# Patient Record
Sex: Female | Born: 1955 | Race: White | Hispanic: No | Marital: Married
Health system: Midwestern US, Academic
[De-identification: ages and names within clinical notes are randomized; demographics above are authoritative.]

---

## 2016-10-30 MED ORDER — SODIUM CHLORIDE 0.9 % IV SOLP
INTRAVENOUS | 0 refills | Status: CN
Start: 2016-10-30 — End: ?

## 2016-10-30 MED ORDER — PEG-ELECTROLYTE SOLN 420 GRAM PO SOLR
0 refills | Status: DC
Start: 2016-10-30 — End: 2017-02-01

## 2016-11-01 ENCOUNTER — Ambulatory Visit: Admit: 2016-11-01 | Discharge: 2016-11-01 | Payer: MEDICARE

## 2016-11-01 DIAGNOSIS — D649 Anemia, unspecified: Principal | ICD-10-CM

## 2016-11-04 ENCOUNTER — Encounter: Admit: 2016-11-04 | Discharge: 2016-11-04 | Payer: MEDICARE

## 2016-11-04 NOTE — Telephone Encounter
Received VM from [place].     Pt scheduled for Capsule endoscopy 11/06/16 and does not have any insurance or money to pay for procedure. Financial office requesting to know if procedure is emergent/urgent in order to proceed.     Routing to Dr. Caesar Chestnut to advise.

## 2016-11-05 NOTE — Telephone Encounter
Capsule endoscopy could be postponed to the next 3 months.

## 2016-11-05 NOTE — Telephone Encounter
Received notice from financial services stating pt will need to cancel/reschedule. Cancellation form sent to endoscopy lab.     Spoke to pt who states she does not have the money to get insurance at this time. Transferred pt to IM Social Worker to help further assist. Advised pt to contact our office with any questions/concerns and if symptomatic.           From: Susa Day   Sent: Tuesday, November 05, 2016 10:10 AM  To: Despina Hick  Subject: RE: MRN [id number]    Patient is unable to pay for testing tomorrow. We will have to have her cancelled or rescheduled until she has active insurance or can make payment arrangements. Thanks again for all your assistance!    Albertina Senegal   Music therapist  Patient Financial Services  [facility]  Office: [phone]  FAX: [phone]  e-mail: [email or web address]

## 2016-11-05 NOTE — Telephone Encounter
Message sent to financial office.

## 2016-11-06 ENCOUNTER — Encounter: Admit: 2016-11-06 | Discharge: 2016-11-06 | Payer: MEDICARE

## 2016-11-06 NOTE — Telephone Encounter
11-05-16  VM received from pt requesting a return call to discuss asst.  Pt states she's uninsured.    11-06-16  Return call placed.  Pt reports being uninsured and needs a capsule test but cannot afford the $2,000 required for this.  This worker inquired about work/disability options.  Pt confirms receiving disability and states she's not eligible for Medicaid.  Pt later states she declined Medicaid due to the spend down being too high ($8,000 to $10,000) and they could not afford to pay this.  This worker briefly explained the spend down process to pt and encouraged she reapply for Medicaid in hopes this will help her in receiving the required tests.  Pt reluctantly accepted this worker mailing her a Medicaid application to reapply.  Agreed.  This worker inquired pt about her Medicare eligibility date.  Pt states she's not sure when she'd be Medicare eligible and this worker encouraged pt to contact [employer] for an exact date.  Agreed.  This worker also encouraged pt to think about purchasing insurance through the [employer] however pt denied being able to afford this as well.  No additional needs.  Call concluded.    [place] Medicaid application, billing/financial counselor contact info, and [employer] info mailed to pt for asst as needed.

## 2016-11-07 ENCOUNTER — Encounter: Admit: 2016-11-07 | Discharge: 2016-11-07 | Payer: MEDICARE

## 2016-11-07 DIAGNOSIS — R112 Nausea with vomiting, unspecified: ICD-10-CM

## 2016-11-07 DIAGNOSIS — D509 Iron deficiency anemia, unspecified: Principal | ICD-10-CM

## 2016-11-07 LAB — FOLATE, SERUM: Lab: 17 MMOL/L (ref 21–30)

## 2016-11-18 ENCOUNTER — Encounter: Admit: 2016-11-18 | Discharge: 2016-11-18 | Payer: MEDICARE

## 2016-11-18 DIAGNOSIS — D509 Iron deficiency anemia, unspecified: Principal | ICD-10-CM

## 2016-11-18 DIAGNOSIS — R112 Nausea with vomiting, unspecified: ICD-10-CM

## 2016-11-29 ENCOUNTER — Encounter: Admit: 2016-11-29 | Discharge: 2016-11-29 | Payer: MEDICARE

## 2016-11-29 NOTE — Telephone Encounter
Contacted pt on information below. Pt verbalized understanding. Pt requested this information be sent to her PCP, Dr. Nonah Mattes. On review, these results have already be sent to PCP on 11/05/16. Pt also requesting ordered diagnostic test to be sent to PCP due to financial hardship. See telephone encounter 11/06/16.  Per pt, PCP has more resources to help pay. Last office visit 50-30-18 faxed to PCP office for review. Pt had no further questions or concerns.       Diane Blase, MD sent to Diane Savage            Normal folate, B12 and negative tTG (no celiac disease).

## 2017-01-31 DIAGNOSIS — I214 Non-ST elevation (NSTEMI) myocardial infarction: Secondary | ICD-10-CM

## 2017-01-31 DIAGNOSIS — J9602 Acute respiratory failure with hypercapnia: Secondary | ICD-10-CM

## 2017-01-31 MED ORDER — INSULIN ASPART 100 UNIT/ML SC FLEXPEN
0-7 [IU] | Freq: Before meals | SUBCUTANEOUS | 0 refills | Status: DC
Start: 2017-01-31 — End: 2017-02-03
  Administered 2017-02-01: 02:00:00 2 [IU] via SUBCUTANEOUS

## 2017-01-31 MED ORDER — PANTOPRAZOLE 40 MG IV SOLR
80 mg | Freq: Two times a day (BID) | INTRAVENOUS | 0 refills | Status: DC
Start: 2017-01-31 — End: 2017-02-06
  Administered 2017-02-01 – 2017-02-06 (×10): 80 mg via INTRAVENOUS

## 2017-01-31 MED ORDER — VANCOMYCIN 1,500 MG IVPB
1500 mg | INTRAVENOUS | 0 refills | Status: DC
Start: 2017-01-31 — End: 2017-02-01

## 2017-01-31 MED ORDER — PIPERACILLIN-TAZOBACTAM-DEXTRS 3.375 GRAM/50 ML IV PGBK
3.375 g | INTRAVENOUS | 0 refills | Status: DC
Start: 2017-01-31 — End: 2017-02-02
  Administered 2017-02-01 – 2017-02-02 (×6): 3.375 g via INTRAVENOUS

## 2017-01-31 MED ORDER — DOXYCYCLINE HYCLATE 100 MG PO TAB
100 mg | Freq: Two times a day (BID) | ORAL | 0 refills | Status: DC
Start: 2017-01-31 — End: 2017-02-01
  Administered 2017-02-01 (×2): 100 mg via ORAL

## 2017-01-31 MED ORDER — FUROSEMIDE 10 MG/ML IJ SOLN
40 mg | Freq: Once | INTRAVENOUS | 0 refills | Status: CP
Start: 2017-01-31 — End: ?
  Administered 2017-02-01: 07:00:00 40 mg via INTRAVENOUS

## 2017-01-31 MED ORDER — VANCOMYCIN 1,750 MG IVPB
1750 mg | INTRAVENOUS | 0 refills | Status: DC
Start: 2017-01-31 — End: 2017-02-01
  Administered 2017-02-01 (×2): 1750 mg via INTRAVENOUS

## 2017-01-31 MED ORDER — NITROGLYCERIN IN 5 % DEXTROSE 50 MG/250 ML (200 MCG/ML) IV SOLN
0.1-3 ug/kg/min | INTRAVENOUS | 0 refills | Status: DC
Start: 2017-01-31 — End: 2017-02-04

## 2017-01-31 MED ORDER — VANCOMYCIN PHARMACY TO MANAGE
1 | 0 refills | Status: DC
Start: 2017-01-31 — End: 2017-02-01

## 2017-01-31 MED ORDER — SODIUM PHOSPHATE IVPB
8 MMOL | Freq: Once | INTRAVENOUS | 0 refills | Status: CP
Start: 2017-01-31 — End: ?
  Administered 2017-02-01 (×2): 8 mmol via INTRAVENOUS

## 2017-01-31 MED ORDER — HEPARIN, PORCINE (PF) 5,000 UNIT/0.5 ML IJ SYRG
5000 [IU] | SUBCUTANEOUS | 0 refills | Status: DC
Start: 2017-01-31 — End: 2017-02-01

## 2017-01-31 MED ORDER — AMIODARONE 200 MG PO TAB
400 mg | Freq: Two times a day (BID) | ORAL | 0 refills | Status: DC
Start: 2017-01-31 — End: 2017-02-01
  Administered 2017-02-01: 06:00:00 400 mg via ORAL

## 2017-02-01 ENCOUNTER — Encounter: Admit: 2017-02-01 | Discharge: 2017-02-01 | Payer: MEDICARE

## 2017-02-01 DIAGNOSIS — I10 Essential (primary) hypertension: ICD-10-CM

## 2017-02-01 DIAGNOSIS — M1990 Unspecified osteoarthritis, unspecified site: ICD-10-CM

## 2017-02-01 DIAGNOSIS — I2510 Atherosclerotic heart disease of native coronary artery without angina pectoris: Principal | ICD-10-CM

## 2017-02-01 DIAGNOSIS — E119 Type 2 diabetes mellitus without complications: ICD-10-CM

## 2017-02-01 DIAGNOSIS — K319 Disease of stomach and duodenum, unspecified: ICD-10-CM

## 2017-02-01 DIAGNOSIS — I214 Non-ST elevation (NSTEMI) myocardial infarction: Secondary | ICD-10-CM

## 2017-02-01 DIAGNOSIS — D699 Hemorrhagic condition, unspecified: ICD-10-CM

## 2017-02-01 DIAGNOSIS — E039 Hypothyroidism, unspecified: ICD-10-CM

## 2017-02-01 DIAGNOSIS — M549 Dorsalgia, unspecified: ICD-10-CM

## 2017-02-01 DIAGNOSIS — H547 Unspecified visual loss: ICD-10-CM

## 2017-02-01 LAB — CBC
Lab: 160 K/UL (ref 150–400)
Lab: 17 % — ABNORMAL HIGH (ref 11–15)
Lab: 26 % — ABNORMAL LOW (ref >60)
Lab: 30 pg — ABNORMAL LOW (ref 26–34)
Lab: 32 g/dL — ABNORMAL HIGH (ref 32.0–36.0)
Lab: 7.9 FL (ref 7–11)
Lab: 8.1 K/UL — ABNORMAL LOW (ref 4.5–11.0)
Lab: 8.4 g/dL — ABNORMAL LOW (ref 12.0–15.0)
Lab: 94 FL (ref >60)

## 2017-02-01 LAB — POC GLUCOSE
Lab: 234 mg/dL — ABNORMAL HIGH (ref 70–100)
Lab: 186 mg/dL — ABNORMAL HIGH (ref 70–100)
Lab: 221 mg/dL — ABNORMAL HIGH (ref 70–100)
Lab: 376 mg/dL — ABNORMAL HIGH (ref 70–100)

## 2017-02-01 LAB — CREATININE-URINE RANDOM: Lab: 58 mg/dL

## 2017-02-01 LAB — COMPREHENSIVE METABOLIC PANEL
Lab: 130 MMOL/L — ABNORMAL LOW (ref 137–147)
Lab: 24 MMOL/L (ref 21–30)
Lab: 26 U/L (ref 7–40)
Lab: 29 mL/min — ABNORMAL LOW (ref >60)
Lab: 3.2 g/dL — ABNORMAL LOW (ref 3.5–5.0)
Lab: 35 mL/min — ABNORMAL LOW (ref >60)
Lab: 54 U/L (ref 25–110)
Lab: 6 K/UL — ABNORMAL HIGH (ref 3–12)
Lab: 8.9 mg/dL (ref 8.5–10.6)
Lab: 9 U/L (ref 7–56)
Lab: 133 MMOL/L — ABNORMAL LOW (ref 137–147)

## 2017-02-01 LAB — URINALYSIS MICROSCOPIC REFLEX TO CULTURE

## 2017-02-01 LAB — BLOOD GASES, CENTRAL VENOUS
Lab: 1 MMOL/L
Lab: 25 MMOL/L
Lab: 49 mmHg (ref 40–50)
Lab: 52 mmHg (ref >40)
Lab: 7.3 (ref 7.30–7.40)
Lab: 84 % — ABNORMAL HIGH (ref 65–75)

## 2017-02-01 LAB — HEMOGLOBIN A1C: Lab: 7.1 % — ABNORMAL HIGH (ref 4.0–6.0)

## 2017-02-01 LAB — TROPONIN-I
Lab: 0.7 ng/mL — ABNORMAL HIGH (ref 0.0–0.05)
Lab: 0.8 ng/mL — ABNORMAL HIGH (ref 0.0–0.05)
Lab: 0.9 ng/mL — ABNORMAL HIGH (ref 0.0–0.05)
Lab: 0.9 ng/mL — ABNORMAL HIGH (ref 0.0–0.05)
Lab: 0.9 ng/mL — ABNORMAL HIGH (ref 0.0–0.05)

## 2017-02-01 LAB — LIPID PROFILE: Lab: 169 mg/dL — ABNORMAL LOW (ref <200)

## 2017-02-01 LAB — MAGNESIUM
Lab: 2 mg/dL (ref 1.6–2.6)
Lab: 1.8 mg/dL — ABNORMAL LOW (ref 1.6–2.6)

## 2017-02-01 LAB — LACTIC ACID(LACTATE): Lab: 1.3 MMOL/L (ref 0.5–2.0)

## 2017-02-01 LAB — BLOOD GASES, PERIPHERAL VENOUS
Lab: 0.3 MMOL/L
Lab: 24 MMOL/L
Lab: 36 mmHg (ref 33–48)
Lab: 61 % (ref 55–71)
Lab: 64 mmHg — ABNORMAL HIGH (ref 36–50)
Lab: 7.2 — ABNORMAL LOW (ref 7.30–7.40)

## 2017-02-01 LAB — PTT (APTT)
Lab: 24 s — ABNORMAL LOW (ref 20.0–36.0)
Lab: 26 s (ref 20.0–36.0)

## 2017-02-01 LAB — PHOSPHORUS
Lab: 1.8 mg/dL — ABNORMAL LOW (ref 2.0–4.0)
Lab: 1.8 mg/dL — ABNORMAL LOW (ref 2.0–4.0)

## 2017-02-01 LAB — CBC AND DIFF
Lab: 8.9 10*3/uL (ref 4.5–11.0)
Lab: 7.9 K/UL — ABNORMAL HIGH (ref >60)

## 2017-02-01 LAB — URINALYSIS DIPSTICK REFLEX TO CULTURE

## 2017-02-01 LAB — UREA NITROGEN-URINE RANDOM: Lab: 159 mg/dL

## 2017-02-01 LAB — LACTIC ACID (BG - RAPID LACTATE): Lab: 1.5 MMOL/L (ref 0.5–2.0)

## 2017-02-01 LAB — FREE T4-FREE THYROXINE: Lab: 1.3 ng/dL (ref 0.6–1.6)

## 2017-02-01 LAB — BNP (B-TYPE NATRIURETIC PEPTI): Lab: 117 pg/mL — ABNORMAL HIGH (ref 0–100)

## 2017-02-01 LAB — TSH WITH FREE T4 REFLEX: Lab: 0.2 uU/mL — ABNORMAL LOW (ref 0.35–5.00)

## 2017-02-01 LAB — PROTIME INR (PT): Lab: 1.2 M/UL — ABNORMAL LOW (ref 0.8–1.2)

## 2017-02-01 MED ORDER — ACETAMINOPHEN 325 MG PO TAB
650 mg | Freq: Once | ORAL | 0 refills | Status: CP
Start: 2017-02-01 — End: ?
  Administered 2017-02-01: 10:00:00 650 mg via ORAL

## 2017-02-01 MED ORDER — DIGOXIN 250 MCG/ML IJ SOLN
250 ug | Freq: Once | INTRAVENOUS | 0 refills | Status: CP
Start: 2017-02-01 — End: ?
  Administered 2017-02-01: 12:00:00 250 ug via INTRAVENOUS

## 2017-02-01 MED ORDER — AMIODARONE 150 MG IN D5W 100 ML IVPB LOAD
150 mg | Freq: Once | INTRAVENOUS | 0 refills | Status: CP
Start: 2017-02-01 — End: ?
  Administered 2017-02-01 (×2): 150 mg via INTRAVENOUS

## 2017-02-01 MED ORDER — FUROSEMIDE 10 MG/ML IJ SOLN
40 mg | Freq: Once | INTRAVENOUS | 0 refills | Status: CP
Start: 2017-02-01 — End: ?
  Administered 2017-02-01: 16:00:00 40 mg via INTRAVENOUS

## 2017-02-01 MED ORDER — PNEUMOCOCCAL 23-VAL PS VACCINE 25 MCG/0.5 ML IJ SOLN
.5 mL | Freq: Once | INTRAMUSCULAR | 0 refills | Status: DC
Start: 2017-02-01 — End: 2017-02-04

## 2017-02-01 MED ORDER — ONDANSETRON HCL 4 MG PO TAB
4 mg | ORAL | 0 refills | Status: DC | PRN
Start: 2017-02-01 — End: 2017-02-04
  Administered 2017-02-02: 04:00:00 4 mg via ORAL

## 2017-02-01 MED ORDER — FUROSEMIDE IV DRIP SYR (MAX CONC)
5-40 mg/h | INTRAVENOUS | 0 refills | Status: DC
Start: 2017-02-01 — End: 2017-02-01
  Administered 2017-02-01: 09:00:00 5 mg/h via INTRAVENOUS

## 2017-02-01 MED ORDER — MAGNESIUM SULFATE IN D5W 1 GRAM/100 ML IV PGBK
1 g | Freq: Once | INTRAVENOUS | 0 refills | Status: CP
Start: 2017-02-01 — End: ?
  Administered 2017-02-01: 14:00:00 1 g via INTRAVENOUS

## 2017-02-01 MED ORDER — ASPIRIN 81 MG PO CHEW
81 mg | Freq: Every day | ORAL | 0 refills | Status: DC
Start: 2017-02-01 — End: 2017-02-04
  Administered 2017-02-01 – 2017-02-04 (×4): 81 mg via ORAL

## 2017-02-01 MED ORDER — HEPARIN (PORCINE) IN 5 % DEX 20,000 UNIT/500 ML (40 UNIT/ML) IV SOLP
0-2000 [IU]/h | INTRAVENOUS | 0 refills | Status: DC
Start: 2017-02-01 — End: 2017-02-02
  Administered 2017-02-01: 11:00:00 800 [IU]/h via INTRAVENOUS
  Administered 2017-02-02: 11:00:00 1000 [IU]/h via INTRAVENOUS

## 2017-02-01 MED ORDER — IMS MIXTURE TEMPLATE
175 ug | Freq: Every day | ORAL | 0 refills | Status: DC
Start: 2017-02-01 — End: 2017-02-16
  Administered 2017-02-02 – 2017-02-15 (×19): 175 ug via ORAL

## 2017-02-01 MED ORDER — ROSUVASTATIN 20 MG PO TAB
20 mg | Freq: Every day | ORAL | 0 refills | Status: DC
Start: 2017-02-01 — End: 2017-02-03
  Administered 2017-02-01 – 2017-02-03 (×2): 20 mg via ORAL

## 2017-02-01 MED ORDER — ACETAMINOPHEN 325 MG PO TAB
650 mg | ORAL | 0 refills | Status: DC | PRN
Start: 2017-02-01 — End: 2017-02-04
  Administered 2017-02-02: 02:00:00 650 mg via ORAL

## 2017-02-01 MED ORDER — METOPROLOL TARTRATE 5 MG/5 ML IV SOLN
5 mg | Freq: Once | INTRAVENOUS | 0 refills | Status: CP
Start: 2017-02-01 — End: ?

## 2017-02-01 MED ORDER — LEVOTHYROXINE 75 MCG PO TAB
225 ug | Freq: Every day | ORAL | 0 refills | Status: DC
Start: 2017-02-01 — End: 2017-02-01
  Administered 2017-02-01: 12:00:00 225 ug via ORAL

## 2017-02-01 MED ORDER — PERFLUTREN LIPID MICROSPHERES 1.1 MG/ML IV SUSP
1-20 mL | Freq: Once | INTRAVENOUS | 0 refills | Status: CP
Start: 2017-02-01 — End: ?
  Administered 2017-02-01: 17:00:00 2 mL via INTRAVENOUS

## 2017-02-01 MED ORDER — ACETAMINOPHEN 500 MG PO TAB
500 mg | Freq: Once | ORAL | 0 refills | Status: CP
Start: 2017-02-01 — End: ?
  Administered 2017-02-01: 12:00:00 500 mg via ORAL

## 2017-02-01 MED ORDER — AMIODARONE IN DEXTROSE,ISO-OSM 360 MG/200 ML (1.8 MG/ML) IV SOLN
0.5-1 mg/min | INTRAVENOUS | 0 refills | Status: DC
Start: 2017-02-01 — End: 2017-02-03
  Administered 2017-02-01 (×2): 1 mg/min via INTRAVENOUS
  Administered 2017-02-02 – 2017-02-03 (×3): 0.5 mg/min via INTRAVENOUS

## 2017-02-01 MED ORDER — PEG-ELECTROLYTE SOLN 420 GRAM PO SOLR
2 L | Freq: Once | ORAL | 0 refills | Status: CP
Start: 2017-02-01 — End: ?
  Administered 2017-02-02: 04:00:00 2 L via ORAL

## 2017-02-01 MED ADMIN — METOPROLOL TARTRATE 5 MG/5 ML IV SOLN [5007]: 5 mg | INTRAVENOUS | @ 11:00:00 | Stop: 2017-02-01 | NDC 47781058720

## 2017-02-01 NOTE — H&P (View-Only)
Admission History and Physical Examination      Name:  Diane Savage                                             MRN:  [id number]   Admission Date:  01/31/2017                     Assessment/Plan:    Active Problems:    NSTEMI (non-ST elevated myocardial infarction) (HCC)    Type 2 diabetes mellitus with circulatory disorder, without long-term current use of insulin (HCC)    Atrial fibrillation (HCC)    Atrial fibrillation with rapid ventricular response (HCC)    Chronic gastrointestinal bleeding    Transfusion-dependent anemia    61 yoF with a PMH paroxysmal atrial fibrillation not on AC, HTN, HLD, NIDDM, Chronic blood loss anemia, transfusion dependent anemia, CAD s/p PCI w DES ?year, hypothyroidism, presented [facility] with c/o chest pain found Hgb ~6, with positive troponin and concern for NSTEMI 2/2 demand ischemia from chronic CAD    #NSTEMI, likely demand ischemia due to anemia and existing CAD  #CAD with hx PCI with DES  #HLD  -presented 8/30 to [facility] with c/o chest pain; hgb at time of admission noted to be 6.4  -initial troponin found to be ~0.3, patient given ASA on admission, but no heparin gtt given hx GIB  -patient taken for [facility] 01/31/17 with findings of multi-vessel CAD: 80% distal left main, moderate LAD disease, LCx 80% ostial 70%; RCA with 50% lesion and distal 70% disease. Aortic pressure 104/52, LVp 109/28, LVEDP , normal LV function  -given severity/multi-vessel of disease, request was made for transfer to tertiary center for CABG evaluation  -on arrival patient on amiodarone gtt, heparin gtt, nitro gtt  PLAN  >heparin gtt  >continue nitro gtt / amiodarone gtt  >repeat trop q4h   >obtain EKG, BMP, Mg, Phos  >Echocardiogram  >CTS consultation    #Fever  #Somnolence  -on arrival, patient somnolent, and febrile to 101.6*f  PLAN  >UA, BCx2, CXR  >Lactic Acid  >VBG  >zosyn, doxy, vanc  >de-escalate abx if initial findings are negative #Acute Kidney Injury  -Cr at time of presentation 1.8  -Cr per review of records ~1.0  PLAN  >monitor with BMP with diuresis  >d/c vanc/zosyn when possible to prevent further injury    #Atrial fibrillation with RVR  -pt has history of a.fib, not on AC due to chronic GIB  -8/31 patient noted on telemetry to enter a.fib with RVR, HR ~150  -amiodarone gtt initiated, patient spontaneously converted to NSR  -CHADsVASc  PLAN  >heparin gtt, check cbc q8h while on drip  >continue amiodarone gtt     #Acute on Chronic Blood Loss Anemia  #Transfusion dependent anemia  -patient has extensive history of GI blood loss anemia, exacerbated after placed on Plavix and ASA following PCI w DES: reported BRBPR and Melena  -per records, patient has undergone capsule endoscopy with reported abnormal findings per chart review  -colonscopy per patient has been unrevealing  -hematology has seen in past: normal retic count, iron deficiency anemia  -has required 2units pRBC tranfusion every 2 weeks for ~18yr, patient reportedly awaiting port placement due to transfusion dependence  -at OSH: received 3 units pRBC with Hgb > 8.0  -Hgb at time of presentation 7.3  PLAN  >using AC with caution given NSTEMI and A.fib  >trend Hgb q8h. Transfuse to Hgb goal > 8  >antibody screen on blood - family reports difficulty finding a blood match because she has had so many transfusions  >consider hematology/pathology consult for Ab determination  >IV Pantoprazole 80mg  BID  >consulting gastroenterology    #Type 2 DM  >repeat A1c  >LDCF    Hypothyroidism  >Restart PTA synthroid    #FENI: no IVF, cardiac diet when awake  #PPx PPI, hold off on [facility] given GIB  #Dispo CV1 service, evaluation for CABG  #Code full    __________________________________________________________________________________  Primary Care Physician: Sherre Lain  Verified    Chief Complaint:    History of Present Illness: Diane Savage is a 61 y.o. female with PMH chronic diastolic HF, a.fib, T2DM, HTN, HLD, CAD s/p PCI w DES, hypothyroidism, presented to [facility] in [place], [place] on 8/29 with complaint of 2 weeks of off/on chest pain. When she awoke this AM pain was persistent and 10/10. Pain located in left chest with radiation towards neck. Patient reported to ED where they initially discovered that her hemoglobin was ~6. She was emergently transfused 2 units of pRBC and admitted for further evaluation. Initial troponin was negative, but repeat showed mild elevation to 0.35. She was given ASA, but no heparin given her extensive history of GI Bleeding and anemia. Cardiology was consulted while inpatient and they recommended LHC, given the possibility of plaque rupture. LHC showed areas of no more than 80% stenosis, which led to the suspicion of demand ischemia from anemia. While in [facility], patient was bolused with heparin. Her hospital course was complicated by A.fib with RVR, which was treated with amiodarone. Telemetry reports that were brought with the patient showed HR ~150. She was transferred to [facility] for CABG evaluation.    At time of arrival patient was on heparin gtt, nitro gtt, and amiodarone gtt. She was somnolent and difficult to rouse. With gentle sternal rub, patient could briefly open her eyes for around 4 seconds. She was unable to relay her pertinent past medical history. She was able to deny chest pain. Review of records show administration of narcotics and benzos PTA.    Family was present at bedside and was able to relay some of her history. They report that she has had problems with GIB for over 46yr. They state that she has required 2 units of blood every other week. There were plans to place a port on the patient to made administration easier. Her anemia has been worked up with capsule studies, colonscopies, but the family reports no definitive cause was provided to them.        Past Medical History:   Diagnosis Date ??? Acquired hypothyroidism    ??? Arthritis    ??? Back pain    ??? Bleeding disorder (HCC)    ??? Coronary artery disease    ??? Diabetes mellitus (HCC)     Type II   ??? Hypertension    ??? Stomach disorder    ??? Vision decreased      Past Surgical History:   Procedure Laterality Date   ??? HX HEART CATHETERIZATION  2017   ??? CORONARY STENT PLACEMENT  2017   ??? ANKLE SURGERY      Left ankle reconstruction   ??? COLONOSCOPY     ??? HX KNEE ARTHROSCOPY Left    ??? HX TONSIL AND ADENOIDECTOMY     ??? TUBAL LIGATION     ???  UPPER GASTROINTESTINAL ENDOSCOPY       Family History   Problem Relation Age of Onset   ??? Diabetes Mother    ??? Heart Disease Mother    ??? High Cholesterol Mother    ??? Arthritis-rheumatoid Mother    ??? Stroke Mother    ??? Thyroid Disease Mother    ??? Coronary Artery Disease Mother    ??? Hyperlipidemia Mother    ??? Cancer Father    ??? Cancer-Breast Sister    ??? Cancer Sister    ??? Hypertension Sister    ??? Heart Disease Sister    ??? High Cholesterol Sister    ??? Arthritis-osteo Sister    ??? Migraines Sister    ??? Rashes/Skin Problems Sister    ??? Depression Sister    ??? Hyperlipidemia Sister    ??? Cancer Brother    ??? Hypertension Brother    ??? Arthritis-rheumatoid Brother    ??? Rashes/Skin Problems Brother    ??? Coronary Artery Disease Brother    ??? Heart Disease Maternal Aunt    ??? Coronary Artery Disease Paternal Uncle    ??? Hyperlipidemia Paternal Uncle    ??? Cancer Maternal Grandmother    ??? Diabetes Maternal Grandmother    ??? Heart Disease Maternal Grandmother    ??? Cancer-Colon Paternal Grandmother    ??? Cancer Paternal Grandmother    ??? Diabetes Paternal Grandmother    ??? Cancer Paternal Grandfather      Social History     Social History   ??? Marital status: Married     Spouse name: N/A   ??? Number of children: N/A   ??? Years of education: N/A     Social History Main Topics   ??? Smoking status: Former Smoker     Packs/day: 2.00     Years: 10.00     Types: Cigarettes     Quit date: 09/16/1980   ??? Smokeless tobacco: Never Used   ??? Alcohol use Yes Comment: Seldom   ??? Drug use: No   ??? Sexual activity: Not on file     Other Topics Concern   ??? Not on file     Social History Narrative   ??? No narrative on file      Immunizations (includes history and patient reported):   There is no immunization history on file for this patient.        Allergies:  Diltiazem; Sulfa (sulfonamide antibiotics); Duricef [cefadroxil]; Pravastatin; and Simvastatin    Medications:  Prescriptions Prior to Admission   Medication Sig   ??? CETIRIZINE HCL (ZYRTEC PO) Take 1 tablet by mouth as Needed.   ??? electrolyte GUT PEG (NULYTELY, COLYTE, GAVILYTE-N) 420 gram oral solution 2000 mls as directed for capsule endoscopy   ??? enalapril (VASOTEC) 5 mg tablet Take 5 mg by mouth daily.   ??? folic acid (FOLVITE) 1 mg tablet Take 1 mg by mouth daily.   ??? furosemide (LASIX) 20 mg tablet Take 20 mg by mouth every morning.   ??? glimepiride (AMARYL) 2 mg tablet Take 2 mg by mouth twice daily.   ??? GLUCOSAMINE HCL/CHONDROITIN SU (GLUCOSAMINE-CHONDROITIN PO) Take 1 tablet by mouth twice daily.   ??? levothyroxine (SYNTHROID) 200 mcg tablet Take 200 mcg by mouth daily 30 minutes before breakfast.   ??? levothyroxine (SYNTHROID) 25 mcg tablet Take 25 mcg by mouth daily 30 minutes before breakfast.   ??? LOPERAMIDE HCL (ANTI-DIARRHEA PO) Take 1 tablet by mouth as Needed.   ??? metFORMIN (GLUCOPHAGE) 500  mg tablet Take 1,000 mg by mouth twice daily with meals.   ??? metoprolol XL (TOPROL XL) 100 mg extended release tablet Take 100 mg by mouth twice daily.   ??? nitroglycerin (NITROSTAT) 0.4 mg tablet Place 0.4 mg under tongue every 5 minutes as needed for Chest Pain. Max of 3 tablets, call 911.   ??? omeprazole DR(+) (PRILOSEC) 40 mg capsule Take 40 mg by mouth twice daily.   ??? ondansetron (ZOFRAN) 8 mg tablet Take 8 mg by mouth every 8 hours as needed for Nausea or Vomiting.   ??? promethazine (PHENERGAN) 25 mg tablet Take 25 mg by mouth every 6 hours as needed for Nausea or Vomiting. ??? sucralfate (CARAFATE) 1 gram tablet Take 1 g by mouth four times daily. Take on an empty stomach.   ??? traMADol (ULTRAM) 50 mg tablet Take 50 mg by mouth every 6 hours as needed for Pain.     Review of Systems:  Review of systems not obtained from patient due to patient factors.      Physical Exam:  Vital Signs: Last Filed In 24 Hours Vital Signs: 24 Hour Range   BP: 110/52 (08/31 1928)  Temp: 38.7 ???C (101.6 ???F) (08/31 1924)  Pulse: 96 (08/31 1928)  Respirations: 18 PER MINUTE (08/31 1928)  SpO2: 99 % (08/31 1928)  O2 Delivery: Nasal Cannula (08/31 1928)  SpO2 Pulse: 97 (08/31 1928)  Height: 162.6 cm (64) (08/31 1944) BP: (110-131)/(35-52)   Temp:  [38.7 ???C (101.6 ???F)]   Pulse:  [96-97]   Respirations:  [13 PER MINUTE-18 PER MINUTE]   SpO2:  [98 %-99 %]   O2 Delivery: Nasal Cannula          Physical Exam   Constitutional: Vital signs are normal. She appears to not be writhing in pain. She has a sickly appearance.   Somnolent and difficult to rouse   HENT:   Head: Normocephalic and atraumatic.   Mouth/Throat: No oropharyngeal exudate.   Eyes: EOM are normal. Pupils are equal, round, and reactive to light. No scleral icterus.   Neck: No tracheal deviation present.   Plethoric neck with extra skin   Cardiovascular: Normal rate and regular rhythm.  Exam reveals no gallop.    Murmur heard.  Pulmonary/Chest: Effort normal. She exhibits no tenderness.   Abdominal: Soft. Bowel sounds are normal. She exhibits no distension. There is tenderness.   Musculoskeletal: She exhibits edema.   BLLE 1/2+ edema  BUE 1+ edema   Skin: Skin is warm and dry. No rash noted. No erythema.     Update to physical exam once patient awoke:  Constitutional: no distress  Psych: affect and judgement nl  Neuro: alert, oriented x3; no cranial nerve deficits  Abd: not tender to palpation      Lab/Radiology/Other Diagnostic Tests:  24-hour labs:    Results for orders placed or performed during the hospital encounter of 01/31/17 (from the past 24 hour(s))   CBC AND DIFF    Collection Time: 01/31/17  8:18 PM   Result Value Ref Range    White Blood Cells 8.9 4.5 - 11.0 K/UL    RBC 2.42 (L) 4.0 - 5.0 M/UL    Hemoglobin 7.3 (L) 12.0 - 15.0 GM/DL    Hematocrit 16.1 (L) 36 - 45 %    MCV 93.1 80 - 100 FL    MCH 30.0 26 - 34 PG    MCHC 32.2 32.0 - 36.0 G/DL    RDW 09.6 (H) 11 -  15 %    Platelet Count 164 150 - 400 K/UL    MPV 8.1 7 - 11 FL    Neutrophils 75 41 - 77 %    Lymphocytes 12 (L) 24 - 44 %    Monocytes 11 4 - 12 %    Eosinophils 1 0 - 5 %    Basophils 1 0 - 2 %    Absolute Neutrophil Count 6.70 1.8 - 7.0 K/UL    Absolute Lymph Count 1.00 1.0 - 4.8 K/UL    Absolute Monocyte Count 1.00 (H) 0 - 0.80 K/UL    Absolute Eosinophil Count 0.10 0 - 0.45 K/UL    Absolute Basophil Count 0.00 0 - 0.20 K/UL   PROTIME INR (PT)    Collection Time: 01/31/17  8:18 PM   Result Value Ref Range    INR 1.2 0.8 - 1.2   PTT (APTT)    Collection Time: 01/31/17  8:18 PM   Result Value Ref Range    APTT 24.9 20.0 - 36.0 SEC   COMPREHENSIVE METABOLIC PANEL    Collection Time: 01/31/17  8:18 PM   Result Value Ref Range    Sodium 130 (L) 137 - 147 MMOL/L    Potassium 4.3 3.5 - 5.1 MMOL/L    Chloride 100 98 - 110 MMOL/L    Glucose 199 (H) 70 - 100 MG/DL    Blood Urea Nitrogen 20 7 - 25 MG/DL    Creatinine 1.61 (H) 0.4 - 1.00 MG/DL    Calcium 8.9 8.5 - 09.6 MG/DL    Total Protein 6.4 6.0 - 8.0 G/DL    Total Bilirubin 0.8 0.3 - 1.2 MG/DL    Albumin 3.2 (L) 3.5 - 5.0 G/DL    Alk Phosphatase 54 25 - 110 U/L    AST (SGOT) 26 7 - 40 U/L    CO2 24 21 - 30 MMOL/L    ALT (SGPT) 9 7 - 56 U/L    Anion Gap 6 3 - 12    eGFR Non African American 29 (L) >60 mL/min    eGFR African American 35 (L) >60 mL/min   MAGNESIUM    Collection Time: 01/31/17  8:18 PM   Result Value Ref Range    Magnesium 2.0 1.6 - 2.6 mg/dL   PHOSPHORUS    Collection Time: 01/31/17  8:18 PM   Result Value Ref Range    Phosphorus 1.8 (L) 2.0 - 4.0 MG/DL   BNP (B-TYPE NATRIURETIC PEPTI) Collection Time: 01/31/17  8:18 PM   Result Value Ref Range    B Type Natriuretic Peptide 1,178.0 (H) 0 - 100 PG/ML   TROPONIN-I    Collection Time: 01/31/17  8:18 PM   Result Value Ref Range    Troponin-I 0.76 (H) 0.0 - 0.05 NG/ML   TSH WITH FREE T4 REFLEX    Collection Time: 01/31/17  8:18 PM   Result Value Ref Range    TSH 0.250 (L) 0.35 - 5.00 MCU/ML   FREE T4-FREE THYROXINE    Collection Time: 01/31/17  8:18 PM   Result Value Ref Range    T4-Free 1.3 0.6 - 1.6 NG/DL   POC GLUCOSE    Collection Time: 01/31/17  8:19 PM   Result Value Ref Range    Glucose, POC 234 (H) 70 - 100 MG/DL   URINALYSIS DIPSTICK REFLEX TO CULTURE    Collection Time: 01/31/17  9:07 PM   Result Value Ref Range    Color,UA YELLOW  Turbidity,UA CLEAR CLEAR-CLEAR    Specific Gravity-Urine 1.044 (H) 1.003 - 1.035    pH,UA 5.0 5.0 - 8.0    Protein,UA NEG NEG-NEG    Glucose,UA NEG NEG-NEG    Ketones,UA NEG NEG-NEG    Bilirubin,UA NEG NEG-NEG    Blood,UA 1+ (A) NEG-NEG    Urobilinogen,UA NORMAL NORM-NORMAL    Nitrite,UA NEG NEG-NEG    Leukocytes,UA 3+ (A) NEG-NEG    Urine Ascorbic Acid, UA NEG NEG-NEG   URINALYSIS MICROSCOPIC REFLEX TO CULTURE    Collection Time: 01/31/17  9:07 PM   Result Value Ref Range    WBCs,UA 10-20 0 - 2 /HPF    RBCs,UA 2-10 0 - 3 /HPF    Comment,UA       Urine submitted for reflex culture if criteria are met:WBC>10, positive nitrite   and/or >=1+ leukocyte esterase. If quantity is not sufficient, an addendum will   follow.      MucousUA TRACE     Bacteria,UA MODERATE (A) NEG-NEG    Squamous Epithelial Cells 0-2 0 - 5   BLOOD GASES, PERIPHERAL VENOUS    Collection Time: 01/31/17  9:20 PM   Result Value Ref Range    pH-Venous 7.25 (L) 7.30 - 7.40    PCO2-Venous 64 (H) 36 - 50 MMHG    PO2-Venous 36 33 - 48 MMHG    Base Excess-Venous 0.3 MMOL/L    O2 Sat-Venous 61.8 55 - 71 %    Bicarbonate-VEN-Cal 24.3 MMOL/L   TYPE & CROSSMATCH    Collection Time: 01/31/17  9:20 PM   Result Value Ref Range    Units Ordered 1 Crossmatch Expires 02/03/2017     Record Check 2ND TYPE REQUIRED     ABO/RH(D) A POS     Antibody Screen POS     Antibody Indentification Anti-Jk(a),     Unit Number [id number]     Blood Component Type RBC,ADSOL,LEUKO REDUCED     Unit Division 0     Status OF Unit ISSUED     Transfusion Status OK TO TRANSFUSE     Crossmatch Result COMPATIBLE, GEL    LACTIC ACID (BG - RAPID LACTATE)    Collection Time: 01/31/17  9:20 PM   Result Value Ref Range    Lactic Acid,BG 1.5 0.5 - 2.0 MMOL/L   BLOOD TYPE CONFIRMATION - ORDER ONLY IF REQUESTED BY LAB    Collection Time: 01/31/17  9:49 PM   Result Value Ref Range    ABO/RH(D) A POS    TROPONIN-I    Collection Time: 01/31/17 11:20 PM   Result Value Ref Range    Troponin-I 0.89 (H) 0.0 - 0.05 NG/ML     Glucose: (!) 199 (01/31/17 2018)  POC Glucose (Download): (!) 234 (01/31/17 2019)  Pertinent radiology reviewed.    Billy Coast, MD  Pager [phone]

## 2017-02-01 NOTE — Progress Notes
Pharmacy Vancomycin Note  Subjective:   Diane Savage is a 61 y.o. female being treated for sepsis.    Objective:     Current Vancomycin Orders   Medication Dose Route Frequency    vancomycin (VANCOCIN) 1,750 mg in dextrose 5% (D5W) IVPB  1,750 mg Intravenous Q24H*    And    vancomycin, pharmacy to manage  1 each Service Per Pharmacy     Start Date of  Vancomycin therapy: 01/31/2017  Additional Abx: zosyn    Estimated CrCl: 41.1 mL/min  Actual Weight:  115 kg (253 lb 8.5 oz)    Assessment:   Target levels for this patient: 15-20    Plan:   1. Renal function is borderline for dosing. Will start 1750mg  every 24 hours and continue to closely monitor renal function  2. Pharmacy will continue to monitor and adjust therapy as needed.    Corliss Parish, [facility]  01/31/2017

## 2017-02-01 NOTE — Progress Notes
RESPIRATORY THERAPY  ADULT PROTOCOL EVALUATION      RESPIRATORY PROTOCOL PLAN    Medications       Note: If indicated by protocol, medication orders will be placed by therapist.    Procedures  Oxygen/Humidity: O2 to keep SpO2 > 92%  Monitoring: Pulse oximetry BID & PRN       _____________________________________________________________    PATIENT EVALUATION RESULTS    Chart Review  * Pulmonary Hx: Smoker in home OR smoking cessation > 8 weeks    * Surgical Hx: No surgery OR last surgery > 6 weeks ago OR trach/stoma (BA)    * Chest X-Ray: Clear OR not available    * PFT/Oxygenation: FEV1, PEFR < 60% OR Pa02 < 60 or Sp02 <90% RA OR Fi02 > 0.30 to keep Sp02 >92% OR Sickle Cell Crisis OR Anemia (<10) OR Acute MI (02 & oxim)      Patient Assessment  * Respiratory Pattern: Regular pattern and rate OR good chest excursion with deep breathing    * Breath Sounds: Clear apically, but diminished in bases (LE) OR CHF related crackles (02) (oximetry)    * Cough / Sputum: Strong, effective cough OR nonproductive    * Mental Status: Alert, oriented, cooperative    * Activity Level: Ambulatory with assistance      Priority Index  Total Points: 6 Points  * Priority Index: 1    PRIORITY INDEX GUIDELINES*  Priority Points   1 0-9 points   2 9-18 points   3 > 18 points   + Pulm Dx or Home Rx   *Higher points indicate higher acuity.      Therapist: Jorene Minors, RT  Date: 01/31/2017      Key  AC = Airway clearance  AM = Aerosolized medication  BA = Bland aerosol  DB&C = Deep breathe & cough  FEV1 = Forced expiratory volume in first second)  IC = Inspiratory capacity  LE = Lung expansion  MDI = Metered dose inhaler  Neb = Nebulizer  O2 = Oxygen  Oxim =Oximetry  PEFR = Peak expiratory flow rate  RRT = Rapid Response Team

## 2017-02-01 NOTE — Progress Notes
Patient arrived to room # [phone]  via cart accompanied by Ambulance staff. Patient transferred to the bed with   assistance. Bedside safety checks completed. Initial patient assessment completed, refer to flowsheet for details. Admission skin assessment completed by: This RN and Astronomer    Pressure Injury Present on Hospital Admission (within 24 hours): no    1. Occiput: no  2. Ear: no  3. Scapula: no  4. Spinous Process: no  5. Shoulder: no  6. Elbow: no  7. Iliac Crest: no  8. Sacrum/Coccyx: no  9. Ischial Tuberosity: no  10. Trochanter: no  11. Knee: no  12. Malleolus: no  13. Heel: no  14. Toes: no  15. Assessed for device associated injury no  16. Nursing Nutrition Assessment Completed yes    See Doc Flowsheet for additional wound details.     Pt has patches of psorisis on right calf, left breast and pt has significant bruising on right lower forearm.   INTERVENTIONS:

## 2017-02-01 NOTE — Consults
GI Consult Note      Admission Date: 01/31/2017                                                LOS: 1 day    Reason for Consult:  Anemia, suspected chronic GI bleed    Assessment/Plan   61 year old female, past medical history significant for paroxysmal A. fib on no anticoagulation, hyperlipidemia, hypertension, diabetes, transfusion dependent iron deficiency anemia, CAD status post PCI with DES, hypothyroidism, presented to [facility] on 8/30 with chest pain, found to have non-STEMI with a hemoglobin of 6.  Patient was referred to [facility] for further evaluation and management.  GI were consulted for further evaluation with concerns of chronic GI bleed.    ??? Seen in clinic by Dr. Burnis Medin on 10/30/2016 for evaluation of chronic (2-3 years) transfusion dependent anemia  ??? Previous workup at OSH in February 2018, including EGD, colonoscopy, and capsule endoscopy were essentially negative for bleeding source; capsule endoscopy was incomplete.  EGD with edematous mucosa throughout the stomach; gastric biopsy showed ectatic superficial vessels and chronic gastritis with no H. pylori.  Colonoscopy reported removal of 2 tubular adenomas.  ??? Febrile on presentation (T-max 103.1), normotensive, slightly tachycardic  ??? Urinalysis suggestive of urinary tract infection  ??? Reports intermittent hematochezia and black stools for many years last was a week ago.    ??? Rectal exam with formed brown stools, small nonbleeding hemorrhoids.    Recommendations:  1. Intravascular resuscitation with resource and packed RBC per primary team as indicated  2. Management of non-STEMI per primary team  3. Management of urinary tract infection per primary team  4. Please check antiparietal cell antibodies and celiac serology  5. Please check tagged RBC scan  6. Tentative plan for capsule endoscopy tomorrow  7. Please prep bowel with 1/2 gallon of Golytely   8. Further condition will follow capsule endoscopic evaluation Evaluated and discussed with my attending physician Dr. Janene Madeira  ______________________________________________________________________    History of Present Illness: Diane Savage is a 61 y.o. female, past medical history significant for paroxysmal A. fib on no anticoagulation, hyperlipidemia, hypertension, diabetes, transfusion dependent iron deficiency anemia, CAD status post PCI with DES, hypothyroidism, presented to [facility] on 8/30 with chest pain, found to have non-STEMI with a hemoglobin of 6.  Patient was referred to [facility] for further evaluation and management.  Patient was seen in GI clinic by Dr. Burnis Medin on 10/30/2016 for evaluation on chronic transfusion dependent iron deficiency anemia.  The plan was to proceed with capsule endoscopy and packed RBC scan along with checking B12 and folate levels, checking antiparietal cell antibody levels, and checking celiac serology.  Patient presented 2 days ago to outside facility with complaints of shortness of breath and chest pain, found to have non-ST elevation MI.  She was also noted to have a hemoglobin of 6.  Patient denies any abdominal pain, nausea or vomiting.  Patient reports occasional episodes of hematochezia, every 2-3 weeks, for the past few years.  She reports streaking when wiping and streaks of blood on stools.  Denies anal or rectal pain.  Patient also reports that occasionally her stools will be black.  She had the same symptoms when the panendoscopy workup was performed in February 2018.    Past Medical History:   Diagnosis Date   ??? Acquired hypothyroidism    ???  Arthritis    ??? Back pain    ??? Bleeding disorder (HCC)    ??? Coronary artery disease    ??? Diabetes mellitus (HCC)     Type II   ??? Hypertension    ??? Stomach disorder    ??? Vision decreased      Past Surgical History:   Procedure Laterality Date   ??? HX HEART CATHETERIZATION  2017   ??? CORONARY STENT PLACEMENT  2017   ??? ANKLE SURGERY      Left ankle reconstruction   ??? COLONOSCOPY ??? HX KNEE ARTHROSCOPY Left    ??? HX TONSIL AND ADENOIDECTOMY     ??? TUBAL LIGATION     ??? UPPER GASTROINTESTINAL ENDOSCOPY       Social History     Social History   ??? Marital status: Married     Spouse name: N/A   ??? Number of children: N/A   ??? Years of education: N/A     Social History Main Topics   ??? Smoking status: Former Smoker     Packs/day: 2.00     Years: 10.00     Types: Cigarettes     Quit date: 09/16/1980   ??? Smokeless tobacco: Never Used   ??? Alcohol use Yes      Comment: Seldom   ??? Drug use: No   ??? Sexual activity: Not on file     Other Topics Concern   ??? Not on file     Social History Narrative   ??? No narrative on file     Family History   Problem Relation Age of Onset   ??? Diabetes Mother    ??? Heart Disease Mother    ??? High Cholesterol Mother    ??? Arthritis-rheumatoid Mother    ??? Stroke Mother    ??? Thyroid Disease Mother    ??? Coronary Artery Disease Mother    ??? Hyperlipidemia Mother    ??? Cancer Father    ??? Cancer-Breast Sister    ??? Cancer Sister    ??? Hypertension Sister    ??? Heart Disease Sister    ??? High Cholesterol Sister    ??? Arthritis-osteo Sister    ??? Migraines Sister    ??? Rashes/Skin Problems Sister    ??? Depression Sister    ??? Hyperlipidemia Sister    ??? Cancer Brother    ??? Hypertension Brother    ??? Arthritis-rheumatoid Brother    ??? Rashes/Skin Problems Brother    ??? Coronary Artery Disease Brother    ??? Heart Disease Maternal Aunt    ??? Coronary Artery Disease Paternal Uncle    ??? Hyperlipidemia Paternal Uncle    ??? Cancer Maternal Grandmother    ??? Diabetes Maternal Grandmother    ??? Heart Disease Maternal Grandmother    ??? Cancer-Colon Paternal Grandmother    ??? Cancer Paternal Grandmother    ??? Diabetes Paternal Grandmother    ??? Cancer Paternal Grandfather      Allergies:  Diltiazem; Sulfa (sulfonamide antibiotics); Duricef [cefadroxil]; Pravastatin; and Simvastatin    Scheduled Meds:  aspirin chewable tablet 81 mg 81 mg Oral QDAY   doxycycline (VIBRAMYCIN) tablet 100 mg 100 mg Oral BID insulin aspart U-100 (NOVOLOG FLEXPEN) injection PEN 0-7 Units 0-7 Units Subcutaneous ACHS   levothyroxine (SYNTHROID) tablet 225 mcg 225 mcg Oral QDAY(07)   magnesium sulfate   1 g/D5W 100 mL IVPB 1 g Intravenous ONCE   pantoprazole (PROTONIX) injection 80 mg 80 mg Intravenous BID(11-21)  piperacillin/tazobactam  (ZOSYN) 3.375 g/50 mL iso-osmotic IVPB 3.375 g Intravenous Q6H*   rosuvastatin (CRESTOR) tablet 20 mg 20 mg Oral QDAY   vancomycin (VANCOCIN) 1,750 mg in dextrose 5% (D5W) IVPB 1,750 mg Intravenous Q24H*   Continuous Infusions:  ??? amiodarone (CORDARONE) 360 mg in dextrose, iso-osm 200 mL infusion 1 mg/min (02/01/17 0609)   ??? furosemide (LASIX) 500 mg in 50 mL IV drip syr (max conc) Stopped (02/01/17 1610)   ??? heparin (porcine) 20,000 units/D5W 500 mL infusion (std conc)(premade) 800 Units/hr (02/01/17 0742)   ??? nitroGLYCERIN 50 mg/D5W 250 mL infusion Stopped (01/31/17 2105)     PRN and Respiratory Meds:acetaminophen Q4H PRN, vancomycin   IVPB Q24H* **AND** vancomycin, pharmacy to manage Per Pharmacy    Review of Systems:  All other systems reviewed and are negative.  Vital Signs:  Last Filed in 24 hours Vital Signs:  24 hour Range    BP: 107/56 (09/01 0700)  Temp: 39.5 ???C (103.1 ???F) (09/01 0550)  Pulse: 119 (09/01 0700)  Respirations: 26 PER MINUTE (09/01 0700)  SpO2: 100 % (09/01 0700)  O2 Delivery: Nasal Cannula (09/01 0700)  SpO2 Pulse: 121 (09/01 0700)  Height: 162.6 cm (64) (08/31 1944) BP: (91-136)/(33-81)   Temp:  [37.9 ???C (100.2 ???F)-39.5 ???C (103.1 ???F)]   Pulse:  [89-132]   Respirations:  [11 PER MINUTE-26 PER MINUTE]   SpO2:  [88 %-100 %]   O2 Delivery: Nasal Cannula     Physical Exam:  Vitals:    02/01/17 0700   BP: 107/56   Pulse: 119   Temp:    SpO2: 100%       General: Awake, alert, oriented ???3  HEENT: Moist mucosal membranes, anicteric sclera  Neck: Supple, no JVD  Lymph: No cervical or supraclavicular lymphadenopathy  Heart: Regular rate and rhythm, intact distal pulses Lungs: Breathing comfortably on room air, no respiratory distress  Abdomen: Soft, nontender, nondistended, normal bowel sounds  Lower extremities: No edema, good pulses bilaterally  Neurologic: Grossly intact, no focal deficits  Skin: Warm, dry  DRE: Rectal exam with formed brown stools, small nonbleeding hemorrhoids    Lab/Radiology/Other Diagnostic Tests:  24-hour labs:    Results for orders placed or performed during the hospital encounter of 01/31/17 (from the past 24 hour(s))   CBC AND DIFF    Collection Time: 01/31/17  8:18 PM   Result Value Ref Range    White Blood Cells 8.9 4.5 - 11.0 K/UL    RBC 2.42 (L) 4.0 - 5.0 M/UL    Hemoglobin 7.3 (L) 12.0 - 15.0 GM/DL    Hematocrit 96.0 (L) 36 - 45 %    MCV 93.1 80 - 100 FL    MCH 30.0 26 - 34 PG    MCHC 32.2 32.0 - 36.0 G/DL    RDW 45.4 (H) 11 - 15 %    Platelet Count 164 150 - 400 K/UL    MPV 8.1 7 - 11 FL    Neutrophils 75 41 - 77 %    Lymphocytes 12 (L) 24 - 44 %    Monocytes 11 4 - 12 %    Eosinophils 1 0 - 5 %    Basophils 1 0 - 2 %    Absolute Neutrophil Count 6.70 1.8 - 7.0 K/UL    Absolute Lymph Count 1.00 1.0 - 4.8 K/UL    Absolute Monocyte Count 1.00 (H) 0 - 0.80 K/UL    Absolute Eosinophil Count 0.10 0 - 0.45 K/UL  Absolute Basophil Count 0.00 0 - 0.20 K/UL   PROTIME INR (PT)    Collection Time: 01/31/17  8:18 PM   Result Value Ref Range    INR 1.2 0.8 - 1.2   PTT (APTT)    Collection Time: 01/31/17  8:18 PM   Result Value Ref Range    APTT 24.9 20.0 - 36.0 SEC   COMPREHENSIVE METABOLIC PANEL    Collection Time: 01/31/17  8:18 PM   Result Value Ref Range    Sodium 130 (L) 137 - 147 MMOL/L    Potassium 4.3 3.5 - 5.1 MMOL/L    Chloride 100 98 - 110 MMOL/L    Glucose 199 (H) 70 - 100 MG/DL    Blood Urea Nitrogen 20 7 - 25 MG/DL    Creatinine 9.60 (H) 0.4 - 1.00 MG/DL    Calcium 8.9 8.5 - 45.4 MG/DL    Total Protein 6.4 6.0 - 8.0 G/DL    Total Bilirubin 0.8 0.3 - 1.2 MG/DL    Albumin 3.2 (L) 3.5 - 5.0 G/DL    Alk Phosphatase 54 25 - 110 U/L AST (SGOT) 26 7 - 40 U/L    CO2 24 21 - 30 MMOL/L    ALT (SGPT) 9 7 - 56 U/L    Anion Gap 6 3 - 12    eGFR Non African American 29 (L) >60 mL/min    eGFR African American 35 (L) >60 mL/min   MAGNESIUM    Collection Time: 01/31/17  8:18 PM   Result Value Ref Range    Magnesium 2.0 1.6 - 2.6 mg/dL   PHOSPHORUS    Collection Time: 01/31/17  8:18 PM   Result Value Ref Range    Phosphorus 1.8 (L) 2.0 - 4.0 MG/DL   BNP (B-TYPE NATRIURETIC PEPTI)    Collection Time: 01/31/17  8:18 PM   Result Value Ref Range    B Type Natriuretic Peptide 1,178.0 (H) 0 - 100 PG/ML   TROPONIN-I    Collection Time: 01/31/17  8:18 PM   Result Value Ref Range    Troponin-I 0.76 (H) 0.0 - 0.05 NG/ML   TSH WITH FREE T4 REFLEX    Collection Time: 01/31/17  8:18 PM   Result Value Ref Range    TSH 0.250 (L) 0.35 - 5.00 MCU/ML   FREE T4-FREE THYROXINE    Collection Time: 01/31/17  8:18 PM   Result Value Ref Range    T4-Free 1.3 0.6 - 1.6 NG/DL   POC GLUCOSE    Collection Time: 01/31/17  8:19 PM   Result Value Ref Range    Glucose, POC 234 (H) 70 - 100 MG/DL   URINALYSIS DIPSTICK REFLEX TO CULTURE    Collection Time: 01/31/17  9:07 PM   Result Value Ref Range    Color,UA YELLOW     Turbidity,UA CLEAR CLEAR-CLEAR    Specific Gravity-Urine 1.044 (H) 1.003 - 1.035    pH,UA 5.0 5.0 - 8.0    Protein,UA NEG NEG-NEG    Glucose,UA NEG NEG-NEG    Ketones,UA NEG NEG-NEG    Bilirubin,UA NEG NEG-NEG    Blood,UA 1+ (A) NEG-NEG    Urobilinogen,UA NORMAL NORM-NORMAL    Nitrite,UA NEG NEG-NEG    Leukocytes,UA 3+ (A) NEG-NEG    Urine Ascorbic Acid, UA NEG NEG-NEG   URINALYSIS MICROSCOPIC REFLEX TO CULTURE    Collection Time: 01/31/17  9:07 PM   Result Value Ref Range    WBCs,UA 10-20 0 - 2 /HPF  RBCs,UA 2-10 0 - 3 /HPF    Comment,UA       Urine submitted for reflex culture if criteria are met:WBC>10, positive nitrite   and/or >=1+ leukocyte esterase. If quantity is not sufficient, an addendum will   follow.      MucousUA TRACE     Bacteria,UA MODERATE (A) NEG-NEG Squamous Epithelial Cells 0-2 0 - 5   BLOOD GASES, PERIPHERAL VENOUS    Collection Time: 01/31/17  9:20 PM   Result Value Ref Range    pH-Venous 7.25 (L) 7.30 - 7.40    PCO2-Venous 64 (H) 36 - 50 MMHG    PO2-Venous 36 33 - 48 MMHG    Base Excess-Venous 0.3 MMOL/L    O2 Sat-Venous 61.8 55 - 71 %    Bicarbonate-VEN-Cal 24.3 MMOL/L   TYPE & CROSSMATCH    Collection Time: 01/31/17  9:20 PM   Result Value Ref Range    Units Ordered 1     Crossmatch Expires 02/03/2017     Record Check 2ND TYPE REQUIRED     ABO/RH(D) A POS     Antibody Screen POS     Antibody Indentification Anti-Jk(a),     Unit Number [id number]     Blood Component Type RBC,ADSOL,LEUKO REDUCED     Unit Division 0     Status OF Unit ISSUED     Transfusion Status OK TO TRANSFUSE     Crossmatch Result COMPATIBLE, GEL    LACTIC ACID (BG - RAPID LACTATE)    Collection Time: 01/31/17  9:20 PM   Result Value Ref Range    Lactic Acid,BG 1.5 0.5 - 2.0 MMOL/L   BLOOD TYPE CONFIRMATION - ORDER ONLY IF REQUESTED BY LAB    Collection Time: 01/31/17  9:49 PM   Result Value Ref Range    ABO/RH(D) A POS    TROPONIN-I    Collection Time: 01/31/17 11:20 PM   Result Value Ref Range    Troponin-I 0.89 (H) 0.0 - 0.05 NG/ML   CBC AND DIFF    Collection Time: 02/01/17  5:00 AM   Result Value Ref Range    White Blood Cells 7.9 4.5 - 11.0 K/UL    RBC 2.71 (L) 4.0 - 5.0 M/UL    Hemoglobin 8.4 (L) 12.0 - 15.0 GM/DL    Hematocrit 11.9 (L) 36 - 45 %    MCV 93.1 80 - 100 FL    MCH 30.8 26 - 34 PG    MCHC 33.1 32.0 - 36.0 G/DL    RDW 14.7 (H) 11 - 15 %    Platelet Count 151 150 - 400 K/UL    MPV 8.1 7 - 11 FL    Neutrophils 70 41 - 77 %    Lymphocytes 15 (L) 24 - 44 %    Monocytes 12 4 - 12 %    Eosinophils 3 0 - 5 %    Basophils 0 0 - 2 %    Absolute Neutrophil Count 5.60 1.8 - 7.0 K/UL    Absolute Lymph Count 1.20 1.0 - 4.8 K/UL    Absolute Monocyte Count 1.00 (H) 0 - 0.80 K/UL    Absolute Eosinophil Count 0.20 0 - 0.45 K/UL    Absolute Basophil Count 0.00 0 - 0.20 K/UL COMPREHENSIVE METABOLIC PANEL    Collection Time: 02/01/17  5:00 AM   Result Value Ref Range    Sodium 133 (L) 137 - 147 MMOL/L    Potassium 4.1 3.5 - 5.1 MMOL/L  Chloride 101 98 - 110 MMOL/L    Glucose 154 (H) 70 - 100 MG/DL    Blood Urea Nitrogen 19 7 - 25 MG/DL    Creatinine 0.10 (H) 0.4 - 1.00 MG/DL    Calcium 8.8 8.5 - 27.2 MG/DL    Total Protein 6.3 6.0 - 8.0 G/DL    Total Bilirubin 0.9 0.3 - 1.2 MG/DL    Albumin 3.1 (L) 3.5 - 5.0 G/DL    Alk Phosphatase 52 25 - 110 U/L    AST (SGOT) 27 7 - 40 U/L    CO2 26 21 - 30 MMOL/L    ALT (SGPT) 9 7 - 56 U/L    Anion Gap 6 3 - 12    eGFR Non African American 30 (L) >60 mL/min    eGFR African American 36 (L) >60 mL/min   MAGNESIUM    Collection Time: 02/01/17  5:00 AM   Result Value Ref Range    Magnesium 1.8 1.6 - 2.6 mg/dL   LIPID PROFILE    Collection Time: 02/01/17  5:00 AM   Result Value Ref Range    Cholesterol 169 <200 MG/DL    Triglycerides 97 <536 MG/DL    HDL 34 (L) >64 MG/DL    LDL 403 (H) <474 MG/DL    VLDL 19 MG/DL    Non HDL Cholesterol 135 MG/DL   TROPONIN-I    Collection Time: 02/01/17  5:00 AM   Result Value Ref Range    Troponin-I 0.91 (H) 0.0 - 0.05 NG/ML   PHOSPHORUS    Collection Time: 02/01/17  5:00 AM   Result Value Ref Range    Phosphorus 1.8 (L) 2.0 - 4.0 MG/DL   BLOOD GASES, CENTRAL VENOUS    Collection Time: 02/01/17  5:55 AM   Result Value Ref Range    PH-Central Venous 7.33 7.30 - 7.40    PCO2-Central Venous 52 >40 MMHG    PO2-Central Venous 49 40 - 50 MMHG    Base Excess-Central Venous 1.0 MMOL/L    O2 Sat (Calc)-Central Venous 84.0 (H) 65 - 75 %    Bicarb-Central Venous 25.1 MMOL/L     Pertinent radiology reviewed.      Sheran Lawless, MD  Gastroenterology & Hepatology Fellow  Pager  [phone]

## 2017-02-01 NOTE — Consults
CTS CONSULT  Date of Service: 02/01/2017    Requesting Physician: Dr. Benedict Needy  Consulting Physician: Dr. Farris Has  Consult Performed By:   Dr. Effie Berkshire    HPI:               Cardiothoracic consultation has been requested to determine if the patient is a surgical candidate.    Cardiac presentation on admission:NSTEMI    61 yo female with PMH pA-fib, HTN, HLD, NIDDM, Chronic blood anemia, transfusion dependent anemia, CAD, hypothyroidism who presented to OSH with complains of chest pain. Patient states that she has had chest pain for months. Troponin was elevated at 0.3.  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 She also developed A fib with RVR at OSH. Her Hgb at the OSH was 6.3 on arrival. Patient was transferred to [facility] for further care.  Anemia has been presents for years and has had an extensive GI work up including EGD, capsule and CE which were all negative. She was taken off of anticoagulation given her chronic anemia. Currently denies any chest pain, laying in bed comfortably.     STS RISK:  Risk of Mortality: 1.487%   Morbidity or Mortality: 18.537%   Long Length of Stay: 7.116%   Short Length of Stay: 30.071%   Permanent Stroke: 1.408%   Prolonged Ventilation: 10.822%   DSW Infection: 0.702%   Renal Failure: 8.986%   Reoperation: 4.71%   PRINT   CL    CATH FINDINGS: from OSH    Coronary Stenosis  80% distal left main, moderate LAD disease, LCx 80% ostial 70%; RCA with 50% lesion and distal 70% disease. Aortic pressure 104/52, LVp 109/28, LVEDP , normal LV function    Past Medical History:   Diagnosis Date   ??? Acquired hypothyroidism    ??? Arthritis    ??? Back pain    ??? Bleeding disorder (HCC)    ??? Coronary artery disease    ??? Diabetes mellitus (HCC)     Type II   ??? Hypertension    ??? Stomach disorder    ??? Vision decreased        Past Surgical History:   Procedure Laterality Date   ??? HX HEART CATHETERIZATION  2017   ??? CORONARY STENT PLACEMENT  2017   ??? ANKLE SURGERY Left ankle reconstruction   ??? COLONOSCOPY     ??? HX KNEE ARTHROSCOPY Left    ??? HX TONSIL AND ADENOIDECTOMY     ??? TUBAL LIGATION     ??? UPPER GASTROINTESTINAL ENDOSCOPY          Medications:    aspirin chewable tablet 81 mg 81 mg Oral QDAY   insulin aspart U-100 (NOVOLOG FLEXPEN) injection PEN 0-7 Units 0-7 Units Subcutaneous ACHS   levothyroxine (SYNTHROID) tablet 225 mcg 225 mcg Oral QDAY(07)   pantoprazole (PROTONIX) injection 80 mg 80 mg Intravenous BID(11-21)   piperacillin/tazobactam  (ZOSYN) 3.375 g/50 mL iso-osmotic IVPB 3.375 g Intravenous Q6H*   [START ON 02/02/2017] pneumococcal 23-val vaccine (PPSV23) (PNEUMOVAX 23) injection 0.5 mL 0.5 mL Intramuscular ONCE   rosuvastatin (CRESTOR) tablet 20 mg 20 mg Oral QDAY       Allergies   Allergen Reactions   ??? Diltiazem RASH, NAUSEA AND VOMITING and SEE COMMENTS     Sore mouth   ??? Sulfa (Sulfonamide Antibiotics) RASH   ??? Duricef [Cefadroxil] NAUSEA AND VOMITING   ??? Pravastatin NAUSEA AND VOMITING   ??? Simvastatin NAUSEA AND VOMITING  Family History   Problem Relation Age of Onset   ??? Diabetes Mother    ??? Heart Disease Mother    ??? High Cholesterol Mother    ??? Arthritis-rheumatoid Mother    ??? Stroke Mother    ??? Thyroid Disease Mother    ??? Coronary Artery Disease Mother    ??? Hyperlipidemia Mother    ??? Cancer Father    ??? Cancer-Breast Sister    ??? Cancer Sister    ??? Hypertension Sister    ??? Heart Disease Sister    ??? High Cholesterol Sister    ??? Arthritis-osteo Sister    ??? Migraines Sister    ??? Rashes/Skin Problems Sister    ??? Depression Sister    ??? Hyperlipidemia Sister    ??? Cancer Brother    ??? Hypertension Brother    ??? Arthritis-rheumatoid Brother    ??? Rashes/Skin Problems Brother    ??? Coronary Artery Disease Brother    ??? Heart Disease Maternal Aunt    ??? Coronary Artery Disease Paternal Uncle    ??? Hyperlipidemia Paternal Uncle    ??? Cancer Maternal Grandmother    ??? Diabetes Maternal Grandmother    ??? Heart Disease Maternal Grandmother ??? Cancer-Colon Paternal Grandmother    ??? Cancer Paternal Grandmother    ??? Diabetes Paternal Grandmother    ??? Cancer Paternal Grandfather        Social History     Social History   ??? Marital status: Married     Spouse name: N/A   ??? Number of children: N/A   ??? Years of education: N/A     Social History Main Topics   ??? Smoking status: Former Smoker     Packs/day: 2.00     Years: 10.00     Types: Cigarettes     Quit date: 09/16/1980   ??? Smokeless tobacco: Never Used   ??? Alcohol use Yes      Comment: Seldom   ??? Drug use: No   ??? Sexual activity: Not on file     Other Topics Concern   ??? Not on file     Social History Narrative   ??? No narrative on file       ROS:  Constitutional: + for Fatigue,. No fevers   Eyes, Ears, Nose And Throat: Negative for Change in vision, Change in Hearing   Cardiovascular: Negative for Chest Pain, Palpitations, Swelling in ankles  Respiratory: Negative for Cough, Shortness of Breath, wheezing  Gastrointestinal: Negative for Nausea, indigestion, Diarrhea, Constipation, Rectal Bleeding   Neurological: Negative for Headache, Memory problems, Numbness, Muscle Weakness  Psychological: Negative for depression or anxiety  Musculoskeletal: Negative for Pain or Swelling in joints  Genitourinary: Negative for Pain with urination, Incontinence of urine, urinary frequency    Skin: Negative for any unusual rash  Endocrine: Negative for Any hair skin or nail changes, Unusual hunger or thirst    Physical Exam:  Temp: 36.8 ???C (98.3 ???F) (09/01 1100)  Pulse: 110 (09/01 1100)  Respirations: 15 PER MINUTE (09/01 1100)  BP: 93/53 (09/01 1100)      GENERAL:   A&O x 3, NAD.  HEENT  Head:  Normocephalic   Teeth: Present and in good dentition  NECK  Active ROM: full  Trachea: midline  HEART  Neck Veins- No JVD   Carotid Arteries:  No bruits  Cardiac: afib with RVR   LUNGS  Auscultation- breath sounds CTA bilaterally  ABDOMEN  Soft, NT + BS  EXTREMITIES  Edema- peripheral edema  Posterior Tibial- 2+ bil Dorsalis Pedis- 2+ bil  SKIN:   Normal, without lesions  NEUROLOGIC  Grossly intact     Results for orders placed or performed during the hospital encounter of 01/31/17 (from the past 24 hour(s))   CBC AND DIFF    Collection Time: 01/31/17  8:18 PM   # # Low-High    White Blood Cells 8.9 4.5 - 11.0 K/UL    RBC 2.42 (L) 4.0 - 5.0 M/UL    Hemoglobin 7.3 (L) 12.0 - 15.0 GM/DL    Hematocrit 59.5 (L) 36 - 45 %    MCV 93.1 80 - 100 FL    MCH 30.0 26 - 34 PG    MCHC 32.2 32.0 - 36.0 G/DL    RDW 63.8 (H) 11 - 15 %    Platelet Count 164 150 - 400 K/UL    MPV 8.1 7 - 11 FL    Neutrophils 75 41 - 77 %    Lymphocytes 12 (L) 24 - 44 %    Monocytes 11 4 - 12 %    Eosinophils 1 0 - 5 %    Basophils 1 0 - 2 %    Absolute Neutrophil Count 6.70 1.8 - 7.0 K/UL    Absolute Lymph Count 1.00 1.0 - 4.8 K/UL    Absolute Monocyte Count 1.00 (H) 0 - 0.80 K/UL    Absolute Eosinophil Count 0.10 0 - 0.45 K/UL    Absolute Basophil Count 0.00 0 - 0.20 K/UL   PROTIME INR (PT)    Collection Time: 01/31/17  8:18 PM   # # Low-High    INR 1.2 0.8 - 1.2   PTT (APTT)    Collection Time: 01/31/17  8:18 PM   # # Low-High    APTT 24.9 20.0 - 36.0 SEC   COMPREHENSIVE METABOLIC PANEL    Collection Time: 01/31/17  8:18 PM   # # Low-High    Sodium 130 (L) 137 - 147 MMOL/L    Potassium 4.3 3.5 - 5.1 MMOL/L    Chloride 100 98 - 110 MMOL/L    Glucose 199 (H) 70 - 100 MG/DL    Blood Urea Nitrogen 20 7 - 25 MG/DL    Creatinine 7.56 (H) 0.4 - 1.00 MG/DL    Calcium 8.9 8.5 - 43.3 MG/DL    Total Protein 6.4 6.0 - 8.0 G/DL    Total Bilirubin 0.8 0.3 - 1.2 MG/DL    Albumin 3.2 (L) 3.5 - 5.0 G/DL    Alk Phosphatase 54 25 - 110 U/L    AST (SGOT) 26 7 - 40 U/L    CO2 24 21 - 30 MMOL/L    ALT (SGPT) 9 7 - 56 U/L    Anion Gap 6 3 - 12    eGFR Non African American 29 (L) >60 mL/min    eGFR African American 35 (L) >60 mL/min   MAGNESIUM    Collection Time: 01/31/17  8:18 PM   # # Low-High    Magnesium 2.0 1.6 - 2.6 mg/dL   PHOSPHORUS    Collection Time: 01/31/17  8:18 PM # # Low-High    Phosphorus 1.8 (L) 2.0 - 4.0 MG/DL   BNP (B-TYPE NATRIURETIC PEPTI)    Collection Time: 01/31/17  8:18 PM   # # Low-High    B Type Natriuretic Peptide 1,178.0 (H) 0 - 100 PG/ML   TROPONIN-I    Collection Time: 01/31/17  8:18 PM   # #  Low-High    Troponin-I 0.76 (H) 0.0 - 0.05 NG/ML   TSH WITH FREE T4 REFLEX    Collection Time: 01/31/17  8:18 PM   # # Low-High    TSH 0.250 (L) 0.35 - 5.00 MCU/ML   HEMOGLOBIN A1C    Collection Time: 01/31/17  8:18 PM   # # Low-High    Hemoglobin A1C 7.1 (H) 4.0 - 6.0 %   FREE T4-FREE THYROXINE    Collection Time: 01/31/17  8:18 PM   # # Low-High    T4-Free 1.3 0.6 - 1.6 NG/DL   POC GLUCOSE    Collection Time: 01/31/17  8:19 PM   # # Low-High    Glucose, POC 234 (H) 70 - 100 MG/DL   URINALYSIS DIPSTICK REFLEX TO CULTURE    Collection Time: 01/31/17  9:07 PM   # # Low-High    Color,UA YELLOW     Turbidity,UA CLEAR CLEAR-CLEAR    Specific Gravity-Urine 1.044 (H) 1.003 - 1.035    pH,UA 5.0 5.0 - 8.0    Protein,UA NEG NEG-NEG    Glucose,UA NEG NEG-NEG    Ketones,UA NEG NEG-NEG    Bilirubin,UA NEG NEG-NEG    Blood,UA 1+ (A) NEG-NEG    Urobilinogen,UA NORMAL NORM-NORMAL    Nitrite,UA NEG NEG-NEG    Leukocytes,UA 3+ (A) NEG-NEG    Urine Ascorbic Acid, UA NEG NEG-NEG   URINALYSIS MICROSCOPIC REFLEX TO CULTURE    Collection Time: 01/31/17  9:07 PM   # # Low-High    WBCs,UA 10-20 0 - 2 /HPF    RBCs,UA 2-10 0 - 3 /HPF    Comment,UA       Urine submitted for reflex culture if criteria are met:WBC>10, positive nitrite   and/or >=1+ leukocyte esterase. If quantity is not sufficient, an addendum will   follow.      MucousUA TRACE     Bacteria,UA MODERATE (A) NEG-NEG    Squamous Epithelial Cells 0-2 0 - 5   BLOOD GASES, PERIPHERAL VENOUS    Collection Time: 01/31/17  9:20 PM   # # Low-High    pH-Venous 7.25 (L) 7.30 - 7.40    PCO2-Venous 64 (H) 36 - 50 MMHG    PO2-Venous 36 33 - 48 MMHG    Base Excess-Venous 0.3 MMOL/L    O2 Sat-Venous 61.8 55 - 71 % Bicarbonate-VEN-Cal 24.3 MMOL/L   TYPE & CROSSMATCH    Collection Time: 01/31/17  9:20 PM   # # Low-High    Units Ordered 1     Crossmatch Expires 02/03/2017     Record Check 2ND TYPE REQUIRED     ABO/RH(D) A POS     Antibody Screen POS     Antibody Indentification Anti-Jk(a),     Unit Number [id number]     Blood Component Type RBC,ADSOL,LEUKO REDUCED     Unit Division 0     Status OF Unit ISSUED     Transfusion Status OK TO TRANSFUSE     Crossmatch Result COMPATIBLE, GEL    LACTIC ACID (BG - RAPID LACTATE)    Collection Time: 01/31/17  9:20 PM   # # Low-High    Lactic Acid,BG 1.5 0.5 - 2.0 MMOL/L   BLOOD TYPE CONFIRMATION - ORDER ONLY IF REQUESTED BY LAB    Collection Time: 01/31/17  9:49 PM   # # Low-High    ABO/RH(D) A POS    TROPONIN-I    Collection Time: 01/31/17 11:20 PM   # # Margarito Liner  Troponin-I 0.89 (H) 0.0 - 0.05 NG/ML   CBC AND DIFF    Collection Time: 02/01/17  5:00 AM   # # Low-High    White Blood Cells 7.9 4.5 - 11.0 K/UL    RBC 2.71 (L) 4.0 - 5.0 M/UL    Hemoglobin 8.4 (L) 12.0 - 15.0 GM/DL    Hematocrit 16.1 (L) 36 - 45 %    MCV 93.1 80 - 100 FL    MCH 30.8 26 - 34 PG    MCHC 33.1 32.0 - 36.0 G/DL    RDW 09.6 (H) 11 - 15 %    Platelet Count 151 150 - 400 K/UL    MPV 8.1 7 - 11 FL    Neutrophils 70 41 - 77 %    Lymphocytes 15 (L) 24 - 44 %    Monocytes 12 4 - 12 %    Eosinophils 3 0 - 5 %    Basophils 0 0 - 2 %    Absolute Neutrophil Count 5.60 1.8 - 7.0 K/UL    Absolute Lymph Count 1.20 1.0 - 4.8 K/UL    Absolute Monocyte Count 1.00 (H) 0 - 0.80 K/UL    Absolute Eosinophil Count 0.20 0 - 0.45 K/UL    Absolute Basophil Count 0.00 0 - 0.20 K/UL   COMPREHENSIVE METABOLIC PANEL    Collection Time: 02/01/17  5:00 AM   # # Low-High    Sodium 133 (L) 137 - 147 MMOL/L    Potassium 4.1 3.5 - 5.1 MMOL/L    Chloride 101 98 - 110 MMOL/L    Glucose 154 (H) 70 - 100 MG/DL    Blood Urea Nitrogen 19 7 - 25 MG/DL    Creatinine 0.45 (H) 0.4 - 1.00 MG/DL    Calcium 8.8 8.5 - 40.9 MG/DL Total Protein 6.3 6.0 - 8.0 G/DL    Total Bilirubin 0.9 0.3 - 1.2 MG/DL    Albumin 3.1 (L) 3.5 - 5.0 G/DL    Alk Phosphatase 52 25 - 110 U/L    AST (SGOT) 27 7 - 40 U/L    CO2 26 21 - 30 MMOL/L    ALT (SGPT) 9 7 - 56 U/L    Anion Gap 6 3 - 12    eGFR Non African American 30 (L) >60 mL/min    eGFR African American 36 (L) >60 mL/min   MAGNESIUM    Collection Time: 02/01/17  5:00 AM   # # Low-High    Magnesium 1.8 1.6 - 2.6 mg/dL   LIPID PROFILE    Collection Time: 02/01/17  5:00 AM   # # Low-High    Cholesterol 169 <200 MG/DL    Triglycerides 97 <811 MG/DL    HDL 34 (L) >91 MG/DL    LDL 478 (H) <295 MG/DL    VLDL 19 MG/DL    Non HDL Cholesterol 135 MG/DL   TROPONIN-I    Collection Time: 02/01/17  5:00 AM   # # Low-High    Troponin-I 0.91 (H) 0.0 - 0.05 NG/ML   PHOSPHORUS    Collection Time: 02/01/17  5:00 AM   # # Low-High    Phosphorus 1.8 (L) 2.0 - 4.0 MG/DL   BLOOD GASES, CENTRAL VENOUS    Collection Time: 02/01/17  5:55 AM   # # Low-High    PH-Central Venous 7.33 7.30 - 7.40    PCO2-Central Venous 52 >40 MMHG    PO2-Central Venous 49 40 - 50 MMHG  Base Excess-Central Venous 1.0 MMOL/L    O2 Sat (Calc)-Central Venous 84.0 (H) 65 - 75 %    Bicarb-Central Venous 25.1 MMOL/L   LACTIC ACID(LACTATE)    Collection Time: 02/01/17  8:15 AM   # # Low-High    Lactic Acid 1.3 0.5 - 2.0 MMOL/L   POC GLUCOSE    Collection Time: 02/01/17  8:37 AM   # # Low-High    Glucose, POC 186 (H) 70 - 100 MG/DL   TROPONIN-I    Collection Time: 02/01/17  9:50 AM   # # Low-High    Troponin-I 0.97 (H) 0.0 - 0.05 NG/ML        Impression:  Active Hospital Problems    Diagnosis   ??? NSTEMI (non-ST elevated myocardial infarction) (HCC)   ??? Type 2 diabetes mellitus with circulatory disorder, without long-term current use of insulin (HCC)   ??? Atrial fibrillation (HCC)   ??? Atrial fibrillation with rapid ventricular response (HCC)   ??? Chronic gastrointestinal bleeding   ??? Transfusion-dependent anemia 61 yo female with PMH pA-fib not on AC, HTN, HLD, cKD NIDDM, Chronic blood anemia, transfusion dependent anemia, CAD, hypothyroidism who presented to OSH with dx of NSTEMI, LHC revealed Multivessel CAD      Plan:  Dr. Farris Has has reviewed all of the patient's preop testing. He agrees that surgical intervention is the most appropriate course of action. We discussed the need for 3- vessel bypass along with atrial appendage ligation/Maze procedure to take care of her pA-fib. GI to evaluate the patient for her chronic anemia and necessity for further GI work up.  In the meantime,  We recommend Carotid US, PFTs, and echo prior to proceeding with surgery.   The entire procedure, risks, benefits, alternatives, and complications have been discussed. Potential risks include but are not limited to infection, arrhythmias, bleeding requiring re-operation, prolonged intubation, ARF requiring hemodialysis, CVA, MI, and possibly even death. The patient understands, accepts, and wishes to proceed.            Thank you for the consult          Dyann Ruddle, MD  [phone]

## 2017-02-01 NOTE — Progress Notes
Pharmacy Vancomycin Note  Subjective:   Diane Savage is a 61 y.o. female being treated for sepsis.      Plan:   1. Vancomycin discontinued. Pharmacy to manage vancomycin signing off.    Lucianne Muss, [facility]  02/01/2017

## 2017-02-01 NOTE — Progress Notes
Critical Care Progress Note          Today's Date:  02/01/2017  Name:  Diane Savage                       MRN:  [id number]   Admission Date: 01/31/2017  LOS: 1 day                     Assessment/Plan:   Active Problems:    NSTEMI (non-ST elevated myocardial infarction) (HCC)    Type 2 diabetes mellitus with circulatory disorder, without long-term current use of insulin (HCC)    Atrial fibrillation (HCC)    Atrial fibrillation with rapid ventricular response (HCC)    Chronic gastrointestinal bleeding    Transfusion-dependent anemia    I have seen, personally fully evaluated, and discussed patient with the CICU team. 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  I spent 45 minutes (excluding time spent performing or supervising any procedures) providing and personally directing critical care services including ventilator management, pain mgt, hemodynamic monitoring and management, lab and radiology review, medication review and management, fluid and electrolyte management and coordination of care.       Donia Ast, MD  Anesthesia/Critical Care Medicine  Attending Physician  Pgr [phone]    1. Neuro: APAP PRN pain  2. CV: 3 vessel disease seen on OSH LHC after presenting with NSTEMI. CTS consult, appreciate recs.  TTE pending.  Amiodarone started for afib w/RVR.  BPs borderline this AM, hold PTA toprol and ACEI  3. Pulm: on 2 L NC, wean as tolerated.  Encourage IS. PFTs ordered for preop eval  4. GI: GI consult for acute on chronic blood loss anemia.  Appreciate recs.  PPI BID  5. Renal: Cr 1.75, unclear baseline.  Avoid nephrotoxins as able.  Likely has UTI based on fevers and U/A, continue cefepime.  Weaning lasix gtt off and converting to bolus dosing per CV1 team  6. Endo: PTA synthroid 175 mcg. Hx DMII, check HbA1c, ensure BG<200.  Holding PTA oral hypoglycemics 7. Heme: acute on chronic blood loss anemia for which she had had multiple extensive workups that have demonstrated chronic gastritis and 2 tubular adenomas.  Receiving iron infusions and intermittent transfusions as an outpatient, and now has antibodies.  Goal Hb > 8 in setting of symptomatic anemia and NSTEMI, maintain active T&C.  On heparin gtt for ACS and afib indications with low PTT goal (50-70)  8. ID: febrile overnight w/ altered mentation, Vanc/Zosyn/doxycycline started empirically and BCx sent x 2.  U/A quite dirty, will d/c OSH Foley catheter and continue cefepime  9. PPx: SCDs, PPI BID    Dispo: needs ICU    __________________________________________________________________________________  Subjective:  Diane Savage is a 61 y.o. female w/ PMH of chronic gastritis, DMII, afib, and transfusion-dependent anemia who presented with chest pain to an OSH.  LHC there demonstrated triple vessel disease in addition to Hb of 6.1, for which she received 2 units pRBCS.  She was transferred to [facility] for further evaluation.    This morning she denies any symptoms of chest pain or shortness of breath.       Objective:  Medications:  Scheduled Meds:  aspirin chewable tablet 81 mg 81 mg Oral QDAY   doxycycline (VIBRAMYCIN) tablet 100 mg 100 mg Oral BID   insulin aspart U-100 (NOVOLOG FLEXPEN) injection PEN 0-7  Units 0-7 Units Subcutaneous ACHS   levothyroxine (SYNTHROID) tablet 225 mcg 225 mcg Oral QDAY(07)   magnesium sulfate   1 g/D5W 100 mL IVPB 1 g Intravenous ONCE   pantoprazole (PROTONIX) injection 80 mg 80 mg Intravenous BID(11-21)   piperacillin/tazobactam  (ZOSYN) 3.375 g/50 mL iso-osmotic IVPB 3.375 g Intravenous Q6H*   rosuvastatin (CRESTOR) tablet 20 mg 20 mg Oral QDAY   vancomycin (VANCOCIN) 1,750 mg in dextrose 5% (D5W) IVPB 1,750 mg Intravenous Q24H*   Continuous Infusions:  ??? amiodarone (CORDARONE) 360 mg in dextrose, iso-osm 200 mL infusion 1 mg/min (02/01/17 0609) ??? furosemide (LASIX) 500 mg in 50 mL IV drip syr (max conc) Stopped (02/01/17 4540)   ??? heparin (porcine) 20,000 units/D5W 500 mL infusion (std conc)(premade) 800 Units/hr (02/01/17 9811)   ??? nitroGLYCERIN 50 mg/D5W 250 mL infusion Stopped (01/31/17 2105)     PRN and Respiratory Meds:vancomycin   IVPB Q24H* **AND** vancomycin, pharmacy to manage Per Pharmacy                     Vital Signs: Last Filed                  Vital Signs: 24 Hour Range   BP: 114/45 (09/01 0400)  Temp: 39.5 ???C (103.1 ???F) (09/01 0550)  Pulse: 106 (09/01 0400)  Respirations: 15 PER MINUTE (09/01 0400)  SpO2: 97 % (09/01 0400)  O2 Delivery: Nasal Cannula (09/01 0500)  Height: 162.6 cm (64) (08/31 1944)  Weight: 115.8 kg (255 lb 4.7 oz) (09/01 0545)  BP: (91-136)/(33-81)   Temp:  [37.9 ???C (100.2 ???F)-39.5 ???C (103.1 ???F)]   Pulse:  [89-113]   Respirations:  [11 PER MINUTE-21 PER MINUTE]   SpO2:  [95 %-100 %]   O2 Delivery: Nasal Cannula    Intensity Pain Scale 0-10 (Pain 1): (not recorded) Vitals:    01/31/17 1944 02/01/17 0545   Weight: 115 kg (253 lb 8.5 oz) 115.8 kg (255 lb 4.7 oz)       Critical Care Vitals:      ICP Monitoring:     PA  Catheter:     Hemodynamics/Oxycalcs:       Intake/Output Summary:  (Last 24 hours)    Intake/Output Summary (Last 24 hours) at 02/01/17 0730  Last data filed at 02/01/17 0600   Gross per 24 hour   Intake           302.95 ml   Output              886 ml   Net          -583.05 ml         Physical Exam:   General: obese female in NAD  Neck: no JVD  CV: irregular rate and rhythm  Chest: lungs CTAB  Abdomen: NTND, +NABS  Extremities: WWP    Artificial airway:  None                                                                                       Ventilator/ Respiratory Therapy:  No   Vent weaning trial:  Not applicable  Prophylaxis Review:  Lines:  No  Urinary Catheter:  No  Antibiotic Usage:  Yes; Infection present or suspected:  GU/GI;  Urinary tract infection (UTI) VTE:  Mechanical prophylaxis; Sequential compression device    Lab Review:  Pertinent labs reviewed  Point of Care Testing:  (Last 24 hours):  Glucose: (!) 154 (02/01/17 0500)  POC Glucose (Download): (!) 234 (01/31/17 2019)    Radiology and Other Diagnostic Procedures Review:    Pertinent radiology reviewed.

## 2017-02-02 ENCOUNTER — Encounter: Admit: 2017-02-02 | Discharge: 2017-02-02 | Payer: MEDICARE

## 2017-02-02 DIAGNOSIS — I214 Non-ST elevation (NSTEMI) myocardial infarction: Secondary | ICD-10-CM

## 2017-02-02 LAB — LIPID PROFILE: Lab: 155 mg/dL — ABNORMAL LOW (ref <200)

## 2017-02-02 LAB — CBC
Lab: 16 % — ABNORMAL HIGH (ref 11–15)
Lab: 164 10*3/uL (ref 150–400)
Lab: 2.7 M/UL — ABNORMAL LOW (ref 4.0–5.0)
Lab: 25 % — ABNORMAL LOW (ref 36–45)
Lab: 32 pg (ref 26–34)
Lab: 34 g/dL (ref 32.0–36.0)
Lab: 7.1 K/UL (ref 4.5–11.0)
Lab: 8.4 FL (ref 7–11)
Lab: 9 g/dL — ABNORMAL LOW (ref 12.0–15.0)
Lab: 92 FL (ref 80–100)
Lab: 143 10*3/uL — ABNORMAL LOW (ref 150–400)
Lab: 2.6 M/UL — ABNORMAL LOW (ref 4.0–5.0)
Lab: 24 % — ABNORMAL LOW (ref 36–45)
Lab: 30 pg (ref 26–34)
Lab: 6.4 10*3/uL — ABNORMAL LOW (ref 4.5–11.0)
Lab: 92 FL (ref 80–100)
Lab: 137 10*3/uL — ABNORMAL LOW (ref 150–400)
Lab: 16 % — ABNORMAL HIGH (ref 11–15)
Lab: 2.6 M/UL — ABNORMAL LOW (ref 4.0–5.0)
Lab: 24 % — ABNORMAL LOW (ref 36–45)
Lab: 30 pg (ref 26–34)
Lab: 33 g/dL (ref 32.0–36.0)
Lab: 5.4 10*3/uL (ref 4.5–11.0)
Lab: 7.7 FL (ref 7–11)
Lab: 8.1 g/dL — ABNORMAL LOW (ref 12.0–15.0)
Lab: 91 FL (ref 80–100)

## 2017-02-02 LAB — POC GLUCOSE
Lab: 229 mg/dL — ABNORMAL HIGH (ref 70–100)
Lab: 151 mg/dL — ABNORMAL HIGH (ref 70–100)
Lab: 196 mg/dL — ABNORMAL HIGH (ref 70–100)
Lab: 228 mg/dL — ABNORMAL HIGH (ref 70–100)

## 2017-02-02 LAB — BLOOD GASES, ARTERIAL
Lab: 2 MMOL/L
Lab: 26 MMOL/L (ref 21–28)
Lab: 38 mmHg — AB (ref 35–45)
Lab: 7.4 (ref 7.35–7.45)
Lab: 97 % (ref 95–99)

## 2017-02-02 LAB — PROTIME INR (PT): Lab: 1.3 — ABNORMAL HIGH (ref 0.8–1.2)

## 2017-02-02 LAB — PTT (APTT)
Lab: 24 s (ref 20.0–36.0)
Lab: 25 s (ref 20.0–36.0)
Lab: 35 s (ref 20.0–36.0)

## 2017-02-02 LAB — CULTURE-URINE W/SENSITIVITY
Lab: >10
Lab: >10 — AB

## 2017-02-02 LAB — LDH-LACTATE DEHYDROGENASE: Lab: 204 U/L — ABNORMAL LOW (ref 100–210)

## 2017-02-02 LAB — RETICULOCYTE COUNT: Lab: 4 % — ABNORMAL HIGH (ref >60)

## 2017-02-02 LAB — AMMONIA: Lab: 60 umol/L — ABNORMAL HIGH (ref 9–35)

## 2017-02-02 LAB — HAPTOGLOBIN: Lab: 144 mg/dL — ABNORMAL LOW (ref 16–200)

## 2017-02-02 LAB — COMPREHENSIVE METABOLIC PANEL: Lab: 133 MMOL/L — ABNORMAL LOW (ref >60)

## 2017-02-02 LAB — VITAMIN B12: Lab: 170 pg/mL — ABNORMAL HIGH (ref >40)

## 2017-02-02 LAB — MAGNESIUM: Lab: 1.8 mg/dL — ABNORMAL LOW (ref 1.6–2.6)

## 2017-02-02 MED ORDER — HEPARIN (PORCINE) 20,000 UNIT/250 ML IV INFUSION
INTRAVENOUS | 0 refills | Status: DC
Start: 2017-02-02 — End: 2017-02-04
  Administered 2017-02-02 (×2): 1200 [IU]/h via INTRAVENOUS
  Administered 2017-02-03 (×2): 1600 [IU]/h via INTRAVENOUS
  Administered 2017-02-03 (×2): 250.000 mL via INTRAVENOUS
  Administered 2017-02-04 (×2): 2000 [IU]/h via INTRAVENOUS

## 2017-02-02 MED ORDER — KETAMINE 10 MG/ML IJ SOLN
30 mg | Freq: Once | INTRAVENOUS | 0 refills | Status: DC
Start: 2017-02-02 — End: 2017-02-02

## 2017-02-02 MED ORDER — NITROFURANTOIN MONOHYD/M-CRYST 100 MG PO CAP
100 mg | Freq: Two times a day (BID) | ORAL | 0 refills | Status: DC
Start: 2017-02-02 — End: 2017-02-03
  Administered 2017-02-02 – 2017-02-03 (×2): 100 mg via ORAL

## 2017-02-02 MED ORDER — VANCOMYCIN 1,750 MG IVPB
15 mg/kg | Freq: Every day | INTRAVENOUS | 0 refills | Status: DC
Start: 2017-02-02 — End: 2017-02-03
  Administered 2017-02-02 (×2): 1750 mg via INTRAVENOUS

## 2017-02-02 MED ORDER — VANCOMYCIN PHARMACY TO MANAGE
1 | 0 refills | Status: DC
Start: 2017-02-02 — End: 2017-02-03

## 2017-02-02 MED ORDER — POTASSIUM CHLORIDE 20 MEQ PO TBTQ
20 meq | Freq: Once | ORAL | 0 refills | Status: CP
Start: 2017-02-02 — End: ?
  Administered 2017-02-02: 17:00:00 20 meq via ORAL

## 2017-02-02 MED ORDER — MAGNESIUM SULFATE IN D5W 1 GRAM/100 ML IV PGBK
1 g | Freq: Once | INTRAVENOUS | 0 refills | Status: CP
Start: 2017-02-02 — End: ?
  Administered 2017-02-02: 14:00:00 1 g via INTRAVENOUS

## 2017-02-02 MED ORDER — CEPHALEXIN 500 MG PO CAP
500 mg | Freq: Four times a day (QID) | ORAL | 0 refills | Status: DC
Start: 2017-02-02 — End: 2017-02-02

## 2017-02-02 MED ORDER — METOPROLOL TARTRATE 5 MG/5 ML IV SOLN
2.5 mg | INTRAVENOUS | 0 refills | Status: DC | PRN
Start: 2017-02-02 — End: 2017-02-03
  Administered 2017-02-03: 11:00:00 2.5 mg via INTRAVENOUS

## 2017-02-02 MED ORDER — LACTULOSE 10 GRAM/15 ML PO SOLN
15 mL | Freq: Three times a day (TID) | ORAL | 0 refills | Status: DC
Start: 2017-02-02 — End: 2017-02-04
  Administered 2017-02-02 – 2017-02-04 (×3): 10 g via ORAL

## 2017-02-02 MED ORDER — SODIUM CHLORIDE 0.9 % IV SOLP
INTRAVENOUS | 0 refills | Status: AC
Start: 2017-02-02 — End: ?

## 2017-02-02 MED ORDER — CEPHALEXIN 500 MG PO CAP
500 mg | ORAL | 0 refills | Status: DC
Start: 2017-02-02 — End: 2017-02-02

## 2017-02-02 NOTE — Progress Notes
Discussed current aPTT with Dr. Maple Hudson, given verbal okay to increase to 1200 units/hr at this time.

## 2017-02-02 NOTE — Progress Notes
Capsule      Post Capsule Instructions  You have just had a capsule endoscopy.  This sheet contains information about what to expect over the next two days.  Please call the Endoscopy Unit if you have severe or persistent abdominal pain, chest pain, fever, difficulty swallowing or if you have any other questions.      Our phone number is M-F [phone], after hours; evening and weekends call [phone] and ask for the GI Doctor on call.    -Pain: Pain is uncommon following a capsule endoscopy.  Should you feel sharp or persistent pain, please call the Endoscopy Unit.    -Nausea: Nausea is also very uncommon and should it occur, please notify the Endoscopy Unit.    -Activities: Following the exam, you may resume normal activities; refrain from exercise for the exercise.  Do not bend of stoop over.  You may walk, sit, and lay down.  You can drive a car.  You may return to work if your work allows avoiding unsuitable environments and/or physical movements.    -Medication: You may resume your medications at the time you can drink clear liquids as described in the diet section    -Further Testing: Until the capsule passes, further testing which includes any type of MRI should be avoided.  If you have an MRI examination scheduled within the next 3 days, the MRI examination should be postponed.    -The Capsule: The capsule passes naturally in a bowel movement, typically in about 24 hours.  Most likely, you will be unaware of its passage.   It does not need to be retrieved and can safely be flushed down the toilet.  Occasionally, the capsule lights will still be flashing when it passes.  Should you be concerned that the capsule did not pass, in the absence of symptoms; an abdominal x-ray can be obtained after 3 days to confirm its passage. If for any reason the machine stops blinking blue or changes colors please call the number below.    - You may loosen the belt to allow yourself to go to the bathroom.  Do not take the belt off for any reason.    - Observe the small light on top of the data recorder at least every 15 minutes.  If the light stops blinking please document the time and call our office.    The results of your exam will be sent to your referring physician.  He or she will contact you for further follow-up.      Please call us if you have severe or persistent abdominal or chest pain, fever, difficulty swallowing, or if you have any questions.  Our phone number is [phone].  Diet:  After  capsule was administered remain NPO (Nothing by mouth) for 2 hours.      2 hours after swallowing the capsule, you may have clear liquids.  Time:_______1055____________  4 hours after swallowing the capsule, you may have a light meal, you can take your meds at this time.           Time:_______1255____________  8 hours after swallowing the capsule  you need to return to Endoscopy unit to have the equipment removed.        Time:___________1655_________

## 2017-02-02 NOTE — Progress Notes
Dr. Orpha Bur at bedside to evaluate patient's RUE, where area near [facility] is red, swollen, painful, and indurated.

## 2017-02-02 NOTE — Progress Notes
Per Dr. Maple Hudson, okay to increase heparin gtt to 1000 units/hr at this time based on recent aPTT.

## 2017-02-02 NOTE — Progress Notes
Pharmacy Vancomycin Note  Subjective:   Diane Savage is a 61 y.o. female being treated for Cellulitis.    Objective:     Current Vancomycin Orders   Medication Dose Route Frequency    vancomycin (VANCOCIN) 1,750 mg in dextrose 5% (D5W) IVPB  15 mg/kg Intravenous QDAY    And    vancomycin, pharmacy to manage  1 each Service Per Pharmacy     Start Date of  Vancomycin therapy: 01/31/2017    White Blood Cells   Date/Time Value Ref Range Status   02/02/2017 0400 6.4 4.5 - 11.0 K/UL Final   02/01/2017 2100 7.1 4.5 - 11.0 K/UL Final   02/01/2017 1230 8.1 4.5 - 11.0 K/UL Final   02/01/2017 0500 7.9 4.5 - 11.0 K/UL Final   01/31/2017 2018 8.9 4.5 - 11.0 K/UL Final     Creatinine   Date/Time Value Ref Range Status   02/02/2017 0400 1.75 (H) 0.4 - 1.00 MG/DL Final   02/01/2017 0500 1.75 (H) 0.4 - 1.00 MG/DL Final   01/31/2017 2018 1.81 (H) 0.4 - 1.00 MG/DL Final     Blood Urea Nitrogen   Date/Time Value Ref Range Status   02/02/2017 0400 21 7 - 25 MG/DL Final     Estimated CrCl: 41.1 mL/min    Assessment:   Target levels for this patient: 10-15.    Plan:   1. Start vancomycin 1750mg  IV once daily. Patient received one time vanc 1750mg  on 8/31pm, will check level prior to 3rd dose of new regimen.  2. Next scheduled level(s): Prior to 3rd dose on 9/4.  3. Pharmacy will continue to monitor and adjust therapy as needed.    Lucianne Muss, [facility]  02/02/2017

## 2017-02-02 NOTE — Progress Notes
Critical Care Progress Note          Today's Date:  02/02/2017  Name:  Diane Savage                       MRN:  [id number]   Admission Date: 01/31/2017  LOS: 2 days                     Assessment/Plan:   Active Problems:    NSTEMI (non-ST elevated myocardial infarction) (HCC)    Type 2 diabetes mellitus with circulatory disorder, without long-term current use of insulin (HCC)    Atrial fibrillation (HCC)    Atrial fibrillation with rapid ventricular response (HCC)    Chronic gastrointestinal bleeding    Transfusion-dependent anemia    I have seen, personally fully evaluated, and discussed patient with the CICU team. 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  I spent 45 minutes (excluding time spent performing or supervising any procedures) providing and personally directing critical care services including ventilator management, pain mgt, hemodynamic monitoring and management, lab and radiology review, medication review and management, fluid and electrolyte management and coordination of care.       Donia Ast, MD  Anesthesia/Critical Care Medicine  Attending Physician  Pgr [phone]    1. Neuro: APAP PRN pain  2. CV: 3 vessel disease seen on OSH LHC after presenting with NSTEMI. CTS consult, appreciate recs, plan for CABG on Tuesday. Amiodarone started for afib w/RVR.  BPs borderline this AM, hold PTA toprol and ACEI.  Continue statin, ASA  3. Pulm: on 2 L NC, wean as tolerated.  Encourage IS. PFTs ordered for preop eval  4. GI: GI consult for acute on chronic blood loss anemia.  Appreciate recs.  Performing capsule endoscopy today, future interventions TBD. PPI BID  5. Renal: Cr 1.75, unclear baseline.  Avoid nephrotoxins as able.  Likely has UTI based on fevers and U/A, continue abx.   6. Endo: PTA synthroid 175 mcg. Hx DMII, check HbA1c, ensure BG<200.  Holding PTA oral hypoglycemics 7. Heme: acute on chronic blood loss anemia for which she had had multiple extensive workups that have demonstrated chronic gastritis and 2 tubular adenomas.  Receiving iron infusions and intermittent transfusions as an outpatient, and now has antibodies.  Goal Hb > 8 in setting of symptomatic anemia and NSTEMI, maintain active T&C.  On heparin gtt for ACS and afib indications with low PTT goal (50-70).  Has lower extremity superficial DVTs  8. ID: febrile overnight w/ altered mentation.  Has ? Cellulitis vs thrombophlebitis of R forearm, will continue Vanc and narrow as able based on UCx and E Coli sensitivities.  RUE doppler.  9. PPx: SCDs, PPI BID    Dispo: needs ICU    __________________________________________________________________________________  Subjective:  Diane Savage is a 61 y.o. female w/ PMH of chronic gastritis, DMII, afib, and transfusion-dependent anemia who presented with chest pain to an OSH.  LHC there demonstrated triple vessel disease in addition to Hb of 6.1, for which she received 2 units pRBCS.  She was transferred to [facility] for further evaluation.    Overnight events: RNs noticed pain and swelling in RUE distal to PICC site from where a prior OSH IV had been placed.  Undergoing bowel prep for capsule endoscopy today      Objective:  Medications:  Scheduled Meds:  aspirin chewable tablet 81 mg 81 mg Oral QDAY   insulin aspart U-100 (NOVOLOG FLEXPEN) injection PEN 0-7 Units 0-7 Units Subcutaneous ACHS   levothyroxine (SYNTHROID) tablet 175 mcg 175 mcg Oral QDAY(07)   magnesium sulfate   1 g/D5W 100 mL IVPB 1 g Intravenous ONCE   pantoprazole (PROTONIX) injection 80 mg 80 mg Intravenous BID(11-21)   piperacillin/tazobactam  (ZOSYN) 3.375 g/50 mL iso-osmotic IVPB 3.375 g Intravenous Q6H*   pneumococcal 23-val vaccine (PPSV23) (PNEUMOVAX 23) injection 0.5 mL 0.5 mL Intramuscular ONCE   potassium chloride SR (K-DUR) tablet 20 mEq 20 mEq Oral ONCE rosuvastatin (CRESTOR) tablet 20 mg 20 mg Oral QDAY   Continuous Infusions:  ??? amiodarone (CORDARONE) 360 mg in dextrose, iso-osm 200 mL infusion 0.5 mg/min (02/01/17 2339)   ??? heparin (porcine) 20,000 units/D5W 500 mL infusion (std conc)(premade) 1,000 Units/hr (02/02/17 4540)   ??? nitroGLYCERIN 50 mg/D5W 250 mL infusion Stopped (01/31/17 2105)     PRN and Respiratory Meds:acetaminophen Q4H PRN, ondansetron Q6H PRN                     Vital Signs: Last Filed                  Vital Signs: 24 Hour Range   BP: 95/31 (09/02 0600)  Temp: 37.9 ???C (100.2 ???F) (09/02 0400)  Pulse: 85 (09/02 0600)  Respirations: 19 PER MINUTE (09/01 2300)  SpO2: 98 % (09/02 0600)  O2 Delivery: None (Room Air) (09/02 0600)  Height: 162.6 cm (64) (09/01 1100)  Weight: 115 kg (253 lb 8.5 oz) (09/01 1100)  BP: (83-142)/(29-69)   Temp:  [36.8 ???C (98.3 ???F)-38.7 ???C (101.7 ???F)]   Pulse:  [73-127]   Respirations:  [9 PER MINUTE-22 PER MINUTE]   SpO2:  [92 %-100 %]   O2 Delivery: None (Room Air)    Intensity Pain Scale 0-10 (Pain 1): (not recorded) Vitals:    01/31/17 1944 02/01/17 0545 02/01/17 1100   Weight: 115 kg (253 lb 8.5 oz) 115.8 kg (255 lb 4.7 oz) 115 kg (253 lb 8.5 oz)       Critical Care Vitals:      ICP Monitoring:     PA  Catheter:     Hemodynamics/Oxycalcs:       Intake/Output Summary:  (Last 24 hours)    Intake/Output Summary (Last 24 hours) at 02/02/17 0716  Last data filed at 02/02/17 0300   Gross per 24 hour   Intake          2613.65 ml   Output             2960 ml   Net          -346.35 ml         Physical Exam:   General: obese female in NAD  Neck: no JVD  CV: irregular rate and rhythm  Chest: lungs CTAB  Abdomen: NTND, +NABS  Extremities: [facility]    Artificial airway:  None  Ventilator/ Respiratory Therapy:  No   Vent weaning trial:  Not applicable      Prophylaxis Review:  Lines:  No  Urinary Catheter:  No Antibiotic Usage:  Yes; Infection present or suspected:  GU/GI;  Urinary tract infection (UTI)  VTE:  Mechanical prophylaxis; Sequential compression device    Lab Review:  Pertinent labs reviewed  Point of Care Testing:  (Last 24 hours):  Glucose: (!) 137 (02/02/17 0400)  POC Glucose (Download): (!) 151 (02/02/17 8413)    Radiology and Other Diagnostic Procedures Review:    Pertinent radiology reviewed.

## 2017-02-02 NOTE — Progress Notes
Capsule Endoscopy Record    Indication: Anemia    Referring Physician: Dr. Buckles    BP (!) 113/35  - Pulse 94  - Temp 37.9 ???C (100.2 ???F)  - Ht 162.6 cm (64)  - Wt 115 kg (253 lb 8.5 oz)  - SpO2 98%  - BMI 43.52 kg/m???     Allergies   Allergen Reactions   ??? Diltiazem RASH, NAUSEA AND VOMITING and SEE COMMENTS     Sore mouth   ??? Sulfa (Sulfonamide Antibiotics) RASH   ??? Duricef [Cefadroxil] NAUSEA AND VOMITING   ??? Pravastatin NAUSEA AND VOMITING   ??? Simvastatin NAUSEA AND VOMITING         Current Facility-Administered Medications:   ???  acetaminophen (TYLENOL) tablet 650 mg, 650 mg, Oral, Q4H PRN, Hiram Gash, Rolly Salter, MD, 650 mg at 02/01/17 2121  ???  amiodarone (CORDARONE) 360 mg in dextrose, iso-osm 200 mL infusion, 0.5-1 mg/min, Intravenous, TITRATE, Masoomi, Reza, MD, Last Rate: 17 mL/hr at 02/02/17 0730, 0.5 mg/min at 02/02/17 0730  ???  aspirin chewable tablet 81 mg, 81 mg, Oral, QDAY, Leonia Reader, DO, 81 mg at 02/01/17 0943  ???  heparin (porcine) 20,000 units/D5W 500 mL infusion (std conc)(premade), 0-2,000 Units/hr, Intravenous, TITRATE, Vance Gather T, MD, Last Rate: 30 mL/hr at 02/02/17 0759, 1,200 Units/hr at 02/02/17 0759  ???  insulin aspart U-100 (NOVOLOG FLEXPEN) injection PEN 0-7 Units, 0-7 Units, Subcutaneous, ACHS, Billy Coast, MD, 2 Units at 02/01/17 2125  ???  levothyroxine (SYNTHROID) tablet 175 mcg, 175 mcg, Oral, QDAY(07), Slimmer, Theodis Sato, MD, 175 mcg at 02/02/17 0640  ???  magnesium sulfate   1 g/D5W 100 mL IVPB, 1 g, Intravenous, ONCE, Leonia Reader, DO, Last Rate: 100 mL/hr at 02/02/17 0850, 1 g at 02/02/17 0850  ???  nitroGLYCERIN 50 mg/D5W 250 mL infusion, 0.1-3 mcg/kg/min, Intravenous, TITRATE, Masoomi, Reza, MD, Stopped at 01/31/17 2105  ???  ondansetron (ZOFRAN) tablet 4 mg, 4 mg, Oral, Q6H PRN, Billy Coast, MD, 4 mg at 02/01/17 2309  ???  pantoprazole (PROTONIX) injection 80 mg, 80 mg, Intravenous, EAV(40-98), Billy Coast, MD, 80 mg at 02/01/17 2124 ???  piperacillin/tazobactam  (ZOSYN) 3.375 g/50 mL iso-osmotic IVPB, 3.375 g, Intravenous, Q6H*, Masoomi, Reza, MD, Last Rate: 100 mL/hr at 02/02/17 0545, 3.375 g at 02/02/17 0545  ???  pneumococcal 23-val vaccine (PPSV23) (PNEUMOVAX 23) injection 0.5 mL, 0.5 mL, Intramuscular, ONCE, Daymon Larsen, MD  ???  potassium chloride SR (K-DUR) tablet 20 mEq, 20 mEq, Oral, ONCE, Leonia Reader, DO  ???  rosuvastatin (CRESTOR) tablet 20 mg, 20 mg, Oral, QDAY, Billy Coast, MD, 20 mg at 02/01/17 0121    History of bowel obstruction:  No    Previous bowel or intestinal surgery:  No  If yes type:    Previous radiation therapy to abdomen:  No    Previous capsule endoscopy:  Yes     History of Crohn's disease:  No    History of Diabetes Mellitus:  Yes     Taking Aspirin:  No    Taking NSAIDS:  No    Pacemaker:  No    Automated Internal Defibrillator:  No    Post procedure instructions reviewed:  Yes     Consent signed:  Yes     H&P done:  Yes     Capsule administered:  Yes   Time: 0855    Time Recorder retrieved:    Time video creation initiated:

## 2017-02-02 NOTE — Progress Notes
Discussed with GI fellow Dr. Retia Passe about patient refusing to continue bowel prep. Per night RN, bowel movements not even close to clear at this time, described as a dark brown/black/green color. Informed GI fellow of this. He stated bowels did not need to be clear for capsule endoscopy and that GI nurse would assess when she came up to bedside to give patient capsule to swallow.

## 2017-02-03 LAB — CBC
Lab: 131 10*3/uL — ABNORMAL LOW (ref 150–400)
Lab: 16 % — ABNORMAL HIGH (ref 11–15)
Lab: 2.6 M/UL — ABNORMAL LOW (ref 4.0–5.0)
Lab: 30 pg (ref >60)
Lab: 33 g/dL (ref >60)
Lab: 4.5 K/UL — ABNORMAL LOW (ref 4.5–11.0)
Lab: 7.6 FL (ref 7–11)
Lab: 8 g/dL — ABNORMAL LOW (ref 12.0–15.0)
Lab: 92 FL (ref 80–100)
Lab: 128 10*3/uL — ABNORMAL LOW (ref 150–400)
Lab: 16 % — ABNORMAL HIGH (ref 11–15)
Lab: 2.5 M/UL — ABNORMAL LOW (ref 4.0–5.0)
Lab: 24 % — ABNORMAL LOW (ref 36–45)
Lab: 30 pg (ref 26–34)
Lab: 32 g/dL — ABNORMAL HIGH (ref 32.0–36.0)
Lab: 4.5 K/UL — ABNORMAL LOW (ref 4.5–11.0)
Lab: 7.8 g/dL — ABNORMAL LOW (ref 12.0–15.0)
Lab: 8 FL — ABNORMAL LOW (ref 7–11)
Lab: 93 FL (ref 80–100)
Lab: 147 10*3/uL — ABNORMAL LOW (ref 150–400)
Lab: 16 % — ABNORMAL HIGH (ref 11–15)
Lab: 28 % — ABNORMAL LOW (ref 36–45)
Lab: 3 M/UL — ABNORMAL LOW (ref 4.0–5.0)
Lab: 30 pg (ref 26–34)
Lab: 33 g/dL (ref 32.0–36.0)
Lab: 5.6 10*3/uL (ref 4.5–11.0)
Lab: 7.9 FL (ref 7–11)
Lab: 9.5 g/dL — ABNORMAL LOW (ref 12.0–15.0)
Lab: 93 FL (ref 80–100)

## 2017-02-03 LAB — POC GLUCOSE
Lab: 261 mg/dL — ABNORMAL HIGH (ref 70–100)
Lab: 273 mg/dL — ABNORMAL HIGH (ref 70–100)
Lab: 309 mg/dL — ABNORMAL HIGH (ref 70–100)
Lab: 267 mg/dL — ABNORMAL HIGH (ref 70–100)

## 2017-02-03 LAB — LIPID PROFILE: Lab: 153 mg/dL — ABNORMAL LOW (ref <200)

## 2017-02-03 LAB — PTT (APTT)
Lab: 41 s — ABNORMAL HIGH (ref 20.0–36.0)
Lab: 56 s — ABNORMAL HIGH (ref 20.0–36.0)
Lab: 61 s — ABNORMAL HIGH (ref 20.0–36.0)
Lab: 60 s — ABNORMAL HIGH (ref 20.0–36.0)

## 2017-02-03 LAB — AMMONIA: Lab: 70 umol/L — ABNORMAL HIGH (ref 9–35)

## 2017-02-03 LAB — HEPATITIS B CORE AB TOT (IGG+IGM): Lab: POSITIVE

## 2017-02-03 LAB — COMPREHENSIVE METABOLIC PANEL: Lab: 136 MMOL/L — ABNORMAL LOW (ref >40)

## 2017-02-03 LAB — HEPATITIS B SURFACE AB: Lab: >10 m[IU]/mL

## 2017-02-03 LAB — MAGNESIUM: Lab: 2 mg/dL — ABNORMAL LOW (ref >60)

## 2017-02-03 LAB — HEPATITIS PANEL, ACUTE
Lab: NEGATIVE
Lab: NEGATIVE
Lab: NEGATIVE
Lab: NEGATIVE

## 2017-02-03 MED ORDER — METOPROLOL SUCCINATE 50 MG PO TB24
50 mg | Freq: Every day | ORAL | 0 refills | Status: DC
Start: 2017-02-03 — End: 2017-02-04
  Administered 2017-02-03 – 2017-02-04 (×2): 50 mg via ORAL

## 2017-02-03 MED ORDER — INSULIN ASPART 100 UNIT/ML SC FLEXPEN
0-7 [IU] | Freq: Before meals | SUBCUTANEOUS | 0 refills | Status: DC
Start: 2017-02-03 — End: 2017-02-03

## 2017-02-03 MED ORDER — ROSUVASTATIN 20 MG PO TAB
40 mg | Freq: Every day | ORAL | 0 refills | Status: DC
Start: 2017-02-03 — End: 2017-02-16
  Administered 2017-02-04 – 2017-02-11 (×5): 40 mg via ORAL

## 2017-02-03 MED ORDER — INSULIN GLARGINE 100 UNIT/ML (3 ML) SC INJ PEN
5 [IU] | Freq: Every evening | SUBCUTANEOUS | 0 refills | Status: DC
Start: 2017-02-03 — End: 2017-02-04
  Administered 2017-02-04: 03:00:00 5 [IU] via SUBCUTANEOUS

## 2017-02-03 MED ORDER — AMIODARONE 200 MG PO TAB
200 mg | Freq: Two times a day (BID) | ORAL | 0 refills | Status: DC
Start: 2017-02-03 — End: 2017-02-04
  Administered 2017-02-03 – 2017-02-04 (×3): 200 mg via ORAL

## 2017-02-03 MED ORDER — POTASSIUM CHLORIDE 20 MEQ PO TBTQ
20 meq | Freq: Once | ORAL | 0 refills | Status: CP
Start: 2017-02-03 — End: ?
  Administered 2017-02-03: 13:00:00 20 meq via ORAL

## 2017-02-03 MED ORDER — INSULIN ASPART 100 UNIT/ML SC FLEXPEN
0-7 [IU] | Freq: Before meals | SUBCUTANEOUS | 0 refills | Status: DC
Start: 2017-02-03 — End: 2017-02-04

## 2017-02-03 MED ORDER — CEPHALEXIN 500 MG PO CAP
500 mg | Freq: Four times a day (QID) | ORAL | 0 refills | Status: DC
Start: 2017-02-03 — End: 2017-02-05
  Administered 2017-02-03 – 2017-02-04 (×4): 500 mg via ORAL

## 2017-02-03 NOTE — Progress Notes
Critical Care Progress Note          Today's Date:  02/03/2017  Name:  Diane Savage                       MRN:  [id number]   Admission Date: 01/31/2017  LOS: 3 days                     Assessment/Plan:   Active Problems:    NSTEMI (non-ST elevated myocardial infarction) (HCC)    Type 2 diabetes mellitus with circulatory disorder, without long-term current use of insulin (HCC)    Atrial fibrillation (HCC)    Atrial fibrillation with rapid ventricular response (HCC)    Chronic gastrointestinal bleeding    Transfusion-dependent anemia    I have seen, personally fully evaluated, and discussed patient with the CICU team. 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  I spent 45 minutes (excluding time spent performing or supervising any procedures) providing and personally directing critical care services including ventilator management, pain mgt, hemodynamic monitoring and management, lab and radiology review, medication review and management, fluid and electrolyte management and coordination of care.     Charlies Silvers, MD, MS, CCC/SLP 02/03/2017 10:11 AM  Clinical Instructor, Anesthesiology and Critical Care  [phone]    1. Neuro: APAP PRN pain  2. CV: 3 vessel disease seen on OSH LHC after presenting with NSTEMI. CTS consult, appreciate recs, plan for CABG on Tuesday. Amiodarone started for afib w/RVR - change to PO.  Bps normalized this AM, restart PTA toprol and ACEI.  Continue statin, ASA  3. Pulm: on 2 L NC, wean as tolerated.  Encourage IS. PFTs fev1/fvc = 1.35/1.70  4. GI: GI consult for acute on chronic blood loss anemia.  Appreciate recs.  Performing capsule endoscopy today, future interventions TBD. PPI Bid. Renal: Cr 1.75--> 1.20, unclear baseline.  Avoid nephrotoxins as able.  Likely has UTI based on fevers and U/A, continue abx. Dc lasix. Hold spironolactone. Repeat ammonia level, obtain hepatitis panel. 6. Endo: PTA synthroid 175 mcg. Hx DMII, check HbA1c, ensure BG<200.  Holding PTA oral hypoglycemics, insulin regimen started.  7. Heme: acute on chronic blood loss anemia for which she had had multiple extensive workups that have demonstrated chronic gastritis and 2 tubular adenomas.  Receiving iron infusions and intermittent transfusions as an outpatient, and now has antibodies.  Goal Hb > 8 in setting of symptomatic anemia and NSTEMI, maintain active T&C. 1u PRBC 02/03/17 for Hgb 7.8. On heparin gtt for ACS and afib indications with low PTT goal (50-70).  Has lower extremity superficial DVTs. Plt count slowly decreasing - will sent HIT Ab and continue heparin for now. Consider heme/onc c/s if HIT+  8. ID: febrile overnight w/ altered mentation.  Has ? Cellulitis vs thrombophlebitis of R forearm, will dc vanc and start Keflex with E Coli on UCx.  RUE doppler - with dvt in r cephalic vein.   9. PPx: SCDs, PPI BID    Dispo: needs ICU    __________________________________________________________________________________  Subjective:  Diane Savage is a 61 y.o. female w/ PMH of chronic gastritis, DMII, afib, and transfusion-dependent anemia who presented with chest pain to an OSH.  LHC there demonstrated triple vessel disease in addition to Hb of 6.1, for which she received 2 units pRBCS.  She was transferred to [facility] for further evaluation.  Overnight events: RUE with cephalic dvt.       Objective:  Medications:  Scheduled Meds:    aspirin chewable tablet 81 mg 81 mg Oral QDAY   insulin aspart U-100 (NOVOLOG FLEXPEN) injection PEN 0-7 Units 0-7 Units Subcutaneous ACHS   lactulose oral solution 10 g 15 mL Oral TID   levothyroxine (SYNTHROID) tablet 175 mcg 175 mcg Oral QDAY(07)   nitrofurantoin monohyd/m-cryst (MACROBID) capsule 100 mg 100 mg Oral BID   pantoprazole (PROTONIX) injection 80 mg 80 mg Intravenous BID(11-21)   pneumococcal 23-val vaccine (PPSV23) (PNEUMOVAX 23) injection 0.5 mL 0.5 mL Intramuscular ONCE rosuvastatin (CRESTOR) tablet 20 mg 20 mg Oral QDAY   vancomycin (VANCOCIN) 1,750 mg in dextrose 5% (D5W) IVPB 15 mg/kg Intravenous QDAY   Continuous Infusions:  ??? amiodarone (CORDARONE) 360 mg in dextrose, iso-osm 200 mL infusion 0.5 mg/min (02/03/17 0107)   ??? heparin (porcine) 20,000 Units in dextrose 5% (D5W) 250 mL IV infusion (dbl conc) 1,700 Units/hr (02/03/17 0719)   ??? nitroGLYCERIN 50 mg/D5W 250 mL infusion Stopped (01/31/17 2105)   ??? sodium chloride 0.9 %   infusion       PRN and Respiratory Meds:acetaminophen Q4H PRN, metoprolol Q6H PRN, ondansetron Q6H PRN, vancomycin   IVPB QDAY **AND** vancomycin, pharmacy to manage Per Pharmacy                     Vital Signs: Last Filed                  Vital Signs: 24 Hour Range   BP: 160/51 (09/03 1000)  Temp: 36.9 ???C (98.4 ???F) (09/03 0820)  Pulse: 84 (09/03 1000)  Respirations: 19 PER MINUTE (09/03 1000)  SpO2: 95 % (09/03 1000)  O2 Delivery: None (Room Air) (09/03 1000)  Weight: 113 kg (249 lb 1.9 oz) (09/03 0630)  BP: (99-160)/(38-77)   Temp:  [36.8 ???C (98.2 ???F)-37.2 ???C (98.9 ???F)]   Pulse:  [76-97]   Respirations:  [6 PER MINUTE-30 PER MINUTE]   SpO2:  [93 %-100 %]   O2 Delivery: None (Room Air)    Intensity Pain Scale 0-10 (Pain 1): (not recorded) Vitals:    02/01/17 1100 02/02/17 0400 02/03/17 0630   Weight: 115 kg (253 lb 8.5 oz) 115 kg (253 lb 8.5 oz) 113 kg (249 lb 1.9 oz)       Critical Care Vitals:      ICP Monitoring:     PA  Catheter:     Hemodynamics/Oxycalcs:       Intake/Output Summary:  (Last 24 hours)    Intake/Output Summary (Last 24 hours) at 02/03/17 1011  Last data filed at 02/03/17 0900   Gross per 24 hour   Intake          2190.55 ml   Output             1950 ml   Net           240.55 ml         Physical Exam:   General: obese female in NAD  Neck: no JVD  CV: irregular rate and rhythm  Chest: lungs CTAB  Abdomen: NTND, +NABS  Extremities: WWP    Artificial airway:  None Ventilator/ Respiratory Therapy:  No   Vent weaning trial:  Not applicable      Prophylaxis Review:  Lines:  No  Urinary Catheter:  No  Antibiotic Usage:  Yes; Infection present or suspected:  GU/GI;  Urinary tract  infection (UTI)  VTE:  Mechanical prophylaxis; Sequential compression device    Lab Review:  Pertinent labs reviewed  Point of Care Testing:  (Last 24 hours):  Glucose: (!) 255 (02/03/17 0428)  POC Glucose (Download): (!) 273 (02/03/17 9604)    Radiology and Other Diagnostic Procedures Review:    Pertinent radiology reviewed.

## 2017-02-03 NOTE — Progress Notes
1930: Assumed pt care at this time. Bedside safety check performed, plan of care reviewed.     2000: Physical assessment complete, please see ICU flowsheet for details. VSS. Capsule visualized in BM at this time. Heparin and Amiodarone infusing per MAR. Will continue to monitor.     0000: Patient reassessed at this time, see ICU flowsheet for details. VS per trends. Heparin and amiodarone infusing per MAR. Will continue to monitor.     0400: Patient reassessed at this time, see ICU flowsheet for details. VS per trends. Heparin and amiodarone infusing per MAR.Will continue to monitor.

## 2017-02-03 NOTE — Progress Notes
Patient c/o new R hip/leg numbness. Unrelieved with change of position. Neurovascular check of RLE: warm, 3 sec cap refill, appropriate skin color. CV1 team notified. Will continue to monitor.

## 2017-02-03 NOTE — Progress Notes
Heart Failure Nursing Progress Note    Admission Date: 01/31/2017  LOS: 3 days    Admission Weight: 115 kg (253 lb 8.5 oz)        Most recent weights (inpatient):   Vitals:    02/01/17 1100 02/02/17 0400 02/03/17 0630   Weight: 115 kg (253 lb 8.5 oz) 115 kg (253 lb 8.5 oz) 113 kg (249 lb 1.9 oz)     Weight change from previous day:2 kg    Fluid restriction ordered: yes     Intake/Output Summary: (Last 24 hours)    Intake/Output Summary (Last 24 hours) at 02/03/17 0657  Last data filed at 02/03/17 0500   Gross per 24 hour   Intake          1665.25 ml   Output             2350 ml   Net          -684.75 ml       Is patient incontinent No    Anticipated discharge date: unknown  Discharge goals: maintain fluid status       Daily Assessment of Patient Stated Goals:    Short Term Goal Identified by patient (Short Term=during hospitalization):  Education

## 2017-02-03 NOTE — Progress Notes
BS 309. This RN noticed that orders for Novolog were d/c'd.    CV1 paged. Awaiting orders at this time. Will continue to monitor.

## 2017-02-03 NOTE — Procedures
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

## 2017-02-04 ENCOUNTER — Encounter: Admit: 2017-02-04 | Discharge: 2017-02-04 | Payer: MEDICARE

## 2017-02-04 DIAGNOSIS — K319 Disease of stomach and duodenum, unspecified: ICD-10-CM

## 2017-02-04 DIAGNOSIS — H547 Unspecified visual loss: ICD-10-CM

## 2017-02-04 DIAGNOSIS — E039 Hypothyroidism, unspecified: ICD-10-CM

## 2017-02-04 DIAGNOSIS — E119 Type 2 diabetes mellitus without complications: ICD-10-CM

## 2017-02-04 DIAGNOSIS — I214 Non-ST elevation (NSTEMI) myocardial infarction: Secondary | ICD-10-CM

## 2017-02-04 DIAGNOSIS — M549 Dorsalgia, unspecified: ICD-10-CM

## 2017-02-04 DIAGNOSIS — I2510 Atherosclerotic heart disease of native coronary artery without angina pectoris: Principal | ICD-10-CM

## 2017-02-04 DIAGNOSIS — M1990 Unspecified osteoarthritis, unspecified site: ICD-10-CM

## 2017-02-04 DIAGNOSIS — D699 Hemorrhagic condition, unspecified: ICD-10-CM

## 2017-02-04 DIAGNOSIS — I10 Essential (primary) hypertension: ICD-10-CM

## 2017-02-04 LAB — PTT (APTT)
Lab: 59 s — ABNORMAL HIGH (ref 20.0–36.0)
Lab: 62 s — ABNORMAL HIGH (ref 20.0–36.0)

## 2017-02-04 LAB — POC IONIZED CALCIUM
Lab: 0.9 MMOL/L — ABNORMAL LOW (ref 1.0–1.3)
Lab: 1 MMOL/L (ref 1.0–1.3)
Lab: 1 MMOL/L (ref 1.0–1.3)
Lab: 1 MMOL/L (ref 1.0–1.3)
Lab: 1.1 MMOL/L (ref 1.0–1.3)
Lab: 1.1 MMOL/L (ref 1.0–1.3)
Lab: 1.2 MMOL/L (ref 1.0–1.3)

## 2017-02-04 LAB — POC GLUCOSE
Lab: 150 mg/dL — ABNORMAL HIGH (ref 70–100)
Lab: 177 mg/dL — ABNORMAL HIGH (ref 70–100)
Lab: 184 mg/dL — ABNORMAL HIGH (ref 70–100)
Lab: 194 mg/dL — ABNORMAL HIGH (ref 70–100)
Lab: 199 mg/dL — ABNORMAL HIGH (ref 70–100)
Lab: 199 mg/dL — ABNORMAL HIGH (ref 70–100)
Lab: 207 mg/dL — ABNORMAL HIGH (ref 70–100)
Lab: 209 mg/dL — ABNORMAL HIGH (ref 70–100)
Lab: 211 mg/dL — ABNORMAL HIGH (ref 70–100)
Lab: 216 mg/dL — ABNORMAL HIGH (ref 70–100)
Lab: 221 mg/dL — ABNORMAL HIGH (ref 70–100)
Lab: 351 mg/dL — ABNORMAL HIGH (ref 70–100)

## 2017-02-04 LAB — CBC
Lab: 125 10*3/uL — ABNORMAL LOW (ref 150–400)
Lab: 16 % — ABNORMAL HIGH (ref >60)
Lab: 2.8 M/UL — ABNORMAL LOW (ref 4.0–5.0)
Lab: 26 % — ABNORMAL LOW (ref 36–45)
Lab: 30 pg — ABNORMAL HIGH (ref 26–34)
Lab: 33 g/dL — ABNORMAL LOW (ref >60)
Lab: 4.5 K/UL — ABNORMAL LOW (ref 4.5–11.0)
Lab: 8.4 FL (ref 7–11)
Lab: 8.7 g/dL — ABNORMAL LOW (ref 12.0–15.0)
Lab: 93 FL (ref 80–100)
Lab: 126 K/UL — ABNORMAL LOW (ref 150–400)
Lab: 16 % — ABNORMAL HIGH (ref 11–15)
Lab: 31 pg (ref 26–34)
Lab: 33 g/dL — ABNORMAL LOW (ref 32.0–36.0)
Lab: 4.9 10*3/uL — ABNORMAL HIGH (ref 4.5–11.0)
Lab: 8.1 FL — ABNORMAL HIGH (ref 7–11)
Lab: 93 FL (ref 80–100)
Lab: 14 10*3/uL — ABNORMAL HIGH (ref 4.5–11.0)

## 2017-02-04 LAB — POC BLOOD GAS ARTERIAL
Lab: 0 MMOL/L
Lab: 100 % — ABNORMAL HIGH (ref 95–99)
Lab: 100 % — ABNORMAL HIGH (ref 95–99)
Lab: 100 % — ABNORMAL HIGH (ref 95–99)
Lab: 147 mmHg — ABNORMAL HIGH (ref 80–100)
Lab: 24 MMOL/L (ref 21–28)
Lab: 25 MMOL/L (ref 21–28)
Lab: 28 MMOL/L — ABNORMAL HIGH (ref 21–28)
Lab: 29 MMOL/L — ABNORMAL HIGH (ref 21–28)
Lab: 37 mmHg (ref 35–45)
Lab: 372 mmHg — ABNORMAL HIGH (ref 80–100)
Lab: 38 mmHg (ref 35–45)
Lab: 40 mmHg (ref 35–45)
Lab: 40 mmHg (ref 35–45)
Lab: 41 mmHg (ref 35–45)
Lab: 44 mmHg (ref 35–45)
Lab: 458 mmHg — ABNORMAL HIGH (ref 80–100)
Lab: 5 MMOL/L
Lab: 50 mmHg — ABNORMAL HIGH (ref 35–45)
Lab: 502 mmHg — ABNORMAL HIGH (ref 80–100)
Lab: 6 MMOL/L
Lab: 6 MMOL/L
Lab: 7.3 — ABNORMAL LOW (ref 7.35–7.45)
Lab: 7.4 (ref 7.35–7.45)
Lab: 7.4 (ref 7.35–7.45)
Lab: 7.4 — ABNORMAL HIGH (ref 7.35–7.45)
Lab: 7.4 — ABNORMAL HIGH (ref 7.35–7.45)
Lab: 7.5 — ABNORMAL HIGH (ref 7.35–7.45)
Lab: 7.5 — ABNORMAL HIGH (ref 7.35–7.45)
Lab: 99 % (ref 95–99)

## 2017-02-04 LAB — POC HEMATOCRIT
Lab: 10 g/dL — ABNORMAL LOW (ref 12.0–15.0)
Lab: 21 % — ABNORMAL LOW (ref 36–45)
Lab: 24 % — ABNORMAL LOW (ref 36–45)
Lab: 25 % — ABNORMAL LOW (ref 36–45)
Lab: 26 % — ABNORMAL LOW (ref 36–45)
Lab: 26 % — ABNORMAL LOW (ref 36–45)
Lab: 26 % — ABNORMAL LOW (ref 36–45)
Lab: 31 % — ABNORMAL LOW (ref 36–45)
Lab: 7.1 g/dL — ABNORMAL LOW (ref 12.0–15.0)
Lab: 8.2 g/dL — ABNORMAL LOW (ref 12.0–15.0)
Lab: 8.5 g/dL — ABNORMAL LOW (ref 12.0–15.0)
Lab: 8.8 g/dL — ABNORMAL LOW (ref 12.0–15.0)
Lab: 8.8 g/dL — ABNORMAL LOW (ref 12.0–15.0)
Lab: 8.8 g/dL — ABNORMAL LOW (ref 12.0–15.0)

## 2017-02-04 LAB — COMPREHENSIVE METABOLIC PANEL: Lab: 132 MMOL/L — ABNORMAL LOW (ref <100)

## 2017-02-04 LAB — POC SODIUM
Lab: 137 MMOL/L (ref 137–147)
Lab: 137 MMOL/L (ref 137–147)
Lab: 137 MMOL/L (ref 137–147)
Lab: 137 MMOL/L (ref 137–147)
Lab: 138 MMOL/L (ref 137–147)
Lab: 138 MMOL/L (ref 137–147)
Lab: 139 MMOL/L (ref 137–147)

## 2017-02-04 LAB — POC POTASSIUM
Lab: 4 MMOL/L (ref 3.5–5.1)
Lab: 4.1 MMOL/L — ABNORMAL HIGH (ref 3.5–5.1)
Lab: 4.2 MMOL/L (ref 3.5–5.1)
Lab: 4.4 MMOL/L — ABNORMAL LOW (ref >60)
Lab: 4.6 MMOL/L (ref 3.5–5.1)
Lab: 4.8 MMOL/L (ref 3.5–5.1)
Lab: 5.1 MMOL/L (ref 3.5–5.1)

## 2017-02-04 LAB — COPPER: Lab: 0.8 MMOL/L — ABNORMAL LOW (ref 3.5–5.1)

## 2017-02-04 LAB — HEPARIN INDUCED PLT AB (HIT)
Lab: 0 (ref 0.000–0.499)
Lab: NEGATIVE

## 2017-02-04 LAB — SOLUBLE TRANSFERRIN RECEPTOR: Lab: 4 MMOL/L — ABNORMAL LOW (ref >60)

## 2017-02-04 LAB — MAGNESIUM: Lab: 2.1 mg/dL — ABNORMAL LOW (ref >60)

## 2017-02-04 LAB — CELIAC SCREEN

## 2017-02-04 LAB — LIPID PROFILE: Lab: 142 mg/dL — ABNORMAL LOW (ref <200)

## 2017-02-04 LAB — LEAD LEVEL

## 2017-02-04 MED ORDER — VASOPRESSIN 20 UNITS IN 100ML IV INFUSION
.6-3.6 [IU]/h | Freq: Once | INTRAVENOUS | 0 refills | Status: CP
Start: 2017-02-04 — End: ?
  Administered 2017-02-04 (×2): 2.4 [IU]/h via INTRAVENOUS

## 2017-02-04 MED ORDER — ALUM-MAG HYDROXIDE-SIMETH 200-200-20 MG/5 ML PO SUSP
30 mL | ORAL | 0 refills | Status: DC | PRN
Start: 2017-02-04 — End: 2017-02-16

## 2017-02-04 MED ORDER — FAMOTIDINE (PF) 20 MG/2 ML IV SOLN
20 mg | Freq: Two times a day (BID) | INTRAVENOUS | 0 refills | Status: DC
Start: 2017-02-04 — End: 2017-02-04

## 2017-02-04 MED ORDER — DEXMEDETOMIDINE IV DRIP (STD CONC)
0.2-1 ug/kg/h | INTRAVENOUS | 0 refills | Status: DC
Start: 2017-02-04 — End: 2017-02-05
  Administered 2017-02-05 (×2): 1 ug/kg/h via INTRAVENOUS

## 2017-02-04 MED ORDER — LIDOCAINE (PF) 100 MG/5 ML (2 %) IV SYRG
1 mg/kg | INTRAVENOUS | 0 refills | Status: DC | PRN
Start: 2017-02-04 — End: 2017-02-08

## 2017-02-04 MED ORDER — AMINOCAPROIC ACID 12.5 GM INFUSION (OR)
INTRAVENOUS | 0 refills | Status: DC
Start: 2017-02-04 — End: 2017-02-04
  Administered 2017-02-04 (×2): 40 mL/h via INTRAVENOUS

## 2017-02-04 MED ORDER — POTASSIUM CHLORIDE 20 MEQ/15 ML PO LIQD
20-40 meq | NASOGASTRIC | 0 refills | Status: DC | PRN
Start: 2017-02-04 — End: 2017-02-16

## 2017-02-04 MED ORDER — SENNOSIDES-DOCUSATE SODIUM 8.6-50 MG PO TAB
2 | Freq: Two times a day (BID) | ORAL | 0 refills | Status: DC
Start: 2017-02-04 — End: 2017-02-16
  Administered 2017-02-05 – 2017-02-15 (×16): 2 via ORAL

## 2017-02-04 MED ORDER — NOREPINEPHRINE IV DRIP (STD CONC)
.01-.4 ug/kg/min | INTRAVENOUS | 0 refills | Status: DC
Start: 2017-02-04 — End: 2017-02-05
  Administered 2017-02-05 (×2): 0.04 ug/kg/min via INTRAVENOUS

## 2017-02-04 MED ORDER — NITROGLYCERIN IN 5 % DEXTROSE 50 MG/250 ML (200 MCG/ML) IV SOLN (INFUSION)(AM)(OR)
INTRAVENOUS | 0 refills | Status: DC
Start: 2017-02-04 — End: 2017-02-04
  Administered 2017-02-04: 19:00:00 .5 ug/kg/min via INTRAVENOUS

## 2017-02-04 MED ORDER — ALBUTEROL SULFATE 2.5 MG/0.5 ML IN NEBU
2.5 mg | RESPIRATORY_TRACT | 0 refills | Status: DC | PRN
Start: 2017-02-04 — End: 2017-02-05
  Administered 2017-02-05: 01:00:00 2.5 mg via RESPIRATORY_TRACT

## 2017-02-04 MED ORDER — ELECTROLYTE-A IV SOLP
0 refills | Status: DC
Start: 2017-02-04 — End: 2017-02-04
  Administered 2017-02-04: 18:00:00 via INTRAVENOUS

## 2017-02-04 MED ORDER — ATROPINE 0.1 MG/ML IJ SYRG
.5 mg | Freq: Once | INTRAVENOUS | 0 refills | Status: AC | PRN
Start: 2017-02-04 — End: ?

## 2017-02-04 MED ORDER — SODIUM CHLORIDE 0.9 % IV SOLP
INTRAVENOUS | 0 refills | Status: AC
Start: 2017-02-04 — End: ?
  Administered 2017-02-05: 12:00:00 1000.000 mL via INTRAVENOUS

## 2017-02-04 MED ORDER — AMINOCAPROIC ACID INFUSION
2 g/h | INTRAVENOUS | 0 refills | Status: AC
Start: 2017-02-04 — End: ?

## 2017-02-04 MED ORDER — PHENYLEPHRINE IV DRIP (STD CONC)
INTRAVENOUS | 0 refills | Status: DC
Start: 2017-02-04 — End: 2017-02-04
  Administered 2017-02-04 (×2): .2 ug/kg/min via INTRAVENOUS

## 2017-02-04 MED ORDER — FENTANYL CITRATE (PF) 50 MCG/ML IJ SOLN
25-50 ug | INTRAVENOUS | 0 refills | Status: DC | PRN
Start: 2017-02-04 — End: 2017-02-06
  Administered 2017-02-04 – 2017-02-05 (×6): 50 ug via INTRAVENOUS

## 2017-02-04 MED ORDER — NITROGLYCERIN 200MCG SYRINGE (OR)(OSM)
0 refills | Status: DC
Start: 2017-02-04 — End: 2017-02-04
  Administered 2017-02-04 (×2): 20 ug via INTRAVENOUS

## 2017-02-04 MED ORDER — CEFAZOLIN 1GM NS IRR
0 refills | Status: DC
Start: 2017-02-04 — End: 2017-02-04
  Administered 2017-02-04 (×2): 1000 mL

## 2017-02-04 MED ORDER — PROTAMINE 10 MG/ML IV SOLN
0 refills | Status: DC
Start: 2017-02-04 — End: 2017-02-04
  Administered 2017-02-04 (×2): 50 mg via INTRAVENOUS
  Administered 2017-02-04: 22:00:00 40 mg via INTRAVENOUS
  Administered 2017-02-04: 22:00:00 20 mg via INTRAVENOUS
  Administered 2017-02-04: 22:00:00 40 mg via INTRAVENOUS

## 2017-02-04 MED ORDER — PROPOFOL INJ 10 MG/ML IV VIAL
0 refills | Status: DC
Start: 2017-02-04 — End: 2017-02-04
  Administered 2017-02-04: 18:00:00 20 mg via INTRAVENOUS

## 2017-02-04 MED ORDER — MAGNESIUM HYDROXIDE 2,400 MG/10 ML PO SUSP
10 mL | Freq: Every day | ORAL | 0 refills | Status: DC | PRN
Start: 2017-02-04 — End: 2017-02-16
  Administered 2017-02-07 – 2017-02-15 (×4): 10 mL via ORAL

## 2017-02-04 MED ORDER — VECURONIUM BROMIDE 10 MG IV SOLR
INTRAVENOUS | 0 refills | Status: DC
Start: 2017-02-04 — End: 2017-02-04
  Administered 2017-02-04: 19:00:00 4 mg via INTRAVENOUS
  Administered 2017-02-04: 18:00:00 10 mg via INTRAVENOUS

## 2017-02-04 MED ORDER — ONDANSETRON HCL 4 MG PO TAB
4 mg | ORAL | 0 refills | Status: DC | PRN
Start: 2017-02-04 — End: 2017-02-06

## 2017-02-04 MED ORDER — AMINOCAPROIC ACID 250 MG/ML IV SOLN
INTRAVENOUS | 0 refills | Status: DC
Start: 2017-02-04 — End: 2017-02-04
  Administered 2017-02-04: 18:00:00 5 g via INTRAVENOUS

## 2017-02-04 MED ORDER — FENTANYL CITRATE (PF) 50 MCG/ML IJ SOLN
0 refills | Status: DC
Start: 2017-02-04 — End: 2017-02-04
  Administered 2017-02-04: 22:00:00 150 ug via INTRAVENOUS
  Administered 2017-02-04: 18:00:00 250 ug via INTRAVENOUS
  Administered 2017-02-04: 19:00:00 100 ug via INTRAVENOUS
  Administered 2017-02-04: 18:00:00 250 ug via INTRAVENOUS

## 2017-02-04 MED ORDER — ACETAMINOPHEN 1,000 MG/100 ML (10 MG/ML) IV SOLN
1000 mg | Freq: Once | INTRAVENOUS | 0 refills | Status: CP
Start: 2017-02-04 — End: ?
  Administered 2017-02-05: 04:00:00 1000 mg via INTRAVENOUS

## 2017-02-04 MED ORDER — MAGNESIUM SULFATE IN WATER 4 GRAM/50 ML (8 %) IV PGBK
4 g | Freq: Once | INTRAVENOUS | 0 refills | Status: CP
Start: 2017-02-04 — End: ?
  Administered 2017-02-05: 4 g via INTRAVENOUS

## 2017-02-04 MED ORDER — ONDANSETRON HCL (PF) 4 MG/2 ML IJ SOLN
4 mg | INTRAVENOUS | 0 refills | Status: DC | PRN
Start: 2017-02-04 — End: 2017-02-06
  Administered 2017-02-05 (×2): 4 mg via INTRAVENOUS

## 2017-02-04 MED ORDER — VASOPRESSIN 20 UNITS IN 100ML IV INFUSION
.6-3.6 [IU]/h | INTRAVENOUS | 0 refills | Status: DC
Start: 2017-02-04 — End: 2017-02-08
  Administered 2017-02-05 (×4): 3 [IU]/h via INTRAVENOUS
  Administered 2017-02-05 (×2): 2.4 [IU]/h via INTRAVENOUS
  Administered 2017-02-06 (×2): 3 [IU]/h via INTRAVENOUS
  Administered 2017-02-06: 1.6 [IU]/h via INTRAVENOUS
  Administered 2017-02-06 (×4): 3 [IU]/h via INTRAVENOUS
  Administered 2017-02-06: 1.6 [IU]/h via INTRAVENOUS

## 2017-02-04 MED ORDER — DIPHENHYDRAMINE HCL 25 MG PO CAP
25 mg | Freq: Once | ORAL | 0 refills | Status: CP
Start: 2017-02-04 — End: ?
  Administered 2017-02-04: 15:00:00 25 mg via ORAL

## 2017-02-04 MED ORDER — ALBUMIN, HUMAN 5 % IV SOLP
500 mL | Freq: Once | INTRAVENOUS | 0 refills | Status: CP
Start: 2017-02-04 — End: ?
  Administered 2017-02-04: 250 mL via INTRAVENOUS

## 2017-02-04 MED ORDER — DIPHENHYDRAMINE HCL 50 MG/ML IJ SOLN
50 mg | Freq: Once | INTRAVENOUS | 0 refills | Status: DC
Start: 2017-02-04 — End: 2017-02-04

## 2017-02-04 MED ORDER — IRON SUCROSE 100 MG IRON/5 ML IV SOLN
200 mg | Freq: Once | INTRAVENOUS | 0 refills | Status: CP
Start: 2017-02-04 — End: ?
  Administered 2017-02-04: 15:00:00 200 mg via INTRAVENOUS

## 2017-02-04 MED ORDER — ACETAMINOPHEN 500 MG PO TAB
1000 mg | ORAL | 0 refills | Status: AC
Start: 2017-02-04 — End: ?
  Administered 2017-02-05 – 2017-02-08 (×11): 1000 mg via ORAL

## 2017-02-04 MED ORDER — DEXMEDETOMIDINE IV DRIP (STD CONC)
0 refills | Status: DC
Start: 2017-02-04 — End: 2017-02-04
  Administered 2017-02-04 (×2): .7 ug/kg/h via INTRAVENOUS

## 2017-02-04 MED ORDER — ACETAMINOPHEN 325 MG PO TAB
650 mg | Freq: Once | ORAL | 0 refills | Status: CP
Start: 2017-02-04 — End: ?
  Administered 2017-02-04: 15:00:00 650 mg via ORAL

## 2017-02-04 MED ORDER — VANCOMYCIN 1,000 MG IV SOLR
0 refills | Status: DC
Start: 2017-02-04 — End: 2017-02-04
  Administered 2017-02-04 (×2): 1 g via TOPICAL

## 2017-02-04 MED ORDER — EPINEPHRINE HCL (PF) 1 MG/ML (1 ML) IJ SOLN
.3 mg | INTRAMUSCULAR | 0 refills | Status: DC | PRN
Start: 2017-02-04 — End: 2017-02-04

## 2017-02-04 MED ORDER — FAMOTIDINE 20 MG PO TAB
20 mg | Freq: Two times a day (BID) | ORAL | 0 refills | Status: DC
Start: 2017-02-04 — End: 2017-02-04

## 2017-02-04 MED ORDER — CEFAZOLIN INJ 1GM IVP
2 g | INTRAVENOUS | 0 refills | Status: DC
Start: 2017-02-04 — End: 2017-02-05

## 2017-02-04 MED ORDER — DIPHENHYDRAMINE HCL 50 MG/ML IJ SOLN
25 mg | INTRAVENOUS | 0 refills | Status: DC | PRN
Start: 2017-02-04 — End: 2017-02-04

## 2017-02-04 MED ORDER — INSULIN 100UNITS NS 100ML
INTRAVENOUS | 0 refills | Status: DC
Start: 2017-02-04 — End: 2017-02-04
  Administered 2017-02-04 (×2): 4 [IU]/h via INTRAVENOUS

## 2017-02-04 MED ORDER — THROMBIN (BOVINE) 5,000 UNIT TP SOLR
0 refills | Status: DC
Start: 2017-02-04 — End: 2017-02-04
  Administered 2017-02-04: 19:00:00 5000 [IU] via TOPICAL

## 2017-02-04 MED ORDER — INSULIN 100UNITS NS 100ML
1-32 [IU]/h | INTRAVENOUS | 0 refills | Status: DC
Start: 2017-02-04 — End: 2017-02-07
  Administered 2017-02-05: 01:00:00 5 [IU]/h via INTRAVENOUS
  Administered 2017-02-06 (×2): 3 [IU]/h via INTRAVENOUS
  Administered 2017-02-07 (×2): 4 [IU]/h via INTRAVENOUS

## 2017-02-04 MED ORDER — CEFAZOLIN 1 GRAM IJ SOLR
0 refills | Status: DC
Start: 2017-02-04 — End: 2017-02-04
  Administered 2017-02-04 (×2): 2 g via INTRAVENOUS

## 2017-02-04 MED ORDER — NITROGLYCERIN IN 5 % DEXTROSE 50 MG/250 ML (200 MCG/ML) IV SOLN
.1-3 ug/kg/min | INTRAVENOUS | 0 refills | Status: DC
Start: 2017-02-04 — End: 2017-02-08

## 2017-02-04 MED ORDER — HEPARIN (PORCINE) 1,000 UNIT/ML IJ SOLN
INTRAVENOUS | 0 refills | Status: DC
Start: 2017-02-04 — End: 2017-02-04
  Administered 2017-02-04: 19:00:00 30000 [IU] via INTRAVENOUS

## 2017-02-04 MED ORDER — BISACODYL 10 MG RE SUPP
10 mg | Freq: Every day | RECTAL | 0 refills | Status: DC | PRN
Start: 2017-02-04 — End: 2017-02-16
  Administered 2017-02-15: 18:00:00 10 mg via RECTAL

## 2017-02-04 MED ORDER — ACETAMINOPHEN 325 MG PO TAB
650 mg | ORAL | 0 refills | Status: DC | PRN
Start: 2017-02-04 — End: 2017-02-16
  Administered 2017-02-09 – 2017-02-15 (×9): 650 mg via ORAL

## 2017-02-04 MED ORDER — NOREPINEPHRINE IV DRIP (STD CONC)
INTRAVENOUS | 0 refills | Status: DC
Start: 2017-02-04 — End: 2017-02-04
  Administered 2017-02-04 (×2): .02 ug/kg/min via INTRAVENOUS

## 2017-02-04 MED ORDER — ACETAMINOPHEN 650 MG RE SUPP
650 mg | RECTAL | 0 refills | Status: DC | PRN
Start: 2017-02-04 — End: 2017-02-10

## 2017-02-04 MED ORDER — HEPARIN 20000UNITS PLASMALYTE-A 1000ML IRR(OR)
0 refills | Status: DC
Start: 2017-02-04 — End: 2017-02-04
  Administered 2017-02-04 (×2): 500 mL

## 2017-02-04 MED ORDER — SODIUM CHLORIDE 0.9 % IV SOLP
0 refills | Status: CP
Start: 2017-02-04 — End: ?
  Administered 2017-02-04: 19:00:00 4000 mL
  Administered 2017-02-04: 22:00:00 1000 mL

## 2017-02-04 MED ORDER — OXYCODONE 5 MG PO TAB
5-10 mg | ORAL | 0 refills | Status: DC | PRN
Start: 2017-02-04 — End: 2017-02-09
  Administered 2017-02-05: 14:00:00 10 mg via ORAL
  Administered 2017-02-05: 11:00:00 5 mg via ORAL
  Administered 2017-02-06: 12:00:00 10 mg via ORAL
  Administered 2017-02-07 (×4): 5 mg via ORAL
  Administered 2017-02-08: 06:00:00 10 mg via ORAL
  Administered 2017-02-08 – 2017-02-09 (×3): 5 mg via ORAL

## 2017-02-04 MED ORDER — HYDRALAZINE 20 MG/ML IJ SOLN
10 mg | INTRAVENOUS | 0 refills | Status: DC | PRN
Start: 2017-02-04 — End: 2017-02-16

## 2017-02-04 MED ORDER — CEFTRIAXONE INJ 1GM IVP
1 g | INTRAVENOUS | 0 refills | Status: CP
Start: 2017-02-04 — End: ?
  Administered 2017-02-05 – 2017-02-11 (×7): 1 g via INTRAVENOUS

## 2017-02-04 MED ORDER — AUTOLOGOUS BLOOD (CELL SAVER)
0 refills | Status: DC
Start: 2017-02-04 — End: 2017-02-04

## 2017-02-04 MED ORDER — POTASSIUM CHLORIDE IN WATER 10 MEQ/50 ML IV PGBK
10 meq | INTRAVENOUS | 0 refills | Status: DC | PRN
Start: 2017-02-04 — End: 2017-02-16
  Administered 2017-02-07: 09:00:00 10 meq via INTRAVENOUS

## 2017-02-04 MED ORDER — POTASSIUM CHLORIDE 20 MEQ PO TBTQ
20-40 meq | ORAL | 0 refills | Status: DC | PRN
Start: 2017-02-04 — End: 2017-02-16
  Administered 2017-02-10: 12:00:00 20 meq via ORAL
  Administered 2017-02-11 – 2017-02-13 (×3): 40 meq via ORAL
  Administered 2017-02-14: 14:00:00 20 meq via ORAL
  Administered 2017-02-15: 11:00:00 40 meq via ORAL

## 2017-02-04 MED ORDER — SODIUM CHLORIDE 0.9 % IV SOLP
INTRAVENOUS | 0 refills | Status: DC
Start: 2017-02-04 — End: 2017-02-04
  Administered 2017-02-04 (×2): 1000.000 mL via INTRAVENOUS

## 2017-02-04 MED ORDER — VASOPRESSIN 20 UNIT/ML IV SOLN
0 refills | Status: DC
Start: 2017-02-04 — End: 2017-02-04
  Administered 2017-02-04: 23:00:00 1 [IU] via INTRAVENOUS

## 2017-02-04 MED ORDER — ALBUTEROL SULFATE 2.5 MG /3 ML (0.083 %) IN NEBU
2.5 mg | RESPIRATORY_TRACT | 0 refills | Status: DC | PRN
Start: 2017-02-04 — End: 2017-02-05

## 2017-02-04 MED ORDER — DESMOPRESSIN IVPB
.3 ug/kg | Freq: Once | INTRAVENOUS | 0 refills | Status: CP
Start: 2017-02-04 — End: ?
  Administered 2017-02-04 (×2): 50.000 mL via INTRAVENOUS

## 2017-02-04 MED ORDER — ASPIRIN 81 MG PO CHEW
81 mg | Freq: Every day | ORAL | 0 refills | Status: DC
Start: 2017-02-04 — End: 2017-02-16
  Administered 2017-02-05 – 2017-02-15 (×10): 81 mg via ORAL

## 2017-02-04 MED ORDER — MIDAZOLAM 1 MG/ML IJ SOLN
INTRAVENOUS | 0 refills | Status: DC
Start: 2017-02-04 — End: 2017-02-04
  Administered 2017-02-04 (×2): 1 mg via INTRAVENOUS

## 2017-02-04 MED ORDER — LIDOCAINE (PF) 10 MG/ML (1 %) IJ SOLN
.1-2 mL | INTRAMUSCULAR | 0 refills | Status: DC | PRN
Start: 2017-02-04 — End: 2017-02-04

## 2017-02-04 MED ORDER — IPRATROPIUM BROMIDE 0.02 % IN SOLN
0.5 mg | RESPIRATORY_TRACT | 0 refills | Status: DC | PRN
Start: 2017-02-04 — End: 2017-02-05
  Administered 2017-02-05: 01:00:00 0.5 mg via RESPIRATORY_TRACT

## 2017-02-04 MED ADMIN — LIDOCAINE (PF) 10 MG/ML (1 %) IJ SOLN [95838]: 2 mL | INTRAMUSCULAR | @ 17:00:00 | Stop: 2017-02-04 | NDC 63323049227

## 2017-02-04 NOTE — Progress Notes
Pt arrived to [facility] w/ CVOR assist. Pt sedated/intubated w/ use of precedex. Pupils round/reactive. Temp 36.5.   HR A-paced per epicardial wires ~80s, BP labile ~80-120s, levophed and vasopressin titrated appropriately. CI ~2.5, PAPs ~40-50s/20s, CVP ~13-15, pulses palpable, non-pitting edema peripherally.   Lungs clear/diminished, tolerating MV settings of AC 100%, Vt 440, RR 17, PEEP 5.   OG in place, LIWS, minimal output  UOP adequate, clear/yellow   CT output per x2 meds, sanguinous, -20 suction ~15-25ml q36min   CT output per L pleural, sanguinous, -20 suction ~10-58ml/hr q60min  Wound vac per mid-sternal in place, minimal output.  Gtts: amicar, insulin, levophed, vasopressin, precedex     Orders per Dr. Neila Gear to give 582ml 5% albumin.

## 2017-02-04 NOTE — Progress Notes
Dr. Alfonso Ramus at bedside. Pt consented for surgery, all questions answered. Vital signs stable, pt connected to transport monitor, taken to preop. Family updated on plan of care. Belongings taken to CTI rm 2.

## 2017-02-04 NOTE — Progress Notes
RESPIRATORY THERAPY  ADULT PROTOCOL EVALUATION      RESPIRATORY PROTOCOL PLAN    Medications       Note: If indicated by protocol, medication orders will be placed by therapist.    Procedures  IPPB: Place a nursing order for "IS Q1h While Awake" for any of Lung Expansion indicators  Oxygen/Humidity: O2 to keep SpO2 > 92%  Monitoring: Pulse oximetry BID & PRN       _____________________________________________________________    PATIENT EVALUATION RESULTS    Chart Review  * Pulmonary Hx: Smoker in home OR smoking cessation > 8 weeks    * Surgical Hx: No surgery OR last surgery > 6 weeks ago OR trach/stoma (BA)    * Chest X-Ray: Clear OR not available    * PFT/Oxygenation: FEV1, PEFR < 70% OR Pa02 < 70 RA OR Sp02 <92% RA OR Fi02 > 0.21 to keep Sp02 > 92% OR < 24 hours post-op (02 & oxim) OR chronic C02 retention (C02)      Patient Assessment  * Respiratory Pattern: Regular pattern and rate OR good chest excursion with deep breathing    * Breath Sounds: Clear apically, but diminished in bases (LE) OR CHF related crackles (02) (oximetry)    * Cough / Sputum: Strong, effective cough OR nonproductive    * Mental Status: Alert, oriented, cooperative    * Activity Level: Ambulatory with assistance      Priority Index  Total Points: 5 Points  * Priority Index: 1    PRIORITY INDEX GUIDELINES*  Priority Points   1 0-9 points   2 9-18 points   3 > 18 points   + Pulm Dx or Home Rx   *Higher points indicate higher acuity.      Therapist: Harrison Mons, RT  Date: 02/03/2017      Key  AC = Airway clearance  AM = Aerosolized medication  BA = Bland aerosol  DB&C = Deep breathe & cough  FEV1 = Forced expiratory volume in first second)  IC = Inspiratory capacity  LE = Lung expansion  MDI = Metered dose inhaler  Neb = Nebulizer  O2 = Oxygen  Oxim =Oximetry  PEFR = Peak expiratory flow rate  RRT = Rapid Response Team

## 2017-02-04 NOTE — Anesthesia Pre-Procedure Evaluation
Anesthesia Pre-Procedure Evaluation    Name: Diane Savage      MRN: [id number]     DOB: 1955-06-14     Age: 61 y.o.     Sex: female   __________________________________________________________________________     Procedure Date: 02/04/2017   Procedure: Procedure(s):  CORONARY ARTERY BYPASS WITH ARTERIAL GRAFT - 3 GRAFTS (Internal Mammary Artery and Endovascular Vein Harvest)  MAZE PROCEDURE     Physical Assessment  Vital Signs (last filed in past 24 hours):  BP: 135/48 (09/04 1145)  Temp: 36.8 ???C (98.2 ???F) (09/04 1145)  Pulse: 67 (09/04 1145)  Respirations: 16 PER MINUTE (09/04 1145)  SpO2: 99 % (09/04 1145)  O2 Delivery: None (Room Air) (09/04 1130)  Height: 162.6 cm (64.02) (09/04 1130)  Weight: 113.5 kg (250 lb 3.2 oz) (09/04 1130)  Dosing / Dry Weight: 113.5 kg (250 lb 3.2 oz) (09/04 1130)      Patient History  Allergies   Allergen Reactions   ??? Diltiazem RASH, NAUSEA AND VOMITING and SEE COMMENTS     Sore mouth   ??? Sulfa (Sulfonamide Antibiotics) RASH   ??? Duricef [Cefadroxil] NAUSEA AND VOMITING   ??? Pravastatin NAUSEA AND VOMITING   ??? Simvastatin NAUSEA AND VOMITING        Current Medications    Medication Directions   cetirizine (ZYRTEC) 10 mg tablet Take 10 mg by mouth every morning.   diphenhydrAMINE (BENADRYL) 25 mg capsule Take 25 mg by mouth at bedtime as needed.   enalapril (VASOTEC) 5 mg tablet Take 5 mg by mouth daily.   folic acid (FOLVITE) 1 mg tablet Take 1 mg by mouth daily.   furosemide (LASIX) 20 mg tablet Take 20 mg by mouth every morning.   glimepiride (AMARYL) 2 mg tablet Take 2 mg by mouth twice daily.   GLUCOSAMINE HCL/CHONDROITIN SU (GLUCOSAMINE-CHONDROITIN PO) Take 1 tablet by mouth twice daily.   levothyroxine (SYNTHROID) 175 mcg tablet Take 175 mcg by mouth daily 30 minutes before breakfast.   metFORMIN (GLUCOPHAGE) 500 mg tablet Take 1,000 mg by mouth twice daily with meals.   metoprolol XL (TOPROL XL) 100 mg extended release tablet Take 100 mg by mouth twice daily. nitroglycerin (NITROSTAT) 0.4 mg tablet Place 0.4 mg under tongue every 5 minutes as needed for Chest Pain. Max of 3 tablets, call 911.   omeprazole DR(+) (PRILOSEC) 40 mg capsule Take 40 mg by mouth twice daily.   ondansetron (ZOFRAN) 8 mg tablet Take 8 mg by mouth every 8 hours as needed for Nausea or Vomiting.   promethazine (PHENERGAN) 25 mg tablet Take 25 mg by mouth every 6 hours as needed for Nausea or Vomiting.   sucralfate (CARAFATE) 1 gram tablet Take 1 g by mouth four times daily. Take on an empty stomach.   traMADol (ULTRAM) 50 mg tablet Take 50 mg by mouth every 6 hours as needed for Pain.         Review of Systems/Medical History      Patient summary reviewed  Nursing notes reviewed  Pertinent labs reviewed    PONV Screening: Female gender, Non-smoker, Hx PONV/motion sickness and Postoperative opioids  No history of anesthetic complications  No family history of anesthetic complications      Airway - negative        Pulmonary           Sleep apnea (OSA observed with hospitalization, no formal sleep study or diagnosis)      Cardiovascular  Recent diagnostic studies:          echocardiogram          1.  Technically difficult study despite use of echo contrast agent.  2.  Moderately dilated left ventricle.  Ejection fraction appears to be preserved.  3.  Moderately dilated right ventricle.  Unable to assess function.  4.  Normal pulmonary artery pressure.  5.  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 No evidence of active GI bleeding at this time. Neuro/Psych         Recent MS changes, improved at this time.      Musculoskeletal         Back pain      Arthritis      Endocrine/Other       Diabetes, type 2      Hypothyroidism      Anemia   Physical Exam    Airway Findings      Mallampati: II      TM distance: >3 FB      Neck ROM: full      Mouth opening: good      Airway patency: adequate    Dental Findings:       Upper dentures    Cardiovascular Findings:       Rhythm: regular      Rate: normal    Pulmonary Findings:       Breath sounds clear to auscultation.    Abdominal Findings:       Obese    Neurological Findings: Negative         Diagnostic Tests  Hematology:   Lab Results   Component Value Date    HGB 9.0 02/04/2017    HCT 27.0 02/04/2017    PLTCT 126 02/04/2017    WBC 4.9 02/04/2017    NEUT 70 02/01/2017    ANC 5.60 02/01/2017    ALC 1.20 02/01/2017    MONA 12 02/01/2017    AMC 1.00 02/01/2017    EOSA 3 02/01/2017    ABC 0.00 02/01/2017    MCV 93.5 02/04/2017    MCH 31.2 02/04/2017    MCHC 33.4 02/04/2017    MPV 8.1 02/04/2017    RDW 16.2 02/04/2017         General Chemistry:   Lab Results   Component Value Date    NA 132 02/04/2017    K 4.0 02/04/2017    CL 101 02/04/2017    CO2 26 02/04/2017    GAP 5 02/04/2017    BUN 12 02/04/2017    CR 1.05 02/04/2017    GLU 221 02/04/2017    CA 8.5 02/04/2017    ALBUMIN 3.2 02/04/2017    LACTIC 1.3 02/01/2017    MG 2.1  02/04/2017    TOTBILI 0.7 02/04/2017    PO4 1.8 02/01/2017      Coagulation:   Lab Results   Component Value Date    PTT 62.3 02/04/2017    INR 1.3 02/02/2017     Cath report:   - 80% distal left main,                - moderate LAD disease               -  LCx 80% ostial 70%               -  RCA with 50% lesion and distal 70% disease.                - Aortic pressure 104/52, LVp 109/28, LVEDP , normal LV function    Anesthesia Plan    ASA score: 4   Plan: general and invasive monitoring  Special equipment/procedures: TEE  Induction method: intravenous  NPO status: acceptable Informed Consent  Anesthetic plan and risks discussed with patient.  Use of blood products discussed with patient  Blood Consent: consented      Plan discussed with: CRNA.

## 2017-02-04 NOTE — Progress Notes
Patient is stable at this point.  No further angina.  Much more awake today, sitting in chair.    We will proceed with CABG later today.  I outlined the procedure in detail to her, including risks: infx, bleeding, cva, mi, renal filure, death, etc.    She understands and agrees to proceed.  Blood is available for transfusion now per BB (has JK antibodies).    JBK

## 2017-02-04 NOTE — Anesthesia Post-Procedure Evaluation
Post-Anesthesia Evaluation    Name: Diane Savage      MRN: [id number]     DOB: 1955-07-11     Age: 61 y.o.     Sex: female   __________________________________________________________________________     Procedure Date: 02/04/2017  Procedure: Procedure(s):  CORONARY ARTERY BYPASS WITH ARTERIAL GRAFT - 4 GRAFTS (Internal Mammary Artery and Endovascular Vein Harvest)  MAZE PROCEDURE      Surgeon: Surgeon(s):  Whitsitt, Lauren, PA-C  Lind Guest, MD  Colan Neptune, PA-C    Post-Anesthesia Vitals  ABP 92/46  Sp02 98  HR 80  RR 10      Post Anesthesia Evaluation Note    Evaluation location: ICU  Patient participation: patient intubated, unable to assess; expectation of recovery by ICU physician  Level of consciousness: intubated & sedated    Ventilator settings  Mode: VC  FIO2: 100  Tidal Volume: 440  Vent rate: 10  PEEP: 5  Pain management: adequate    Hydration: normovolemia  Temperature: 36.0C - 38.4C  Airway patency: adequate    Perioperative Events  Perioperative events:  no       Post-op nausea and vomiting: no PONV    Postoperative Status  Cardiovascular status: hemodynamically stable  Respiratory status: ETT and supplemental oxygen  ICU Information  VasoactiveDrips:norepinephrine infusion and vasopressin  Blood Products Given-yes          Cell Saver given (mL)-755  Cardiac ICU information    cardiac output assessed  cardiac index monitored    Staff involved in transport include: anesthesiologist, CRNA, SRNA, OR nurse and other      Perioperative Events  Perioperative Event: No  Emergency Case Activation: No

## 2017-02-04 NOTE — Anesthesia Procedure Notes
Anesthesia Procedure: Transesophageal Echocardiogram    TEE  Date/Time: 02/04/2017 1:00 PM    Associated procedure: CABG  Preprocedure checklist performed: 2 patient identifiers, risks & benefits discussed, patient evaluated, timeout performed, consent obtained and patient being monitored    Staff     Anesthesiologist: Orlan Leavens,      Surgeon: Farris Has, JEFFREY B     Performed personally    Indication for TEE: assessment of ascending aorta, assessment of surgical repair, defect repair evaluation, ventricular function, volume assessment, confirmation of pre-procedure diagnosis and valvular assessment    Physician requesting echo: KRAMER, JEFFREY B    CPT codes: 40981 - TEE 2D imaging (w or w/o M-mode) including probe placement, image acquisition, interpretation & report, 93321 - PWD and/or CWD f/u or limited study and 93325 - Color flow velocity mapping    Patient location: OR  Intubated: yes  Bite block: yes  Heart visualized: yes    Insertion: easy  Probe type: multiplane  Modalities: 2D, color flow mapping, continuous wave Doppler and pulse wave Doppler      Echocardiographic and Doppler Measurements    Ventricular Findings  Right Ventricle     RV cavity size: normal     RV hypertrophy: no     RV thrombus: no     RV global function: normal       LV hypertrophy: yes       LV wall thickness: 1.5 cm     LV thrombus: no     LV global function: normal     LV ejection fraction: 60%      Ventricular Wall Motion  Four Chamber View     Basal anterolateral: normal     Basal inferoseptal: normal     Mid anterolateral: normal     Mid inferoseptal: normal     Apical lateral: normal     Apical septal: normal      Two Chamber View     Basal anterior: normal     Basal inferior: normal     Mid anterior: normal     Mid inferior: normal     Apical anterior: normal     Apical inferior: normal    Long Axis View     Basal anteroseptal: normal     Basal inferolateral: normal     Mid anteroseptal: normal     Mid inferolateral: normal Apical lateral: normal     Apical septal: normal     Apex: normal      Mid Short Axis View     Mid anteroseptal: normal       Mid anterior: normal     Mid anterolateral: normal     Mid inferolateral: normal     Mid inferior: normal     Mid inferoseptal: normal        Valves  Aortic Valve     Annulus: normal     Stenosis: none     Peak gradient: 7 mmHg     Mean gradient: 4 mmHg     Regurgitation severity: none     Leaflet morphology: normal       Leaflet motion: normal    Mitral Valve     Annulus: normal     Stenosis: none     Regurgitation severity: mild     Leaflet morphology: normal     Leaflet motion: normal      Tricuspid Valve     Annulus: normal  Stenosis: none       Regurgitation severity: trace       Leaflet morphology: normal     Leaflet motion: normal    Pulmonic Valve     Annulus: normal       Stenosis: none       Regurgitation severity: mild and moderate       Leaflet morphology: normal    Aorta   Ascending Aorta      Size: normal    Sinotubular Junction     Size: normal      Sinus of Valsalva      Size: normal        Atria  Right Atrium      Size: dilated      Left Atrium      Size: dilated     Left atrial appendage size: normal      Septa  Intra-atrial septal morphology: normal    Intra-ventricular septal morphology: normal    Other Findings  Pericardium: normal    Pleural effusion: none    Pulmonary arteries: normal    Pulmonary venous flow: blunted (decreased) systolic flow          Post Procedure      Systolic anterior motion of the mitral valve: no  Return to CBT for echo-related diagnosis: no  Aorta intact after decannulation: yes  Post-procedure LVEF measured: yes; 65%  Post-procedure RV dysfunction: none    Post-procedure comments: S/p CABG, MAZE, and LAA ligation. LVEF stable or improved, aorta intact, LAA removed with only small basal remnant     Performed by: Orlan Leavens   Authorized by: Orlan Leavens,

## 2017-02-04 NOTE — Anesthesia Procedure Notes
Anesthesia Procedure: Arterial Line Placement    A-LINE INSERTION  Date/Time: 02/04/2017 12:33 PM    Patient location: OR  Indications: multiple ABGs, frequent labs and hemodynamic monitoring      Preprocedure checklist performed: 2 patient identifiers, risks & benefits discussed, patient evaluated, timeout performed, consent obtained and patient being monitored        Arterial Line Procedure   Patient sedated: yes (see MAR)  Sedation type: midazolam;   Artery prepped with chlorhexidine; skin prep agent completely dried prior to procedure.  Location: radial artery  Laterality: right  Technique: palpation and anatomical landmarks  Ultrasound image captured  Needle gauge: 20 G  Number of attempts: 3    Procedure Outcome  Catheter secured with adhesive dressing applied  Events: no complications noted during insertion and skin intact, warm, and dry    Observation: pt tolerated well        Performed by: Virgina Norfolk  Authorized by: Virgina Norfolk

## 2017-02-04 NOTE — Other
Brief Operative Note    Name: Kalicia Dufresne is a 61 y.o. female     DOB: 1955-07-01             MRN#: [id number]  DATE OF OPERATION: 02/04/2017    Date:  02/04/2017        Preoperative Dx:   Paroxysmal atrial fibrillation (HCC) [I48.0]    Post-op Diagnosis      * Paroxysmal atrial fibrillation (HCC) [I48.0]    Procedure(s):  CORONARY ARTERY BYPASS WITH ARTERIAL GRAFT - 4 GRAFTS (Internal Mammary Artery and Endovascular Vein Harvest)  MAZE PROCEDURE    Antibiotics:  Ancef 2g at 1316    Anesthesia Type: Defer to Anesthesia    Surgeon(s) and Role:     * Lind Guest, MD - Primary     * Colan Neptune, PA-C - Assisting     * Whitsitt, Lauren, PA-C - Assisting     * Christie Beckers, PA-C - Assisting    Findings:  CAD    Estimated Blood Loss: No blood loss documented.     Specimen(s) Removed/Disposition: * No specimens in log *    Complications:  None    Implants: None    Drains: Chest Tube(s): 95 mL    Disposition:  ICU - stable    Colan Neptune, PA-C  Pager [phone]

## 2017-02-04 NOTE — Anesthesia Procedure Notes
Anesthesia Procedure: Central Venous Catheter Line    CENTRAL LINE INSERTION  Date/Time: 02/04/2017 12:54 PM    Patient location: OR  Indications: medications requiring CV access and hemodynamic pressure monitoring    Preprocedure checklist performed: 2 patient identifiers, risks & benefits discussed, patient evaluated, timeout performed, consent obtained, patient being monitored and CVC bundle followed (proper hand washing, maximal sterile barrier technique with cap, sterile gown, sterile glove, sterile drape, and skin prep for antisepsis)        CVC Line Insertion Procedure  Skin prepped with chlorhexidine; skin prep agent completely dried prior to procedure.  Patient Position: Trendelenburg  Location: internal jugular vein  Laterality: right  Vein identification: ultrasound guided  Confirmation of venous placement prior to dilation of vein by: ultrasound and pressure transducer  Ultrasound image captured  Number of attempts: 1  Successful placement: yes  Patient sedated: yes    Sedation given: general    Catheter:     Catheter type: introducer placed using standard wire through needle technique     Catheter size: 9 Fr     Catheter insertion depth (cm): skin.    Procedure Outcome  Post procedure: all ports aspirated, dressing applied and line sutured; Dressing: chlorhexidine impregnated sponge and sterile occlusive dressing  Events: none  Observations: patient tolerated well    Additional notes: Sulfa-free      Performed by: Virgina Norfolk  Authorized by: Apolonio Schneiders, AMY

## 2017-02-04 NOTE — Anesthesia Procedure Notes
Anesthesia Procedure: Pulmonary Artery Catheter    PULMONARY ARTERY CATHETER INSERTION  Date/Time: 02/04/2017 12:55 PM  This note is used in conjunction with the CVC Line Insertion note for additional details regarding the insertion of a Pulmonary Artery Catheter:     PA Catheter Insertion Procedure   Catheter type: standard  Insertion depth: 43 cm  Events: none    Performed by: Virgina Norfolk  Authorized by: Apolonio Schneiders, AMY

## 2017-02-04 NOTE — Progress Notes
Critical Care Progress Note          Today's Date:  02/04/2017  Name:  Carletha Dawn                       MRN:  [id number]   Admission Date: 01/31/2017  LOS: 4 days                     Assessment/Plan:   Active Problems:    NSTEMI (non-ST elevated myocardial infarction) (HCC)    Type 2 diabetes mellitus with circulatory disorder, without long-term current use of insulin (HCC)    Atrial fibrillation (HCC)    Atrial fibrillation with rapid ventricular response (HCC)    Chronic gastrointestinal bleeding    Transfusion-dependent anemia    I have seen, personally fully evaluated, and discussed patient with the CICU team. 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  I spent 47 minutes (excluding time spent performing or supervising any procedures) providing and personally directing critical care services including ventilator management, pain mgt, hemodynamic monitoring and management, lab and radiology review, medication review and management, fluid and electrolyte management and coordination of care.     Charlies Silvers, MD, MS, CCC/SLP 02/04/2017 9:41 AM  Clinical Instructor, Anesthesiology and Critical Care  [phone]    1. Neuro: APAP PRN pain  2. CV: 3 vessel disease seen on OSH LHC after presenting with NSTEMI. CTS consult, appreciate recs, plan for CABG on Tuesday. Amiodarone started for afib w/RVR - change to PO.  BP normalized, restarted PTA toprol and ACEI.  Continue statin, ASA  9/4 - CTS plans CABG this date  3. Pulm: on 2 L NC, wean as tolerated.  Encourage IS. PFTs fev1/fvc = 1.35/1.70  9/4: no acute issues o/n  4. GI: GI consult for acute on chronic blood loss anemia.  Appreciate recs.  Performing capsule endoscopy today, future interventions TBD. PPI Bid. Renal: Cr 1.75--> 1.20--> 1.05, unclear baseline.  Avoid nephrotoxins as able.  Likely has UTI based on fevers and U/A, continue abx. Dc lasix. Hold spironolactone until postop..  9/4: Ammonia 70, Hep B+. 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 IV iron started.  6. Endo: PTA synthroid 175 mcg. Hx DMII, check HbA1c, ensure BG<200.  Holding PTA oral hypoglycemics, insulin regimen started.  9/4: increased insulin regimen  7. Heme: acute on chronic blood loss anemia for which she had had multiple extensive workups that have demonstrated chronic gastritis and 2 tubular adenomas.  Receiving iron infusions and intermittent transfusions as an outpatient, and now has antibodies.  Goal Hb > 8 in setting of symptomatic anemia and NSTEMI, maintain active T&C. 1u PRBC 02/03/17 for Hgb 7.8. On heparin gtt for ACS and afib indications with low PTT goal (50-70).  Has lower extremity superficial DVTs. Plt count slowly decreasing - will send HIT Ab and continue heparin for now. Consider heme/onc c/s if HIT+  9/4: capsule endoscopy with telangectasias, no active bleeding. Plan for CABG today, has appropriately T&C blood. HIT pending. iu PRBC 9/3.  8. ID: Has ? Cellulitis vs thrombophlebitis of R forearm, started Keflex with E Coli on UCx.  RUE doppler - with dvt in r cephalic vein.   9/4: Keflex day 2/5  9. PPx: SCDs, PPI BID    Dispo: needs ICU    __________________________________________________________________________________  Subjective:  Diane Savage is a 61 y.o. female w/ PMH  of chronic gastritis, DMII, afib, and transfusion-dependent anemia who presented with chest pain to an OSH.  LHC there demonstrated triple vessel disease in addition to Hb of 6.1, for which she received 2 units pRBCS.  She was transferred to [facility] for further evaluation.    Overnight events: NAEON, cellulitis improved.     Objective:  Medications:  Scheduled Meds:    acetaminophen (TYLENOL) tablet 650 mg 650 mg Oral ONCE   ALBUMIN, HUMAN 5 % IV SOLP (Cabinet Override)   NOW   amiodarone (CORDARONE) tablet 200 mg 200 mg Oral BID   aspirin chewable tablet 81 mg 81 mg Oral QDAY cephalexin (KEFLEX) capsule 500 mg 500 mg Oral QID   diphenhydrAMINE (BENADRYL) capsule 25 mg 25 mg Oral ONCE   insulin aspart U-100 (NOVOLOG FLEXPEN) injection PEN 0-7 Units 0-7 Units Subcutaneous ACHS   insulin glargine (LANTUS SOLOSTAR, BASAGLAR) injection PEN 5 Units 5 Units Subcutaneous QHS(22)   iron sucrose (VENOFER) injection 200 mg 200 mg Intravenous ONCE   lactulose oral solution 10 g 15 mL Oral TID   levothyroxine (SYNTHROID) tablet 175 mcg 175 mcg Oral QDAY(07)   MAGNESIUM SULFATE IN WATER 4 GRAM/50 ML (8 %) IV PGBK (Cabinet Override)   NOW   metoprolol XL (TOPROL XL) tablet 50 mg 50 mg Oral QDAY   pantoprazole (PROTONIX) injection 80 mg 80 mg Intravenous BID(11-21)   pneumococcal 23-val vaccine (PPSV23) (PNEUMOVAX 23) injection 0.5 mL 0.5 mL Intramuscular ONCE   POTASSIUM CHLORIDE IN WATER 10 MEQ/50 ML IV PGBK (Cabinet Override)   NOW   rosuvastatin (CRESTOR) tablet 40 mg 40 mg Oral QDAY   Continuous Infusions:  ??? heparin (porcine) 20,000 Units in dextrose 5% (D5W) 250 mL IV infusion (dbl conc) 2,100 Units/hr (02/04/17 0728)   ??? nitroGLYCERIN 50 mg/D5W 250 mL infusion Stopped (01/31/17 2105)   ??? sodium chloride 0.9 %   infusion       PRN and Respiratory Meds:acetaminophen Q4H PRN, EPINEPHrine PF Q10 MIN PRN, ondansetron Q6H PRN                     Vital Signs: Last Filed                  Vital Signs: 24 Hour Range   BP: 159/47 (09/04 0900)  Temp: 36.6 ???C (97.8 ???F) (09/04 0400)  Pulse: 71 (09/04 0900)  Respirations: 20 PER MINUTE (09/04 0900)  SpO2: 95 % (09/04 0900)  O2 Delivery: None (Room Air) (09/04 0900)  Weight: 113.5 kg (250 lb 3.2 oz) (09/04 0400)  BP: (126-160)/(35-66)   Temp:  [36.6 ???C (97.8 ???F)-37.3 ???C (99.1 ???F)]   Pulse:  [70-84]   Respirations:  [14 PER MINUTE-26 PER MINUTE]   SpO2:  [74 %-99 %]   O2 Delivery: None (Room Air)    Intensity Pain Scale 0-10 (Pain 1): (not recorded) Vitals:    02/02/17 0400 02/03/17 0630 02/04/17 0400 Weight: 115 kg (253 lb 8.5 oz) 113 kg (249 lb 1.9 oz) 113.5 kg (250 lb 3.2 oz)       Critical Care Vitals:      ICP Monitoring:     PA  Catheter:     Hemodynamics/Oxycalcs:       Intake/Output Summary:  (Last 24 hours)    Intake/Output Summary (Last 24 hours) at 02/04/17 0941  Last data filed at 02/04/17 0900   Gross per 24 hour   Intake          1099.84 ml  Output              850 ml   Net           249.84 ml         Physical Exam:   General: obese female in NAD  Neck: no JVD  CV: irregular rate and rhythm  Chest: lungs CTAB  Abdomen: NTND, +NABS  Extremities: WWP    Artificial airway:  None                                                                                       Ventilator/ Respiratory Therapy:  No   Vent weaning trial:  Not applicable      Prophylaxis Review:  Lines:  No  Urinary Catheter:  No  Antibiotic Usage:  Yes; Infection present or suspected:  GU/GI;  Urinary tract infection (UTI)  VTE:  Mechanical prophylaxis; Sequential compression device    Lab Review:  Pertinent labs reviewed  Point of Care Testing:  (Last 24 hours):  Glucose: (!) 221 (02/04/17 0450)  POC Glucose (Download): (!) 211 (02/04/17 1610)    Radiology and Other Diagnostic Procedures Review:    Pertinent radiology reviewed.

## 2017-02-05 DIAGNOSIS — I214 Non-ST elevation (NSTEMI) myocardial infarction: Secondary | ICD-10-CM

## 2017-02-05 LAB — BLOOD GASES, ARTERIAL
Lab: 42 mmHg (ref 35–45)
Lab: 7.3 (ref 7.35–7.45)
Lab: 91 mmHg (ref 80–100)
Lab: 97 % (ref 95–99)
Lab: 149 mmHg — ABNORMAL HIGH (ref 80–100)
Lab: 22 MMOL/L (ref 21–28)
Lab: 3 MMOL/L
Lab: 49 mmHg — ABNORMAL HIGH (ref 35–45)
Lab: 7.3 — ABNORMAL LOW (ref 7.35–7.45)
Lab: 98 % (ref 95–99)
Lab: 20 MMOL/L — ABNORMAL LOW (ref 21–28)
Lab: 4.7 MMOL/L
Lab: 47 mmHg — ABNORMAL HIGH (ref 35–45)
Lab: 97 % (ref 95–99)
Lab: 98 mmHg (ref 80–100)
Lab: 7.2 — ABNORMAL LOW (ref 7.35–7.45)
Lab: 21 MMOL/L (ref 21–28)
Lab: 50 mmHg — ABNORMAL HIGH (ref 35–45)
Lab: 7.2 mmHg — ABNORMAL LOW (ref 7.35–7.45)
Lab: 96 % (ref 95–99)
Lab: 21 MMOL/L (ref 21–28)
Lab: 98 % (ref 95–99)
Lab: 7.3 — ABNORMAL LOW (ref 7.35–7.45)
Lab: 7.3 10*3/uL — ABNORMAL LOW (ref 7.35–7.45)

## 2017-02-05 LAB — POC GLUCOSE
Lab: 110 mg/dL — ABNORMAL HIGH (ref 70–100)
Lab: 114 mg/dL — ABNORMAL HIGH (ref 70–100)
Lab: 115 mg/dL — ABNORMAL HIGH (ref 70–100)
Lab: 117 mg/dL — ABNORMAL HIGH (ref 70–100)
Lab: 123 mg/dL — ABNORMAL HIGH (ref 70–100)
Lab: 129 mg/dL — ABNORMAL HIGH (ref 70–100)
Lab: 134 mg/dL — ABNORMAL HIGH (ref 70–100)
Lab: 137 mg/dL — ABNORMAL HIGH (ref 70–100)
Lab: 141 mg/dL — ABNORMAL HIGH (ref 70–100)
Lab: 147 mg/dL — ABNORMAL HIGH (ref 70–100)
Lab: 159 mg/dL — ABNORMAL HIGH (ref 70–100)
Lab: 165 mg/dL — ABNORMAL HIGH (ref 70–100)
Lab: 166 mg/dL — ABNORMAL HIGH (ref 70–100)
Lab: 172 mg/dL — ABNORMAL HIGH (ref 70–100)
Lab: 177 mg/dL — ABNORMAL HIGH (ref 70–100)
Lab: 183 mg/dL — ABNORMAL HIGH (ref 70–100)
Lab: 183 mg/dL — ABNORMAL HIGH (ref 70–100)

## 2017-02-05 LAB — BASIC METABOLIC PANEL
Lab: 0.9 mg/dL (ref 0.4–1.00)
Lab: 107 MMOL/L (ref 98–110)
Lab: 11 mg/dL (ref 7–25)
Lab: 137 MMOL/L (ref 137–147)
Lab: 154 mg/dL — ABNORMAL HIGH (ref 70–100)
Lab: 4 (ref 3–12)
Lab: 59 mL/min — ABNORMAL LOW (ref >60)
Lab: 8.2 mg/dL — ABNORMAL LOW (ref 8.5–10.6)
Lab: >60 mL/min (ref >60)
Lab: 1.3 mg/dL — ABNORMAL HIGH (ref 0.4–1.00)
Lab: 104 MMOL/L (ref 98–110)
Lab: 127 mg/dL — ABNORMAL HIGH (ref 70–100)
Lab: 133 MMOL/L — ABNORMAL LOW (ref 137–147)
Lab: 17 mg/dL (ref 7–25)
Lab: 23 MMOL/L (ref 21–30)
Lab: 4.5 MMOL/L (ref 3.5–5.1)
Lab: 41 mL/min — ABNORMAL LOW (ref >60)
Lab: 49 mL/min — ABNORMAL LOW (ref >60)
Lab: 6 (ref 3–12)
Lab: 8.3 mg/dL — ABNORMAL LOW (ref 8.5–10.6)
Lab: 1.3 mg/dL — ABNORMAL HIGH (ref 0.4–1.00)
Lab: 103 MMOL/L (ref 98–110)
Lab: 128 mg/dL — ABNORMAL HIGH (ref 70–100)
Lab: 133 MMOL/L — ABNORMAL LOW (ref 137–147)
Lab: 23 MMOL/L — ABNORMAL HIGH (ref 21–30)
Lab: 4.5 MMOL/L — ABNORMAL HIGH (ref 3.5–5.1)
Lab: 40 mL/min — ABNORMAL LOW (ref >60)
Lab: 48 mL/min — ABNORMAL LOW (ref >60)
Lab: 7 MMOL/L (ref 3–12)
Lab: 8.3 mg/dL — ABNORMAL LOW (ref 8.5–10.6)

## 2017-02-05 LAB — MAGNESIUM
Lab: 2.8 mg/dL — ABNORMAL HIGH (ref 1.6–2.6)
Lab: 3.5 mg/dL — ABNORMAL HIGH (ref 1.6–2.6)
Lab: 3.2 mg/dL — ABNORMAL HIGH (ref >60)

## 2017-02-05 LAB — POC BLOOD GAS ARTERIAL
Lab: 23 MMOL/L — ABNORMAL LOW (ref 21–28)
Lab: 3 MMOL/L — ABNORMAL LOW (ref 21–28)
Lab: 44 mmHg (ref 35–45)
Lab: 7.3 mmHg — ABNORMAL LOW (ref 7.35–7.45)
Lab: 91 mmHg — ABNORMAL HIGH (ref 80–100)
Lab: 96 % — ABNORMAL LOW (ref 95–99)

## 2017-02-05 LAB — PROTIME INR (PT): Lab: 1.3 MMOL/L — ABNORMAL HIGH (ref 0.8–1.2)

## 2017-02-05 LAB — LIPID PROFILE: Lab: 93 mg/dL — ABNORMAL HIGH (ref <200)

## 2017-02-05 LAB — PTT (APTT): Lab: 26 s (ref 20.0–36.0)

## 2017-02-05 LAB — COMPREHENSIVE METABOLIC PANEL: Lab: 133 MMOL/L — ABNORMAL LOW (ref >40)

## 2017-02-05 LAB — ANTIBODY CONSULT

## 2017-02-05 LAB — AMMONIA: Lab: 74 umol/L — ABNORMAL HIGH (ref 9–35)

## 2017-02-05 LAB — IONIZED CALCIUM: Lab: 1.1 MMOL/L (ref 1.0–1.3)

## 2017-02-05 LAB — CBC: Lab: 10 K/UL — ABNORMAL HIGH (ref >60)

## 2017-02-05 MED ORDER — NOREPINEPHRINE IV DRIP (STD CONC)
.01-.4 ug/kg/min | INTRAVENOUS | 0 refills | Status: DC
Start: 2017-02-05 — End: 2017-02-08
  Administered 2017-02-06 (×2): 0.04 ug/kg/min via INTRAVENOUS

## 2017-02-05 MED ORDER — ALBUMIN, HUMAN 5 % IV SOLP
250 mL | Freq: Once | INTRAVENOUS | 0 refills | Status: DC
Start: 2017-02-05 — End: 2017-02-05

## 2017-02-05 MED ORDER — ACETAMINOPHEN 1,000 MG/100 ML (10 MG/ML) IV SOLN
1000 mg | Freq: Once | INTRAVENOUS | 0 refills | Status: CP
Start: 2017-02-05 — End: ?
  Administered 2017-02-05: 17:00:00 1000 mg via INTRAVENOUS

## 2017-02-05 MED ORDER — METOCLOPRAMIDE HCL 5 MG/ML IJ SOLN
10 mg | INTRAVENOUS | 0 refills | Status: CP
Start: 2017-02-05 — End: ?
  Administered 2017-02-05 – 2017-02-06 (×3): 10 mg via INTRAVENOUS

## 2017-02-05 MED ORDER — ONDANSETRON HCL (PF) 4 MG/2 ML IJ SOLN
4-8 mg | INTRAVENOUS | 0 refills | Status: DC | PRN
Start: 2017-02-05 — End: 2017-02-16
  Administered 2017-02-06 – 2017-02-15 (×14): 4 mg via INTRAVENOUS
  Administered 2017-02-15: 17:00:00 8 mg via INTRAVENOUS

## 2017-02-05 MED ORDER — POLYETHYLENE GLYCOL 3350 17 GRAM PO PWPK
1 | Freq: Every day | ORAL | 0 refills | Status: DC
Start: 2017-02-05 — End: 2017-02-16
  Administered 2017-02-05 – 2017-02-07 (×2): 17 g via ORAL

## 2017-02-05 MED ORDER — HYDROMORPHONE IN 0.9 % NACL 11 MG/55 ML IJ SPCA (COPY)
INTRAVENOUS | 0 refills | Status: DC
Start: 2017-02-05 — End: 2017-02-06
  Administered 2017-02-06: 02:00:00 55.000 mL via INTRAVENOUS

## 2017-02-05 MED ORDER — HYDROMORPHONE (PF) 2 MG/ML IJ SYRG
.5 mg | INTRAVENOUS | 0 refills | Status: DC | PRN
Start: 2017-02-05 — End: 2017-02-06
  Administered 2017-02-05 – 2017-02-06 (×2): 0.5 mg via INTRAVENOUS

## 2017-02-05 MED ORDER — PROCHLORPERAZINE 25 MG RE SUPP
25 mg | Freq: Once | RECTAL | 0 refills | Status: CP
Start: 2017-02-05 — End: ?
  Administered 2017-02-06: 02:00:00 25 mg via RECTAL

## 2017-02-05 MED ORDER — FENTANYL CITRATE (PF) 50 MCG/ML IJ SOLN
100 ug | Freq: Once | INTRAVENOUS | 0 refills | Status: CP
Start: 2017-02-05 — End: ?
  Administered 2017-02-05: 11:00:00 100 ug via INTRAVENOUS

## 2017-02-05 MED ORDER — ONDANSETRON HCL (PF) 4 MG/2 ML IJ SOLN
4 mg | Freq: Once | INTRAVENOUS | 0 refills | Status: CP
Start: 2017-02-05 — End: ?
  Administered 2017-02-05: 12:00:00 4 mg via INTRAVENOUS

## 2017-02-05 MED ORDER — NALOXONE 0.4 MG/ML IJ SOLN
.08 mg | INTRAVENOUS | 0 refills | Status: DC | PRN
Start: 2017-02-05 — End: 2017-02-11

## 2017-02-05 MED ORDER — LIDOCAINE 5 % TP PTMD
1-2 | Freq: Every day | TOPICAL | 0 refills | Status: DC
Start: 2017-02-05 — End: 2017-02-16
  Administered 2017-02-05 – 2017-02-12 (×8): 1 via TOPICAL

## 2017-02-05 MED ORDER — HALOPERIDOL LACTATE 5 MG/ML IJ SOLN
1 mg | Freq: Once | INTRAVENOUS | 0 refills | Status: DC
Start: 2017-02-05 — End: 2017-02-05

## 2017-02-05 MED ORDER — PROMETHAZINE 25 MG/ML IJ SOLN
6.25 mg | Freq: Once | INTRAVENOUS | 0 refills | Status: CP
Start: 2017-02-05 — End: ?
  Administered 2017-02-05: 18:00:00 6.25 mg via INTRAVENOUS

## 2017-02-05 MED ORDER — HYDROMORPHONE (PF) 2 MG/ML IJ SYRG
.5-1 mg | Freq: Once | INTRAVENOUS | 0 refills | Status: CP
Start: 2017-02-05 — End: ?
  Administered 2017-02-06: 01:00:00 1 mg via INTRAVENOUS

## 2017-02-05 MED ORDER — ONDANSETRON HCL 4 MG PO TAB
4 mg | ORAL | 0 refills | Status: DC | PRN
Start: 2017-02-05 — End: 2017-02-16

## 2017-02-05 MED ORDER — ALBUTEROL SULFATE 2.5 MG/0.5 ML IN NEBU
2.5 mg | RESPIRATORY_TRACT | 0 refills | Status: DC | PRN
Start: 2017-02-05 — End: 2017-02-16
  Administered 2017-02-06 (×2): 2.5 mg via RESPIRATORY_TRACT

## 2017-02-05 MED ORDER — ALBUMIN, HUMAN 25 % IV SOLP
25 g | INTRAVENOUS | 0 refills | Status: CP
Start: 2017-02-05 — End: ?
  Administered 2017-02-05 – 2017-02-06 (×4): 25 g via INTRAVENOUS

## 2017-02-05 MED ADMIN — LIDOCAINE (PF) 10 MG/ML (1 %) IJ SOLN [95838]: INTRADERMAL | @ 21:00:00 | Stop: 2017-02-05 | NDC 55150016102

## 2017-02-05 NOTE — Progress Notes
I have reviewed all of [name] charting and I agree.

## 2017-02-05 NOTE — Progress Notes
General Progress Note    Name:  Diane Savage   Today's Date:  02/05/2017  Admission Date: 01/31/2017  LOS: 5 days                     Assessment/Plan:    Active Problems:    NSTEMI (non-ST elevated myocardial infarction) (HCC)    Type 2 diabetes mellitus with circulatory disorder, without long-term current use of insulin (HCC)    PAF (paroxysmal atrial fibrillation) (HCC)    Atrial fibrillation with rapid ventricular response (HCC)    Chronic gastrointestinal bleeding    Transfusion-dependent anemia    CAD (coronary artery disease)    HLD (hyperlipidemia)    Hypothyroidism    Hepatitis B    Vasogenic shock (HCC)    Hyperammonemia (HCC)    Obstructive pattern present on pulmonary function testing    Respiratory acidosis    Acute blood loss anemia    Thrombocytopenia (HCC)    Junctional rhythm    61 year old lady with type 2 diabetes, chronic blood loss anemia with possible occult GI bleed, atrial fibrillation not on anticoagulation and others who was admitted with anemia, fevers and chest pain.  She was found to have non-ST elevation MI.  Angiogram revealed coronary occlusion then need CABG.  Hematology team was consulted to evaluate for anemia.  ???  Patient has chronic normocytic normochromic anemia and was transfusion dependent, etiology could be secondary to occult GI bleed. Plans for outpatient followup.   She received IV iron several days ago.  Her iron studies showed mixed picture which could be due to the fact that she recently received IV iron. Her B12, folate, copper, transferrin receptor and lead levels are normal. No signs of hemolysis with normal LDH, TB and haptoglobin.   ???  ???  Recommendations:  ??? Transfuse packed red blood cells to keep hemoglobin of 8.0.  ??? GI followup outpatient  ??? Have ordered hematology apt at [facility]      Pt discussed with Dr.Vallurupalli. Will sign off, please call with questions.   ________________________________________________________________________    Subjective Diane Savage is a 61 y.o. female.  Patient is recovering from surgery, tired and in pain. Denies signs of bleeding, no fevers/chills.     Medications  Scheduled Meds:  acetaminophen (TYLENOL) tablet 1,000 mg 1,000 mg Oral Q6H*   aspirin chewable tablet 81 mg 81 mg Oral QDAY   cefTRIAXone (ROCEPHIN) IVP 1 g 1 g Intravenous Q24H*   levothyroxine (SYNTHROID) tablet 175 mcg 175 mcg Oral QDAY(07)   lidocaine (LIDODERM) 5 % topical patch 1-2 patch 1-2 patch Topical QDAY   metoclopramide (REGLAN) injection 10 mg 10 mg Intravenous Q6H*   pantoprazole (PROTONIX) injection 80 mg 80 mg Intravenous BID(11-21)   polyethylene glycol 3350 (MIRALAX) packet 17 g 1 packet Oral QDAY   rosuvastatin (CRESTOR) tablet 40 mg 40 mg Oral QDAY   senna/docusate (SENOKOT-S) tablet 2 tablet 2 tablet Oral BID   Continuous Infusions:  ??? insulin regular (NOVOLIN R) 100 Units in sodium chloride 0.9% (NS) 100 mL IV drip (std conc) 4 Units/hr (02/05/17 1410)   ??? nitroGLYCERIN 50 mg/D5W 250 mL infusion     ??? norepinephrine (LEVOPHED) 4 mg in sodium chloride 0.9% (NS) 250 mL IV drip (std conc) 0.04 mcg/kg/min (02/05/17 1503)   ??? sodium chloride 0.9 %   infusion 30 mL/hr at 02/05/17 0646   ??? vasopressin (VASOSTRICT) 20 Units in sodium chloride 0.9% (NS) 100 mL IV drip (std conc) 3 Units/hr (02/05/17  1503)     PRN and Respiratory Meds:[START ON 02/09/2017] acetaminophen Q6H PRN **OR** [START ON 02/09/2017] acetaminophen Q6H PRN, alum/mag hydroxide/simeth Q4H PRN, bisacodyl QDAY PRN, fentaNYL citrate PF Q1H PRN, hydrALAZINE Q6H PRN, HYDROmorphone (DILAUDID) injection Q30 MIN PRN, lidocaine PF PRN, milk of magnesia (CONC) QDAY PRN, ondansetron Q6H PRN **OR** ondansetron (ZOFRAN) IV Q6H PRN, oxyCODONE Q4H PRN, potassium chloride SR PRN **OR** potassium chloride PRN **OR** potassium chloride in water PRN      Review of Systems:  A 14 point review of systems was negative except for: as per subjective    Objective: Vital Signs: Last Filed                 Vital Signs: 24 Hour Range   Temp: 36.4 ???C (97.5 ???F) (09/05 1400)  Pulse: 66 (09/05 1600)  Respirations: 67 PER MINUTE (09/05 1600)  SpO2: 100 % (09/05 1600)  O2 Delivery: Non Invasive Ventilation (CPAP) (09/05 1600)  SpO2 Pulse: 66 (09/05 1600) ABP: (85-144)/(41-131)   Temp:  [34.9 ???C (94.8 ???F)-36.8 ???C (98.2 ???F)]   Pulse:  [58-87]   Respirations:  [10 PER MINUTE-67 PER MINUTE]   SpO2:  [93 %-100 %]   O2 Delivery: Non Invasive Ventilation (CPAP)   Intensity Pain Scale 0-10 (Pain 1): 8 (02/05/17 0600) Vitals:    02/04/17 0400 02/04/17 1130 02/05/17 0600   Weight: 113.5 kg (250 lb 3.2 oz) 113.5 kg (250 lb 3.2 oz) 117.3 kg (258 lb 9.6 oz)       Intake/Output Summary:  (Last 24 hours)    Intake/Output Summary (Last 24 hours) at 02/05/17 1627  Last data filed at 02/05/17 1600   Gross per 24 hour   Intake           3174.6 ml   Output             1605 ml   Net           1569.6 ml      Stool Occurrence: 1    Physical Exam  General: A&O, no apparent distress  Head: Normocephalic, without obvious abnormality, atraumatic   Eyes: PERRL, anicteric sclera, conjunctiva and lids normal  Nose: Nares normal. Septum midline. Mucosa normal. No drainageor sinus tenderness   Respiratory: no tachypnea, Bipap  Cardiovascular: CTs with sanginous output. Regular rate, No murmurs, rubs, clicks or gallops  Psychosocial: Appropriate        Lab Review  24-hour labs:    Results for orders placed or performed during the hospital encounter of 01/31/17 (from the past 24 hour(s))   POC GLUCOSE    Collection Time: 02/04/17  4:44 PM   Result Value Ref Range    Glucose, POC 209 (H) 70 - 100 MG/DL   POC BLOOD GAS ARTERIAL    Collection Time: 02/04/17  4:47 PM   Result Value Ref Range    PH-ART-POC 7.46 (H) 7.35 - 7.45    PCO2-ART-POC 40 35 - 45 MMHG    PO2-ART-POC 464 (H) 80 - 100 MMHG    Base Ex-ART-POC 5.0 MMOL/L    O2 Sat-ART-POC 100.0 (H) 95 - 99 % Bicarbonate-ART-POC 28.3 (H) 21 - 28 MMOL/L   POC HEMATOCRIT    Collection Time: 02/04/17  4:47 PM   Result Value Ref Range    Hemoglobin POC 8.8 (L) 12.0 - 15.0 GM/DL    Hematocrit POC 16.1 (L) 36 - 45 %   POC POTASSIUM    Collection Time: 02/04/17  4:47 PM  Result Value Ref Range    Potassium-POC 4.6 3.5 - 5.1 MMOL/L   POC SODIUM    Collection Time: 02/04/17  4:47 PM   Result Value Ref Range    Sodium-POC 137 137 - 147 MMOL/L   POC IONIZED CALCIUM    Collection Time: 02/04/17  4:47 PM   Result Value Ref Range    Ionized Calcium-POC 1.01 1.0 - 1.3 MMOL/L   POC GLUCOSE    Collection Time: 02/04/17  5:36 PM   Result Value Ref Range    Glucose, POC 177 (H) 70 - 100 MG/DL   POC BLOOD GAS ARTERIAL    Collection Time: 02/04/17  5:39 PM   Result Value Ref Range    PH-ART-POC 7.40 7.35 - 7.45    PCO2-ART-POC 40 35 - 45 MMHG    PO2-ART-POC 147 (H) 80 - 100 MMHG    Base Ex-ART-POC 0.0 MMOL/L    O2 Sat-ART-POC 99.0 95 - 99 %    Bicarbonate-ART-POC 24.8 21 - 28 MMOL/L   POC HEMATOCRIT    Collection Time: 02/04/17  5:39 PM   Result Value Ref Range    Hemoglobin POC 8.8 (L) 12.0 - 15.0 GM/DL    Hematocrit POC 16.1 (L) 36 - 45 %   POC POTASSIUM    Collection Time: 02/04/17  5:39 PM   Result Value Ref Range    Potassium-POC 4.2 3.5 - 5.1 MMOL/L   POC SODIUM    Collection Time: 02/04/17  5:39 PM   Result Value Ref Range    Sodium-POC 139 137 - 147 MMOL/L   POC IONIZED CALCIUM    Collection Time: 02/04/17  5:39 PM   Result Value Ref Range    Ionized Calcium-POC 1.13 1.0 - 1.3 MMOL/L   POC GLUCOSE    Collection Time: 02/04/17  6:44 PM   Result Value Ref Range    Glucose, POC 150 (H) 70 - 100 MG/DL   CBC    Collection Time: 02/04/17  6:45 PM   Result Value Ref Range    White Blood Cells 14.1 (H) 4.5 - 11.0 K/UL    RBC 3.48 (L) 4.0 - 5.0 M/UL    Hemoglobin 10.6 (L) 12.0 - 15.0 GM/DL    Hematocrit 09.6 (L) 36 - 45 %    MCV 92.3 80 - 100 FL    MCH 30.4 26 - 34 PG    MCHC 32.9 32.0 - 36.0 G/DL    RDW 04.5 (H) 11 - 15 % Platelet Count 131 (L) 150 - 400 K/UL    MPV 7.8 7 - 11 FL   PTT (APTT)    Collection Time: 02/04/17  6:45 PM   Result Value Ref Range    APTT 26.8 20.0 - 36.0 SEC   BASIC METABOLIC PANEL    Collection Time: 02/04/17  6:45 PM   Result Value Ref Range    Sodium 137 137 - 147 MMOL/L    Potassium 4.3 3.5 - 5.1 MMOL/L    Chloride 107 98 - 110 MMOL/L    CO2 26 21 - 30 MMOL/L    Anion Gap 4 3 - 12    Glucose 154 (H) 70 - 100 MG/DL    Blood Urea Nitrogen 11 7 - 25 MG/DL    Creatinine 4.09 0.4 - 1.00 MG/DL    Calcium 8.2 (L) 8.5 - 10.6 MG/DL    eGFR Non African American 59 (L) >60 mL/min    eGFR African American >60 >60 mL/min  PROTIME INR (PT)    Collection Time: 02/04/17  6:45 PM   Result Value Ref Range    INR 1.3 (H) 0.8 - 1.2   MAGNESIUM    Collection Time: 02/04/17  6:45 PM   Result Value Ref Range    Magnesium 2.8 (H) 1.6 - 2.6 mg/dL   POC BLOOD GAS ARTERIAL    Collection Time: 02/04/17  6:51 PM   Result Value Ref Range    PH-ART-POC 7.32 (L) 7.35 - 7.45    PCO2-ART-POC 50 (H) 35 - 45 MMHG    PO2-ART-POC 186 (H) 80 - 100 MMHG    Base Def-ART-POC 1.0 MMOL/L    O2 Sat-ART-POC 100.0 (H) 95 - 99 %    Bicarbonate-ART-POC 25.6 21 - 28 MMOL/L   POC HEMATOCRIT    Collection Time: 02/04/17  6:51 PM   Result Value Ref Range    Hemoglobin POC 10.5 (L) 12.0 - 15.0 GM/DL    Hematocrit POC 16.1 (L) 36 - 45 %   POC POTASSIUM    Collection Time: 02/04/17  6:51 PM   Result Value Ref Range    Potassium-POC 4.1 3.5 - 5.1 MMOL/L   POC SODIUM    Collection Time: 02/04/17  6:51 PM   Result Value Ref Range    Sodium-POC 138 137 - 147 MMOL/L   POC IONIZED CALCIUM    Collection Time: 02/04/17  6:51 PM   Result Value Ref Range    Ionized Calcium-POC 1.20 1.0 - 1.3 MMOL/L   POC GLUCOSE    Collection Time: 02/04/17  7:06 PM   Result Value Ref Range    Glucose, POC 141 (H) 70 - 100 MG/DL   BLOOD GASES, ARTERIAL    Collection Time: 02/04/17  9:05 PM   Result Value Ref Range    pH-Arterial 7.38 7.35 - 7.45    pCO2-Arterial 42 35 - 45 MMHG pO2-Arterial 91 80 - 100 MMHG    Base Deficit-Arterial 0.5 MMOL/L    O2 Sat-Arterial 97.7 95 - 99 %    Bicarbonate-ART-Cal 24.0 21 - 28 MMOL/L   POC GLUCOSE    Collection Time: 02/04/17  9:05 PM   Result Value Ref Range    Glucose, POC 114 (H) 70 - 100 MG/DL   MAGNESIUM    Collection Time: 02/04/17 10:00 PM   Result Value Ref Range    Magnesium 3.5 (H) 1.6 - 2.6 mg/dL   POC GLUCOSE    Collection Time: 02/04/17 10:02 PM   Result Value Ref Range    Glucose, POC 134 (H) 70 - 100 MG/DL   POC GLUCOSE    Collection Time: 02/04/17 11:02 PM   Result Value Ref Range    Glucose, POC 166 (H) 70 - 100 MG/DL   POC BLOOD GAS ARTERIAL    Collection Time: 02/04/17 11:18 PM   Result Value Ref Range    PH-ART-POC 7.33 (L) 7.35 - 7.45    PCO2-ART-POC 44 35 - 45 MMHG    PO2-ART-POC 91 80 - 100 MMHG    Base Def-ART-POC 3.0 MMOL/L    O2 Sat-ART-POC 96.0 95 - 99 %    Bicarbonate-ART-POC 23.1 21 - 28 MMOL/L   BLOOD GASES, ARTERIAL    Collection Time: 02/05/17 12:10 AM   Result Value Ref Range    pH-Arterial 7.30 (L) 7.35 - 7.45    pCO2-Arterial 49 (H) 35 - 45 MMHG    pO2-Arterial 149 (H) 80 - 100 MMHG    Base Deficit-Arterial 3.0  MMOL/L    O2 Sat-Arterial 98.9 95 - 99 %    Bicarbonate-ART-Cal 22.0 21 - 28 MMOL/L   POC GLUCOSE    Collection Time: 02/05/17 12:11 AM   Result Value Ref Range    Glucose, POC 183 (H) 70 - 100 MG/DL   POC GLUCOSE    Collection Time: 02/05/17  1:02 AM   Result Value Ref Range    Glucose, POC 159 (H) 70 - 100 MG/DL   POC GLUCOSE    Collection Time: 02/05/17  2:02 AM   Result Value Ref Range    Glucose, POC 172 (H) 70 - 100 MG/DL   POC GLUCOSE    Collection Time: 02/05/17  2:56 AM   Result Value Ref Range    Glucose, POC 177 (H) 70 - 100 MG/DL   POC GLUCOSE    Collection Time: 02/05/17  4:14 AM   Result Value Ref Range    Glucose, POC 183 (H) 70 - 100 MG/DL   LIPID PROFILE    Collection Time: 02/05/17  4:15 AM   Result Value Ref Range    Cholesterol 93 <200 MG/DL    Triglycerides 91 <161 MG/DL HDL 33 (L) >09 MG/DL    LDL 46 <604 MG/DL    VLDL 18 MG/DL    Non HDL Cholesterol 60 MG/DL   COMPREHENSIVE METABOLIC PANEL    Collection Time: 02/05/17  4:15 AM   Result Value Ref Range    Sodium 133 (L) 137 - 147 MMOL/L    Potassium 4.9 3.5 - 5.1 MMOL/L    Chloride 105 98 - 110 MMOL/L    Glucose 194 (H) 70 - 100 MG/DL    Blood Urea Nitrogen 15 7 - 25 MG/DL    Creatinine 5.40 (H) 0.4 - 1.00 MG/DL    Calcium 8.2 (L) 8.5 - 10.6 MG/DL    Total Protein 5.7 (L) 6.0 - 8.0 G/DL    Total Bilirubin 0.9 0.3 - 1.2 MG/DL    Albumin 3.2 (L) 3.5 - 5.0 G/DL    Alk Phosphatase 37 25 - 110 U/L    AST (SGOT) 54 (H) 7 - 40 U/L    CO2 23 21 - 30 MMOL/L    ALT (SGPT) 10 7 - 56 U/L    Anion Gap 5 3 - 12    eGFR Non African American 45 (L) >60 mL/min    eGFR African American 54 (L) >60 mL/min   MAGNESIUM    Collection Time: 02/05/17  4:15 AM   Result Value Ref Range    Magnesium 3.2 (H) 1.6 - 2.6 mg/dL   CBC    Collection Time: 02/05/17  4:15 AM   Result Value Ref Range    White Blood Cells 10.6 4.5 - 11.0 K/UL    RBC 3.33 (L) 4.0 - 5.0 M/UL    Hemoglobin 10.1 (L) 12.0 - 15.0 GM/DL    Hematocrit 98.1 (L) 36 - 45 %    MCV 91.9 80 - 100 FL    MCH 30.4 26 - 34 PG    MCHC 33.1 32.0 - 36.0 G/DL    RDW 19.1 (H) 11 - 15 %    Platelet Count 114 (L) 150 - 400 K/UL    MPV 8.4 7 - 11 FL   BLOOD GASES, ARTERIAL    Collection Time: 02/05/17  4:15 AM   Result Value Ref Range    pH-Arterial 7.28 (L) 7.35 - 7.45    pCO2-Arterial 47 (H) 35 - 45  MMHG    pO2-Arterial 98 80 - 100 MMHG    Base Deficit-Arterial 4.7 MMOL/L    O2 Sat-Arterial 97.4 95 - 99 %    Bicarbonate-ART-Cal 20.5 (L) 21 - 28 MMOL/L   POC GLUCOSE    Collection Time: 02/05/17  5:03 AM   Result Value Ref Range    Glucose, POC 165 (H) 70 - 100 MG/DL   POC GLUCOSE    Collection Time: 02/05/17  6:10 AM   Result Value Ref Range    Glucose, POC 147 (H) 70 - 100 MG/DL   AMMONIA    Collection Time: 02/05/17  7:15 AM   Result Value Ref Range    Ammonia 74 (H) 9 - 35 MCMOL/L   BLOOD GASES, ARTERIAL Collection Time: 02/05/17  9:37 AM   Result Value Ref Range    pH-Arterial 7.29 (L) 7.35 - 7.45    pCO2-Arterial 50 (H) 35 - 45 MMHG    pO2-Arterial 83 80 - 100 MMHG    Base Deficit-Arterial 3.0 MMOL/L    O2 Sat-Arterial 96.1 95 - 99 %    Bicarbonate-ART-Cal 21.9 21 - 28 MMOL/L   POC GLUCOSE    Collection Time: 02/05/17  9:37 AM   Result Value Ref Range    Glucose, POC 137 (H) 70 - 100 MG/DL   BASIC METABOLIC PANEL    Collection Time: 02/05/17 12:10 PM   Result Value Ref Range    Sodium 133 (L) 137 - 147 MMOL/L    Potassium 4.5 3.5 - 5.1 MMOL/L    Chloride 104 98 - 110 MMOL/L    CO2 23 21 - 30 MMOL/L    Anion Gap 6 3 - 12    Glucose 127 (H) 70 - 100 MG/DL    Blood Urea Nitrogen 17 7 - 25 MG/DL    Creatinine 2.95 (H) 0.4 - 1.00 MG/DL    Calcium 8.3 (L) 8.5 - 10.6 MG/DL    eGFR Non African American 41 (L) >60 mL/min    eGFR African American 49 (L) >60 mL/min   IONIZED CALCIUM    Collection Time: 02/05/17 12:10 PM   Result Value Ref Range    Ionized Calcium 1.17 1.0 - 1.3 MMOL/L   BLOOD GASES, ARTERIAL    Collection Time: 02/05/17 12:10 PM   Result Value Ref Range    pH-Arterial 7.30 (L) 7.35 - 7.45    pCO2-Arterial 48 (H) 35 - 45 MMHG    pO2-Arterial 110 (H) 80 - 100 MMHG    Base Deficit-Arterial 3.2 MMOL/L    O2 Sat-Arterial 98.1 95 - 99 %    Bicarbonate-ART-Cal 21.8 21 - 28 MMOL/L   POC GLUCOSE    Collection Time: 02/05/17 12:12 PM   Result Value Ref Range    Glucose, POC 129 (H) 70 - 100 MG/DL   POC GLUCOSE    Collection Time: 02/05/17  2:15 PM   Result Value Ref Range    Glucose, POC 115 (H) 70 - 100 MG/DL   POC GLUCOSE    Collection Time: 02/05/17  3:09 PM   Result Value Ref Range    Glucose, POC 117 (H) 70 - 100 MG/DL   BLOOD GASES, ARTERIAL    Collection Time: 02/05/17  4:05 PM   Result Value Ref Range    pH-Arterial 7.30 (L) 7.35 - 7.45    pCO2-Arterial 47 (H) 35 - 45 MMHG    pO2-Arterial 160 (H) 80 - 100 MMHG    Base Deficit-Arterial 3.6 MMOL/L  O2 Sat-Arterial 99.3 (H) 95 - 99 % Bicarbonate-ART-Cal 21.5 21 - 28 MMOL/L       Point of Care Testing  (Last 24 hours)  Glucose: (!) 127 (02/05/17 1210)  POC Glucose (Download): (!) 117 (02/05/17 1509)    Radiology and other Diagnostics Review:    Pertinent radiology reviewed.    Obie Dredge, APRN   Pager [phone]

## 2017-02-05 NOTE — Progress Notes
GI STAFF    She underwent coronary artery bypass grafting on 9 4.  I reviewed with Dr. Alfonso Ramus and things went well.    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  She is currently scheduled with Dr. Brigitte Pulse (with Dr. Caesar Chestnut) on 04/16/2017 at 3:30 at [facility].    GI will sign off at this time.  Please call with any questions.

## 2017-02-05 NOTE — Case Management (ED)
Case Management Progress Note    NAME:Diane Savage                          MRN: [id number]              DOB:[birth year]          AGE: 61 y.o.  ADMISSION DATE: 01/31/2017             DAYS ADMITTED: LOS: 5 days      Todays Date: 02/05/2017    Plan  Plan for CABG today and transfer to CTS service     Interventions  ? Support      ? Info or Referral      ? Discharge Planning    SW attempted to meet with pt to complete CM assessment, pt currently being transported off unit for CABG     SW updated CTS SW and NCM    ? Medication Needs      ? Financial      ? Legal      ? Other        Disposition  ? Expected Discharge Date    Expected Discharge Date: 02/08/17  ? Transportation      ? Next Level of Care (Acute Psych discharges only)      ? Discharge Disposition                                          Durable Medical Equipment     No service has been selected for the patient.      [place] Destination     No service has been selected for the patient.      [place]     No service has been selected for the patient.       Dialysis/Infusion     No service has been selected for the patient.          Currie Paris, LMSW  p. [phone]

## 2017-02-05 NOTE — Progress Notes
CARDIOPULMONARY REHABILITATION  INPATIENT ASSESSMENT    Cardiac Rehabilitation Staff: Karie Chimera Discharge Date:     Demographics  Pre-admit Dx: Chest Pain, Coronary Artery Disease Date of Admission: 01/31/2017     Room: HC302/01 DOB:  1956-02-24   Insurance: Primary: ______  Secondary: Unknown   Address: [address] [place] [address]   Patient Phone:  [phone] (home)    Marital Status: Married  Occupation: Unknown   ED Contact: Alean Rinne  ED Phone #: [phone]   CTS: Alfonso Ramus  Cardiologist: ______     Cardiac Procedures and Events  CABG: 02/04/17       NSTEMI: 01/31/17                    Risk Factors  Risk Factors: Hypertension, Obesity, Hyperlipidemia, Diabetes-type II  BP: 133/55  Height: 162.6 cm (64.02")  Weight: 117.3 kg (258 lb 9.6 oz)  BMI (Calculated): 42.92      Medical History   has a past medical history of Acquired hypothyroidism; Arthritis; Back pain; Bleeding disorder ([place]); Coronary artery disease; Diabetes mellitus ([place]); Hypertension; Stomach disorder; and Vision decreased.    Labs  Cholesterol   Date Value Ref Range Status   02/05/2017 93 <200 MG/DL Final     Triglycerides   Date Value Ref Range Status   02/05/2017 91 <150 MG/DL Final     HDL   Date Value Ref Range Status   02/05/2017 33 (L) >40 MG/DL Final     LDL   Date Value Ref Range Status   02/05/2017 46 <100 MG/DL Final     Hemoglobin A1C   Date Value Ref Range Status   01/31/2017 7.1 (H) 4.0 - 6.0 % Final     Comment:     The ADA recommends that most patients with type 1 and type 2 diabetes maintain   an A1c level <7%.       Troponin-I   Date Value Ref Range Status   02/01/2017 0.91 (H) 0.0 - 0.05 NG/ML Final         Heart Resource Manual Given:       Teaching Completed:       Outpatient Cardiopulmonary Rehabilitation    OPCR:      Referral Faxed to:       Date Faxed:      Location:      [place], Sent to Staff:            Karie Chimera  02/05/2017

## 2017-02-05 NOTE — Progress Notes
Pt assessment completed. Sedated on Dex 1 mcg Spontaneously awakens to stimuli and pain. Not follow commands consistently at this time. Will continue to wean and awaken for extubation goal.    A paced AAI 80/10/0.5. V wires in place. Upon assessment V wires disconnected from box. CI > 2.5. MAP > 70. Minimal CT drng. Warm and well perfused with palpable pulses.    Orally intubated 7.5 22 cm teeth. Will admin treatments and plan for extubation as tolerated.    OGT LIS scant output.    Foley clear urine. Will monitor low output.    Gtts:  Dex 1 mcg  NE 0.03 mcg  Vaso 2.4 units  Insulin per Modified [facility]  NS 30    [facility] completed.

## 2017-02-05 NOTE — Progress Notes
Verified pt temp by axillary temp. Correlating to 95 F/35 C. Turned up temp in room and angled fan away from pt. Blankets on.     Re-enforced need for warmer temp despite pt stating that she is not cold or shivering.    Will monitor.

## 2017-02-05 NOTE — Progress Notes
RESPIRATORY THERAPY  ADULT PROTOCOL EVALUATION      RESPIRATORY PROTOCOL PLAN    Medications       Note: If indicated by protocol, medication orders will be placed by therapist.    Procedures  PEP Therapy: Q4h PEP While Awake  PAP: Q4h PAP While Awake  IPPB: Place a nursing order for IS Q1h While Awake for any of Lung Expansion indicators  Oxygen/Humidity: O2 to keep SpO2 > 95%  Monitoring: Pulse oximetry Q6h & PRN       _____________________________________________________________    PATIENT EVALUATION RESULTS    Chart Review  * Pulmonary Hx: Smoker in home OR smoking cessation > 8 weeks (former smoker)    * Surgical Hx: Thoracic or abdominal surgery/injury (LE) (CABG)    * Chest X-Ray: Infiltrates (AC) OR atelectasis (LE) OR pleural effusion OR rib fractures (LE) (atelectasis/effusion)    * PFT/Oxygenation: FEV1, PEFR < 70% OR Pa02 < 70 RA OR Sp02 <92% RA OR Fi02 > 0.21 to keep Sp02 > 92% OR < 24 hours post-op (02 & oxim) OR chronic C02 retention (C02) (O2 post op)      Patient Assessment  * Respiratory Pattern: Regular pattern and rate OR good chest excursion with deep breathing    * Breath Sounds: Clear apically, but diminished in bases (LE) OR CHF related crackles (02) (oximetry)    * Cough / Sputum: Good effective cough OR occasional or minimal sputum OR thin sputum    * Mental Status: Alert, oriented, cooperative    * Activity Level: Ambulatory with assistance      Priority Index  Total Points: 11 Points  * Priority Index: 2    PRIORITY INDEX GUIDELINES*  Priority Points   1 0-9 points   2 9-18 points   3 > 18 points   + Pulm Dx or Home Rx   *Higher points indicate higher acuity.      Therapist: Virgel Manifold, RT  Date: 02/04/2017      Key  AC = Airway clearance  AM = Aerosolized medication  BA = Bland aerosol  DB&C = Deep breathe & cough  FEV1 = Forced expiratory volume in first second)  IC = Inspiratory capacity  LE = Lung expansion  MDI = Metered dose inhaler  Neb = Nebulizer  O2 = Oxygen  Oxim =Oximetry PEFR = Peak expiratory flow rate  RRT = Rapid Response Team

## 2017-02-05 NOTE — Progress Notes
OCCUPATIONAL THERAPY  ASSESSMENT NOTE    Patient Name: Diane Savage                   Room/Bed: HC302/01  Admitting Diagnosis: Chest Pain  NSTEMI (non-ST elevated myocardial infarction) [facility])    Past Medical History:   Diagnosis Date   ??? Acquired hypothyroidism    ??? Arthritis    ??? Back pain    ??? Bleeding disorder (HCC)    ??? Coronary artery disease    ??? Diabetes mellitus (HCC)     Type II   ??? Hypertension    ??? Stomach disorder    ??? 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 Discussed with attending and okay to mobilize. No plan for permanent pacer.    Objective  Psychosocial Status: Willing and Cooperative to Participate  Persons Present: Physical Therapist    Home Living  Type of Home: House  Home Layout: One Level  Financial risk analyst / Tub: Psychologist, counselling;Tub/Shower Unit  Bathroom Toilet: Standard    Prior Function  Level Of Independence: Independent with ADLs and functional transfers;Independent with homemaking w/ ambulation  Lives With: Spouse  Receives Help From: None Needed  Other Function Comments: Patient reports she and spouse share responsibility for performing IADLs. Spouse can perform on his own if needed upon patient return home.    ADL's  Where Assessed: Edge of Bed;Chair  Eating Assist: Stand By Assist  Eating Deficits: Setup  Functional Transfer Assist: Minimal Assist  Functional Transfer Deficits: Increased Time to Complete  Comment: Educated patient on sternal precautions with mobility. Required minimal assist for supine/sit with HOB elevated. Required assist for moving hips forward due to height of bed. Stood and took steps to chair with hand hold assist of 2 and line management. Patient declined grooming at this time due to nausea.     Activity Tolerance  Endurance: 2/5 Tolerates 10-20 Minutes Exercise w/Multiple Rests  Sitting Balance: 3+/5 Sits w/o UE Support for 30 Seconds or Greater    Cognition  Overall Cognitive Status: WFL to Adequately Complete Self Care Tasks Safely    Education  Persons Educated: Patient  Barriers To Learning: None Noted  Teaching Methods: Verbal Instruction  Patient Response: Verbalized Understanding  Topics: Role of OT, Goals for Therapy  Goal Formulation: With Patient    Assessment  Assessment: Decreased ADL Status;Decreased Endurance;Decreased Self-Care Trans;Decreased High-Level ADLs  Prognosis: Good  Goal Formulation: Patient  Comments: Anticipate patient will progress well as nausea is managed and she has less lines to coordinate.     AM-PAC 6 Clicks Daily Activity Inpatient  Putting on and taking off regular lower body clothes?: A Lot  Bathing (Including washing, rinsing, drying): A Lot  Toileting, which includes using toilet, bedpan, or urinal: Total  Putting on and taking off regular upper body clothing: A Lot  Taking care of personal grooming such as brushing teeth: A Little  Eating meals?: A Little  Daily Activity Raw Score: 13  Standardized (t-scale) score: 32.03  CMS 0-100% Score: 63.03  CMS G Code Modifier: CL    Plan   Progress:  Improving as Expected  OT Frequency: 5x/week  OT Plan for Next Visit: Work on standing endurance for ADLs, LE dressing as tolerated.    ADL Goals  Patient Will Perform Grooming: Standing at Sink;w/ Mod Independent  Patient Will Perform LE Dressing: w/ Modified Independent  Patient Will Perform Toileting: w/ Modified Independent    Functional Transfer Goals  Pt Will Perform All Functional Transfers: Modified Independent    OT Discharge Recommendations  OT Discharge Recommendations: Home with family assist  Equipment Recommendations: Shower Chair Therapist: Azucena Kuba, OTR/L  Date: 02/05/2017

## 2017-02-05 NOTE — Progress Notes
Patient had undergone capsule endoscopy for evaluation of iron deficiency anemia after multiple EGD and colonoscopy did not reveal any source of significant GI bleed.  Her hemoglobin has remained stable during hospital course.  Patient will be undergoing CABG procedure today.    Capsule endoscopy findings:  - GAVE    Recommendations:  - EGD for treatment of gave once patient is medically stable  - This can be done as an outpatient  - I will arrange for GI clinic follow-up    We will sign off, please page [place] with any questions or concerns.    Patient was discussed with Dr. Clide Cliff

## 2017-02-05 NOTE — Progress Notes
PHYSICAL THERAPY  ASSESSMENT     MOBILITY:  Progressive Mobility Level: Active transfer to chair  Level of Assistance: Assist X2  Assistive Device: Hand Held  Time Tolerated: 0-10 minutes  Activity Limited By: Nausea    SUBJECTIVE:  Significant hospital events:  s/p CABG x4 on 9/4 - currently with Temp PPM (underlying rhythm - discussed with attending - ok to mobilize), no currently plans for permanent pacer, 2 Chest Tubes - okay to disconnect from suction for mobility    Mental / Cognitive Status: Alert;Oriented;Cooperative  Persons Present: Occupational Therapist  Pain: Patient has no complaint of pain  Precautions: Sternal Precautions    Ambulation Assist: Independent Mobility in Community without Device  Patient Owned Equipment: 4-Wheeled Audiological scientist  Home Situation: Lives with Family (spouse - uses power wheelchair - not able to physically assist, but can assist with homemaking)  Type of Home: House  Entry Stairs: Ramp    The patient notes independence with ADLs and IADLs. Is a retired Char[employer] fundraiser.    STRENGTH:  Overall Strength: WFL    BED MOBILITY/TRANSFERS:  Bed Mobility: Supine to Sit: Minimal Assist    Transfer Type: Sit to Stand  Transfer: Assistance Level: To/From;Bed;Minimal Assist;x2 People  Transfer: Assistive Device: Chief of Staff Assist    The patient with complaints of nausea with position changes, MAP remains >65. The patient sat edge of bed independently for 6 minutes with LE exercises performed prior to attempt at standing.       GAIT:  Gait Distance: 3-5 steps to bedside chair with minimal assist x 2     EDUCATION:  Persons Educated: Patient  Patient Barriers To Learning: None Noted  Interventions: Repetition of Instructions  Teaching Methods: Verbal Instruction  Patient Response: Verbalized Understanding  Topics: Plan/Goals of PT Interventions;Importance of Increasing Activity    ASSESSMENT/PROGRESS:  Assessment/Progress: Should Improve w/ Continued PT    AM-PAC 6 Clicks Basic Mobility Inpatient Turning from your back to your side while in a flat bed without using bed rails: A Little  Moving from lying on your back to sitting on the side of a flatbed without using bedrails : A Little  Moving to and from a bed to a chair (including a wheelchair): A Little  Standing up from a chair using your arms (e.g. wheelchair, or bedside chair): A Little  To walk in hospital room: A Little  Climbing 3-5 steps with a railing: A Little  Raw Score: 18  Standardized (T-scale) Score: 41.05  Basic Mobility CMS 0-100%: 40.47  CMS G Code Modifier for Basic Mobility: CK    G-Codes: Mobility  [id number] Current Status:  40-59% Impairment  G8979 Goal Status:  1-19% Impairment  G8980 Discharge Status:      Based on above evaluation and clinical judgment.      GOALS:  Goal Formulation: With Patient  Time For Goal Achievement: 1 day, To, 5 days  Pt Will Go Supine To/From Sit: w/ Stand By Assist  Pt Will Transfer Sit to Stand: w/ Stand By Assist  Pt Will Ambulate: Greater than 200 Feet, w/ Walker, w/ Stand By Assist    PLAN:  Plan   Plan Frequency: 5 Days per Week  Plan for progression: Attempt ambulation next session with consideration for underlying heart rhythm    RECOMMENDATIONS:  PT Discharge Recommendations: Home with Assistance  Equipment Recommendations: Patient owns necessary equipment (roller walker)    Therapist: Elvina Sidle, PT  Date: 02/05/2017

## 2017-02-05 NOTE — Anesthesia Pain Rounding
Anesthesia Follow-Up Evaluation: Post-Procedure Day One    Name: Diane Savage     MRN: [id number]     DOB: 02/14/1956     Age: 61 y.o.     Sex: female   __________________________________________________________________________     Procedure Date: 02/04/2017   Procedure: Procedure(s):  CORONARY ARTERY BYPASS WITH ARTERIAL GRAFT - 4 GRAFTS (Internal Mammary Artery and Endovascular Vein Harvest)  MAZE PROCEDURE    Physical Assessment  Height: 162.6 cm (64.02)  Weight: 117.3 kg (258 lb 9.6 oz)    Vital Signs (Last Filed in 24 hours)  Temp: 36.4 ???C (97.5 ???F) (09/05 1400)  Pulse: 66 (09/05 1400)  Respirations: 25 PER MINUTE (09/05 1400)  SpO2: 100 % (09/05 1400)  O2 Delivery: Nasal Cannula (09/05 1400)  SpO2 Pulse: 64 (09/05 1400)    Patient History   Allergies  Allergies   Allergen Reactions   ??? Diltiazem RASH, NAUSEA AND VOMITING and SEE COMMENTS     Sore mouth   ??? Sulfa (Sulfonamide Antibiotics) RASH   ??? Duricef [Cefadroxil] NAUSEA AND VOMITING   ??? Pravastatin NAUSEA AND VOMITING   ??? Simvastatin NAUSEA AND VOMITING        Medications  Scheduled Meds:  acetaminophen (TYLENOL) tablet 1,000 mg 1,000 mg Oral Q6H*   aspirin chewable tablet 81 mg 81 mg Oral QDAY   cefTRIAXone (ROCEPHIN) IVP 1 g 1 g Intravenous Q24H*   levothyroxine (SYNTHROID) tablet 175 mcg 175 mcg Oral QDAY(07)   lidocaine (LIDODERM) 5 % topical patch 1-2 patch 1-2 patch Topical QDAY   metoclopramide (REGLAN) injection 10 mg 10 mg Intravenous Q6H*   pantoprazole (PROTONIX) injection 80 mg 80 mg Intravenous BID(11-21)   polyethylene glycol 3350 (MIRALAX) packet 17 g 1 packet Oral QDAY   rosuvastatin (CRESTOR) tablet 40 mg 40 mg Oral QDAY   senna/docusate (SENOKOT-S) tablet 2 tablet 2 tablet Oral BID   Continuous Infusions:  ??? insulin regular (NOVOLIN R) 100 Units in sodium chloride 0.9% (NS) 100 mL IV drip (std conc) 5 Units/hr (02/05/17 0710)   ??? nitroGLYCERIN 50 mg/D5W 250 mL infusion     ??? norepinephrine (LEVOPHED) 4 mg in sodium chloride 0.9% (NS) 250 mL IV drip (std conc) 0.04 mcg/kg/min (02/05/17 1040)   ??? sodium chloride 0.9 %   infusion 30 mL/hr at 02/05/17 0646   ??? vasopressin (VASOSTRICT) 20 Units in sodium chloride 0.9% (NS) 100 mL IV drip (std conc) 3 Units/hr (02/05/17 1251)     PRN and Respiratory Meds:[START ON 02/09/2017] acetaminophen Q6H PRN **OR** [START ON 02/09/2017] acetaminophen Q6H PRN, alum/mag hydroxide/simeth Q4H PRN, bisacodyl QDAY PRN, fentaNYL citrate PF Q1H PRN, hydrALAZINE Q6H PRN, lidocaine PF PRN, milk of magnesia (CONC) QDAY PRN, ondansetron Q6H PRN **OR** ondansetron (ZOFRAN) IV Q6H PRN, oxyCODONE Q4H PRN, potassium chloride SR PRN **OR** potassium chloride PRN **OR** potassium chloride in water PRN      Diagnostic Tests  Hematology: Lab Results   Component Value Date    HGB 10.1 02/05/2017    HCT 30.6 02/05/2017    PLTCT 114 02/05/2017    WBC 10.6 02/05/2017    NEUT 70 02/01/2017    ANC 5.60 02/01/2017    ALC 1.20 02/01/2017    MONA 12 02/01/2017    AMC 1.00 02/01/2017    EOSA 3 02/01/2017    ABC 0.00 02/01/2017    MCV 91.9 02/05/2017    MCH 30.4 02/05/2017    MCHC 33.1 02/05/2017    MPV 8.4 02/05/2017    RDW  17.1 02/05/2017         General Chemistry: Lab Results   Component Value Date    NA 133 02/05/2017    K 4.5 02/05/2017    CL 104 02/05/2017    CO2 23 02/05/2017    GAP 6 02/05/2017    BUN 17 02/05/2017    CR 1.33 02/05/2017    GLU 127 02/05/2017    CA 8.3 02/05/2017    ALBUMIN 3.2 02/05/2017    LACTIC 1.3 02/01/2017    OBSCA 1.17 02/05/2017    MG 3.2 02/05/2017    TOTBILI 0.9 02/05/2017    PO4 1.8 02/01/2017      Coagulation:   Lab Results   Component Value Date    PTT 26.8 02/04/2017    INR 1.3 02/04/2017         Follow-Up Assessment  Patient location during evaluation: ICU      Anesthetic Complications:   Anesthetic complications: The patient did not experience any anesthestic complications.      Pain:  Score: 5    Management:adequate     Level of Consciousness: awake and alert   Hydration:acceptable     Airway Patency: patent Respiratory Status: acceptable, spontaneous ventilation and room air     Cardiovascular Status:acceptable and hemodynamically stable   Regional/Neuroaxial:

## 2017-02-05 NOTE — Progress Notes
Cardiothoracic Surgery Critical Care   Progress Note     Diane Savage  Today's Date:  02/04/2017  Admission Date: 01/31/2017  LOS: 4 days    POD: 0    Procedure: CORONARY ARTERY BYPASS WITH ARTERIAL GRAFT - 4 GRAFTS (Internal Mammary Artery and Endovascular Vein Harvest): [id number] (CPT???)  MAZE PROCEDURE:     Active Problems:    NSTEMI (non-ST elevated myocardial infarction) (HCC)    Type 2 diabetes mellitus with circulatory disorder, without long-term current use of insulin (HCC)    PAF (paroxysmal atrial fibrillation) (HCC)    Atrial fibrillation with rapid ventricular response (HCC)    Chronic gastrointestinal bleeding    Transfusion-dependent anemia    CAD (coronary artery disease)    HLD (hyperlipidemia)    Hypothyroidism    Hepatitis B    Vasogenic shock (HCC)    Hyperammonemia (HCC)    Obstructive pattern present on pulmonary function testing    Respiratory acidosis        Assessment/Plan:      Neuro ??? Continue to wean from sedation to work towards extubation.  Continue PRN Fentanyl, oxycodone with scheduled Tylenol.   CV ??? A-paced at 80bpm, HX of PAF, MAZE procedure performed.  She is not currently on anticoagulation due to chronic GI bleeding.  MAP goal of 65 mmHg or SBP <110 mmHg, titrate levophed as needed, add vasopressin at 2.4 units/hr for vasogenic shock.  Fluid challenged on arrival for labile BP.  CI: 2.5 on arrival, PA 40/18 mmHg, CVP 12 mmHg.  Cont ASA 81mg  (hold for plts <80k) and statin. Hold BB and ACEI in the initial post op phase. Intra op TEE LVEF 60% with LVH.  Resp ??? Daily CXR ??? bedside interpretation: lungs expanded, mild pulmonary vascular congestion with bibasilar atelectasis.  Will await radiology report.  ABG: 7.32, 50, 186, 25.6, adjust mV and wean O2.  FEV 1: 52% will add trial of SBA while on vent.  Wean FiO2. Initiate IS, aggressive pulm toilet once extubated.  Monitor chest tube drainage closely.   Renal ??? Baseline Scr 1.05 - 1.81 recent AKI.  Monitor BMP/UOP, assess for AKI. GI - NPO with NGT, start post op bowel regimen on POD 1.  HX of chronic GI bleeding being followed by GI, continue Protonix BID per recommendations.  Ammonia level was 70 prior to surgery, hepatology has consulted.   ID ??? Continue standard post op antibiotic for prophylaxis, per CTS protocol.  Heme ??? Check post op CBC/coags, assess for coagulopathy.  Hold DVT prophylaxis until cleared by CTS, continue mechanical prophylaxis.  HX of chronic GI bleeding associated with chronic anticoagulation.  She has had multiple transfusions and is significant for JK antibodies.    FEN ??? If creatinine < 2.0, replace Mg and K per CTS post op protocol. Hgb A1c 7.1%.  Insulin gtt per Modified [facility] Protocol.    Activity ??? HOB to 30 degrees over 1st postop hour. Bedrest until extubated, dangle 6 hours after extubation. Early cardiac PT/OT.     Prophylaxis Review:  Lines:  Yes; Arterial Line; Indication:  Frequent blood draws and Continuous BP monitoring; Location:  Radial  Central Line; Indication:  Med not deliverable peripherally and Hemodynamic monitoring; Type:  Internal jugular  Antibiotic Usage:  Continue standard post op antibiotic for prophylaxis, per CTS protocol.  VTE:  Mechanical prophylaxis; Foot pump  Urinary Catheter: Yes; Retain foley due to:  Need for accurate Intake and Output    Disposition: The patient is post-cardiac  surgery at at risk for life threatening deterioration. Patient is critically ill s/p CABG x 4 with MAZE procedure.  She will recover in the ICU.      I have seen, personally fully evaluated, and discussed patient with the critical care attending and cardiothoracic surgeon. The patient is critically ill, I spent 60 minutes (excluding time spent performing procedures) providing and personally directing critical care services including direct OR recovery, ventilator management, hemodynamic monitoring and management, lab and radiology review, medication review and management, fluid and electrolyte management and coordination of care.     Leland Her, APRN,FNP-BC   CTS Intensive Care  Pager [phone]  02/04/2017    Subjective:       HPI:   Diane Savage is a 61 y.o. female with PMH significant for pA-fib, HTN, HLD, NIDDM, Chronic blood anemia, transfusion dependent anemia, CAD, hypothyroidism who presented to OSH with complains of chest pain. Patient states that she has had chest pain for months. Troponin was elevated at 0.3.  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 She also developed A fib with RVR at OSH. Her Hgb at the OSH was 6.3 on arrival. Patient was transferred to [facility] for further care.  Anemia has been presents for years and has had an extensive GI work up including EGD, capsule and CE which were all negative. She was taken off of anticoagulation given her chronic anemia.  Cath revealed 80% LMCA stenosis and high-grade RCA lesion as well.  She presents today to the ICU S/P CABG x 4 with MAZE procedure.        REVIEW OF SYSTEMS:   Review of systems not obtained from patient due to patient factors.      Objective:        Medications:  Scheduled Meds:    acetaminophen (TYLENOL) tablet 1,000 mg 1,000 mg Oral Q6H*   albuterol 0.083% (PROVENTIL; VENTOLIN) nebulizer solution 2.5 mg 2.5 mg Inhalation Q4H & PRN   aspirin chewable tablet 81 mg 81 mg Oral QDAY   [START ON 02/05/2017] ceFAZolin (ANCEF) IVP 2 g 2 g Intravenous Q8H*   cephalexin (KEFLEX) capsule 500 mg 500 mg Oral QID   ipratropium bromide (ATROVENT) 0.02 % nebulizer solution 0.5 mg 0.5 mg Inhalation Q4H & PRN   levothyroxine (SYNTHROID) tablet 175 mcg 175 mcg Oral QDAY(07)   magnesium sulfate   4 g/50 mL IVPB 4 g Intravenous ONCE   pantoprazole (PROTONIX) injection 80 mg 80 mg Intravenous BID(11-21)   rosuvastatin (CRESTOR) tablet 40 mg 40 mg Oral QDAY   [START ON 02/05/2017] senna/docusate (SENOKOT-S) tablet 2 tablet 2 tablet Oral BID   Continuous Infusions: ??? aminocaproic acid (AMICAR) 12.5 g in sodium chloride 0.9% (NS) IV infusion 2 g/hr (02/04/17 1838)   ??? dexmedetomidine (PRECEDEX) 400 mcg in sodium chloride 0.9% (NS) 100 mL IV infusion 1 mcg/kg/hr (02/04/17 1840)   ??? insulin regular (NOVOLIN R) 100 Units in sodium chloride 0.9% (NS) 100 mL IV drip (std conc) 5 Units/hr (02/04/17 1913)   ??? nitroGLYCERIN 50 mg/D5W 250 mL infusion     ??? norepinephrine (LEVOPHED) 4 mg in dextrose 5% (D5W) 250 mL IV drip (std conc) 0.03 mcg/kg/min (02/04/17 1852)   ??? sodium chloride 0.9 %   infusion 30 mL/hr at 02/04/17 1838   ??? vasopressin (VASOSTRICT) 20 Units in sodium chloride 0.9% (NS) 100 mL IV drip (std conc) 2.4 Units/hr (02/04/17 1830)     PRN and Respiratory Meds:[START ON 02/09/2017] acetaminophen Q6H PRN **OR** [START ON 02/09/2017]  acetaminophen Q6H PRN, alum/mag hydroxide/simeth Q4H PRN, atropine Once PRN, bisacodyl QDAY PRN, fentaNYL citrate PF Q1H PRN, hydrALAZINE Q6H PRN, lidocaine PF PRN, milk of magnesia (CONC) QDAY PRN, ondansetron Q6H PRN **OR** ondansetron (ZOFRAN) IV Q6H PRN, oxyCODONE Q4H PRN, potassium chloride SR PRN **OR** potassium chloride PRN **OR** potassium chloride in water PRN                       Vital Signs: Last Filed                  Vital Signs: 24 Hour Range   BP: 133/55 (09/04 1200)  Temp: 36.4 ???C (97.5 ???F) (09/04 1908)  Pulse: 80 (09/04 1908)  Respirations: 18 PER MINUTE (09/04 1908)  SpO2: 99 % (09/04 1908)  O2 Delivery: Endotracheal Tube (Oral) (09/04 1900)  SpO2 Pulse: 80 (09/04 1908)  Height: 162.6 cm (64.02) (09/04 1130) BP: (123-159)/(35-62)   ABP: (85-132)/(41-61)   Temp:  [36.4 ???C (97.5 ???F)-37.1 ???C (98.8 ???F)]   Pulse:  [66-84]   Respirations:  [11 PER MINUTE-26 PER MINUTE]   SpO2:  [74 %-100 %]   O2 Delivery: Endotracheal Tube (Oral)   Intensity Pain Scale 0-10 (Pain 1): 0 (02/04/17 1145) Vitals:    02/03/17 0630 02/04/17 0400 02/04/17 1130   Weight: 113 kg (249 lb 1.9 oz) 113.5 kg (250 lb 3.2 oz) 113.5 kg (250 lb 3.2 oz) Intake/Output Summary:  (Last 24 hours)    Intake/Output Summary (Last 24 hours) at 02/04/17 1935  Last data filed at 02/04/17 1900   Gross per 24 hour   Intake           3286.4 ml   Output             1130 ml   Net           2156.4 ml         Physical Exam:         Neuro: Sedated, MAE   Cardiovascular: RRR no rub or murmur, A-paced at 80 bpm  Respiratory: LS CTA bil - diminished in the bases  GI: soft, NT, hypoactive BS  Extremities: trace ankle edema Edema  Incisions: Sternal incision dressing, dry and intact. No crepitus or sternal instability.      Chest Tubes OUTPUT on arrival  AIR LEAK PRESENT   Mediastinal 70 no   Mediastinal     Pleural  55 no    Pleural        Artificial airway:  Endotracheal Tube                                                                                         Vent weaning trial:  Per protocol    Laboratory:  LABS:  Recent Labs      02/02/17   0400  02/03/17   0428  02/04/17   0450  02/04/17   1845   NA  133*  136*  132*  137   K  3.8  3.8  4.0  4.3   CL  99  102  101  107   CO2  27  29  26  26    GAP  7  5  5  4    BUN  21  15  12  11    CR  1.75*  1.20*  1.05*  0.97   GLU  137*  255*  221*  154*   CA  8.4*  8.3*  8.5  8.2*   ALBUMIN  3.1*  2.9*  3.2*   --    MG  1.8  2.0  2.1  2.8*       Recent Labs      02/01/17   2100  02/02/17   0400  02/02/17   1330  02/02/17   2050  02/03/17   0428  02/03/17   1136  02/03/17   1304  02/03/17   1720  02/03/17   2120  02/03/17   2355  02/04/17   0450  02/04/17   0645  02/04/17   1845   WBC  7.1  6.4  5.4  4.5  4.5   --   5.6   --   4.5   --   4.9   --   14.1*   HGB  9.0*  8.2*  8.1*  8.0*  7.8*   --   9.5*   --   8.7*   --   9.0*   --   10.6*   HCT  25.8*  24.5*  24.4*  24.2*  24.0*   --   28.7*   --   26.3*   --   27.0*   --   32.1*   PLTCT  164  143*  137*  131*  128*   --   147*   --   125*   --   126*   --   131*   INR   --   1.3*   --    --    --    --    --    --    --    --    --    --   1.3* PTT  24.9  25.7  35.8  41.4*  56.9*  61.0*   --   60.4*   --   59.9*   --   62.3*  26.8   AST   --   28   --    --   19   --    --    --    --    --   21   --    --    ALT   --   9   --    --   3*   --    --    --    --    --   8   --    --    ALKPHOS   --   50   --    --   47   --    --    --    --    --   49   --    --       Estimated Creatinine Clearance: 76.1 mL/min (based on SCr of 0.97 mg/dL).  Vitals:    02/03/17 0630 02/04/17 0400 02/04/17 1130   Weight: 113 kg (249 lb 1.9 oz) 113.5 kg (250 lb 3.2 oz) 113.5 kg (250 lb 3.2 oz)      Recent Labs  02/02/17   1117   PHART  7.44   PO2ART  83           Radiology and Other Diagnostic Procedures Review:    Reviewed

## 2017-02-05 NOTE — Consults
Admission Date: 01/31/2017  Date of Consultation:  02/05/2017  LOS: 5 days    Consulting Physician:  Dr Betti Cruz    Reason for Consultation  Opinion and recommendations regarding post op junctional bradycardia      History of Present Illness  This is a 61 y.o. 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   She was seen in [place] with complaints of chest pain and noted to have a hemoglobin of 6 and was given blood transfusion.  Troponins were mildly elevated at 0.97 consistent with NSTEMI.   She underwent cardiac catheterization demonstrating 80% LM, 80% ostial Lcx followed by 70%, RCA with 50% followed by 70%.  She was transferred to [facility] for CABG.  Her echo doppler on admission was technically difficult but with preserved LV function. She is POD # 1 from CABG ( LIMA to LAD, rSVG to OM/PLbranch, rSVG to PDA and CryoMAZE/suture ligation of LA appendage.   Ep consult for possible post op junctional/bradycardia    1. Hx paroxysmal atrial fibrillation post op MAZE/LAA ligation POD #1  - Received Toprol XL 50 mg yesterday am at 0824 and Amiodarone 200 mg.  Currently both on hold  - TPM post op with increased MA setting.    - ECG early this am with TPM off shows sinus bradycardia with low amplitude in P waves  - telemetry reviewed now with SR rates 68    2. CAD with severe TVD  - Cardiac cath: 80% LM, Ostial Lcx 80%, RCA with 50% followed by 70%  - Echo doppler 9/1 with preserved EF  - POD # 1 CABG: LIMA to LAD, rSVG to OM/PL branch, rSVG to PDA    3. Hypotension with pressors  - Levophed 0.4 mcg/kg/min for MAP > 70  - Systolic's 101-119,   MAP 73-76  ???  Plan:   Reviewed ECG and telemetry. Diminutive P-waves post MAZE procedure.  Currently in Sinus rhythm rates 60's.  No pacemaker indicated at this time. Would hold off Beta blockers until off pressors. Above discussed with Attending Dr Betti Cruz who agrees.     Roberts Gaudy PA-c [phone])    Past Medical History  Past Medical History:   Diagnosis Date   ??? Acquired hypothyroidism    ??? Arthritis    ??? Back pain    ??? Bleeding disorder (HCC)    ??? Coronary artery disease    ??? Diabetes mellitus (HCC)     Type II   ??? Hypertension    ??? Stomach disorder    ??? Vision decreased          Social History  Social History     Social History   ??? Marital status: Married     Spouse name: N/A   ??? Number of children: N/A   ??? Years of education: N/A     Occupational History   ??? Not on file.     Social History Main Topics   ??? Smoking status: Former Smoker     Packs/day: 2.00     Years: 10.00     Types: Cigarettes     Quit date: 09/16/1980   ??? Smokeless tobacco: Never Used   ??? Alcohol use Yes      Comment: Seldom   ??? Drug use: No   ??? Sexual activity: Not on file     Other Topics Concern   ??? Not  on file     Social History Narrative   ??? No narrative on file         Family History  Family History   Problem Relation Age of Onset   ??? Diabetes Mother    ??? Heart Disease Mother    ??? High Cholesterol Mother    ??? Arthritis-rheumatoid Mother    ??? Stroke Mother    ??? Thyroid Disease Mother    ??? Coronary Artery Disease Mother    ??? Hyperlipidemia Mother    ??? Cancer Father    ??? Cancer-Breast Sister    ??? Cancer Sister    ??? Hypertension Sister    ??? Heart Disease Sister    ??? High Cholesterol Sister    ??? Arthritis-osteo Sister    ??? Migraines Sister    ??? Rashes/Skin Problems Sister    ??? Depression Sister    ??? Hyperlipidemia Sister    ??? Cancer Brother    ??? Hypertension Brother    ??? Arthritis-rheumatoid Brother    ??? Rashes/Skin Problems Brother    ??? Coronary Artery Disease Brother    ??? Heart Disease Maternal Aunt    ??? Coronary Artery Disease Paternal Uncle    ??? Hyperlipidemia Paternal Uncle    ??? Cancer Maternal Grandmother    ??? Diabetes Maternal Grandmother    ??? Heart Disease Maternal Grandmother    ??? Cancer-Colon Paternal Grandmother    ??? Cancer Paternal Grandmother ??? Diabetes Paternal Grandmother    ??? Cancer Paternal Grandfather        Medications    acetaminophen (TYLENOL) tablet 1,000 mg 1,000 mg Oral Q6H*   aspirin chewable tablet 81 mg 81 mg Oral QDAY   cefTRIAXone (ROCEPHIN) IVP 1 g 1 g Intravenous Q24H*   levothyroxine (SYNTHROID) tablet 175 mcg 175 mcg Oral QDAY(07)   lidocaine (LIDODERM) 5 % topical patch 1-2 patch 1-2 patch Topical QDAY   metoclopramide (REGLAN) injection 10 mg 10 mg Intravenous Q6H*   pantoprazole (PROTONIX) injection 80 mg 80 mg Intravenous BID(11-21)   polyethylene glycol 3350 (MIRALAX) packet 17 g 1 packet Oral QDAY   promethazine (PHENERGAN) injection 6.25 mg 6.25 mg Intravenous ONCE   rosuvastatin (CRESTOR) tablet 40 mg 40 mg Oral QDAY   senna/docusate (SENOKOT-S) tablet 2 tablet 2 tablet Oral BID     [START ON 02/09/2017] acetaminophen Q6H PRN **OR** [START ON 02/09/2017] acetaminophen Q6H PRN, alum/mag hydroxide/simeth Q4H PRN, bisacodyl QDAY PRN, fentaNYL citrate PF Q1H PRN, hydrALAZINE Q6H PRN, lidocaine PF PRN, milk of magnesia (CONC) QDAY PRN, ondansetron Q6H PRN **OR** ondansetron (ZOFRAN) IV Q6H PRN, oxyCODONE Q4H PRN, potassium chloride SR PRN **OR** potassium chloride PRN **OR** potassium chloride in water PRN    Allergies  Allergies   Allergen Reactions   ??? Diltiazem RASH, NAUSEA AND VOMITING and SEE COMMENTS     Sore mouth   ??? Sulfa (Sulfonamide Antibiotics) RASH   ??? Duricef [Cefadroxil] NAUSEA AND VOMITING   ??? Pravastatin NAUSEA AND VOMITING   ??? Simvastatin NAUSEA AND VOMITING       Review of Systems  A 10 point review of systems was ascertained and is otherwise negative and/or normal except for:  Post surgical pain                           Vital Signs: Most Recent                 Vital Signs: 24 Hour Range  Temp: 35.3 ???C (95.5 ???F) (09/05 1100)  Pulse: 79 (09/05 1200)  Respirations: 20 PER MINUTE (09/05 1200)  SpO2: 97 % (09/05 1200)  O2 Delivery: Nasal Cannula (09/05 0800)  SpO2 Pulse: 85 (09/05 1200) ABP: (85-132)/(41-61) Temp:  [34.9 ???C (94.8 ???F)-36.8 ???C (98.2 ???F)]   Pulse:  [58-87]   Respirations:  [10 PER MINUTE-53 PER MINUTE]   SpO2:  [94 %-100 %]   O2 Delivery: Nasal Cannula     Vitals:    02/04/17 0400 02/04/17 1130 02/05/17 0600   Weight: 113.5 kg (250 lb 3.2 oz) 113.5 kg (250 lb 3.2 oz) 117.3 kg (258 lb 9.6 oz)       Intake/Output Summary (Last 24 hours) at 02/05/17 1224  Last data filed at 02/05/17 1200   Gross per 24 hour   Intake          3949.26 ml   Output             1750 ml   Net          2199.26 ml      Stool Occurrence: 1    General Appearance: no acute distress  Skin: warm & intact  HEENT: unremarkable  Neck Veins: neck veins are flat & not distended  Carotid Arteries: no bruits  Chest Inspection: chest is normal in appearance  Auscultation/Percussion: lungs clear to auscultation, no rales, rhonchi, or wheezing  Cardiac Rhythm: regular rhythm & normal rate  Cardiac Auscultation: Normal S1 & S2, no S3 or S4, no rub  Murmurs: no cardiac murmurs   Extremities: no lower extremity edema; 2+ symmetric distal pulses  Abdominal Exam: soft, non-tender, no masses, bowel sounds normal  Liver & Spleen: no organomegaly  Neurologic Exam: oriented to time, place and person; no focal neurologic deficits  Psychiatric: Normal mood and affect.  Behavior is normal. Judgment and thought content normal.         Labs  Results for orders placed or performed during the hospital encounter of 01/31/17 (from the past 24 hour(s))   POC GLUCOSE    Collection Time: 02/04/17  1:16 PM   Result Value Ref Range    Glucose, POC 207 (H) 70 - 100 MG/DL   POC BLOOD GAS ARTERIAL    Collection Time: 02/04/17  1:19 PM   Result Value Ref Range    PH-ART-POC 7.50 (H) 7.35 - 7.45    PCO2-ART-POC 37 35 - 45 MMHG    PO2-ART-POC 275 (H) 80 - 100 MMHG    Base Ex-ART-POC 5.0 MMOL/L    O2 Sat-ART-POC 100.0 (H) 95 - 99 %    Bicarbonate-ART-POC 28.5 (H) 21 - 28 MMOL/L   POC HEMATOCRIT    Collection Time: 02/04/17  1:19 PM   Result Value Ref Range Hemoglobin POC 8.5 (L) 12.0 - 15.0 GM/DL    Hematocrit POC 16.1 (L) 36 - 45 %   POC POTASSIUM    Collection Time: 02/04/17  1:19 PM   Result Value Ref Range    Potassium-POC 4.0 3.5 - 5.1 MMOL/L   POC SODIUM    Collection Time: 02/04/17  1:19 PM   Result Value Ref Range    Sodium-POC 137 137 - 147 MMOL/L   POC IONIZED CALCIUM    Collection Time: 02/04/17  1:19 PM   Result Value Ref Range    Ionized Calcium-POC 1.12 1.0 - 1.3 MMOL/L   POC GLUCOSE    Collection Time: 02/04/17  2:01 PM   Result Value Ref Range  Glucose, POC 194 (H) 70 - 100 MG/DL   POC GLUCOSE    Collection Time: 02/04/17  2:05 PM   Result Value Ref Range    Glucose, POC 199 (H) 70 - 100 MG/DL   POC GLUCOSE    Collection Time: 02/04/17  3:12 PM   Result Value Ref Range    Glucose, POC 199 (H) 70 - 100 MG/DL   POC BLOOD GAS ARTERIAL    Collection Time: 02/04/17  3:15 PM   Result Value Ref Range    PH-ART-POC 7.45 7.35 - 7.45    PCO2-ART-POC 44 35 - 45 MMHG    PO2-ART-POC 502 (H) 80 - 100 MMHG    Base Ex-ART-POC 6.0 MMOL/L    O2 Sat-ART-POC 100.0 (H) 95 - 99 %    Bicarbonate-ART-POC 30.4 (H) 21 - 28 MMOL/L   POC HEMATOCRIT    Collection Time: 02/04/17  3:15 PM   Result Value Ref Range    Hemoglobin POC 8.2 (L) 12.0 - 15.0 GM/DL    Hematocrit POC 16.1 (L) 36 - 45 %   POC POTASSIUM    Collection Time: 02/04/17  3:15 PM   Result Value Ref Range    Potassium-POC 4.4 3.5 - 5.1 MMOL/L   POC SODIUM    Collection Time: 02/04/17  3:15 PM   Result Value Ref Range    Sodium-POC 138 137 - 147 MMOL/L   POC IONIZED CALCIUM    Collection Time: 02/04/17  3:15 PM   Result Value Ref Range    Ionized Calcium-POC 1.02 1.0 - 1.3 MMOL/L   POC GLUCOSE    Collection Time: 02/04/17  3:56 PM   Result Value Ref Range    Glucose, POC 221 (H) 70 - 100 MG/DL   POC BLOOD GAS ARTERIAL    Collection Time: 02/04/17  3:59 PM   Result Value Ref Range    PH-ART-POC 7.50 (H) 7.35 - 7.45    PCO2-ART-POC 38 35 - 45 MMHG    PO2-ART-POC 372 (H) 80 - 100 MMHG    Base Ex-ART-POC 6.0 MMOL/L O2 Sat-ART-POC 100.0 (H) 95 - 99 %    Bicarbonate-ART-POC 29.0 (H) 21 - 28 MMOL/L   POC HEMATOCRIT    Collection Time: 02/04/17  3:59 PM   Result Value Ref Range    Hemoglobin POC 7.1 (L) 12.0 - 15.0 GM/DL    Hematocrit POC 09.6 (L) 36 - 45 %   POC POTASSIUM    Collection Time: 02/04/17  3:59 PM   Result Value Ref Range    Potassium-POC 5.1 3.5 - 5.1 MMOL/L   POC SODIUM    Collection Time: 02/04/17  3:59 PM   Result Value Ref Range    Sodium-POC 137 137 - 147 MMOL/L   POC IONIZED CALCIUM    Collection Time: 02/04/17  3:59 PM   Result Value Ref Range    Ionized Calcium-POC 0.98 (L) 1.0 - 1.3 MMOL/L   POC GLUCOSE    Collection Time: 02/04/17  4:17 PM   Result Value Ref Range    Glucose, POC 216 (H) 70 - 100 MG/DL   POC BLOOD GAS ARTERIAL    Collection Time: 02/04/17  4:20 PM   Result Value Ref Range    PH-ART-POC 7.46 (H) 7.35 - 7.45    PCO2-ART-POC 41 35 - 45 MMHG    PO2-ART-POC 458 (H) 80 - 100 MMHG    Base Ex-ART-POC 6.0 MMOL/L    O2 Sat-ART-POC 100.0 (H) 95 - 99 %  Bicarbonate-ART-POC 29.5 (H) 21 - 28 MMOL/L   POC HEMATOCRIT    Collection Time: 02/04/17  4:20 PM   Result Value Ref Range    Hemoglobin POC 8.8 (L) 12.0 - 15.0 GM/DL    Hematocrit POC 45.4 (L) 36 - 45 %   POC POTASSIUM    Collection Time: 02/04/17  4:20 PM   Result Value Ref Range    Potassium-POC 4.8 3.5 - 5.1 MMOL/L   POC SODIUM    Collection Time: 02/04/17  4:20 PM   Result Value Ref Range    Sodium-POC 137 137 - 147 MMOL/L   POC IONIZED CALCIUM    Collection Time: 02/04/17  4:20 PM   Result Value Ref Range    Ionized Calcium-POC 1.00 1.0 - 1.3 MMOL/L   POC GLUCOSE    Collection Time: 02/04/17  4:44 PM   Result Value Ref Range    Glucose, POC 209 (H) 70 - 100 MG/DL   POC BLOOD GAS ARTERIAL    Collection Time: 02/04/17  4:47 PM   Result Value Ref Range    PH-ART-POC 7.46 (H) 7.35 - 7.45    PCO2-ART-POC 40 35 - 45 MMHG    PO2-ART-POC 464 (H) 80 - 100 MMHG    Base Ex-ART-POC 5.0 MMOL/L    O2 Sat-ART-POC 100.0 (H) 95 - 99 % Bicarbonate-ART-POC 28.3 (H) 21 - 28 MMOL/L   POC HEMATOCRIT    Collection Time: 02/04/17  4:47 PM   Result Value Ref Range    Hemoglobin POC 8.8 (L) 12.0 - 15.0 GM/DL    Hematocrit POC 09.8 (L) 36 - 45 %   POC POTASSIUM    Collection Time: 02/04/17  4:47 PM   Result Value Ref Range    Potassium-POC 4.6 3.5 - 5.1 MMOL/L   POC SODIUM    Collection Time: 02/04/17  4:47 PM   Result Value Ref Range    Sodium-POC 137 137 - 147 MMOL/L   POC IONIZED CALCIUM    Collection Time: 02/04/17  4:47 PM   Result Value Ref Range    Ionized Calcium-POC 1.01 1.0 - 1.3 MMOL/L   POC GLUCOSE    Collection Time: 02/04/17  5:36 PM   Result Value Ref Range    Glucose, POC 177 (H) 70 - 100 MG/DL   POC BLOOD GAS ARTERIAL    Collection Time: 02/04/17  5:39 PM   Result Value Ref Range    PH-ART-POC 7.40 7.35 - 7.45    PCO2-ART-POC 40 35 - 45 MMHG    PO2-ART-POC 147 (H) 80 - 100 MMHG    Base Ex-ART-POC 0.0 MMOL/L    O2 Sat-ART-POC 99.0 95 - 99 %    Bicarbonate-ART-POC 24.8 21 - 28 MMOL/L   POC HEMATOCRIT    Collection Time: 02/04/17  5:39 PM   Result Value Ref Range    Hemoglobin POC 8.8 (L) 12.0 - 15.0 GM/DL    Hematocrit POC 11.9 (L) 36 - 45 %   POC POTASSIUM    Collection Time: 02/04/17  5:39 PM   Result Value Ref Range    Potassium-POC 4.2 3.5 - 5.1 MMOL/L   POC SODIUM    Collection Time: 02/04/17  5:39 PM   Result Value Ref Range    Sodium-POC 139 137 - 147 MMOL/L   POC IONIZED CALCIUM    Collection Time: 02/04/17  5:39 PM   Result Value Ref Range    Ionized Calcium-POC 1.13 1.0 - 1.3 MMOL/L   POC GLUCOSE  Collection Time: 02/04/17  6:44 PM   Result Value Ref Range    Glucose, POC 150 (H) 70 - 100 MG/DL   CBC    Collection Time: 02/04/17  6:45 PM   Result Value Ref Range    White Blood Cells 14.1 (H) 4.5 - 11.0 K/UL    RBC 3.48 (L) 4.0 - 5.0 M/UL    Hemoglobin 10.6 (L) 12.0 - 15.0 GM/DL    Hematocrit 16.1 (L) 36 - 45 %    MCV 92.3 80 - 100 FL    MCH 30.4 26 - 34 PG    MCHC 32.9 32.0 - 36.0 G/DL    RDW 09.6 (H) 11 - 15 % Platelet Count 131 (L) 150 - 400 K/UL    MPV 7.8 7 - 11 FL   PTT (APTT)    Collection Time: 02/04/17  6:45 PM   Result Value Ref Range    APTT 26.8 20.0 - 36.0 SEC   BASIC METABOLIC PANEL    Collection Time: 02/04/17  6:45 PM   Result Value Ref Range    Sodium 137 137 - 147 MMOL/L    Potassium 4.3 3.5 - 5.1 MMOL/L    Chloride 107 98 - 110 MMOL/L    CO2 26 21 - 30 MMOL/L    Anion Gap 4 3 - 12    Glucose 154 (H) 70 - 100 MG/DL    Blood Urea Nitrogen 11 7 - 25 MG/DL    Creatinine 0.45 0.4 - 1.00 MG/DL    Calcium 8.2 (L) 8.5 - 10.6 MG/DL    eGFR Non African American 59 (L) >60 mL/min    eGFR African American >60 >60 mL/min   PROTIME INR (PT)    Collection Time: 02/04/17  6:45 PM   Result Value Ref Range    INR 1.3 (H) 0.8 - 1.2   MAGNESIUM    Collection Time: 02/04/17  6:45 PM   Result Value Ref Range    Magnesium 2.8 (H) 1.6 - 2.6 mg/dL   POC BLOOD GAS ARTERIAL    Collection Time: 02/04/17  6:51 PM   Result Value Ref Range    PH-ART-POC 7.32 (L) 7.35 - 7.45    PCO2-ART-POC 50 (H) 35 - 45 MMHG    PO2-ART-POC 186 (H) 80 - 100 MMHG    Base Def-ART-POC 1.0 MMOL/L    O2 Sat-ART-POC 100.0 (H) 95 - 99 %    Bicarbonate-ART-POC 25.6 21 - 28 MMOL/L   POC HEMATOCRIT    Collection Time: 02/04/17  6:51 PM   Result Value Ref Range    Hemoglobin POC 10.5 (L) 12.0 - 15.0 GM/DL    Hematocrit POC 40.9 (L) 36 - 45 %   POC POTASSIUM    Collection Time: 02/04/17  6:51 PM   Result Value Ref Range    Potassium-POC 4.1 3.5 - 5.1 MMOL/L   POC SODIUM    Collection Time: 02/04/17  6:51 PM   Result Value Ref Range    Sodium-POC 138 137 - 147 MMOL/L   POC IONIZED CALCIUM    Collection Time: 02/04/17  6:51 PM   Result Value Ref Range    Ionized Calcium-POC 1.20 1.0 - 1.3 MMOL/L   POC GLUCOSE    Collection Time: 02/04/17  7:06 PM   Result Value Ref Range    Glucose, POC 141 (H) 70 - 100 MG/DL   BLOOD GASES, ARTERIAL    Collection Time: 02/04/17  9:05 PM   Result Value Ref  Range    pH-Arterial 7.38 7.35 - 7.45    pCO2-Arterial 42 35 - 45 MMHG pO2-Arterial 91 80 - 100 MMHG    Base Deficit-Arterial 0.5 MMOL/L    O2 Sat-Arterial 97.7 95 - 99 %    Bicarbonate-ART-Cal 24.0 21 - 28 MMOL/L   POC GLUCOSE    Collection Time: 02/04/17  9:05 PM   Result Value Ref Range    Glucose, POC 114 (H) 70 - 100 MG/DL   MAGNESIUM    Collection Time: 02/04/17 10:00 PM   Result Value Ref Range    Magnesium 3.5 (H) 1.6 - 2.6 mg/dL   POC GLUCOSE    Collection Time: 02/04/17 10:02 PM   Result Value Ref Range    Glucose, POC 134 (H) 70 - 100 MG/DL   POC GLUCOSE    Collection Time: 02/04/17 11:02 PM   Result Value Ref Range    Glucose, POC 166 (H) 70 - 100 MG/DL   POC BLOOD GAS ARTERIAL    Collection Time: 02/04/17 11:18 PM   Result Value Ref Range    PH-ART-POC 7.33 (L) 7.35 - 7.45    PCO2-ART-POC 44 35 - 45 MMHG    PO2-ART-POC 91 80 - 100 MMHG    Base Def-ART-POC 3.0 MMOL/L    O2 Sat-ART-POC 96.0 95 - 99 %    Bicarbonate-ART-POC 23.1 21 - 28 MMOL/L   BLOOD GASES, ARTERIAL    Collection Time: 02/05/17 12:10 AM   Result Value Ref Range    pH-Arterial 7.30 (L) 7.35 - 7.45    pCO2-Arterial 49 (H) 35 - 45 MMHG    pO2-Arterial 149 (H) 80 - 100 MMHG    Base Deficit-Arterial 3.0 MMOL/L    O2 Sat-Arterial 98.9 95 - 99 %    Bicarbonate-ART-Cal 22.0 21 - 28 MMOL/L   POC GLUCOSE    Collection Time: 02/05/17 12:11 AM   Result Value Ref Range    Glucose, POC 183 (H) 70 - 100 MG/DL   POC GLUCOSE    Collection Time: 02/05/17  1:02 AM   Result Value Ref Range    Glucose, POC 159 (H) 70 - 100 MG/DL   POC GLUCOSE    Collection Time: 02/05/17  2:02 AM   Result Value Ref Range    Glucose, POC 172 (H) 70 - 100 MG/DL   POC GLUCOSE    Collection Time: 02/05/17  2:56 AM   Result Value Ref Range    Glucose, POC 177 (H) 70 - 100 MG/DL   POC GLUCOSE    Collection Time: 02/05/17  4:14 AM   Result Value Ref Range    Glucose, POC 183 (H) 70 - 100 MG/DL   LIPID PROFILE    Collection Time: 02/05/17  4:15 AM   Result Value Ref Range    Cholesterol 93 <200 MG/DL    Triglycerides 91 <161 MG/DL HDL 33 (L) >09 MG/DL    LDL 46 <604 MG/DL    VLDL 18 MG/DL    Non HDL Cholesterol 60 MG/DL   COMPREHENSIVE METABOLIC PANEL    Collection Time: 02/05/17  4:15 AM   Result Value Ref Range    Sodium 133 (L) 137 - 147 MMOL/L    Potassium 4.9 3.5 - 5.1 MMOL/L    Chloride 105 98 - 110 MMOL/L    Glucose 194 (H) 70 - 100 MG/DL    Blood Urea Nitrogen 15 7 - 25 MG/DL    Creatinine 5.40 (H) 0.4 - 1.00 MG/DL  Calcium 8.2 (L) 8.5 - 10.6 MG/DL    Total Protein 5.7 (L) 6.0 - 8.0 G/DL    Total Bilirubin 0.9 0.3 - 1.2 MG/DL    Albumin 3.2 (L) 3.5 - 5.0 G/DL    Alk Phosphatase 37 25 - 110 U/L    AST (SGOT) 54 (H) 7 - 40 U/L    CO2 23 21 - 30 MMOL/L    ALT (SGPT) 10 7 - 56 U/L    Anion Gap 5 3 - 12    eGFR Non African American 45 (L) >60 mL/min    eGFR African American 54 (L) >60 mL/min   MAGNESIUM    Collection Time: 02/05/17  4:15 AM   Result Value Ref Range    Magnesium 3.2 (H) 1.6 - 2.6 mg/dL   CBC    Collection Time: 02/05/17  4:15 AM   Result Value Ref Range    White Blood Cells 10.6 4.5 - 11.0 K/UL    RBC 3.33 (L) 4.0 - 5.0 M/UL    Hemoglobin 10.1 (L) 12.0 - 15.0 GM/DL    Hematocrit 14.7 (L) 36 - 45 %    MCV 91.9 80 - 100 FL    MCH 30.4 26 - 34 PG    MCHC 33.1 32.0 - 36.0 G/DL    RDW 82.9 (H) 11 - 15 %    Platelet Count 114 (L) 150 - 400 K/UL    MPV 8.4 7 - 11 FL   BLOOD GASES, ARTERIAL    Collection Time: 02/05/17  4:15 AM   Result Value Ref Range    pH-Arterial 7.28 (L) 7.35 - 7.45    pCO2-Arterial 47 (H) 35 - 45 MMHG    pO2-Arterial 98 80 - 100 MMHG    Base Deficit-Arterial 4.7 MMOL/L    O2 Sat-Arterial 97.4 95 - 99 %    Bicarbonate-ART-Cal 20.5 (L) 21 - 28 MMOL/L   POC GLUCOSE    Collection Time: 02/05/17  5:03 AM   Result Value Ref Range    Glucose, POC 165 (H) 70 - 100 MG/DL   POC GLUCOSE    Collection Time: 02/05/17  6:10 AM   Result Value Ref Range    Glucose, POC 147 (H) 70 - 100 MG/DL   AMMONIA    Collection Time: 02/05/17  7:15 AM   Result Value Ref Range    Ammonia 74 (H) 9 - 35 MCMOL/L   BLOOD GASES, ARTERIAL Collection Time: 02/05/17  9:37 AM   Result Value Ref Range    pH-Arterial 7.29 (L) 7.35 - 7.45    pCO2-Arterial 50 (H) 35 - 45 MMHG    pO2-Arterial 83 80 - 100 MMHG    Base Deficit-Arterial 3.0 MMOL/L    O2 Sat-Arterial 96.1 95 - 99 %    Bicarbonate-ART-Cal 21.9 21 - 28 MMOL/L   POC GLUCOSE    Collection Time: 02/05/17  9:37 AM   Result Value Ref Range    Glucose, POC 137 (H) 70 - 100 MG/DL   POC GLUCOSE    Collection Time: 02/05/17 12:12 PM   Result Value Ref Range    Glucose, POC 129 (H) 70 - 100 MG/DL           Diagnostic Studies    Cxray: Improving aeration of the with persistent atelectasis and consolidation of   the left lower lobe associated with a small left pleural effusion.    Persistent postoperative widening of the mediastinum.

## 2017-02-05 NOTE — Progress Notes
Cardiothoracic Surgery Critical Care   Progress Note     Diane Savage  Today's Date:  02/05/2017  Admission Date: 01/31/2017  LOS: 5 days    POD: 1    Procedure: CORONARY ARTERY BYPASS WITH ARTERIAL GRAFT - 4 GRAFTS (Internal Mammary Artery and Endovascular Vein Harvest): [id number] (CPT???)  MAZE PROCEDURE:       Active Problems:    NSTEMI (non-ST elevated myocardial infarction) (HCC)    Type 2 diabetes mellitus with circulatory disorder, without long-term current use of insulin (HCC)    PAF (paroxysmal atrial fibrillation) (HCC)    Atrial fibrillation with rapid ventricular response (HCC)    Chronic gastrointestinal bleeding    Transfusion-dependent anemia    CAD (coronary artery disease)    HLD (hyperlipidemia)    Hypothyroidism    Hepatitis B    Vasogenic shock (HCC)    Hyperammonemia (HCC)    Obstructive pattern present on pulmonary function testing    Respiratory acidosis    Acute blood loss anemia    Thrombocytopenia (HCC)    Junctional rhythm        Assessment/Plan:      Neuro ??? A little sleepy but oriented x4. Reports pain is well controled. Continue PRN Fentanyl, oxycodone with scheduled Tylenol.   CV ??? A-paced at 80 bpm, underlying rhythm junctional 50's. EP consulted. Pt reports feeling dizzy & nauseated when underlying rhythm is checked. HX of PAF. Was not on [facility] r/t GI bleeding. Ongoing vasogenic shock post-op. Continue to titrate levophed and vasopressin for MAP goal > 80. CI > 2, dc swan. Cont ASA 81mg  & rosuvastatin. Holding prophylactic amio for junctional rhythm. Hold pta metoprolol and pta enalapril while on pressors. Intra op TEE LVEF 60% with LVH.  Resp ??? Daily CXR ??? bedside interpretation: lungs expanded, mild pulmonary vascular congestion with bibasilar atelectasis. Having some respiratory acidosis post-op. Will place bipap and re-evaluate. FEV 1: 52%. Wean FiO2. Continue IS & aggressive pulm hygeine. Leave chest tubes in place r/t pacer dependency. Renal ??? Baseline Scr 1.05 - 1.81. sCr 0.97-->1.22-->1.33. Continue to monitor BMP/UOP, assess for AKI. No plans for diuresis today with ongoing pressor requirement.   GI - Continue post-op bowel regimen. Adat. HX of chronic GI bleeding. Capsule study shows GAVE. Pt to follow up with GI as outpatient for EGD and possible ablation. GI has signed off. Continue Protonix BID per GI recommendations.  Ammonia level was 70 prior to surgery --> 74 today. Continue to monitor LFT's.   ID ??? Continue standard post op antibiotic for prophylaxis, per CTS protocol. Afebrile, wbc 18-->15.9. Ecoli UTI - continue rocephin x 7 days.   Heme ??? Hgb 10.1, platelets 114. Continue to monitor. HX of chronic GI bleeding. She has had multiple transfusions this admissions, + antibodies.    FEN ??? If creatinine < 2.0, replace Mg and K per CTS post op protocol. Hgb A1c 7.1%.  Continue Insulin gtt per Modified [facility] Protocol.    Activity ??? Advance activity as tolerated. PT/OT.     Prophylaxis Review:  Lines:  Yes; Arterial Line; Indication:  Frequent blood draws and Continuous BP monitoring; Location:  Radial  Central Line; Indication:  Med not deliverable peripherally and Hemodynamic monitoring; Type:  Internal jugular  Antibiotic Usage:  UTI, rocephin  VTE:  Mechanical prophylaxis; Foot pump  Urinary Catheter: Yes; Retain foley due to:  Need for accurate Intake and Output    Disposition: The patient is post-cardiac surgery at at risk for life threatening deterioration. Patient  is critically ill s/p CABG x 4 with MAZE procedure.  Continue ICU care.     I have seen, personally fully evaluated, and discussed patient with the critical care attending and cardiothoracic surgeon. The patient is critically ill, I spent 70 minutes (excluding time spent performing procedures) providing and personally directing critical care services including direct OR recovery, ventilator management, hemodynamic monitoring and management, lab and radiology review, medication review and management, fluid and electrolyte management and coordination of care.     Bland Span, APRN-NP   CTS Intensive Care  Pager [phone]  02/05/2017    Subjective:       HPI:   Diane Savage is a 61 y.o. female with PMH significant for pA-fib, HTN, HLD, NIDDM, Chronic blood anemia, transfusion dependent anemia, CAD, hypothyroidism who presented to OSH with complains of chest pain. Patient states that she has had chest pain for months. Troponin was elevated at 0.3.  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 She also developed A fib with RVR at OSH. Her Hgb at the OSH was 6.3 on arrival. Patient was transferred to [facility] for further care.  Anemia has been presents for years and has had an extensive GI work up including EGD, capsule and CE which were all negative. She was taken off of anticoagulation given her chronic anemia.  Cath revealed 80% LMCA stenosis and high-grade RCA lesion as well.  She presents today to the ICU S/P CABG x 4 with MAZE procedure.        REVIEW OF SYSTEMS:   Review of systems not obtained from patient due to patient factors.      Objective:        Medications:  Scheduled Meds:    acetaminophen (OFIRMEV) 1,000 mg injection 100 mL 1,000 mg Intravenous ONCE   acetaminophen (TYLENOL) tablet 1,000 mg 1,000 mg Oral Q6H*   aspirin chewable tablet 81 mg 81 mg Oral QDAY   cefTRIAXone (ROCEPHIN) IVP 1 g 1 g Intravenous Q24H*   levothyroxine (SYNTHROID) tablet 175 mcg 175 mcg Oral QDAY(07)   lidocaine (LIDODERM) 5 % topical patch 1-2 patch 1-2 patch Topical QDAY   metoclopramide (REGLAN) injection 10 mg 10 mg Intravenous Q6H*   pantoprazole (PROTONIX) injection 80 mg 80 mg Intravenous BID(11-21)   polyethylene glycol 3350 (MIRALAX) packet 17 g 1 packet Oral QDAY   promethazine (PHENERGAN) injection 6.25 mg 6.25 mg Intravenous ONCE   rosuvastatin (CRESTOR) tablet 40 mg 40 mg Oral QDAY senna/docusate (SENOKOT-S) tablet 2 tablet 2 tablet Oral BID   Continuous Infusions:  ??? insulin regular (NOVOLIN R) 100 Units in sodium chloride 0.9% (NS) 100 mL IV drip (std conc) 5 Units/hr (02/05/17 0710)   ??? nitroGLYCERIN 50 mg/D5W 250 mL infusion     ??? norepinephrine (LEVOPHED) 4 mg in sodium chloride 0.9% (NS) 250 mL IV drip (std conc) 0.04 mcg/kg/min (02/05/17 1040)   ??? sodium chloride 0.9 %   infusion 30 mL/hr at 02/05/17 0646   ??? vasopressin (VASOSTRICT) 20 Units in sodium chloride 0.9% (NS) 100 mL IV drip (std conc) 3 Units/hr (02/05/17 1023)     PRN and Respiratory Meds:[START ON 02/09/2017] acetaminophen Q6H PRN **OR** [START ON 02/09/2017] acetaminophen Q6H PRN, alum/mag hydroxide/simeth Q4H PRN, bisacodyl QDAY PRN, fentaNYL citrate PF Q1H PRN, hydrALAZINE Q6H PRN, lidocaine PF PRN, milk of magnesia (CONC) QDAY PRN, ondansetron Q6H PRN **OR** ondansetron (ZOFRAN) IV Q6H PRN, oxyCODONE Q4H PRN, potassium chloride SR PRN **OR** potassium chloride PRN **OR** potassium chloride in water PRN  Vital Signs: Last Filed                  Vital Signs: 24 Hour Range   BP: 133/55 (09/04 1200)  Temp: 35.3 ???C (95.5 ???F) (09/05 1100)  Pulse: 74 (09/05 1100)  Respirations: 21 PER MINUTE (09/05 1100)  SpO2: 96 % (09/05 1100)  O2 Delivery: Nasal Cannula (09/05 0800)  SpO2 Pulse: 85 (09/05 1100) BP: (133-135)/(48-55)   ABP: (85-132)/(41-61)   Temp:  [34.9 ???C (94.8 ???F)-36.8 ???C (98.2 ???F)]   Pulse:  [58-87]   Respirations:  [10 PER MINUTE-53 PER MINUTE]   SpO2:  [94 %-100 %]   O2 Delivery: Nasal Cannula   Intensity Pain Scale 0-10 (Pain 1): 8 (02/05/17 0600) Vitals:    02/04/17 0400 02/04/17 1130 02/05/17 0600   Weight: 113.5 kg (250 lb 3.2 oz) 113.5 kg (250 lb 3.2 oz) 117.3 kg (258 lb 9.6 oz)           Intake/Output Summary:  (Last 24 hours)    Intake/Output Summary (Last 24 hours) at 02/05/17 1142  Last data filed at 02/05/17 1100   Gross per 24 hour   Intake          3949.26 ml Output             1705 ml   Net          2244.26 ml         Physical Exam:         Neuro: Sedated, MAE   Cardiovascular: RRR no rub or murmur, A-paced at 80 bpm  Respiratory: LS CTA bil - diminished in the bases  GI: soft, NT, hypoactive BS  Extremities: trace ankle edema Edema  Incisions: Sternal incision dressing, dry and intact. No crepitus or sternal instability.      Chest Tubes OUTPUT on arrival  AIR LEAK PRESENT   Mediastinal 250 mL no   Mediastinal     Pleural  95 mL no    Pleural        Artificial airway:                                                                                      Vent weaning trial:    Laboratory:  LABS:  Recent Labs      02/03/17   0428  02/04/17   0450  02/04/17   1845  02/04/17   2200  02/05/17   0415   NA  136*  132*  137   --   133*   K  3.8  4.0  4.3   --   4.9   CL  102  101  107   --   105   CO2  29  26  26    --   23   GAP  5  5  4    --   5   BUN  15  12  11    --   15   CR  1.20*  1.05*  0.97   --   1.22*   GLU  255*  221*  154*   --   194*  CA  8.3*  8.5  8.2*   --   8.2*   ALBUMIN  2.9*  3.2*   --    --   3.2*   MG  2.0  2.1  2.8*  3.5*  3.2*       Recent Labs      02/02/17   1330  02/02/17   2050  02/03/17   0428  02/03/17   1136  02/03/17   1304  02/03/17   1720  02/03/17   2120  02/03/17   2355  02/04/17   0450  02/04/17   0645  02/04/17   1845  02/05/17   0415   WBC  5.4  4.5  4.5   --   5.6   --   4.5   --   4.9   --   14.1*  10.6   HGB  8.1*  8.0*  7.8*   --   9.5*   --   8.7*   --   9.0*   --   10.6*  10.1*   HCT  24.4*  24.2*  24.0*   --   28.7*   --   26.3*   --   27.0*   --   32.1*  30.6*   PLTCT  137*  131*  128*   --   147*   --   125*   --   126*   --   131*  114*   INR   --    --    --    --    --    --    --    --    --    --   1.3*   --    PTT  35.8  41.4*  56.9*  61.0*   --   60.4*   --   59.9*   --   62.3*  26.8   --    AST   --    --   19   --    --    --    --    --   21   --    --   54* ALT   --    --   3*   --    --    --    --    --   8   --    --   10   ALKPHOS   --    --   47   --    --    --    --    --   49   --    --   37      Estimated Creatinine Clearance: 61.7 mL/min (A) (based on SCr of 1.22 mg/dL (H)).  Vitals:    02/04/17 0400 02/04/17 1130 02/05/17 0600   Weight: 113.5 kg (250 lb 3.2 oz) 113.5 kg (250 lb 3.2 oz) 117.3 kg (258 lb 9.6 oz)      Recent Labs      02/05/17   0415  02/05/17   0937   PHART  7.28*  7.29*   PO2ART  98  83           Radiology and Other Diagnostic Procedures Review:    Reviewed

## 2017-02-05 NOTE — Progress Notes
0400 dangle at bedside, stand and step to head of bed. Tolerated without difficulty.  0500 ABG resulted, BiPAP settings changed.    VSS. Remains hypothermic. Pt states she is comfortable. Room temp increased, fan not on patient, blankets on. Will continue to monitor.    UOP borderline.    MInimal CT output.    Gtts:  NE 0.04 mcg  Vaso 3 units  Insulin per Modified [facility]  NS

## 2017-02-05 NOTE — Consults
CTS-ICU Consult Note          Diane Savage  Admission Date: 01/31/2017  LOS: 4 days                     ASSESSMENT/PLAN     ATTESTATION    Date of Service: 02/04/2017    I have seen, personally fully evaluated, and discussed patient with the CTS ICU team.  The patient is critically ill s/p CABG/MAZE.  I spent 45 minutes (excluding time spent performing or supervising any procedures) providing and personally directing critical care services including direct OR recovery, ventilator management, hemodynamic monitoring and management, lab and radiology review, medication review and management, fluid and electrolyte management and coordination of care.    Staff name:  Windell Moment, MD Date:  02/04/2017       Patient Active Problem List    Diagnosis Date Noted   ??? CAD (coronary artery disease) 02/04/2017   ??? HLD (hyperlipidemia) 02/04/2017   ??? Hypothyroidism 02/04/2017   ??? 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 Vasogenic shock (HCC) 02/04/2017   ??? Hyperammonemia (HCC) 02/04/2017   ??? 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 Respiratory acidosis 02/04/2017   ??? Acute blood loss anemia 02/04/2017   ??? Thrombocytopenia (HCC) 02/04/2017   ??? NSTEMI (non-ST elevated myocardial infarction) (HCC) 01/31/2017   ??? Type 2 diabetes mellitus with circulatory disorder, without long-term current use of insulin (HCC) 01/31/2017   ??? PAF (paroxysmal atrial fibrillation) (HCC) 01/31/2017   ??? Atrial fibrillation with rapid ventricular response (HCC) 01/31/2017   ??? Chronic gastrointestinal bleeding 01/31/2017   ??? 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 Absolute anemia 11/01/2016     Added automatically from request for surgery [id number]     ??? Anemia 09/16/2016        Neuro: Scheduled tylenol x 5 days and PRN opioids per CTS protocol.  Assess daily for delirium.     Sedation:  Precedex PRN.  Cardiac: Monitor vitals q1hr.  PA measurements q1hr x 4 then q4 hrs and as needed.  CI goal > 2.0, fluids/inotropes PRN. Respiratory:  Remains intubated immediately postop (planned) until hemodynamic stability, bleeding, mental status, labs, radiology all acceptable.  Check postop ABG, wean to extubate per protocol.  May be slow to wen given obesity and baseline lung function.  OOB when able, aggressive RT.  Chest tubes per CTS.    GI: NPO until extubated then advance diet as tolerated.  GI ppx:  PPI.  Recent capsule endoscopy showing GAVE.  Monitor for GI bleeding.  Start CTS bowel regimen, ensure regular BM.    Heme:  Continue amicar started in OR. Check postop CBC/coags, assess for coagulopathy.  Hold chemical DVT ppx until cleared by CTS, continue mechanical ppx.  Start ASA 325 mgs within 8 hours of surgery (hold for plts < 80K).    ID: Post op abx:  Cefazolin  Per CTS protocol - ancef 1 or 2 gms (if pt <80 kgs or >= 80kgs respectively) Q8hrs x 3 doses.    Renal:  Check postop BMP, monitor for AKI due to recent AKI .  Fluids PRN to maintain hemodynamic stability.    FEN: If creatinine < 2.0, replace Mg per CTS postop protocol (4gms IV over 4 hours), recheck Mg level after infusion completed.  Electrolyte goals while in ICU:  Mg >2.0, iCal > 1.0, K+ >4.0 mEq/L.  Insulin gtt per Modified [facility] Protocol x 48 hours postop.  Activity:  Increase HOB to 30 degrees over 1st postop hour.  Bedrest until extubated.  Dangle early and advance activity as tolerated.  Early cardiac PT/OT.    Dispo:  This patient is immediately postop, critically ill with dysfunction of multiple organ systems and is at risk for additional life threatening deterioration.  Cont ICU care.      Lines:  Yes; Arterial Line; Indication:  Frequent blood draws; Location:  Radial  Central Line; Indication:  Med not deliverable peripherally and Hemodynamic monitoring; Type:  Internal jugular  Urinary Catheter:  Yes; Retain foley due to:  Need for accurate Intake and Output    __________________________________________________________________________________  HISTORY OR Course  Uncomplicated intubation, See OR record for exogenous blood products.  Complications: none apparent      PMHx significant for: CAD, obesity, COPD, anemia, DM II, paroxysmal a-fib, chronic GI bleeding, anemia, Hep B      Past Surgical History:   Procedure Laterality Date   ??? HX HEART CATHETERIZATION  2017   ??? CORONARY STENT PLACEMENT  2017   ??? ANKLE SURGERY      Left ankle reconstruction   ??? COLONOSCOPY     ??? HX KNEE ARTHROSCOPY Left    ??? HX TONSIL AND ADENOIDECTOMY     ??? TUBAL LIGATION     ??? UPPER GASTROINTESTINAL ENDOSCOPY              Allergies   Allergen Reactions   ??? Diltiazem RASH, NAUSEA AND VOMITING and SEE COMMENTS     Sore mouth   ??? Sulfa (Sulfonamide Antibiotics) RASH   ??? Duricef [Cefadroxil] NAUSEA AND VOMITING   ??? Pravastatin NAUSEA AND VOMITING   ??? Simvastatin NAUSEA AND VOMITING          Home Medications  Inpatient Scheduled Meds:  acetaminophen (TYLENOL) tablet 1,000 mg 1,000 mg Oral Q6H*   albuterol 0.083% (PROVENTIL; VENTOLIN) nebulizer solution 2.5 mg 2.5 mg Inhalation Q4H & PRN   aspirin chewable tablet 81 mg 81 mg Oral QDAY   [START ON 02/05/2017] ceFAZolin (ANCEF) IVP 2 g 2 g Intravenous Q8H*   cephalexin (KEFLEX) capsule 500 mg 500 mg Oral QID   ipratropium bromide (ATROVENT) 0.02 % nebulizer solution 0.5 mg 0.5 mg Inhalation Q4H & PRN   levothyroxine (SYNTHROID) tablet 175 mcg 175 mcg Oral QDAY(07)   magnesium sulfate   4 g/50 mL IVPB 4 g Intravenous ONCE   pantoprazole (PROTONIX) injection 80 mg 80 mg Intravenous BID(11-21)   rosuvastatin (CRESTOR) tablet 40 mg 40 mg Oral QDAY   [START ON 02/05/2017] senna/docusate (SENOKOT-S) tablet 2 tablet 2 tablet Oral BID   Continuous Infusions:  ??? aminocaproic acid (AMICAR) 12.5 g in sodium chloride 0.9% (NS) IV infusion 2 g/hr (02/04/17 1838)   ??? dexmedetomidine (PRECEDEX) 400 mcg in sodium chloride 0.9% (NS) 100 mL IV infusion 1 mcg/kg/hr (02/04/17 1840)   ??? insulin regular (NOVOLIN R) 100 Units in sodium chloride 0.9% (NS) 100 mL IV drip (std conc) 5 Units/hr (02/04/17 1913)   ??? nitroGLYCERIN 50 mg/D5W 250 mL infusion     ??? norepinephrine (LEVOPHED) 4 mg in dextrose 5% (D5W) 250 mL IV drip (std conc) 0.03 mcg/kg/min (02/04/17 1852)   ??? sodium chloride 0.9 %   infusion 30 mL/hr at 02/04/17 1838   ??? vasopressin (VASOSTRICT) 20 Units in sodium chloride 0.9% (NS) 100 mL IV drip (std conc) 2.4 Units/hr (02/04/17 1830)     PRN and Respiratory Meds:[START ON 02/09/2017] acetaminophen Q6H PRN **OR** [START ON 02/09/2017] acetaminophen  Q6H PRN, alum/mag hydroxide/simeth Q4H PRN, atropine Once PRN, bisacodyl QDAY PRN, fentaNYL citrate PF Q1H PRN, hydrALAZINE Q6H PRN, lidocaine PF PRN, milk of magnesia (CONC) QDAY PRN, ondansetron Q6H PRN **OR** ondansetron (ZOFRAN) IV Q6H PRN, oxyCODONE Q4H PRN, potassium chloride SR PRN **OR** potassium chloride PRN **OR** potassium chloride in water PRN    Social History  Social History   Substance Use Topics   ??? Smoking status: Former Smoker     Packs/day: 2.00     Years: 10.00     Types: Cigarettes     Quit date: 09/16/1980   ??? Smokeless tobacco: Never Used   ??? Alcohol use Yes      Comment: Seldom          OBJECTIVE                       Vital Signs: Last Filed                  Vital Signs: 24 Hour Range   BP: 133/55 (09/04 1200)  ABP: 109/49 (09/04 1908)  Temp: 36.4 ???C (97.5 ???F) (09/04 1908)  Pulse: 80 (09/04 1908)  Respirations: 18 PER MINUTE (09/04 1908)  SpO2: 99 % (09/04 1908)  O2 Delivery: Endotracheal Tube (Oral) (09/04 1900)  Height: 162.6 cm (64.02) (09/04 1130)  Weight: 113.5 kg (250 lb 3.2 oz) (09/04 1130)  Dosing / Dry Weight: 113.5 kg (250 lb 3.2 oz) (09/04 1130)  BP: (123-159)/(35-62)   ABP: (85-132)/(41-61)   Temp:  [36.4 ???C (97.5 ???F)-37.1 ???C (98.8 ???F)]   Pulse:  [66-84]   Respirations:  [11 PER MINUTE-26 PER MINUTE]   SpO2:  [74 %-100 %]   O2 Delivery: Endotracheal Tube (Oral)    Intensity Pain Scale 0-10 (Pain 1): (not recorded) Vitals:    02/03/17 0630 02/04/17 0400 02/04/17 1130 Weight: 113 kg (249 lb 1.9 oz) 113.5 kg (250 lb 3.2 oz) 113.5 kg (250 lb 3.2 oz)           Artificial airway:  Endotracheal Tube              Ventilator/ Respiratory Therapy:  Yes: SAT Safety Screen Exempt (Nursing Only): Exempt-Other (Comment)  Spontaneous Awakening Trial (Nursing only): Fail  Mode: V/AC+  Set Vt (ml):  [440 milliliters]   Expired Tidal Volume Spont (mL):  [434 milliliters-461 milliliters]   Set RR:  [15 breaths/minutes-17 breaths/minutes]   Total Respiratory Rate (Breaths/Min):  [15 breaths/minutes-17 breaths/minutes]   Minute Volume (L/min):  [6.83 liters/minutes-7.5 liters/minutes]   O2%:  [60 %-100 %]   PIP Actual:  [24 cm H20-29 cm H20]   PEEP/CPAP:  [5 cm H2O]      Physical Exam:    General: well-developed, well-nourished, sedated, no distress and morbidly obese  Neurologic:  Pupils: Size:  3 mm, equal,  Reactivity: brisk  Heart: regular rate and rhythm, S1, S2 normal, no murmur, click, rub or gallop  Lungs: clear to auscultation bilaterally                      Abdomen: soft, non-tender. Bowel sounds normal. No masses,  no organomegaly  Extremities: extremities normal, atraumatic, no cyanosis or edema    Lab Review:  Pertinent labs reviewed      Point of Care Testing:  (Last 24 hours):  Glucose: (!) 154 (02/04/17 1845)  POC Glucose (Download): (!) 141 (02/04/17 1906)      Radiology and Other Diagnostic Procedures Review:  CXR and TEE reviewed        Windell Moment, MD  Assistant Professor,   Anesthesiology and Critical Care  [phone]

## 2017-02-06 ENCOUNTER — Encounter: Admit: 2017-02-06 | Discharge: 2017-02-06 | Payer: MEDICARE

## 2017-02-06 DIAGNOSIS — I214 Non-ST elevation (NSTEMI) myocardial infarction: ICD-10-CM

## 2017-02-06 DIAGNOSIS — K319 Disease of stomach and duodenum, unspecified: ICD-10-CM

## 2017-02-06 DIAGNOSIS — H547 Unspecified visual loss: ICD-10-CM

## 2017-02-06 DIAGNOSIS — E039 Hypothyroidism, unspecified: ICD-10-CM

## 2017-02-06 DIAGNOSIS — D699 Hemorrhagic condition, unspecified: ICD-10-CM

## 2017-02-06 DIAGNOSIS — E119 Type 2 diabetes mellitus without complications: ICD-10-CM

## 2017-02-06 DIAGNOSIS — I10 Essential (primary) hypertension: ICD-10-CM

## 2017-02-06 DIAGNOSIS — M549 Dorsalgia, unspecified: ICD-10-CM

## 2017-02-06 DIAGNOSIS — M1990 Unspecified osteoarthritis, unspecified site: ICD-10-CM

## 2017-02-06 DIAGNOSIS — I2510 Atherosclerotic heart disease of native coronary artery without angina pectoris: Principal | ICD-10-CM

## 2017-02-06 LAB — POC GLUCOSE
Lab: 116 mg/dL — ABNORMAL HIGH (ref 70–100)
Lab: 110 mg/dL — ABNORMAL HIGH (ref 70–100)
Lab: 117 mg/dL — ABNORMAL HIGH (ref 70–100)
Lab: 129 mg/dL — ABNORMAL HIGH (ref 70–100)
Lab: 135 mg/dL — ABNORMAL HIGH (ref 70–100)
Lab: 162 mg/dL — ABNORMAL HIGH (ref 70–100)
Lab: 141 mg/dL — ABNORMAL HIGH (ref 70–100)
Lab: 138 mg/dL — ABNORMAL HIGH (ref 70–100)
Lab: 132 mg/dL — ABNORMAL HIGH (ref 70–100)
Lab: 130 mg/dL — ABNORMAL HIGH (ref 70–100)
Lab: 135 mg/dL — ABNORMAL HIGH (ref 70–100)
Lab: 166 mg/dL — ABNORMAL HIGH (ref 70–100)
Lab: 152 mg/dL — ABNORMAL HIGH (ref 70–100)
Lab: 156 mg/dL — ABNORMAL HIGH (ref 70–100)
Lab: 156 mg/dL — ABNORMAL HIGH (ref 70–100)
Lab: 142 mg/dL — ABNORMAL HIGH (ref 70–100)

## 2017-02-06 LAB — POC BLOOD GAS ARTERIAL
Lab: 23 MMOL/L (ref 21–28)
Lab: 5 MMOL/L
Lab: 59 mmHg — ABNORMAL HIGH (ref 35–45)
Lab: 7.2 — ABNORMAL LOW (ref 7.35–7.45)
Lab: 2 MMOL/L
Lab: 25 MMOL/L (ref 21–28)
Lab: 55 mmHg — ABNORMAL HIGH (ref 35–45)
Lab: 7.2 — ABNORMAL LOW (ref 7.35–7.45)
Lab: 96 % (ref 95–99)

## 2017-02-06 LAB — BLOOD GASES, ARTERIAL
Lab: 122 mmHg — ABNORMAL HIGH (ref 80–100)
Lab: 19 MMOL/L — ABNORMAL LOW (ref 21–28)
Lab: 6.4 MMOL/L
Lab: 65 mmHg — ABNORMAL HIGH (ref 35–45)
Lab: 98 % (ref 95–99)
Lab: 7.1 — CL (ref 7.35–7.45)
Lab: 20 MMOL/L — ABNORMAL LOW (ref 21–28)
Lab: 4.9 MMOL/L
Lab: 5.1 MMOL/L (ref 8.5–10.6)
Lab: 50 mmHg — ABNORMAL HIGH (ref 35–45)
Lab: 54 mmHg — ABNORMAL HIGH (ref 35–45)
Lab: 7.2 mg/dL — ABNORMAL LOW (ref 7.35–7.45)
Lab: 7.2 — ABNORMAL LOW (ref 7.35–7.45)
Lab: 75 mmHg — ABNORMAL LOW (ref 80–100)
Lab: 84 mmHg — ABNORMAL HIGH (ref 80–100)
Lab: 95 % (ref 95–99)
Lab: 96 % (ref 95–99)
Lab: 20 MMOL/L — ABNORMAL LOW (ref 21–28)
Lab: 4.5 MMOL/L
Lab: 50 mmHg — ABNORMAL HIGH (ref 35–45)
Lab: 7.2 — ABNORMAL LOW (ref 7.35–7.45)
Lab: 91 mmHg (ref 80–100)
Lab: 97 % (ref 95–99)
Lab: 19 MMOL/L — ABNORMAL LOW (ref 21–28)
Lab: 47 mmHg — ABNORMAL HIGH (ref 35–45)
Lab: 5.8 MMOL/L (ref 150–400)
Lab: 57 mmHg — ABNORMAL LOW (ref 80–100)
Lab: 7.2 pg — ABNORMAL LOW (ref 7.35–7.45)
Lab: 89 % — ABNORMAL LOW (ref 95–99)
Lab: 113 mmHg — ABNORMAL HIGH (ref 80–100)
Lab: 20 MMOL/L — ABNORMAL LOW (ref 21–28)
Lab: 48 mmHg — ABNORMAL HIGH (ref 35–45)
Lab: 5 MMOL/L
Lab: 98 % (ref 95–99)
Lab: 7.2 — ABNORMAL LOW (ref 7.35–7.45)

## 2017-02-06 LAB — ZINC WHOLE BLOOD: Lab: 335 g/dL — ABNORMAL LOW (ref >60)

## 2017-02-06 LAB — CBC
Lab: 18 10*3/uL — ABNORMAL HIGH (ref 4.5–11.0)
Lab: 30 % — ABNORMAL LOW (ref 36–45)

## 2017-02-06 LAB — COMPREHENSIVE METABOLIC PANEL: Lab: 133 MMOL/L — ABNORMAL LOW (ref <100)

## 2017-02-06 LAB — ANTI-PARIETAL CELL ANTIBODY: Lab: <10

## 2017-02-06 LAB — BASIC METABOLIC PANEL
Lab: 133 MMOL/L — ABNORMAL LOW (ref 137–147)
Lab: 4.5 MMOL/L (ref 3.5–5.1)
Lab: 8 (ref 3–12)

## 2017-02-06 LAB — LACTIC ACID(LACTATE): Lab: 0.8 MMOL/L (ref 0.5–2.0)

## 2017-02-06 LAB — BLOOD GASES, CENTRAL VENOUS
Lab: 19 MMOL/L
Lab: 5.3 MMOL/L
Lab: 60 mmHg (ref >40)
Lab: 62 mmHg — ABNORMAL HIGH (ref 40–50)
Lab: 7.2 — ABNORMAL LOW (ref 7.30–7.40)
Lab: 89 % — ABNORMAL HIGH (ref 65–75)

## 2017-02-06 LAB — LIPID PROFILE: Lab: 105 mg/dL — ABNORMAL LOW (ref <200)

## 2017-02-06 LAB — MAGNESIUM: Lab: 3.1 mg/dL — ABNORMAL HIGH (ref >60)

## 2017-02-06 MED ORDER — LACTATED RINGERS IV SOLP
250 mL | INTRAVENOUS | 0 refills | Status: CP
Start: 2017-02-06 — End: ?
  Administered 2017-02-06: 08:00:00 250 mL via INTRAVENOUS

## 2017-02-06 MED ORDER — METOCLOPRAMIDE HCL 5 MG/ML IJ SOLN
10 mg | Freq: Once | INTRAVENOUS | 0 refills | Status: CP
Start: 2017-02-06 — End: ?
  Administered 2017-02-06: 17:00:00 10 mg via INTRAVENOUS

## 2017-02-06 MED ORDER — SCOPOLAMINE BASE 1 MG OVER 3 DAYS TD PT3D
1 | TRANSDERMAL | 0 refills | Status: DC
Start: 2017-02-06 — End: 2017-02-09
  Administered 2017-02-06: 18:00:00 1 via TRANSDERMAL

## 2017-02-06 MED ORDER — METHOCARBAMOL IVPB
1000 mg | Freq: Once | INTRAVENOUS | 0 refills | Status: CP
Start: 2017-02-06 — End: ?
  Administered 2017-02-07 (×2): 1000 mg via INTRAVENOUS

## 2017-02-06 MED ORDER — FUROSEMIDE 10 MG/ML IJ SOLN
20 mg | Freq: Once | INTRAVENOUS | 0 refills | Status: CP
Start: 2017-02-06 — End: ?
  Administered 2017-02-06: 10:00:00 20 mg via INTRAVENOUS

## 2017-02-06 MED ORDER — FUROSEMIDE 10 MG/ML IJ SOLN
60 mg | Freq: Once | INTRAVENOUS | 0 refills | Status: CP
Start: 2017-02-06 — End: ?
  Administered 2017-02-06: 21:00:00 60 mg via INTRAVENOUS

## 2017-02-06 MED ORDER — DOPAMINE IN 5 % DEXTROSE 800 MG/250 ML (3,200 MCG/ML) IV SOLN
1-20 ug/kg/min | INTRAVENOUS | 0 refills | Status: DC
Start: 2017-02-06 — End: 2017-02-08
  Administered 2017-02-06: 13:00:00 2 ug/kg/min via INTRAVENOUS

## 2017-02-06 MED ORDER — ACETAMINOPHEN 1,000 MG/100 ML (10 MG/ML) IV SOLN
1000 mg | Freq: Once | INTRAVENOUS | 0 refills | Status: CP
Start: 2017-02-06 — End: ?
  Administered 2017-02-06: 18:00:00 1000 mg via INTRAVENOUS

## 2017-02-06 MED ORDER — PANTOPRAZOLE 40 MG IV SOLR
40 mg | Freq: Two times a day (BID) | INTRAVENOUS | 0 refills | Status: DC
Start: 2017-02-06 — End: 2017-02-13
  Administered 2017-02-06 – 2017-02-13 (×15): 40 mg via INTRAVENOUS

## 2017-02-06 MED ORDER — METHOCARBAMOL IVPB
1000 mg | Freq: Once | INTRAVENOUS | 0 refills | Status: CP
Start: 2017-02-06 — End: ?
  Administered 2017-02-06 (×2): 1000 mg via INTRAVENOUS

## 2017-02-06 NOTE — Progress Notes
Pt is awake, slightly drowsy, oriented, MAE, PERRL, afebrile, c/o 7/10 pain, team aware, will give IV tylenol today. Lungs CTA BL, diminished bases, Bipap 15/5. SR rate 80s on bedside monitor, MAP 80, pulses palpable, trace edema. Abdomen soft, active bowel sounds. Foley with marginal UO ~15-40 ml/hr. 2 Med CT ~0-20 ml/hr. Pleural ~0-10 ml/hr SS drainage. Wound vac to midsternal incision ~0 output to -125 suction.      Current gtts: Levo 0.04, Vaso 3, Dopamine 2, Insulin 3    Plan to mobilize to chair after CT pull and Aline placement. Will wean bipap as tolerated

## 2017-02-06 NOTE — Progress Notes
Critical Care Progress Note          Today's Date:  02/05/2017  Name:  Diane Savage                       MRN:  [id number]   Admission Date: 01/31/2017  LOS: 5 days                     Assessment/Plan:   Active Problems:    NSTEMI (non-ST elevated myocardial infarction) (HCC)    Type 2 diabetes mellitus with circulatory disorder, without long-term current use of insulin (HCC)    PAF (paroxysmal atrial fibrillation) (HCC)    Atrial fibrillation with rapid ventricular response (HCC)    Chronic gastrointestinal bleeding    Transfusion-dependent anemia    CAD (coronary artery disease)    HLD (hyperlipidemia)    Hypothyroidism    Hepatitis B    Vasogenic shock (HCC)    Hyperammonemia (HCC)    Obstructive pattern present on pulmonary function testing    Respiratory acidosis    Acute blood loss anemia    Thrombocytopenia (HCC)    Junctional rhythm      ATTESTATION    **This note is associated with the CTS ICU team note dated today.**    Date of Service: 02/05/2017    I have seen, personally fully evaluated, and discussed patient with the CTS ICU team.  The patient is critically ill s/p CABG.  I spent 50 minutes (excluding time spent performing or supervising any procedures) providing and personally directing critical care services including respiratory care, pain mgt, hemodynamic monitoring and management, lab and radiology review, medication review and management, fluid and electrolyte management and coordination of care.    Neurologically intact.  Difficult pain control.  Will increase analgesics as able.      CO/CI good.  D/c PAC.  Remains vasoplegic.  Cont levo/vaso.  Wean as able.  Now in SR.      Oxygenation adequate.  Some CO2 retention.  Likely due to post-op state and obesity.  Refusing to consistently wean BiPAP.  Use as able.  Monitor closely.  Aggressive pulm hygiene.    Creat 1.3.  Up slightly from baseline but improved from recently.  Keep BP up.  UOP adequate.  No diuretics yet. H/H okay.  No signs of bleeding.  SQ heparin when okay with CTS.    Persistent nausea.  Chronic problem for her.  Treating as able.      Cont peri-op abx.  Ceftriaxone for UTI.    This patient is critically ill with dysfunction of multiple organ systems and is at risk for additional life threatening deterioration.  Cont ICU care.    Staff name:  Windell Moment, MD Date:  02/05/2017       __________________________________________________________________________________  Subjective:  Diane Savage is a 61 y.o. female.  Overnight Events: Extubated overnight.  Otherwise stable.  Objective:  Medications:  Scheduled Meds:  acetaminophen (TYLENOL) tablet 1,000 mg 1,000 mg Oral Q6H*   albumin 25% injection 25 g 25 g Intravenous Q6H   aspirin chewable tablet 81 mg 81 mg Oral QDAY   cefTRIAXone (ROCEPHIN) IVP 1 g 1 g Intravenous Q24H*   levothyroxine (SYNTHROID) tablet 175 mcg 175 mcg Oral QDAY(07)   lidocaine (LIDODERM) 5 % topical patch 1-2 patch 1-2 patch Topical QDAY   pantoprazole (PROTONIX) injection 80 mg 80 mg Intravenous BID(11-21)   polyethylene glycol 3350 (MIRALAX) packet 17 g 1 packet Oral  QDAY   rosuvastatin (CRESTOR) tablet 40 mg 40 mg Oral QDAY   senna/docusate (SENOKOT-S) tablet 2 tablet 2 tablet Oral BID   Continuous Infusions:  ??? HYDROmorphone (DILAUDID) PCA   11 mg/NS 55mL infusion syr (std conc)(premade)     ??? insulin regular (NOVOLIN R) 100 Units in sodium chloride 0.9% (NS) 100 mL IV drip (std conc) 3 Units/hr (02/05/17 1927)   ??? nitroGLYCERIN 50 mg/D5W 250 mL infusion     ??? norepinephrine (LEVOPHED) 4 mg in sodium chloride 0.9% (NS) 250 mL IV drip (std conc) 0.03 mcg/kg/min (02/05/17 1930)   ??? sodium chloride 0.9 %   infusion 30 mL/hr at 02/05/17 0646   ??? vasopressin (VASOSTRICT) 20 Units in sodium chloride 0.9% (NS) 100 mL IV drip (std conc) 3 Units/hr (02/05/17 2045)     PRN and Respiratory Meds:[START ON 02/09/2017] acetaminophen Q6H PRN **OR** [START ON 02/09/2017] acetaminophen Q6H PRN, alum/mag hydroxide/simeth Q4H PRN, bisacodyl QDAY PRN, hydrALAZINE Q6H PRN, lidocaine PF PRN, milk of magnesia (CONC) QDAY PRN, naloxone PRN, ondansetron Q6H PRN **OR** ondansetron (ZOFRAN) IV Q6H PRN, oxyCODONE Q4H PRN, potassium chloride SR PRN **OR** potassium chloride PRN **OR** potassium chloride in water PRN                     Vital Signs: Last Filed                  Vital Signs: 24 Hour Range   BP: 120/61 (09/05 2000)  ABP: 159/54 (09/05 2000)  Temp: 36.7 ???C (98 ???F) (09/05 2000)  Pulse: 71 (09/05 2000)  Respirations: 26 PER MINUTE (09/05 2000)  SpO2: 95 % (09/05 2000)  O2 Delivery: Nasal Cannula (09/05 2000)  Weight: 117.3 kg (258 lb 9.6 oz) (09/05 0600)  BP: (120)/(61)   ABP: (95-160)/(45-131)   Temp:  [34.9 ???C (94.8 ???F)-36.7 ???C (98 ???F)]   Pulse:  [58-87]   Respirations:  [10 PER MINUTE-67 PER MINUTE]   SpO2:  [92 %-100 %]   O2 Delivery: Nasal Cannula    Intensity Pain Scale 0-10 (Pain 1): (not recorded) Vitals:    02/04/17 0400 02/04/17 1130 02/05/17 0600   Weight: 113.5 kg (250 lb 3.2 oz) 113.5 kg (250 lb 3.2 oz) 117.3 kg (258 lb 9.6 oz)       Critical Care Vitals:  [place]  SVI (Calculated): 37.3 (02/05/17 0402)  SVRI (Calculated): (!) 1633 (02/05/17 0402)  LVSWI (Calculated): (!) 25.9 (02/05/17 0402)   ICP Monitoring:     PA  Catheter:  PA Catheter Only  PA Pressure: (!) 40/23 (02/05/17 0900)  PA Mean (mm Hg): (!) 30 mm Hg (02/05/17 0900)  PAOP: (!) 24 MM HG (02/05/17 0402)  CO: 6.45 (02/05/17 0848)  CI: 2.98 (02/05/17 0848)  SVR (Calculated): (!) 756 (02/05/17 0402)  SVRI (Calculated): (!) 1633 (02/05/17 0402)  PVR (Calculated): 99 (02/05/17 0402)  PVRI (Calculated): (!) 214 (02/05/17 0402)  SV (Calculated): 80.6 (02/05/17 0402)  SVI (Calculated): 37.3 (02/05/17 0402)  Hemodynamics/Oxycalcs:  Hemodynamics/Oxycalcs  PA Pressure: (!) 40/23 (02/05/17 0900)  PA Mean (mm Hg): (!) 30 mm Hg (02/05/17 0900)  CVP: (!) 16 MM HG (02/05/17 0900) PAOP: (!) 24 MM HG (02/05/17 0402)  CO: 6.45 (02/05/17 0848)  CI: 2.98 (02/05/17 0848)  SVR (Calculated): (!) 756 (02/05/17 0402)  SVRI (Calculated): (!) 1633 (02/05/17 0402)  PVR (Calculated): 99 (02/05/17 0402)  PVRI (Calculated): (!) 214 (02/05/17 0402)  LVSWI (Calculated): (!) 25.9 (02/05/17 0402)  RVSWI (Calculated): 9.14 (02/05/17 0402)  SV (Calculated): 80.6 (02/05/17 0402)  SVI (Calculated): 37.3 (02/05/17 0402)    Intake/Output Summary:  (Last 24 hours)    Intake/Output Summary (Last 24 hours) at 02/05/17 2057  Last data filed at 02/05/17 2000   Gross per 24 hour   Intake          1448.33 ml   Output             1380 ml   Net            68.33 ml         Physical Exam:  General:  Alert, cooperative, no distress, appears stated age  Lungs:  Clear to auscultation bilaterally  Heart:   Regular rate and rhythm, S1, S2 normal, no murmur, click rub or gallop  Abdomen:  Soft, non-tender.  Bowel sounds normal.  No masses.  No organomegaly. and obese  Extremities: Extremities normal, atraumatic, no cyanosis or edema  Peripheral pulses   2+ and symmetric, all extremities    Artificial airway:  None                                                                                       Ventilator/ Respiratory Therapy:  No   Vent weaning trial:  Not applicable  Drains: Other see EMR    Prophylaxis Review:  Lines:  Yes; Arterial Line; Indication:  Frequent blood draws and Continuous BP monitoring; Location:  Radial  Central Line; Indication:  Med not deliverable peripherally and Hemodynamic monitoring; Type:  PICC and Internal jugular  Urinary Catheter:  Yes; Retain foley due to:  Need for accurate Intake and Output and Acute renal insufficiency or failure  Antibiotic Usage:  Yes; Infection present or suspected:  GU/GI;  Urinary tract infection (UTI)  VTE:  Mechanical prophylaxis; Sequential compression device    Lab Review:  Pertinent labs reviewed  Point of Care Testing:  (Last 24 hours): Glucose: (!) 128 (02/05/17 1605)  POC Glucose (Download): (!) 110 (02/05/17 2050)    Radiology and Other Diagnostic Procedures Review:    Pertinent radiology reviewed.    Windell Moment, MD  Pager [phone]

## 2017-02-06 NOTE — Progress Notes
Critical Care Progress Note          Today's Date:  02/06/2017  Name:  Diane Savage                       MRN:  [id number]   Admission Date: 01/31/2017  LOS: 6 days                     Assessment/Plan:   Active Problems:    NSTEMI (non-ST elevated myocardial infarction) (HCC)    Type 2 diabetes mellitus with circulatory disorder, without long-term current use of insulin (HCC)    PAF (paroxysmal atrial fibrillation) (HCC)    Atrial fibrillation with rapid ventricular response (HCC)    Chronic gastrointestinal bleeding    Transfusion-dependent anemia    CAD (coronary artery disease)    HLD (hyperlipidemia)    Hypothyroidism    Hepatitis B    Vasogenic shock (HCC)    Hyperammonemia (HCC)    Obstructive pattern present on pulmonary function testing    Respiratory acidosis    Acute blood loss anemia    Thrombocytopenia (HCC)    Junctional rhythm    AKI (acute kidney injury) (HCC)    Acute respiratory failure with hypercapnia (HCC)    Metabolic acidosis    Nausea with vomiting, unspecified      ATTESTATION    **This note is associated with the CTS ICU team note dated today.**    Date of Service: 02/06/2017    I have seen, personally fully evaluated, and discussed patient with the CTS ICU team.  The patient is critically ill s/p CABG.  I spent 50 minutes (excluding time spent performing or supervising any procedures) providing and personally directing critical care services including respiratory care, pain mgt, hemodynamic monitoring and management, lab and radiology review, medication review and management, fluid and electrolyte management and coordination of care.    Neurologically intact.  Cont to have pain but lethargic with narcotics.  Will adjust as able.      Remains vasoplegic but improving.  Goal MAP >75-80 for AKI.  Remains in SR.    Oxygenation adequate.   More CO2 retention.  Likely due to analgesics and obesity.  Discussed that she must wear her BiPAP.  She is agreeable.  CXR fairly clear.  Cont pulm hygiene. Creat 1.5.  Up slightly from baseline.  Keep BP up.  UOP marginal.  Electrolytes okay.  No diuretics yet.    H/H okay.  No signs of bleeding.  SQ heparin when okay with CTS.    Persistent nausea.  Chronic problem for her.  Treating as able.      Cont peri-op abx.  Ceftriaxone for UTI.    This patient is critically ill with dysfunction of multiple organ systems and is at risk for additional life threatening deterioration.  Cont ICU care.    Staff name:  Windell Moment, MD Date:  02/06/2017       __________________________________________________________________________________  Subjective:  Diane Savage is a 61 y.o. female.  Overnight Events: CO2 retention.    Objective:  Medications:  Scheduled Meds:    acetaminophen (TYLENOL) tablet 1,000 mg 1,000 mg Oral Q6H*   aspirin chewable tablet 81 mg 81 mg Oral QDAY   cefTRIAXone (ROCEPHIN) IVP 1 g 1 g Intravenous Q24H*   levothyroxine (SYNTHROID) tablet 175 mcg 175 mcg Oral QDAY(07)   lidocaine (LIDODERM) 5 % topical patch 1-2 patch 1-2 patch Topical QDAY   LIDOCAINE (PF) 10  MG/ML (1 %) IJ SOLN (Cabinet Override)   NOW   LIDOCAINE (PF) 10 MG/ML (1 %) IJ SOLN (Cabinet Override)   NOW   LIDOCAINE (PF) 10 MG/ML (1 %) IJ SOLN (Cabinet Override)   NOW   pantoprazole (PROTONIX) injection 40 mg 40 mg Intravenous BID(11-21)   polyethylene glycol 3350 (MIRALAX) packet 17 g 1 packet Oral QDAY   rosuvastatin (CRESTOR) tablet 40 mg 40 mg Oral QDAY   scopolamine (TRANSDERM-SCOP) patch 1 patch 1 patch Transdermal Q72H*   senna/docusate (SENOKOT-S) tablet 2 tablet 2 tablet Oral BID   Continuous Infusions:  ??? DOPamine 800 mg/D5W 250 mL infusion (dbl conc)(premade) 2 mcg/kg/min (02/06/17 1200)   ??? insulin regular (NOVOLIN R) 100 Units in sodium chloride 0.9% (NS) 100 mL IV drip (std conc) 4 Units/hr (02/06/17 1200)   ??? nitroGLYCERIN 50 mg/D5W 250 mL infusion     ??? norepinephrine (LEVOPHED) 4 mg in sodium chloride 0.9% (NS) 250 mL IV drip (std conc) Stopped (02/06/17 1031) ??? sodium chloride 0.9 %   infusion 30 mL/hr at 02/05/17 0646   ??? vasopressin (VASOSTRICT) 20 Units in sodium chloride 0.9% (NS) 100 mL IV drip (std conc) 2.4 Units/hr (02/06/17 1327)     PRN and Respiratory Meds:[START ON 02/09/2017] acetaminophen Q6H PRN **OR** [START ON 02/09/2017] acetaminophen Q6H PRN, albuterol 0.5% Q4H PRN, alum/mag hydroxide/simeth Q4H PRN, bisacodyl QDAY PRN, hydrALAZINE Q6H PRN, lidocaine PF PRN, milk of magnesia (CONC) QDAY PRN, naloxone PRN, ondansetron Q6H PRN **OR** ondansetron (ZOFRAN) IV Q6H PRN, oxyCODONE Q4H PRN, potassium chloride SR PRN **OR** potassium chloride PRN **OR** potassium chloride in water PRN                     Vital Signs: Last Filed                  Vital Signs: 24 Hour Range   BP: 135/49 (09/06 1100)  ABP: 152/47 (09/06 1600)  Temp: 36.7 ???C (98 ???F) (09/06 1600)  Pulse: 80 (09/06 1600)  Respirations: 0 PER MINUTE (09/06 1600)  SpO2: 99 % (09/06 1600)  O2 Delivery: Non Invasive Ventilation (CPAP) (09/06 1600)  BP: (120-162)/(49-70)   ABP: (105-178)/(45-92)   Temp:  [36.3 ???C (97.4 ???F)-36.7 ???C (98 ???F)]   Pulse:  [66-99]   Respirations:  [0 PER MINUTE-43 PER MINUTE]   SpO2:  [92 %-100 %]   O2 Delivery: Non Invasive Ventilation (CPAP)    Intensity Pain Scale (Self Report): (not recorded) Vitals:    02/04/17 0400 02/04/17 1130 02/05/17 0600   Weight: 113.5 kg (250 lb 3.2 oz) 113.5 kg (250 lb 3.2 oz) 117.3 kg (258 lb 9.6 oz)       Critical Care Vitals:     ICP Monitoring:     PA  Catheter:    Hemodynamics/Oxycalcs:      Intake/Output Summary:  (Last 24 hours)    Intake/Output Summary (Last 24 hours) at 02/06/17 1702  Last data filed at 02/06/17 1600   Gross per 24 hour   Intake           1865.1 ml   Output              745 ml   Net           1120.1 ml         Physical Exam:  General:  Alert, cooperative, no distress, appears stated age  Lungs:  Clear to auscultation bilaterally  Heart:   Regular rate and  rhythm, S1, S2 normal, no murmur, click rub or gallop Abdomen:  Soft, non-tender.  Bowel sounds normal.  No masses.  No organomegaly. and obese  Extremities: Extremities normal, atraumatic, no cyanosis or edema  Peripheral pulses   2+ and symmetric, all extremities    Artificial airway:  None                                                                                       Ventilator/ Respiratory Therapy:  No   Vent weaning trial:  Not applicable  Drains: Other see EMR    Prophylaxis Review:  Lines:  Yes; Arterial Line; Indication:  Frequent blood draws and Continuous BP monitoring; Location:  Radial  Central Line; Indication:  Med not deliverable peripherally and Hemodynamic monitoring; Type:  PICC and Internal jugular  Urinary Catheter:  Yes; Retain foley due to:  Need for accurate Intake and Output and Acute renal insufficiency or failure  Antibiotic Usage:  Yes; Infection present or suspected:  GU/GI;  Urinary tract infection (UTI)  VTE:  Mechanical prophylaxis; Sequential compression device    Lab Review:  Pertinent labs reviewed  Point of Care Testing:  (Last 24 hours):  Glucose: (!) 158 (02/06/17 1430)  POC Glucose (Download): (!) 156 (02/06/17 1627)    Radiology and Other Diagnostic Procedures Review:    Pertinent radiology reviewed.    Windell Moment, MD  Pager [phone]

## 2017-02-06 NOTE — Progress Notes
Cardiothoracic Surgery Critical Care   Progress Note     Diane Savage  Today's Date:  02/06/2017  Admission Date: 01/31/2017  LOS: 6 days    POD: 2    Procedure: CORONARY ARTERY BYPASS WITH ARTERIAL GRAFT - 4 GRAFTS (Internal Mammary Artery and Endovascular Vein Harvest): 33535 (CPT???)  MAZE PROCEDURE:       Active Problems:    NSTEMI (non-ST elevated myocardial infarction) (HCC)    Type 2 diabetes mellitus with circulatory disorder, without long-term current use of insulin (HCC)    PAF (paroxysmal atrial fibrillation) (HCC)    Atrial fibrillation with rapid ventricular response (HCC)    Chronic gastrointestinal bleeding    Transfusion-dependent anemia    CAD (coronary artery disease)    HLD (hyperlipidemia)    Hypothyroidism    Hepatitis B    Vasogenic shock (HCC)    Hyperammonemia (HCC)    Obstructive pattern present on pulmonary function testing    Respiratory acidosis    Acute blood loss anemia    Thrombocytopenia (HCC)    Junctional rhythm    AKI (acute kidney injury) (HCC)    Acute respiratory failure with hypercapnia (HCC)    Metabolic acidosis    Nausea with vomiting, unspecified        Assessment/Plan:      Neuro ??? A little sleepy but oriented x4. Reports pain is well controled. Continue PRN Fentanyl, oxycodone with scheduled Tylenol. D/C Dilaudid PCA.   CV ??? SR 80, holding amio for post op junctional rhythm. EP following, no indication for PPM. HX of PAF. Was not on [facility] r/t GI bleeding. Ongoing vasogenic shock post-op. Continue to titrate levophed and vasopressin for MAP goal > 80.  Cont ASA 81mg  & rosuvastatin. Hold pta metoprolol and pta enalapril while on pressors. Intra op TEE LVEF 60% with LVH.  Resp ??? Daily CXR ??? bedside interpretation: lungs expanded, mild pulmonary vascular congestion with bibasilar atelectasis. Acute hypercarbic respiratory failure post-op. On bipap 35% 15/5 RR 16. ABG 7.27/55/97/25.2. FEV 1: 52%. Wean FiO2. Continue IS & aggressive pulm hygeine. D/C CTs today. Renal ??? Baseline Scr 1.05 - 1.81. sCr 0.97-->1.22-->1.33-->1.55. Continue to monitor BMP/UOP, assess for AKI. Lasix 20 mg given overnight with minimal response, will attempt Lasix 60 mg IV if UOP does not respond. Start Dopamine @ 2 mcg/kg/min.  GI - Continue post-op bowel regimen. Adat. HX of chronic GI bleeding. Capsule study shows GAVE. Pt to follow up with GI as outpatient for EGD and possible ablation. GI has signed off. Continue Protonix BID per GI recommendations.  Ammonia level was 70 prior to surgery --> 74 today. Continue to monitor LFT's.   ID ??? Continue standard post op antibiotic for prophylaxis, per CTS protocol. Afebrile, wbc 18-->15.9. Ecoli UTI - continue rocephin x 7 days.   Heme ??? Hgb 9.8, platelets 168. Continue to monitor. HX of chronic GI bleeding. She has had multiple transfusions this admissions, + antibodies.    FEN ??? If creatinine < 2.0, replace Mg and K per CTS post op protocol. Hgb A1c 7.1%.  Continue Insulin gtt per Modified [facility] Protocol. Synthroid resumed.   Activity ??? Advance activity as tolerated. PT/OT.     Prophylaxis Review:  Lines:  Yes; Arterial Line; Indication:  Frequent blood draws and Continuous BP monitoring; Location:  Radial  Central Line; Indication:  Med not deliverable peripherally and Hemodynamic monitoring; Type:  Internal jugular  Antibiotic Usage:  UTI, rocephin  VTE:  Mechanical prophylaxis; Foot pump  Urinary Catheter:  Yes; Retain foley due to:  Need for accurate Intake and Output    Disposition: The patient is post-cardiac surgery at at risk for life threatening deterioration. Patient is critically ill s/p CABG x 4 with MAZE procedure. AKI with acute hypercarbic respiratory failure on NIV.  Continue ICU care.     I have seen, personally fully evaluated, and discussed patient with the critical care attending and cardiothoracic surgeon. The patient is critically ill, I spent 70 minutes (excluding time spent performing procedures) providing and personally directing critical care services including respiratory support, hemodynamic monitoring and management, lab and radiology review, medication review and management, fluid and electrolyte management and coordination of care.     Shanon Rosser, APRN   CTS Intensive Care  Pager [phone]  02/06/2017    Subjective:       HPI:   Diane Savage is a 61 y.o. female with PMH significant for pA-fib, HTN, HLD, NIDDM, Chronic blood anemia, transfusion dependent anemia, CAD, hypothyroidism who presented to OSH with complains of chest pain. Patient states that she has had chest pain for months. Troponin was elevated at 0.3.  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 She also developed A fib with RVR at OSH. Her Hgb at the OSH was 6.3 on arrival. Patient was transferred to [facility] for further care.  Anemia has been presents for years and has had an extensive GI work up including EGD, capsule and CE which were all negative. She was taken off of anticoagulation given her chronic anemia.  Cath revealed 80% LMCA stenosis and high-grade RCA lesion as well.  She presents today to the ICU S/P CABG x 4 with MAZE procedure. She remains in the ICU for acute hypercarbic respiratory failure and acute renal failure.       REVIEW OF SYSTEMS:   Constitutional: negative for fevers, chills and sweats  Respiratory: negative for cough or pleurisy/chest pain  Cardiovascular: negative for chest pain, chest pressure/discomfort  Gastrointestinal: negative for nausea and vomiting  Neurological: negative for memory problems and speech problems    Objective:        Medications:  Scheduled Meds:    acetaminophen (TYLENOL) tablet 1,000 mg 1,000 mg Oral Q6H*   albumin 25% injection 25 g 25 g Intravenous Q6H   aspirin chewable tablet 81 mg 81 mg Oral QDAY   cefTRIAXone (ROCEPHIN) IVP 1 g 1 g Intravenous Q24H*   furosemide (LASIX) injection 60 mg 60 mg Intravenous ONCE levothyroxine (SYNTHROID) tablet 175 mcg 175 mcg Oral QDAY(07)   lidocaine (LIDODERM) 5 % topical patch 1-2 patch 1-2 patch Topical QDAY   pantoprazole (PROTONIX) injection 80 mg 80 mg Intravenous BID(11-21)   polyethylene glycol 3350 (MIRALAX) packet 17 g 1 packet Oral QDAY   rosuvastatin (CRESTOR) tablet 40 mg 40 mg Oral QDAY   senna/docusate (SENOKOT-S) tablet 2 tablet 2 tablet Oral BID   Continuous Infusions:  ??? DOPamine 800 mg/D5W 250 mL infusion (dbl conc)(premade)     ??? insulin regular (NOVOLIN R) 100 Units in sodium chloride 0.9% (NS) 100 mL IV drip (std conc) 3 Units/hr (02/06/17 0729)   ??? nitroGLYCERIN 50 mg/D5W 250 mL infusion     ??? norepinephrine (LEVOPHED) 4 mg in sodium chloride 0.9% (NS) 250 mL IV drip (std conc) 0.04 mcg/kg/min (02/05/17 2258)   ??? sodium chloride 0.9 %   infusion 30 mL/hr at 02/05/17 0646   ??? vasopressin (VASOSTRICT) 20 Units in sodium chloride 0.9% (NS) 100 mL IV drip (std conc) 3  Units/hr (02/06/17 0409)     PRN and Respiratory Meds:[START ON 02/09/2017] acetaminophen Q6H PRN **OR** [START ON 02/09/2017] acetaminophen Q6H PRN, albuterol 0.5% Q4H PRN, alum/mag hydroxide/simeth Q4H PRN, bisacodyl QDAY PRN, hydrALAZINE Q6H PRN, lidocaine PF PRN, milk of magnesia (CONC) QDAY PRN, naloxone PRN, ondansetron Q6H PRN **OR** ondansetron (ZOFRAN) IV Q6H PRN, oxyCODONE Q4H PRN, potassium chloride SR PRN **OR** potassium chloride PRN **OR** potassium chloride in water PRN                       Vital Signs: Last Filed                  Vital Signs: 24 Hour Range   BP: 155/58 (09/06 0700)  Temp: 36.4 ???C (97.5 ???F) (09/06 0000)  Pulse: 82 (09/06 0700)  Respirations: 19 PER MINUTE (09/06 0700)  SpO2: 96 % (09/06 0700)  O2 Delivery: Non Invasive Ventilation (CPAP) (09/06 0700)  SpO2 Pulse: 77 (09/06 0700) BP: (120-157)/(50-70)   ABP: (99-178)/(48-131)   Temp:  [35.2 ???C (95.4 ???F)-36.7 ???C (98 ???F)]   Pulse:  [65-86]   Respirations:  [12 PER MINUTE-67 PER MINUTE]   SpO2:  [92 %-100 %] O2 Delivery: Non Invasive Ventilation (CPAP)   Intensity Pain Scale 0-10 (Pain 1): 10 (02/06/17 0000) Vitals:    02/04/17 0400 02/04/17 1130 02/05/17 0600   Weight: 113.5 kg (250 lb 3.2 oz) 113.5 kg (250 lb 3.2 oz) 117.3 kg (258 lb 9.6 oz)           Intake/Output Summary:  (Last 24 hours)    Intake/Output Summary (Last 24 hours) at 02/06/17 0804  Last data filed at 02/06/17 0700   Gross per 24 hour   Intake          1535.12 ml   Output              950 ml   Net           585.12 ml         Physical Exam:         Neuro: Awake, A&O x3, MAE   Cardiovascular: RRR no rub or murmur.  Respiratory: LS CTA bil - diminished in the bases  GI: soft, NT, hypoactive BS  Extremities: 1+ BLE  Incisions: Sternal incision dressing, dry and intact. No crepitus or sternal instability.      Chest Tubes OUTPUT on arrival  AIR LEAK PRESENT   Mediastinal 110 mL no   Mediastinal     Pleural  80 mL no    Pleural          Laboratory:  LABS:  Recent Labs      02/04/17   0450  02/04/17   1845  02/04/17   2200  02/05/17   0415  02/05/17   1210  02/05/17   1605  02/06/17   0220   NA  132*  137   --   133*  133*  133*  133*   K  4.0  4.3   --   4.9  4.5  4.5  4.5   CL  101  107   --   105  104  103  103   CO2  26  26   --   23  23  23  23    GAP  5  4   --   5  6  7  7    BUN  12  11   --  15  17  19  20    CR  1.05*  0.97   --   1.22*  1.33*  1.35*  1.55*   GLU  221*  154*   --   194*  127*  128*  154*   CA  8.5  8.2*   --   8.2*  8.3*  8.3*  8.7   ALBUMIN  3.2*   --    --   3.2*   --    --   4.3   MG  2.1  2.8*  3.5*  3.2*   --    --   3.1*       Recent Labs      02/03/17   1136  02/03/17   1304  02/03/17   1720  02/03/17   2120  02/03/17   2355  02/04/17   0450  02/04/17   0645  02/04/17   1845  02/05/17   0415  02/06/17   0220   WBC   --   5.6   --   4.5   --   4.9   --   14.1*  10.6  18.4*   HGB   --   9.5*   --   8.7*   --   9.0*   --   10.6*  10.1*  9.8*   HCT   --   28.7*   --   26.3*   --   27.0*   --   32.1*  30.6*  30.0* PLTCT   --   147*   --   125*   --   126*   --   131*  114*  168   INR   --    --    --    --    --    --    --   1.3*   --    --    PTT  61.0*   --   60.4*   --   59.9*   --   62.3*  26.8   --    --    AST   --    --    --    --    --   21   --    --   54*  35   ALT   --    --    --    --    --   8   --    --   10  8   ALKPHOS   --    --    --    --    --   49   --    --   37  38      Estimated Creatinine Clearance: 48.6 mL/min (A) (based on SCr of 1.55 mg/dL (H)).  Vitals:    02/04/17 0400 02/04/17 1130 02/05/17 0600   Weight: 113.5 kg (250 lb 3.2 oz) 113.5 kg (250 lb 3.2 oz) 117.3 kg (258 lb 9.6 oz)      Recent Labs      02/05/17   1605  02/06/17   0220   PHART  7.30*  7.16*   PO2ART  160*  122*           Radiology and Other Diagnostic Procedures Review:    Reviewed

## 2017-02-06 NOTE — Other
RT Critical Blood Gas Result Notification     Blood gas type (ABG, VBG, capillary): ABG  Critical result(s): Results for SHAREECE, BULTMAN (MRN [id number]) as of 02/06/2017 05:12   Ref. Range 02/06/2017 04:55   PH-ART-POC Latest Ref Range: 7.35 - 7.45  7.18 (LL)   PCO2-ART-POC Latest Ref Range: 35 - 45 MMHG 31 (L)   PO2-ART-POC Latest Ref Range: 80 - 100 MMHG 123 (H)   Bicarbonate-ART-POC Latest Ref Range: 21 - 28 MMOL/L 11.5 (L)   Base Def-ART-POC Latest Units: MMOL/L 17.0   O2 Sat-ART-POC Latest Ref Range: 95 - 99 % 98.0       Results handed to [employer] APRN  Time notified: 0500    MD/DO/NP/PA response/orders given: Redraw ABG to check to see if there was an error in the results. Replacing ART Line then redrawing gas.

## 2017-02-06 NOTE — Progress Notes
PHYSICAL THERAPY  PROGRESS NOTE       MOBILITY:  Progressive Mobility Level: Active transfer to chair  Level of Assistance: Assist X2 (for line management)  Assistive Device: Hand Held  Time Tolerated: 11-30 minutes    SUBJECTIVE:  Significant hospital events:  s/p CABG x4 on 9/4 - Temp PPM (not being paced)    Mental / Cognitive Status: Alert;Oriented;Cooperative  Persons Present: Student;Nursing Staff  Pain: Patient complains of pain  Pain Location: Neck;Back  Comments: BiPAP  Precautions: Sternal Precautions    Ambulation Assist: Independent Mobility in Community without Device  Patient Owned Equipment: 4-Wheeled Audiological scientist  Home Situation: Lives with Family  Type of Home: House  Entry Stairs: Ramp    The patient notes independence with ADLs and IADLs. Is a retired [employer] fundraiser.    BED MOBILITY/TRANSFERS:  Bed Mobility: Supine to Sit: Moderate Assist    Transfer Type: Sit to Stand  Transfer: Assistance Level: To/From;Bed Contact Guard Assist x 2   Transfer: Assistive Device: Chief of Staff Assist    The patient's vitals stable with mobility MAP >80 with position changes but patient still reports onset of nausea with standing and transfer to chair.    GAIT:  Gait Distance:  4-5 steps to transfer to bedside chair with minimal assist x 2     ASSESSMENT/PROGRESS:  Assessment/Progress: Should Improve w/ Continued PT    AM-PAC 6 Clicks Basic Mobility Inpatient  Turning from your back to your side while in a flat bed without using bed rails: A Little  Moving from lying on your back to sitting on the side of a flatbed without using bedrails : A Little  Moving to and from a bed to a chair (including a wheelchair): A Little  Standing up from a chair using your arms (e.g. wheelchair, or bedside chair): A Little  To walk in hospital room: A Little  Climbing 3-5 steps with a railing: A Little  Raw Score: 18  Standardized (T-scale) Score: 41.05  Basic Mobility CMS 0-100%: 40.47  CMS G Code Modifier for Basic Mobility: CK    GOALS: Goal Formulation: With Patient  Time For Goal Achievement: 1 day, To, 5 days  Pt Will Go Supine To/From Sit: w/ Stand By Assist  Pt Will Transfer Sit to Stand: w/ Stand By Assist  Pt Will Ambulate: Greater than 200 Feet, w/ Walker, w/ Stand By Assist    PLAN:  Plan Frequency: 5 Days per Week  Plan for progression: Progress to ambulation as patient able to tolerate, currently limited by BiPAP as well as nausea    RECOMMENDATIONS:  PT Discharge Recommendations: Home with Assistance likely - continues to be limited by nausea  Equipment Recommendations: Patient owns necessary equipment    Therapist: Elvina Sidle, PT  Date: 02/06/2017

## 2017-02-06 NOTE — Other
Procedure Note - Arterial Catheter Insertion      Indication : Hemodynamic monitoring, frequent blood draws  Site: Right radial   Prep: Chloraprep  Catheter: 20 gauge 12cm  Local anesthesia: 2% lidocaine, 3 ml local skin infiltration  Attempts: # 4 with ultrasound, no image captured  Waveform: Adequate waveform present  Procedure time:  20 Minutes    Consent obtained prior to procedure yes/Procedure  deemed medically necessary yes. An Zenia Resides test performed prior to procedure with negative finding (circulation still present with compression of artery where line is to be placed). Pt tolerated procedure well, no complications.  Extremity pink and warm after completed.

## 2017-02-06 NOTE — Progress Notes
Pt sat in the chair for 3 hours, repositioned frequently. Pt assisted with 2 back to bed.     Assessment unchanged, afebrile, oriented. Lungs clear/dim, 6L NC/BiPap. SR rate 70-80s, BP stable, pulses palpable. Foley with adequate dark yellow UO ~30-45 ml/hr. Wound vac with no output.    Current gtts: Vaso 2.4, Dopamine 2, Insulin 4

## 2017-02-06 NOTE — Progress Notes
@  1920. Report received and bedside safety check completed per protocol.     @2000 . Assessment completed per doc flowsheet. Pt A&Ox4, v/s within parameters, and afebrile. Pt c/o intolerable incisional pain and Kurt NP at bedside and ordered dilaudid 1mg  iv x1 and pca dilaudid. Will continue to monitor and report.

## 2017-02-06 NOTE — Other
Critical result or procedure called (document test and value, and read back):  PH 7.16  Time MD/NP Notified:  Geta.Reason  MD/NP Name:  Georgeanna Harrison, APRN  MD/NP Response/Orders Given:  Run point of care ABG to verify values

## 2017-02-06 NOTE — Progress Notes
OCCUPATIONAL THERAPY  NOTE     Bedside RN reports patient getting a new art line this morning, on pressors and feeling nauseated. RN plans to trial chair mode and then transfer to chair if appropriate. Occupational therapy will continue to follow and provide skilled intervention as indicated.    Therapist: Jed Limerick, OTR/L  Date: 02/06/2017

## 2017-02-07 DIAGNOSIS — I214 Non-ST elevation (NSTEMI) myocardial infarction: Secondary | ICD-10-CM

## 2017-02-07 LAB — BLOOD GASES, ARTERIAL
Lab: 121 mmHg — ABNORMAL HIGH (ref 80–100)
Lab: 125 mmHg — ABNORMAL HIGH (ref 80–100)
Lab: 20 MMOL/L — ABNORMAL LOW (ref 21–28)
Lab: 4.9 MMOL/L
Lab: 45 mmHg — ABNORMAL LOW (ref 35–45)
Lab: 46 mmHg — ABNORMAL HIGH (ref 35–45)
Lab: 51 mmHg — ABNORMAL HIGH (ref 35–45)
Lab: 7.2 g/dL — ABNORMAL LOW (ref 7.35–7.45)
Lab: 7.2 g/dL — ABNORMAL LOW (ref 7.35–7.45)
Lab: 7.2 — ABNORMAL LOW (ref 7.35–7.45)
Lab: 7.3 U/L — ABNORMAL LOW (ref 7.35–7.45)
Lab: 98 % (ref 95–99)

## 2017-02-07 LAB — POC GLUCOSE
Lab: 150 mg/dL — ABNORMAL HIGH (ref 70–100)
Lab: 132 mg/dL — ABNORMAL HIGH (ref 70–100)
Lab: 130 mg/dL — ABNORMAL HIGH (ref 70–100)
Lab: 143 mg/dL — ABNORMAL HIGH (ref 70–100)
Lab: 118 mg/dL — ABNORMAL HIGH (ref 70–100)
Lab: 98 mg/dL (ref 70–100)
Lab: 121 mg/dL — ABNORMAL HIGH (ref 70–100)
Lab: 170 mg/dL — ABNORMAL HIGH (ref 70–100)
Lab: 140 mg/dL — ABNORMAL HIGH (ref 70–100)

## 2017-02-07 LAB — CULTURE-BLOOD W/SENSITIVITY

## 2017-02-07 LAB — MAGNESIUM: Lab: 2.8 mg/dL — ABNORMAL HIGH (ref >60)

## 2017-02-07 LAB — CBC
Lab: 2.5 M/UL — ABNORMAL LOW (ref >60)
Lab: 7.9 K/UL — ABNORMAL HIGH (ref >60)
Lab: 2.6 M/UL — ABNORMAL LOW (ref 4.0–5.0)
Lab: 24 % — ABNORMAL LOW (ref 36–45)
Lab: 31 pg (ref 26–34)
Lab: 33 g/dL (ref 32.0–36.0)
Lab: 8.8 10*3/uL (ref 4.5–11.0)
Lab: 8.9 FL (ref 7–11)
Lab: 92 10*3/uL — ABNORMAL LOW (ref 150–400)
Lab: 93 FL (ref 80–100)

## 2017-02-07 LAB — COMPREHENSIVE METABOLIC PANEL: Lab: 132 MMOL/L — ABNORMAL LOW (ref >40)

## 2017-02-07 LAB — BASIC METABOLIC PANEL
Lab: 1.7 mg/dL — ABNORMAL HIGH (ref 0.4–1.00)
Lab: 131 MMOL/L — ABNORMAL LOW (ref 137–147)
Lab: 153 mg/dL — ABNORMAL HIGH (ref >60)
Lab: 22 MMOL/L (ref 21–30)
Lab: 4.3 MMOL/L — ABNORMAL HIGH (ref 3.5–5.1)

## 2017-02-07 LAB — LIPID PROFILE: Lab: 78 mg/dL — ABNORMAL LOW (ref <200)

## 2017-02-07 MED ORDER — FUROSEMIDE 10 MG/ML IJ SOLN
60 mg | Freq: Once | INTRAVENOUS | 0 refills | Status: CP
Start: 2017-02-07 — End: ?
  Administered 2017-02-07: 21:00:00 60 mg via INTRAVENOUS

## 2017-02-07 MED ORDER — INSULIN ASPART 100 UNIT/ML SC FLEXPEN
0-14 [IU] | Freq: Every day | SUBCUTANEOUS | 0 refills | Status: DC
Start: 2017-02-07 — End: 2017-02-16
  Administered 2017-02-07: 17:00:00 2 [IU] via SUBCUTANEOUS

## 2017-02-07 MED ORDER — TRAMADOL 50 MG PO TAB
50-100 mg | ORAL | 0 refills | Status: DC | PRN
Start: 2017-02-07 — End: 2017-02-11
  Administered 2017-02-07 (×2): 50 mg via ORAL
  Administered 2017-02-08: 12:00:00 100 mg via ORAL
  Administered 2017-02-08 (×2): 50 mg via ORAL

## 2017-02-07 MED ORDER — HEPARIN, PORCINE (PF) 5,000 UNIT/0.5 ML IJ SYRG
5000 [IU] | SUBCUTANEOUS | 0 refills | Status: DC
Start: 2017-02-07 — End: 2017-02-07
  Administered 2017-02-07: 13:00:00 5000 [IU] via SUBCUTANEOUS

## 2017-02-07 MED ORDER — FUROSEMIDE 10 MG/ML IJ SOLN
60 mg | Freq: Once | INTRAVENOUS | 0 refills | Status: CP
Start: 2017-02-07 — End: ?
  Administered 2017-02-07: 14:00:00 60 mg via INTRAVENOUS

## 2017-02-07 MED ORDER — HEPARIN (PORCINE) 10,000 UNIT/ML IJ SOLN (IMS)
7500 [IU] | SUBCUTANEOUS | 0 refills | Status: DC
Start: 2017-02-07 — End: 2017-02-16
  Administered 2017-02-07 – 2017-02-15 (×23): 7500 [IU] via SUBCUTANEOUS

## 2017-02-07 NOTE — Progress Notes
0000 Assessment remains unchanged. Pt reports pain is somewhat better, complains of mild nausea. IV Zofran given in addition to scheduled PO Tylenol. Pt awakens to voice, appropriate. SR on monitor, MAP 80s, SBP 170-190. UOP 50-60 over past few hours.     0021 Dr. Neila Gear and Christena Flake NP updated on BP; MAP 80-90, SBP 180s. Orders to turn Vaso off.

## 2017-02-07 NOTE — Case Management (ED)
Case Management Progress Note    NAME:Diane Savage                          MRN: [id number]              DOB:[birth year]          AGE: 61 y.o.  ADMISSION DATE: 01/31/2017             DAYS ADMITTED: LOS: 7 days      Todays Date: 02/07/2017    Plan  Dc planning ongoing at this time.  PT/OT recommending pt to dc home with assistance.      Per EMR, pt is s/p 3 from CABG x 4.  Pt is awake and alert.  Pain controlled.  Currently requiring 3L NC and refusing Bipap at night.  Pt to f/u with GI as outpatient d/t chronic GI bleeding.  Ecoli in UTI and continues on rocephin x 7 days.      Interventions  ? Support   Support: Pt/Family Updates re:POC or DC Plan, Patient Education  ? Info or Referral   Information or Referral to Community Resources: No Needs Identified  ? Discharge Planning   Discharge Planning: No Needs Identified     SW and NCM continue to follow along for dc planning needs and to provide assistance as indicated.  No current CM needs identified.    ? Medication Needs   Medication Needs: No Needs Identified  ? Financial   Financial: No Needs Identified  ? Legal   Legal: No Needs Identified  ? Other   Other/None: No needs identified    Disposition  ? Expected Discharge Date    Expected Discharge Date: 02/10/17  ? Transportation   Does the patient need discharge transport arranged?: No  Transportation Name, Phone and Availability #1: [facility]- [phone]  Does the patient use Medicaid Transportation?: No  ? Next Level of Care (Acute Psych discharges only)      ? Discharge Disposition     Durable Medical Equipment     No service has been selected for the patient.      [facility]     No service has been selected for the patient.      [facility]     No service has been selected for the patient.      [facility]     No service has been selected for the patient.          Deberah Castle, LMSW  Surgery - Cardiothoracic/Vascular  Social Work Case Manager  [phone]

## 2017-02-07 NOTE — Progress Notes
PHYSICAL THERAPY  PROGRESS NOTE       MOBILITY:  Progressive Mobility Level: Walk in hallway  Distance Walked (feet): 90 ft  Level of Assistance: Assist X2 (line managment)  Assistive Device: Walker  Time Tolerated: 11-30 minutes    SUBJECTIVE:  Significant hospital events:  s/p CABG x4 on 9/4 - some respiratory distress post operatively - briefly on bipap    Mental / Cognitive Status: Alert;Oriented;Cooperative  Persons Present: Nursing Staff  Comments: 1 L via nasal cannula  Precautions: Sternal Precautions  Ambulation Assist: Independent Mobility in Community without Device  Patient Owned Equipment: 4-Wheeled Audiological scientist  Home Situation: Lives with Family  Type of Home: House  Entry Stairs: Ramp    BED MOBILITY/TRANSFERS:  Up to chair on arrival.    Transfer Type: Sit to Stand  Transfer: Assistance Level: To/From;Bed;Minimal Assist  Transfer: Assistive Device: Nurse, adult    Able to verbalize appropriate technique from OT session.    GAIT:  Gait Distance: 90 feet (2 brief standing rest breaks due to dizziness/nausea)  Gait: Assistance Level: Minimal Assist;of 1st person;of 2nd person (for chair follow)  Gait: Assistive Device: Roller Walker  Gait: Descriptors: Decreased foot clearance RLE;Decreased foot clearance LLE;No balance loss    Anticipate the patient likely to do better with use of roller walker than ICU walker - required some assist with steering of ICU walker. Patient requesting chair follow, but did not require this session. Nausea was able to pass with standing rest break.    EDUCATION:  Persons Educated: Patient  Patient Barriers To Learning: None Noted  Interventions: Repetition of Instructions  Teaching Methods: Verbal Instruction  Patient Response: Verbalized Understanding  Topics: Plan/Goals of PT Interventions;Importance of Increasing Activity    ASSESSMENT/PROGRESS:  Assessment/Progress: Should Improve w/ Continued PT    AM-PAC 6 Clicks Basic Mobility Inpatient Turning from your back to your side while in a flat bed without using bed rails: A Little  Moving from lying on your back to sitting on the side of a flatbed without using bedrails : A Little  Moving to and from a bed to a chair (including a wheelchair): A Little  Standing up from a chair using your arms (e.g. wheelchair, or bedside chair): None  To walk in hospital room: None  Climbing 3-5 steps with a railing: None  Raw Score: 21  Standardized (T-scale) Score: 45.55  Basic Mobility CMS 0-100%: 29.52  CMS G Code Modifier for Basic Mobility: CJ    GOALS:  Goal Formulation: With Patient  Time For Goal Achievement: 1 day, To, 5 days  Pt Will Go Supine To/From Sit: w/ Stand By Assist  Pt Will Transfer Sit to Stand: w/ Stand By Assist  Pt Will Ambulate: Greater than 200 Feet, w/ Walker, w/ Stand By Assist    PLAN:  Plan Frequency: 5 Days per Week   Plan for progression: continue to work on increasing independence with mobility and increasing ambulation distances - remains limited by nausea/dizziness.    RECOMMENDATIONS:  PT Discharge Recommendations  PT Discharge Recommendations: Home with Assistance - pending continued progression of mobility  Equipment Recommendations: Patient owns necessary equipment    Therapist: Elvina Sidle, PT  Date: 02/07/2017

## 2017-02-07 NOTE — Progress Notes
Cardiothoracic Surgery Critical Care   Progress Note     Diane Savage  Today's Date:  02/07/2017  Admission Date: 01/31/2017  LOS: 7 days    POD: 3    Procedure: CORONARY ARTERY BYPASS WITH ARTERIAL GRAFT - 4 GRAFTS (Internal Mammary Artery and Endovascular Vein [facility]vest): [id number] (CPT???)  MAZE PROCEDURE:       Active Problems:    NSTEMI (non-ST elevated myocardial infarction) (HCC)    Type 2 diabetes mellitus with circulatory disorder, without long-term current use of insulin (HCC)    PAF (paroxysmal atrial fibrillation) (HCC)    Atrial fibrillation with rapid ventricular response (HCC)    Chronic gastrointestinal bleeding    Transfusion-dependent anemia    CAD (coronary artery disease)    HLD (hyperlipidemia)    Hypothyroidism    Hepatitis B    Vasogenic shock (HCC)    Hyperammonemia (HCC)    Obstructive pattern present on pulmonary function testing    Respiratory acidosis    Acute blood loss anemia    Thrombocytopenia (HCC)    Junctional rhythm    AKI (acute kidney injury) (HCC)    Acute respiratory failure with hypercapnia (HCC)    Metabolic acidosis    Nausea with vomiting, unspecified        Assessment/Plan:      Neuro ??? Awake and alert. Reports pain is well controled. Continue oxycodone and tramadol PRN with scheduled Tylenol.   CV ??? SR 90, holding amio for post op junctional rhythm. EP following, no indication for PPM. HX of PAF. Was not on [facility] r/t GI bleeding. BP 140-160/40-45 (66-75). Cont ASA 81mg  & rosuvastatin. Hold pta metoprolol and pta enalapril while on pressors. Intra op TEE LVEF 60% with LVH.  Resp ??? Daily CXR ??? bedside interpretation: lungs expanded, mild pulmonary vascular congestion with bibasilar atelectasis. Acute hypercarbic respiratory failure post-op. On 3L per NC, refuses Bipap at night, IBBV ordered. ABG 7.28/51/121/21.6. FEV 1: 52%. Wean FiO2. Continue IS & aggressive pulm hygeine.   Renal ??? Baseline Scr 1.05 - 1.69, peaked yesterday. Continue to monitor BMP/UOP, assess for AKI. 1.3L UOP/24hrs, Net -101 mL/24hrs. Lasix 60 mg given to attempt to push diuresis. Continue to wean dopamine to off over the next 4 hours.   GI - Continue post-op bowel regimen. Adat. HX of chronic GI bleeding. Capsule study shows GAVE. Pt to follow up with GI as outpatient for EGD and possible ablation. GI has signed off. Continue Protonix BID per GI recommendations.  Ammonia level was 70 prior to surgery. Continue to monitor LFT's.   ID ??? Continue standard post op antibiotic for prophylaxis, per CTS protocol. Afebrile, wbc 18-->15.9-->7.9. Ecoli UTI - continue rocephin x 7 days.   Heme ??? Hgb 9.8-->8.0, platelets 168-->92. Continue to monitor. HX of chronic GI bleeding. She has had multiple transfusions this admissions, + antibodies.    FEN ??? If creatinine < 2.0, replace Mg and K per CTS post op protocol. Hgb A1c 7.1%.  Transition off insulin gtt to [facility]. Synthroid resumed.   Activity ??? Advance activity as tolerated. PT/OT.     Prophylaxis Review:  Lines:  Yes; Arterial Line; Indication:  Frequent blood draws and Continuous BP monitoring; Location:  Radial  Central Line; Indication:  Med not deliverable peripherally and Hemodynamic monitoring; Type:  Internal jugular  Antibiotic Usage:  UTI, rocephin  VTE:  Mechanical prophylaxis; Foot pump  Urinary Catheter: Yes; Retain foley due to:  Need for accurate Intake and Output    Disposition: The  patient is post-cardiac surgery at at risk for life threatening deterioration. Patient is critically ill s/p CABG x 4 with MAZE procedure. AKI with acute hypercarbic respiratory failure on NIV.  Continue ICU care.     I have seen, personally fully evaluated, and discussed patient with the critical care attending and cardiothoracic surgeon. The patient is critically ill, I spent 70 minutes (excluding time spent performing procedures) providing and personally directing critical care services including respiratory support, hemodynamic monitoring and management, lab and radiology review, medication review and management, fluid and electrolyte management and coordination of care.     Shanon Rosser, APRN   CTS Intensive Care  Pager [phone]  02/07/2017    Subjective:       HPI:   Diane Savage is a 61 y.o. female with PMH significant for pA-fib, HTN, HLD, NIDDM, Chronic blood anemia, transfusion dependent anemia, CAD, hypothyroidism who presented to OSH with complains of chest pain. Patient states that she has had chest pain for months. Troponin was elevated at 0.3.  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 She also developed A fib with RVR at OSH. Her Hgb at the OSH was 6.3 on arrival. Patient was transferred to [facility] for further care.  Anemia has been presents for years and has had an extensive GI work up including EGD, capsule and CE which were all negative. She was taken off of anticoagulation given her chronic anemia.  Cath revealed 80% LMCA stenosis and high-grade RCA lesion as well.  She presents today to the ICU S/P CABG x 4 with MAZE procedure. She remains in the ICU for acute hypercarbic respiratory failure and acute renal failure.       REVIEW OF SYSTEMS:   Constitutional: negative for fevers, chills and sweats  Respiratory: negative for cough or pleurisy/chest pain  Cardiovascular: negative for chest pain, chest pressure/discomfort  Gastrointestinal: negative for nausea and vomiting  Neurological: negative for memory problems and speech problems    Objective:        Medications:  Scheduled Meds:    acetaminophen (TYLENOL) tablet 1,000 mg 1,000 mg Oral Q6H*   aspirin chewable tablet 81 mg 81 mg Oral QDAY   cefTRIAXone (ROCEPHIN) IVP 1 g 1 g Intravenous Q24H*   heparin (porcine) injection 7,500 Units 0.75 mL 7,500 Units Subcutaneous Q8H   insulin aspart U-100 (NOVOLOG FLEXPEN) injection PEN 0-14 Units 0-14 Units Subcutaneous 5 X Day levothyroxine (SYNTHROID) tablet 175 mcg 175 mcg Oral QDAY(07)   lidocaine (LIDODERM) 5 % topical patch 1-2 patch 1-2 patch Topical QDAY   pantoprazole (PROTONIX) injection 40 mg 40 mg Intravenous BID(11-21)   polyethylene glycol 3350 (MIRALAX) packet 17 g 1 packet Oral QDAY   rosuvastatin (CRESTOR) tablet 40 mg 40 mg Oral QDAY   scopolamine (TRANSDERM-SCOP) patch 1 patch 1 patch Transdermal Q72H*   senna/docusate (SENOKOT-S) tablet 2 tablet 2 tablet Oral BID   Continuous Infusions:  ??? DOPamine 800 mg/D5W 250 mL infusion (dbl conc)(premade) 1 mcg/kg/min (02/07/17 0908)   ??? nitroGLYCERIN 50 mg/D5W 250 mL infusion     ??? norepinephrine (LEVOPHED) 4 mg in sodium chloride 0.9% (NS) 250 mL IV drip (std conc) Stopped (02/06/17 1031)   ??? vasopressin (VASOSTRICT) 20 Units in sodium chloride 0.9% (NS) 100 mL IV drip (std conc) Stopped (02/07/17 0014)     PRN and Respiratory Meds:[START ON 02/09/2017] acetaminophen Q6H PRN **OR** [START ON 02/09/2017] acetaminophen Q6H PRN, albuterol 0.5% Q4H PRN, alum/mag hydroxide/simeth Q4H PRN, bisacodyl QDAY PRN, hydrALAZINE Q6H PRN,  lidocaine PF PRN, milk of magnesia (CONC) QDAY PRN, naloxone PRN, ondansetron Q6H PRN **OR** ondansetron (ZOFRAN) IV Q6H PRN, oxyCODONE Q4H PRN, potassium chloride SR PRN **OR** potassium chloride PRN **OR** potassium chloride in water PRN, traMADol Q6H PRN                       Vital Signs: Last Filed                  Vital Signs: 24 Hour Range   BP: 154/44 (09/07 1011)  Temp: 36.6 ???C (97.9 ???F) (09/07 0800)  Pulse: 89 (09/07 1011)  Respirations: 13 PER MINUTE (09/07 1011)  SpO2: 100 % (09/07 1011)  O2 Delivery: Nasal Cannula (09/07 1000)  SpO2 Pulse: 90 (09/07 1011) BP: (130-154)/(32-49)   ABP: (141-192)/(33-58)   Temp:  [36.6 ???C (97.9 ???F)-36.9 ???C (98.4 ???F)]   Pulse:  [75-98]   Respirations:  [0 PER MINUTE-23 PER MINUTE]   SpO2:  [94 %-100 %]   O2 Delivery: Nasal Cannula   Intensity Pain Scale (Self Report): 7 (02/07/17 1610) Vitals: 02/04/17 0400 02/04/17 1130 02/05/17 0600   Weight: 113.5 kg (250 lb 3.2 oz) 113.5 kg (250 lb 3.2 oz) 117.3 kg (258 lb 9.6 oz)           Intake/Output Summary:  (Last 24 hours)    Intake/Output Summary (Last 24 hours) at 02/07/17 1022  Last data filed at 02/07/17 1000   Gross per 24 hour   Intake          1543.17 ml   Output             1409 ml   Net           134.17 ml         Physical Exam:         Neuro: Awake, A&O x3, MAE   Cardiovascular: RRR no rub or murmur.  Respiratory: LS CTA bil - diminished in the bases  GI: soft, NT, hypoactive BS  Extremities: 1+ BLE  Incisions: Sternal incision dressing, dry and intact. No crepitus or sternal instability.    Laboratory:  LABS:  Recent Labs      02/04/17   1845  02/04/17   2200  02/05/17   0415  02/05/17   1210  02/05/17   1605  02/06/17   0220  02/06/17   1430  02/07/17   0207   NA  137   --   133*  133*  133*  133*  133*  132*   K  4.3   --   4.9  4.5  4.5  4.5  4.5  4.0   CL  107   --   105  104  103  103  103  102   CO2  26   --   23  23  23  23  22  22    GAP  4   --   5  6  7  7  8  8    BUN  11   --   15  17  19  20  25   26*   CR  0.97   --   1.22*  1.33*  1.35*  1.55*  1.76*  1.69*   GLU  154*   --   194*  127*  128*  154*  158*  147*   CA  8.2*   --   8.2*  8.3*  8.3*  8.7  8.7  8.5   ALBUMIN   --    --   3.2*   --    --   4.3   --   3.9   MG  2.8*  3.5*  3.2*   --    --   3.1*   --   2.8*       Recent Labs      02/04/17   1845  02/05/17   0415  02/06/17   0220  02/07/17   0207  02/07/17   0532   WBC  14.1*  10.6  18.4*  7.9  8.8   HGB  10.6*  10.1*  9.8*  8.0*  8.3*   HCT  32.1*  30.6*  30.0*  23.9*  24.6*   PLTCT  131*  114*  168  88*  92*   INR  1.3*   --    --    --    --    PTT  26.8   --    --    --    --    AST   --   54*  35  20   --    ALT   --   10  8  5*   --    ALKPHOS   --   37  38  33   --       Estimated Creatinine Clearance: 44.5 mL/min (A) (based on SCr of 1.69 mg/dL (H)).  Vitals:    02/04/17 0400 02/04/17 1130 02/05/17 0600 Weight: 113.5 kg (250 lb 3.2 oz) 113.5 kg (250 lb 3.2 oz) 117.3 kg (258 lb 9.6 oz)      Recent Labs      02/06/17   2200  02/07/17   0207   PHART  7.28*  7.28*   PO2ART  125*  121*           Radiology and Other Diagnostic Procedures Review:    Reviewed

## 2017-02-07 NOTE — Progress Notes
OCCUPATIONAL THERAPY  PROGRESS NOTE    Patient Name: Diane Savage                   Room/Bed: HC302/01  Admitting Diagnosis: Chest Pain  NSTEMI (non-ST elevated myocardial infarction) [facility])    Past Medical History:   Diagnosis Date   ??? Acquired hypothyroidism    ??? Arthritis    ??? Back pain    ??? Bleeding disorder (HCC)    ??? Coronary artery disease    ??? Diabetes mellitus (HCC)     Type II   ??? Hypertension    ??? Stomach disorder    ??? Vision decreased      Mobility  Progressive Mobility Level: Stand  Level of Assistance: Assist X1  Assistive Device: Hand Held  Time Tolerated: 0-10 minutes  Activity Limited By: Fatigue;Weakness;Pain    Subjective  Pertinent Dx per Physician: s/p CABG x4 on 9/4  Precautions: Sternal Precautions  Pain / Complaints: Patient agrees to participate in therapy  Pain Location: Head;Neck  Pain Level Current: 7  Comments: RN provided pain medication prior to therapeutic activity.    Objective  Psychosocial Status: Willing and Cooperative to Participate    Home Living  Type of Home: House  Home Layout: One Level  Financial risk analyst / Tub: Psychologist, counselling;Tub/Shower Unit  Bathroom Toilet: Standard    Prior Function  Level Of Independence: Independent with ADLs and functional transfers;Independent with homemaking w/ ambulation  Lives With: Spouse  Receives Help From: None Needed    ADL's  Where Assessed: Chair  Functional Transfer Assist: Minimal Assist  Functional Transfer Deficits: Verbal Cueing;Supervision/Safety;Increased Time to Complete  Comment: Patient in chair upon arrival. Declined grooming as she had performed earlier this morning. Agreeable to transfer training to work on safety and endurance. Instructed patient on sequencing to get hips moved to edge of chair. rocking and then leaning forward to stand. Patient required minimal assist for first stand and progressed to contact guard for 2 stands. Tolerated standing for up to 2 minutes before report of fatigue and pain needing seated rest break. Activity Tolerance  Endurance: 2/5 Tolerates 10-20 Minutes Exercise w/Multiple Rests  Sitting Balance: 3+/5 Sits w/o UE Support for 30 Seconds or Greater    Cognition  Overall Cognitive Status: WFL to Adequately Complete Self Care Tasks Safely    Assessment  Assessment: Decreased ADL Status;Decreased Endurance;Decreased Self-Care Trans;Decreased High-Level ADLs  Prognosis: Good  Goal Formulation: Patient  Comments: Patient activity limited by pain and nausea. Anticipate patient will progress well once these are well controlled.    AM-PAC 6 Clicks Daily Activity Inpatient  Putting on and taking off regular lower body clothes?: A Lot  Bathing (Including washing, rinsing, drying): A Lot  Toileting, which includes using toilet, bedpan, or urinal: Total (catheter)  Putting on and taking off regular upper body clothing: A Lot  Taking care of personal grooming such as brushing teeth: None  Eating meals?: None  Daily Activity Raw Score: 15  Standardized (t-scale) score: 34.69  CMS 0-100% Score: 56.46  CMS G Code Modifier: CK    Plan   Progress: Slow Progress, Medical Status Limitations  OT Frequency: 5x/week  OT Plan for Next Visit: Work on standing endurance for grooming tasks at sink and LE dressing.    ADL Goals  Patient Will Perform Grooming: Standing at Sink;w/ Mod Independent  Patient Will Perform LE Dressing: w/ Modified Independent  Patient Will Perform Toileting: w/ Modified Independent    Functional Transfer  Goals  Pt Will Perform All Functional Transfers: Modified Independent    OT Discharge Recommendations  OT Discharge Recommendations: Home with family assist-pending mobility progression  Equipment Recommendations: Shower Chair    Therapist: Azucena Kuba, OTR/L  Date: 02/07/2017

## 2017-02-07 NOTE — Progress Notes
Chaplain Note:    Admit Date: 01/31/2017     Reason for Visit: Chaplain referral (3)     Faith/Religion: Pt does not have a particular religious affiliation, although prayers are welcome.     Source of Purpose/Meaning: Pt is a retired Engineer, civil (consulting). She seems to find meaning in the work she did for so many years and in her caring and giving nature. Pt worked most recently in wound care and said she liked this work, especially the relationships that she built with patients. Pt also talked about her family, which as noted below, have had health issues to deal with.     Worries/Concerns/Struggles: Pt talked about her health situation and that she was stubborn about coming to the hospital. She said that in addition to her health issue, several family members have also dealt with health concerns. Pt said her husband has had about 11 knee surgeries and that her daughter has had surgery recently. She also talked about 2 siblings who are dealing with health issues. Pt said it's like being on a roller coaster.    Method(s) of Coping: Pt seems to cope through her direct and honest nature. She also uses her sense of humor to cope.      Support System: Pt had a visitor who may be an extended family member. Pt talked about family, as noted above. Other sources of support were not discussed.    Interventions/Plan: Chaplain listened actively, offering support and words of encouragement. Chaplain acknowledged pt's current situation and the difficult of being on the receiving end of care. Chaplain also affirmed pt's direct manner and her ability to cope with both her own and family members' health situations. Chaplain offered assurance of prayers. Pt was open and receptive to spiritual support. Chaplain will continue to follow for spiritual and emotional support.    The spiritual care team is available as needed, 24/7, through the campus switchboard [phone]).  For immediate response, please page [phone]. For a response within 24 hours, please submit an order in [facility] for a chaplain consult or call the administrative voicemail at [phone].      Please page or use consult order if patient requests visit.        Date/Time:                      User:                                    Pager: [phone]  02/07/2017 2:26 PM Dellis Filbert                     2

## 2017-02-08 LAB — POC GLUCOSE
Lab: 215 mg/dL — ABNORMAL HIGH (ref 70–100)
Lab: 175 mg/dL — ABNORMAL HIGH (ref >60)
Lab: 180 mg/dL — ABNORMAL HIGH (ref 70–100)
Lab: 206 mg/dL — ABNORMAL HIGH (ref 70–100)
Lab: 215 mg/dL — ABNORMAL HIGH (ref 70–100)

## 2017-02-08 LAB — LIPID PROFILE: Lab: 88 mg/dL — ABNORMAL LOW (ref <200)

## 2017-02-08 LAB — CBC: Lab: 7.9 K/UL — ABNORMAL LOW (ref <150)

## 2017-02-08 LAB — BASIC METABOLIC PANEL: Lab: 132 MMOL/L — ABNORMAL LOW (ref <100)

## 2017-02-08 MED ORDER — FUROSEMIDE 10 MG/ML IJ SOLN
60 mg | Freq: Once | INTRAVENOUS | 0 refills | Status: CP
Start: 2017-02-08 — End: ?
  Administered 2017-02-08: 14:00:00 60 mg via INTRAVENOUS

## 2017-02-08 MED ORDER — METHYLNALTREXONE 12 MG/0.6 ML SC SOLN
.15 mg/kg | Freq: Once | SUBCUTANEOUS | 0 refills | Status: CP
Start: 2017-02-08 — End: ?
  Administered 2017-02-08: 14:00:00 18 mg via SUBCUTANEOUS

## 2017-02-08 NOTE — Progress Notes
RT Adult Assessment Note    NAME:Diane Savage             MRN: [id number]             DOB:[birth year]          AGE: 61 y.o.  ADMISSION DATE: 01/31/2017             DAYS ADMITTED: LOS: 8 days    RT Treatment Plan:       Protocol Plan: Procedures  PEP Therapy: Q4h PEP While Awake  PAP: Q4h PAP While Awake  Oxygen/Humidity: O2 to keep SpO2 > 92%  Monitoring: Pulse oximetry BID & PRN  Comment: IS with nursing    Additional Comments:  Impressions of the patient: Pt respiratory status continuing to improve. Great effort on exercies and Recommendations to the care team: Switched pt back to PEP/PAP. This is more productive than IPPB for her    Vital Signs:  Pulse:    RR:    SpO2:    O2 Device: $$ O2 Device: Cannula  Liter Flow: O2 Liter Flow: 1 lpm  O2%:    Breath Sounds: Sputum  Suction / Cough: Spontaneous  Respiratory Effort: Respiratory Effort: Non-Labored

## 2017-02-08 NOTE — Progress Notes
Patient reassessment completed. No acute changes. A&Ox4. Afebrile. NSR on monitor. Will continue to monitor.    No current gtts

## 2017-02-08 NOTE — Progress Notes
Cardiothoracic Surgery Critical Care   Progress Note     Diane Savage  Today's Date:  02/08/2017  Admission Date: 01/31/2017  LOS: 8 days    POD: 4    Procedure: CORONARY ARTERY BYPASS WITH ARTERIAL GRAFT - 4 GRAFTS (Internal Mammary Artery and Endovascular Vein Harvest): [id number] (CPT???)  MAZE PROCEDURE:       Active Problems:    NSTEMI (non-ST elevated myocardial infarction) (HCC)    Type 2 diabetes mellitus with circulatory disorder, without long-term current use of insulin (HCC)    PAF (paroxysmal atrial fibrillation) (HCC)    Atrial fibrillation with rapid ventricular response (HCC)    Chronic gastrointestinal bleeding    Transfusion-dependent anemia    CAD (coronary artery disease)    HLD (hyperlipidemia)    Hypothyroidism    Hepatitis B    Vasogenic shock (HCC)    Hyperammonemia (HCC)    Obstructive pattern present on pulmonary function testing    Respiratory acidosis    Acute blood loss anemia    Thrombocytopenia (HCC)    Junctional rhythm    AKI (acute kidney injury) (HCC)    Acute respiratory failure with hypercapnia (HCC)    Metabolic acidosis    Nausea with vomiting, unspecified        Assessment/Plan:      Neuro ??? Awake and alert. Reports pain is well controled. Continue oxycodone and tramadol PRN with scheduled Tylenol.   CV ??? SR 90, holding amio for post op junctional rhythm. EP following, no indication for PPM. HX of PAF. Was not on [facility] r/t GI bleeding. BP 140-160/40-45 (66-75). Cont ASA 81mg  & rosuvastatin. Hold pta metoprolol and pta enalapril while on pressors. Intra op TEE LVEF 60% with LVH.  Resp ??? Daily CXR ??? bedside interpretation: lungs expanded, persistent pulmonary vascular congestion, left pleural effusion with atelectasis. Acute hypercarbic respiratory failure post-op. Refuses Bipap at night, IPPB ordered. FEV 1: 52%. SpO2 95% on 1 L NC. Wean FiO2. Continue IS & aggressive pulm hygiene.   Renal ??? Baseline Scr 1.05 - 1.69, 1.81 this am. Continue to monitor BMP/UOP, assess for AKI. UOP 1.3 L/24hrs, Net -300 mL/24hrs. Diurese with Lasix this am.    GI - ADAT. Last BM pre-op on 9/4, continue post-op bowel regimen. Relistor this am. HX of chronic GI bleeding. Capsule study shows GAVE. Pt to follow up with GI as outpatient for EGD and possible ablation. GI has signed off. Continue Protonix BID per GI recommendations.  Ammonia level was 70 prior to surgery.   ID ??? Afebrile, wbc 18-->15.9-->7.9-->7.9. Ecoli UTI - continue rocephin x 7 days.   Heme ??? Hgb 8.1, platelets 123. Continue to monitor. H/O chronic GI bleeding. She has had multiple transfusions this admissions, + antibodies.    FEN ??? If creatinine < 2.0, replace Mg and K per CTS post op protocol. Hgb A1c 7.1%.  Continue MDCF. Synthroid resumed.   Activity ??? OOBTC. Ambulate in halls with nursing, goal of at least TID. PT/OT.     Prophylaxis Review:  Lines:  Yes; Arterial Line; Indication:  Frequent blood draws and Continuous BP monitoring; Location:  Radial  Central Line; Indication:  Med not deliverable peripherally and Hemodynamic monitoring; Type:  Internal jugular d/c  Antibiotic Usage:  UTI, rocephin  VTE:  Mechanical prophylaxis; Foot pump  Urinary Catheter: No    Disposition:   Plan as above, This patient is post-cardiac surgery and at risk for life threatening deterioration. Recovering as expected.  Anticipate transfer to the  floor later today.    I have seen, personally fully evaluated, and discussed patient with the critical care attending and cardiothoracic surgeon. The patient is critically ill, I spent 60 minutes (excluding time spent performing procedures) providing and personally directing critical care services including respiratory support, hemodynamic monitoring and management, lab and radiology review, medication review and management, fluid and electrolyte management and coordination of care.     Rayne Du, APRN   CTS Intensive Care  Pager [phone]  02/08/2017    Subjective:       HPI: Diane Savage is a 61 y.o. female with PMH significant for pA-fib, HTN, HLD, NIDDM, Chronic blood anemia, transfusion dependent anemia, CAD, hypothyroidism who presented to OSH with complains of chest pain. Patient states that she has had chest pain for months. Troponin was elevated at 0.3.  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 She also developed A fib with RVR at OSH. Her Hgb at the OSH was 6.3 on arrival. Patient was transferred to [facility] for further care.  Anemia has been presents for years and has had an extensive GI work up including EGD, capsule and CE which were all negative. She was taken off of anticoagulation given her chronic anemia.  Cath revealed 80% LMCA stenosis and high-grade RCA lesion as well.  She presents today to the ICU S/P CABG x 4 with MAZE procedure. She remains in the ICU for acute hypercarbic respiratory failure and acute renal failure.       REVIEW OF SYSTEMS:   Constitutional: negative for fevers, chills and sweats  Eyes: negative for visual disturbance  Respiratory: positive for cough, negative for sputum, increased work of breathing or wheezing  Cardiovascular: negative for chest pain, chest pressure/discomfort, lower extremity edema  Gastrointestinal: positive for decreased appetite and nausea, negative for vomiting and abdominal pain  Genitourinary:positive for urinary incontinence  Neurological: positive for headaches and dizziness, negative for memory problems and speech problems    Objective:        Medications:  Scheduled Meds:    acetaminophen (TYLENOL) tablet 1,000 mg 1,000 mg Oral Q6H*   aspirin chewable tablet 81 mg 81 mg Oral QDAY   cefTRIAXone (ROCEPHIN) IVP 1 g 1 g Intravenous Q24H*   furosemide (LASIX) injection 60 mg 60 mg Intravenous ONCE   heparin (porcine) injection 7,500 Units 0.75 mL 7,500 Units Subcutaneous Q8H   insulin aspart U-100 (NOVOLOG FLEXPEN) injection PEN 0-14 Units 0-14 Units Subcutaneous 5 X Day levothyroxine (SYNTHROID) tablet 175 mcg 175 mcg Oral QDAY(07)   lidocaine (LIDODERM) 5 % topical patch 1-2 patch 1-2 patch Topical QDAY   methylnaltrexone (RELISTOR) injection 18 mg 0.15 mg/kg Subcutaneous ONCE   pantoprazole (PROTONIX) injection 40 mg 40 mg Intravenous BID(11-21)   polyethylene glycol 3350 (MIRALAX) packet 17 g 1 packet Oral QDAY   rosuvastatin (CRESTOR) tablet 40 mg 40 mg Oral QDAY   scopolamine (TRANSDERM-SCOP) patch 1 patch 1 patch Transdermal Q72H*   senna/docusate (SENOKOT-S) tablet 2 tablet 2 tablet Oral BID   Continuous Infusions:  ??? DOPamine 800 mg/D5W 250 mL infusion (dbl conc)(premade) Stopped (02/07/17 1211)   ??? nitroGLYCERIN 50 mg/D5W 250 mL infusion     ??? norepinephrine (LEVOPHED) 4 mg in sodium chloride 0.9% (NS) 250 mL IV drip (std conc) Stopped (02/06/17 1031)     PRN and Respiratory Meds:[START ON 02/09/2017] acetaminophen Q6H PRN **OR** [START ON 02/09/2017] acetaminophen Q6H PRN, albuterol 0.5% Q4H PRN, alum/mag hydroxide/simeth Q4H PRN, bisacodyl QDAY PRN, hydrALAZINE Q6H PRN, milk of magnesia (  CONC) QDAY PRN, naloxone PRN, ondansetron Q6H PRN **OR** ondansetron (ZOFRAN) IV Q6H PRN, oxyCODONE Q4H PRN, potassium chloride SR PRN **OR** potassium chloride PRN **OR** potassium chloride in water PRN, traMADol Q6H PRN                       Vital Signs: Last Filed                  Vital Signs: 24 Hour Range   BP: 127/50 (09/08 0800)  Temp: 36.6 ???C (97.9 ???F) (09/08 0800)  Pulse: 82 (09/08 0800)  Respirations: 11 PER MINUTE (09/08 0800)  SpO2: 93 % (09/08 0800)  O2 Delivery: Nasal Cannula (09/08 0500)  SpO2 Pulse: 82 (09/08 0800) BP: (120-164)/(36-68)   ABP: (126-180)/(39-55)   Temp:  [36.3 ???C (97.4 ???F)-37 ???C (98.6 ???F)]   Pulse:  [79-92]   Respirations:  [8 PER MINUTE-26 PER MINUTE]   SpO2:  [87 %-100 %]   O2 Delivery: Nasal Cannula   Intensity Pain Scale (Self Report): 6 (02/08/17 0100) Vitals:    02/04/17 1130 02/05/17 0600 02/08/17 0500 Weight: 113.5 kg (250 lb 3.2 oz) 117.3 kg (258 lb 9.6 oz) 114.6 kg (252 lb 10.4 oz)           Intake/Output Summary:  (Last 24 hours)    Intake/Output Summary (Last 24 hours) at 02/08/17 0911  Last data filed at 02/08/17 0800   Gross per 24 hour   Intake           506.97 ml   Output             1320 ml   Net          -813.03 ml         Physical Exam:         Neuro: A&O x4, MAE   Cardiovascular: RRR no rub or murmur.  Respiratory: LS CTA bil - diminished in the bases  GI: soft, NT, hypoactive BS  Extremities: trace BLE   Incisions: Sternal incision dressing, dry and intact. No crepitus or sternal instability.    Laboratory:  LABS:  Recent Labs      02/05/17   1210  02/05/17   1605  02/06/17   0220  02/06/17   1430  02/07/17   0207  02/07/17   1432  02/08/17   0403   NA  133*  133*  133*  133*  132*  131*  132*   K  4.5  4.5  4.5  4.5  4.0  4.3  4.4   CL  104  103  103  103  102  102  101   CO2  23  23  23  22  22  22  24    GAP  6  7  7  8  8  7  7    BUN  17  19  20  25   26*  27*  32*   CR  1.33*  1.35*  1.55*  1.76*  1.69*  1.71*  1.81*   GLU  127*  128*  154*  158*  147*  153*  196*   CA  8.3*  8.3*  8.7  8.7  8.5  8.8  8.9   ALBUMIN   --    --   4.3   --   3.9   --    --    MG   --    --  3.1*   --   2.8*   --    --        Recent Labs      02/06/17   0220  02/07/17   0207  02/07/17   0532  02/08/17   0403   WBC  18.4*  7.9  8.8  7.9   HGB  9.8*  8.0*  8.3*  8.1*   HCT  30.0*  23.9*  24.6*  24.3*   PLTCT  168  88*  92*  123*   AST  35  20   --    --    ALT  8  5*   --    --    ALKPHOS  38  33   --    --       Estimated Creatinine Clearance: 41.1 mL/min (A) (based on SCr of 1.81 mg/dL (H)).  Vitals:    02/04/17 1130 02/05/17 0600 02/08/17 0500   Weight: 113.5 kg (250 lb 3.2 oz) 117.3 kg (258 lb 9.6 oz) 114.6 kg (252 lb 10.4 oz)      Recent Labs      02/07/17   0207  02/07/17   1432   PHART  7.28*  7.30*   PO2ART  121*  102*           Radiology and Other Diagnostic Procedures Review:    Reviewed

## 2017-02-08 NOTE — Progress Notes
Critical Care Progress Note          Today's Date:  02/08/2017  Name:  Diane Savage                       MRN:  [id number]   Admission Date: 01/31/2017  LOS: 8 days                     Assessment/Plan:   Active Problems:    NSTEMI (non-ST elevated myocardial infarction) (HCC)    Type 2 diabetes mellitus with circulatory disorder, without long-term current use of insulin (HCC)    PAF (paroxysmal atrial fibrillation) (HCC)    Atrial fibrillation with rapid ventricular response (HCC)    Chronic gastrointestinal bleeding    Transfusion-dependent anemia    CAD (coronary artery disease)    HLD (hyperlipidemia)    Hypothyroidism    Hepatitis B    Vasogenic shock (HCC)    Hyperammonemia (HCC)    Obstructive pattern present on pulmonary function testing    Respiratory acidosis    Acute blood loss anemia    Thrombocytopenia (HCC)    Junctional rhythm    AKI (acute kidney injury) (HCC)    Acute respiratory failure with hypercapnia (HCC)    Metabolic acidosis    Nausea with vomiting, unspecified      ATTESTATION    **This note is associated with the CTS ICU team note dated today.**    Date of Service: 02/08/2017    I have seen, personally fully evaluated, and discussed patient with the CTS ICU team.  The patient is critically ill s/p CABG.  I spent 40 minutes (excluding time spent performing or supervising any procedures) providing and personally directing critical care services including respiratory care, pain mgt, hemodynamic monitoring and management, lab and radiology review, medication review and management, fluid and electrolyte management and coordination of care.    Neurologically intact.  Pain control improved.  Cont current meds.      BP much improved.  Pressors off.  Will allow permissive HTN for now.  Remains in SR.    Oxygenation adequate.   Breathing more comfortably today.  She has a slight increase in her left sided effusion today.  Not affecting respiratory status currently so will attempt aggressive diuresis.   Cont pulm hygiene.  Monitor.    Creat about the same.  UOP improving.  Would attempt more diuresis today.  Keep BP up.  Electrolytes okay.      H/H okay.  No signs of bleeding.  SQ heparin.    Persistent nausea.  Improving.  Chronic problem for her.  Treating as able.      Cont peri-op abx.  Ceftriaxone for UTI.    This patient is critically ill with dysfunction of multiple organ systems and is at risk for additional life threatening deterioration.  She is stable over the last 24 hrs.  Okay for transfer to telemetry.    Staff name:  Windell Moment, MD Date:  02/08/2017       __________________________________________________________________________________  Subjective:  Diane Savage is a 61 y.o. female.  Overnight Events: Stable night.   Objective:  Medications:  Scheduled Meds:    acetaminophen (TYLENOL) tablet 1,000 mg 1,000 mg Oral Q6H*   aspirin chewable tablet 81 mg 81 mg Oral QDAY   cefTRIAXone (ROCEPHIN) IVP 1 g 1 g Intravenous Q24H*   heparin (porcine) injection 7,500 Units 0.75 mL 7,500 Units Subcutaneous Q8H   insulin aspart  U-100 (NOVOLOG FLEXPEN) injection PEN 0-14 Units 0-14 Units Subcutaneous 5 X Day   levothyroxine (SYNTHROID) tablet 175 mcg 175 mcg Oral QDAY(07)   lidocaine (LIDODERM) 5 % topical patch 1-2 patch 1-2 patch Topical QDAY   pantoprazole (PROTONIX) injection 40 mg 40 mg Intravenous BID(11-21)   polyethylene glycol 3350 (MIRALAX) packet 17 g 1 packet Oral QDAY   rosuvastatin (CRESTOR) tablet 40 mg 40 mg Oral QDAY   scopolamine (TRANSDERM-SCOP) patch 1 patch 1 patch Transdermal Q72H*   senna/docusate (SENOKOT-S) tablet 2 tablet 2 tablet Oral BID   Continuous Infusions:    PRN and Respiratory Meds:[START ON 02/09/2017] acetaminophen Q6H PRN **OR** [START ON 02/09/2017] acetaminophen Q6H PRN, albuterol 0.5% Q4H PRN, alum/mag hydroxide/simeth Q4H PRN, bisacodyl QDAY PRN, hydrALAZINE Q6H PRN, milk of magnesia (CONC) QDAY PRN, naloxone PRN, ondansetron Q6H PRN **OR** ondansetron (ZOFRAN) IV Q6H PRN, oxyCODONE Q4H PRN, potassium chloride SR PRN **OR** potassium chloride PRN **OR** potassium chloride in water PRN, traMADol Q6H PRN                     Vital Signs: Last Filed                  Vital Signs: 24 Hour Range   BP: 130/47 (09/08 1100)  ABP: 138/47 (09/08 1500)  Temp: 36.6 ???C (97.9 ???F) (09/08 1127)  Pulse: 82 (09/08 1500)  Respirations: 13 PER MINUTE (09/08 1500)  SpO2: 95 % (09/08 1500)  O2 Delivery: Nasal Cannula (09/08 1300)  Weight: 114.6 kg (252 lb 10.4 oz) (09/08 0500)  BP: (120-164)/(41-68)   ABP: (112-176)/(35-55)   Temp:  [36.3 ???C (97.4 ???F)-36.9 ???C (98.5 ???F)]   Pulse:  [81-91]   Respirations:  [10 PER MINUTE-26 PER MINUTE]   SpO2:  [87 %-99 %]   O2 Delivery: Nasal Cannula    Intensity Pain Scale (Self Report): (not recorded) Vitals:    02/04/17 1130 02/05/17 0600 02/08/17 0500   Weight: 113.5 kg (250 lb 3.2 oz) 117.3 kg (258 lb 9.6 oz) 114.6 kg (252 lb 10.4 oz)       Critical Care Vitals:     ICP Monitoring:     PA  Catheter:    Hemodynamics/Oxycalcs:      Intake/Output Summary:  (Last 24 hours)    Intake/Output Summary (Last 24 hours) at 02/08/17 1602  Last data filed at 02/08/17 1547   Gross per 24 hour   Intake              750 ml   Output             1260 ml   Net             -510 ml         Physical Exam:  General:  Alert, cooperative, no distress, appears stated age  Lungs:  Clear to auscultation bilaterally  Heart:   Regular rate and rhythm, S1, S2 normal, no murmur, click rub or gallop  Abdomen:  Soft, non-tender.  Bowel sounds normal.  No masses.  No organomegaly. and obese  Extremities: Extremities normal, atraumatic, no cyanosis or edema  Peripheral pulses   2+ and symmetric, all extremities    Artificial airway:  None  Ventilator/ Respiratory Therapy:  No Vent weaning trial:  Not applicable  Drains: Other see EMR    Prophylaxis Review:  Lines:  Yes; Arterial Line; Indication:  Frequent blood draws and Continuous BP monitoring; Location:  Radial  Central Line; Indication:  Med not deliverable peripherally and Hemodynamic monitoring; Type:  PICC and Internal jugular  Urinary Catheter:  Yes; Retain foley due to:  Need for accurate Intake and Output and Acute renal insufficiency or failure  Antibiotic Usage:  Yes; Infection present or suspected:  GU/GI;  Urinary tract infection (UTI)  VTE:  Mechanical prophylaxis; Sequential compression device    Lab Review:  Pertinent labs reviewed  Point of Care Testing:  (Last 24 hours):  Glucose: (!) 196 (02/08/17 0403)  POC Glucose (Download): (!) 206 (02/08/17 1127)    Radiology and Other Diagnostic Procedures Review:    Pertinent radiology reviewed.    Windell Moment, MD  Pager [phone]

## 2017-02-08 NOTE — Progress Notes
Critical Care Progress Note          Today's Date:  02/07/2017  Name:  Diane Savage                       MRN:  [id number]   Admission Date: 01/31/2017  LOS: 7 days                     Assessment/Plan:   Active Problems:    NSTEMI (non-ST elevated myocardial infarction) (HCC)    Type 2 diabetes mellitus with circulatory disorder, without long-term current use of insulin (HCC)    PAF (paroxysmal atrial fibrillation) (HCC)    Atrial fibrillation with rapid ventricular response (HCC)    Chronic gastrointestinal bleeding    Transfusion-dependent anemia    CAD (coronary artery disease)    HLD (hyperlipidemia)    Hypothyroidism    Hepatitis B    Vasogenic shock (HCC)    Hyperammonemia (HCC)    Obstructive pattern present on pulmonary function testing    Respiratory acidosis    Acute blood loss anemia    Thrombocytopenia (HCC)    Junctional rhythm    AKI (acute kidney injury) (HCC)    Acute respiratory failure with hypercapnia (HCC)    Metabolic acidosis    Nausea with vomiting, unspecified      ATTESTATION    **This note is associated with the CTS ICU team note dated today.**    Date of Service: 02/07/2017    I have seen, personally fully evaluated, and discussed patient with the CTS ICU team.  The patient is critically ill s/p CABG.  I spent 50 minutes (excluding time spent performing or supervising any procedures) providing and personally directing critical care services including respiratory care, pain mgt, hemodynamic monitoring and management, lab and radiology review, medication review and management, fluid and electrolyte management and coordination of care.    Neurologically intact.  Pain control improved.  Cont current meds.      BP much improved.  Pressors off.  Will allow permissive HTN for now.  Remains in SR.    Oxygenation adequate.   CO2 the same on or off of BiPAP and she is mostly refusing BiPAP.  Cont pulm hygiene.  Expected effusion on left.  Not appearing to affect respiratory status.  Monitor. Creat looks to have peaked.  UOP improving.  Would attempt diuresis today.  Keep BP up.  Electrolytes okay.      H/H okay.  No signs of bleeding.  SQ heparin.    Persistent nausea.  Improving.  Chronic problem for her.  Treating as able.      Cont peri-op abx.  Ceftriaxone for UTI.    This patient is critically ill with dysfunction of multiple organ systems and is at risk for additional life threatening deterioration.  Cont ICU care.    Staff name:  Windell Moment, MD Date:  02/07/2017       __________________________________________________________________________________  Subjective:  Diane Savage is a 61 y.o. female.  Overnight Events: Stable night.   Objective:  Medications:  Scheduled Meds:    acetaminophen (TYLENOL) tablet 1,000 mg 1,000 mg Oral Q6H*   aspirin chewable tablet 81 mg 81 mg Oral QDAY   cefTRIAXone (ROCEPHIN) IVP 1 g 1 g Intravenous Q24H*   heparin (porcine) injection 7,500 Units 0.75 mL 7,500 Units Subcutaneous Q8H   insulin aspart U-100 (NOVOLOG FLEXPEN) injection PEN 0-14 Units 0-14 Units Subcutaneous 5 X Day   levothyroxine (  SYNTHROID) tablet 175 mcg 175 mcg Oral QDAY(07)   lidocaine (LIDODERM) 5 % topical patch 1-2 patch 1-2 patch Topical QDAY   pantoprazole (PROTONIX) injection 40 mg 40 mg Intravenous BID(11-21)   polyethylene glycol 3350 (MIRALAX) packet 17 g 1 packet Oral QDAY   rosuvastatin (CRESTOR) tablet 40 mg 40 mg Oral QDAY   scopolamine (TRANSDERM-SCOP) patch 1 patch 1 patch Transdermal Q72H*   senna/docusate (SENOKOT-S) tablet 2 tablet 2 tablet Oral BID   Continuous Infusions:  ??? DOPamine 800 mg/D5W 250 mL infusion (dbl conc)(premade) Stopped (02/07/17 1211)   ??? nitroGLYCERIN 50 mg/D5W 250 mL infusion     ??? norepinephrine (LEVOPHED) 4 mg in sodium chloride 0.9% (NS) 250 mL IV drip (std conc) Stopped (02/06/17 1031)   ??? vasopressin (VASOSTRICT) 20 Units in sodium chloride 0.9% (NS) 100 mL IV drip (std conc) Stopped (02/07/17 0014) PRN and Respiratory Meds:[START ON 02/09/2017] acetaminophen Q6H PRN **OR** [START ON 02/09/2017] acetaminophen Q6H PRN, albuterol 0.5% Q4H PRN, alum/mag hydroxide/simeth Q4H PRN, bisacodyl QDAY PRN, hydrALAZINE Q6H PRN, lidocaine PF PRN, milk of magnesia (CONC) QDAY PRN, naloxone PRN, ondansetron Q6H PRN **OR** ondansetron (ZOFRAN) IV Q6H PRN, oxyCODONE Q4H PRN, potassium chloride SR PRN **OR** potassium chloride PRN **OR** potassium chloride in water PRN, traMADol Q6H PRN                     Vital Signs: Last Filed                  Vital Signs: 24 Hour Range   BP: 138/43 (09/07 2200)  ABP: 141/47 (09/07 2200)  Temp: 36.9 ???C (98.5 ???F) (09/07 2000)  Pulse: 86 (09/07 2200)  Respirations: 13 PER MINUTE (09/07 2200)  SpO2: 96 % (09/07 2200)  O2 Delivery: Nasal Cannula (09/07 2200)  BP: (120-164)/(32-53)   ABP: (126-192)/(33-58)   Temp:  [36.6 ???C (97.9 ???F)-37 ???C (98.6 ???F)]   Pulse:  [78-93]   Respirations:  [7 PER MINUTE-24 PER MINUTE]   SpO2:  [87 %-100 %]   O2 Delivery: Nasal Cannula    Intensity Pain Scale (Self Report): (not recorded) Vitals:    02/04/17 0400 02/04/17 1130 02/05/17 0600   Weight: 113.5 kg (250 lb 3.2 oz) 113.5 kg (250 lb 3.2 oz) 117.3 kg (258 lb 9.6 oz)       Critical Care Vitals:     ICP Monitoring:     PA  Catheter:    Hemodynamics/Oxycalcs:      Intake/Output Summary:  (Last 24 hours)    Intake/Output Summary (Last 24 hours) at 02/07/17 2300  Last data filed at 02/07/17 2100   Gross per 24 hour   Intake          1107.97 ml   Output             1399 ml   Net          -291.03 ml         Physical Exam:  General:  Alert, cooperative, no distress, appears stated age  Lungs:  Clear to auscultation bilaterally  Heart:   Regular rate and rhythm, S1, S2 normal, no murmur, click rub or gallop  Abdomen:  Soft, non-tender.  Bowel sounds normal.  No masses.  No organomegaly. and obese  Extremities: Extremities normal, atraumatic, no cyanosis or edema  Peripheral pulses   2+ and symmetric, all extremities Artificial airway:  None  Ventilator/ Respiratory Therapy:  No   Vent weaning trial:  Not applicable  Drains: Other see EMR    Prophylaxis Review:  Lines:  Yes; Arterial Line; Indication:  Frequent blood draws and Continuous BP monitoring; Location:  Radial  Central Line; Indication:  Med not deliverable peripherally and Hemodynamic monitoring; Type:  PICC and Internal jugular  Urinary Catheter:  Yes; Retain foley due to:  Need for accurate Intake and Output and Acute renal insufficiency or failure  Antibiotic Usage:  Yes; Infection present or suspected:  GU/GI;  Urinary tract infection (UTI)  VTE:  Mechanical prophylaxis; Sequential compression device    Lab Review:  Pertinent labs reviewed  Point of Care Testing:  (Last 24 hours):  Glucose: (!) 153 (02/07/17 1432)  POC Glucose (Download): (!) 215 (02/07/17 2139)    Radiology and Other Diagnostic Procedures Review:    Pertinent radiology reviewed.    Windell Moment, MD  Pager [phone]

## 2017-02-08 NOTE — Progress Notes
Patient A&Ox4. Denies c/o pain at this time. Afebrile. NSR on monitor. UOP adequate. Will continue to monitor.    No current gtts

## 2017-02-08 NOTE — Progress Notes
Assessments complete and documented per flowsheet, no acute changes noted.   A/Ox4. VSS. 1lpm O2 via NC. SR on tele.   Reported adequate pain control for duration of time of CTP during this shift.   Surgical dressings CDI.   Call light within reach, will continue to monitor until transferring care to night shift RN.

## 2017-02-08 NOTE — Progress Notes
Patient resting comfortably in bed. A&Ox4. Denies c/o pain at this time. NSR on monitor. UOP >= 88ml/hr. Will continue to monitor.    No current gtts

## 2017-02-09 DIAGNOSIS — I214 Non-ST elevation (NSTEMI) myocardial infarction: Secondary | ICD-10-CM

## 2017-02-09 LAB — POC GLUCOSE
Lab: 197 mg/dL — ABNORMAL HIGH (ref 70–100)
Lab: 165 mg/dL — ABNORMAL HIGH (ref 70–100)
Lab: 257 mg/dL — ABNORMAL HIGH (ref 70–100)
Lab: 125 mg/dL — ABNORMAL HIGH (ref >60)

## 2017-02-09 LAB — URINALYSIS DIPSTICK REFLEX TO CULTURE
Lab: NEGATIVE
Lab: NEGATIVE

## 2017-02-09 LAB — URINALYSIS MICROSCOPIC REFLEX TO CULTURE

## 2017-02-09 LAB — CBC: Lab: 6.7 K/UL — ABNORMAL LOW (ref >60)

## 2017-02-09 LAB — BASIC METABOLIC PANEL: Lab: 130 MMOL/L — ABNORMAL LOW (ref >60)

## 2017-02-09 MED ORDER — FUROSEMIDE 10 MG/ML IJ SOLN
40 mg | Freq: Two times a day (BID) | INTRAVENOUS | 0 refills | Status: DC
Start: 2017-02-09 — End: 2017-02-13
  Administered 2017-02-09 – 2017-02-12 (×8): 40 mg via INTRAVENOUS

## 2017-02-09 MED ORDER — INSULIN GLARGINE 100 UNIT/ML (3 ML) SC INJ PEN
10 [IU] | Freq: Every day | SUBCUTANEOUS | 0 refills | Status: DC
Start: 2017-02-09 — End: 2017-02-10
  Administered 2017-02-09: 23:00:00 10 [IU] via SUBCUTANEOUS

## 2017-02-09 MED ORDER — OXYCODONE 5 MG PO TAB
5 mg | ORAL | 0 refills | Status: DC | PRN
Start: 2017-02-09 — End: 2017-02-12
  Administered 2017-02-11: 23:00:00 5 mg via ORAL

## 2017-02-09 MED ORDER — PHENAZOPYRIDINE 200 MG PO TAB
200 mg | Freq: Three times a day (TID) | ORAL | 0 refills | Status: DC
Start: 2017-02-09 — End: 2017-02-11
  Administered 2017-02-09 – 2017-02-11 (×6): 200 mg via ORAL

## 2017-02-09 MED ORDER — INSULIN ASPART 100 UNIT/ML SC FLEXPEN
2 [IU] | Freq: Three times a day (TID) | SUBCUTANEOUS | 0 refills | Status: DC
Start: 2017-02-09 — End: 2017-02-10

## 2017-02-09 NOTE — Progress Notes
Cardiothoracic Surgery Progress Note     Diane Savage  Today's Date:  02/09/2017  Admission Date: 01/31/2017  LOS: 9 days    POD: 5    Procedure: CORONARY ARTERY BYPASS WITH ARTERIAL GRAFT - 4 GRAFTS (Internal Mammary Artery and Endovascular Vein Harvest): 33535 (CPT???)  MAZE PROCEDURE:       Active Problems:    NSTEMI (non-ST elevated myocardial infarction) (HCC)    Type 2 diabetes mellitus with circulatory disorder, without long-term current use of insulin (HCC)    PAF (paroxysmal atrial fibrillation) (HCC)    Atrial fibrillation with rapid ventricular response (HCC)    Chronic gastrointestinal bleeding    Transfusion-dependent anemia    CAD (coronary artery disease)    HLD (hyperlipidemia)    Hypothyroidism    Hepatitis B    Vasogenic shock (HCC)    Hyperammonemia (HCC)    Obstructive pattern present on pulmonary function testing    Respiratory acidosis    Acute blood loss anemia    Thrombocytopenia (HCC)    Junctional rhythm    AKI (acute kidney injury) (HCC)    Acute respiratory failure with hypercapnia (HCC)    Metabolic acidosis    Nausea with vomiting, unspecified      Assessment/Plan:      Neuro ??? confusion. D/c scope patch, decrease oxy dose, encourage ultram, cont scheduled Tylenol.   CV ??? SR 80-90s, holding amio/BB for post op junctional rhythm. EP following, no indication for PPM. HX of PAF. Was not on [facility] r/t GI bleeding. SBP 130-140s.  Cont ASA 81mg  & rosuvastatin.  Intra op TEE LVEF 60% with LVH.  Resp ??? on 1L o2, FEV 1: 52%. Continue IS & aggressive pulm hygiene.   Renal ??? Baseline Scr 1.05 - 1.69, 1.9 this am. Continue to monitor BMP/UOP, assess for AKI. UOP 574mL/24hrs, Net +300 mL/24hrs.  net +1.9L since admit, -.4kg since admit. Start pyridium  GI - ADAT. Last BM pre-op on 9/4, continue post-op bowel regimen. Relistor yesterday. HX of chronic GI bleeding. 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 Pt to follow up with GI as outpatient for EGD and possible ablation. GI has signed off. Continue Protonix BID per GI recommendations.    ID ??? Afebrile, wbc 18-->15.9-->7.9-->7.9--6.7. Ecoli UTI - continue rocephin x 7 days.  Repeating UA today. Urine cloudy with frequency-start pyridium  Heme ??? Hgb 8.0, platelets 150.  H/O chronic GI bleeding. She has had multiple transfusions this admissions, + antibodies.    FEN ???  Hgb A1c 7.1%. On orals at home.  BS 165-215.  On [facility].  Will consult Endo. Synthroid resumed.   Activity ??? OOBTC. Ambulate in halls with nursing, goal of at least TID. PT/OT.   Disposition: d/w staff    Prophylaxis Review:  Lines:  No d/c  Antibiotic Usage:  UTI, rocephin  VTE:  Mechanical prophylaxis; Foot pump  Urinary Catheter: No    Aurea Graff, PA-C   02/09/2017    Subjective:       HPI:   Diane Savage is a 61 y.o. female with PMH significant for pA-fib, HTN, HLD, NIDDM, Chronic blood anemia, transfusion dependent anemia, CAD, hypothyroidism who presented to OSH with complains of chest pain. Patient states that she has had chest pain for months. Troponin was elevated at 0.3.  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 She also developed A fib with RVR at OSH. Her Hgb at the OSH was 6.3 on arrival. Patient was transferred to [facility] for further  care.  Anemia has been presents for years and has had an extensive GI work up including EGD, capsule and CE which were all negative. She was taken off of anticoagulation given her chronic anemia.  Cath revealed 80% LMCA stenosis and high-grade RCA lesion as well.  She presents today to the ICU S/P CABG x 4 with MAZE procedure. She remains in the ICU for acute hypercarbic respiratory failure and acute renal failure.  She was transferred to CTP yesterday, has had increasing confusion overnight    Objective:        Medications:  Scheduled Meds:    aspirin chewable tablet 81 mg 81 mg Oral QDAY   cefTRIAXone (ROCEPHIN) IVP 1 g 1 g Intravenous Q24H* heparin (porcine) injection 7,500 Units 0.75 mL 7,500 Units Subcutaneous Q8H   insulin aspart U-100 (NOVOLOG FLEXPEN) injection PEN 0-14 Units 0-14 Units Subcutaneous 5 X Day   levothyroxine (SYNTHROID) tablet 175 mcg 175 mcg Oral QDAY(07)   lidocaine (LIDODERM) 5 % topical patch 1-2 patch 1-2 patch Topical QDAY   pantoprazole (PROTONIX) injection 40 mg 40 mg Intravenous BID(11-21)   polyethylene glycol 3350 (MIRALAX) packet 17 g 1 packet Oral QDAY   rosuvastatin (CRESTOR) tablet 40 mg 40 mg Oral QDAY   senna/docusate (SENOKOT-S) tablet 2 tablet 2 tablet Oral BID   Continuous Infusions:    PRN and Respiratory Meds:acetaminophen Q6H PRN **OR** acetaminophen Q6H PRN, albuterol 0.5% Q4H PRN, alum/mag hydroxide/simeth Q4H PRN, bisacodyl QDAY PRN, hydrALAZINE Q6H PRN, milk of magnesia (CONC) QDAY PRN, naloxone PRN, ondansetron Q6H PRN **OR** ondansetron (ZOFRAN) IV Q6H PRN, oxyCODONE Q4H PRN, potassium chloride SR PRN **OR** potassium chloride PRN **OR** potassium chloride in water PRN, traMADol Q6H PRN                       Vital Signs: Last Filed                  Vital Signs: 24 Hour Range   BP: 135/46 (09/09 0742)  Temp: 36.2 ???C (97.2 ???F) (09/09 1610)  Pulse: 94 (09/09 0742)  Respirations: 18 PER MINUTE (09/09 0742)  SpO2: 94 % (09/09 0742)  O2 Delivery: Nasal Cannula (09/09 0742)  SpO2 Pulse: 82 (09/08 1500) BP: (121-149)/(42-64)   ABP: (112-161)/(35-52)   Temp:  [36.2 ???C (97.2 ???F)-37.1 ???C (98.8 ???F)]   Pulse:  [82-94]   Respirations:  [11 PER MINUTE-18 PER MINUTE]   SpO2:  [92 %-98 %]   O2 Delivery: Nasal Cannula   Intensity Pain Scale (Self Report): 7 (02/08/17 2310) Vitals:    02/04/17 1130 02/05/17 0600 02/08/17 0500   Weight: 113.5 kg (250 lb 3.2 oz) 117.3 kg (258 lb 9.6 oz) 114.6 kg (252 lb 10.4 oz)           Intake/Output Summary:  (Last 24 hours)    Intake/Output Summary (Last 24 hours) at 02/09/17 0917  Last data filed at 02/09/17 0452   Gross per 24 hour   Intake              330 ml Output              477 ml   Net             -147 ml         Physical Exam:         Neuro: A&O x4, MAE   Cardiovascular: RRR no rub or murmur.  Respiratory: LS CTA bil - diminished in the  bases  GI: soft, NT, hypoactive BS  Extremities: trace BLE   Incisions: Sternal incision dressing, dry and intact. No crepitus or sternal instability.    Laboratory:  LABS:  Recent Labs      02/06/17   1430  02/07/17   0207  02/07/17   1432  02/08/17   0403  02/09/17   0340   NA  133*  132*  131*  132*  130*   K  4.5  4.0  4.3  4.4  4.4   CL  103  102  102  101  98   CO2  22  22  22  24  26    GAP  8  8  7  7  6    BUN  25  26*  27*  32*  33*   CR  1.76*  1.69*  1.71*  1.81*  1.90*   GLU  158*  147*  153*  196*  172*   CA  8.7  8.5  8.8  8.9  9.0   ALBUMIN   --   3.9   --    --    --    MG   --   2.8*   --    --    --        Recent Labs      02/07/17   0207  02/07/17   0532  02/08/17   0403  02/09/17   0340   WBC  7.9  8.8  7.9  6.7   HGB  8.0*  8.3*  8.1*  8.0*   HCT  23.9*  24.6*  24.3*  24.7*   PLTCT  88*  92*  123*  150   AST  20   --    --    --    ALT  5*   --    --    --    ALKPHOS  33   --    --    --       Estimated Creatinine Clearance: 39.1 mL/min (A) (based on SCr of 1.9 mg/dL (H)).  Vitals:    02/04/17 1130 02/05/17 0600 02/08/17 0500   Weight: 113.5 kg (250 lb 3.2 oz) 117.3 kg (258 lb 9.6 oz) 114.6 kg (252 lb 10.4 oz)      Recent Labs      02/07/17   0207  02/07/17   1432   PHART  7.28*  7.30*   PO2ART  121*  102*           Radiology and Other Diagnostic Procedures Review:    Reviewed

## 2017-02-09 NOTE — Consults
Endocrinology Consult History and Physical   Name:  Diane Savage                                             MRN:  [id number]   Admission Date:  01/31/2017    Active Problems:    NSTEMI (non-ST elevated myocardial infarction) (HCC)    Type 2 diabetes mellitus with circulatory disorder, without long-term current use of insulin (HCC)    PAF (paroxysmal atrial fibrillation) (HCC)    Atrial fibrillation with rapid ventricular response (HCC)    Chronic gastrointestinal bleeding    Transfusion-dependent anemia    CAD (coronary artery disease)    HLD (hyperlipidemia)    Hypothyroidism    Hepatitis B    Vasogenic shock (HCC)    Hyperammonemia (HCC)    Obstructive pattern present on pulmonary function testing    Respiratory acidosis    Acute blood loss anemia    Thrombocytopenia (HCC)    Junctional rhythm    AKI (acute kidney injury) (HCC)    Acute respiratory failure with hypercapnia (HCC)    Metabolic acidosis    Nausea with vomiting, unspecified      Reason for Consult:     DM, HgbA1c 7 on orals at home, pt now s/p CABG/MAZE    Assessment / Plan     Type 2 Diabetes Mellitus, uncontrolled  Last HgbA1C 7.1 on 01/31/2017  Hemoglobin A1C   Date Value Ref Range Status   01/31/2017 7.1 (H) 4.0 - 6.0 % Final     Comment:     The ADA recommends that most patients with type 1 and type 2 diabetes maintain   an A1c level <7%.       No results found for: A1C  PTA regimen: Glimepiride 2 mg twice daily, metformin 1000 mg twice daily  Hypoglycemic episodes on this regimen: none  Follows up with PCP for diabetes management  Diabetic-complications assessment:   Retinopathy: no.    Peripheral neuropathy: none   Autonomic neuropathy: no   Nephropathy: unknown   Macrovascular complications: known CAD, S/p CABG  Risk Factor assessment  Last lipid:     Lab Results   Component Value Date    CHOL 88 02/08/2017    TRIG 85 02/08/2017    HDL 36 (L) 02/08/2017    LDL 43 02/08/2017    VLDL 17 02/08/2017    NONHDLCHOL 52 02/08/2017 BP Readings from Last 1 Encounters:   02/09/17 135/46     On ACEi/ARB?:  Enalapril  On Statin?: Rosuvastatin    CAD / NSTEMI:  s/p CABG 02/04/2017  Atrial fibrillation  Hyperlipidemia  Hypothyroidism  Anemia  AKI      Impression / Recommendations    Start Lantus 10 daily  Aspart 2 TID [facility]  [facility]    Patient creatinine such that restarting home diabetic regimen is not an option. We will start on basal bolus for now. She would likely benefit from GLP-1 agonist at discharge and if Cr improves restarting home regimen would certainly help.    Pt was discussed with Dr. Blima Rich, DO  Pager [phone]     History of Present Illness:      Diane Savage is a 61 y.o. female with hypothyroidism, arthritis, CAD, hypertension, diabetes type 2, hyperlipidemia, hypertension who presented to [facility] on 01/31/2017 as a transfer from [place]  for multivessel disease and need for CABG.  Patient previously on oral medications and now getting some insulin and endocrinology consult for diabetes management    Patient initially presented to [facility] prior to admission on 01/31/2017.  Patient presented to hospital in [facility] complaining of chest pain and had a hemoglobin of 6 was given a blood transfusion.  Troponins were elevated at that time and she underwent a cardiac catheterization that showed multivessel disease and was referred to [facility] for CABG.  Patient underwent CABG on 02/04/2017.  Patient was managed with oral medications prior to admission and at this point, postop day 5 patient is currently on a mid dose correction factor with glucose ranging anywhere from the 100s to the 200s.    Patient creatinine such that restarting home diabetic regimen is not an option. We will start on basal bolus for now. She would likely benefit from GLP-1 agonist at discharge and if Cr improves restarting home regimen would certainly help.      Past Medical History:   Diagnosis Date   ??? Acquired hypothyroidism    ??? Arthritis    ??? Back pain ??? Bleeding disorder (HCC)    ??? Coronary artery disease    ??? Diabetes mellitus (HCC)     Type II   ??? Hypertension    ??? Stomach disorder    ??? Vision decreased      Past Surgical History:   Procedure Laterality Date   ??? HX HEART CATHETERIZATION  2017   ??? CORONARY STENT PLACEMENT  2017   ??? CORONARY ARTERY BYPASS GRAFT N/A 02/04/2017    CORONARY ARTERY BYPASS WITH ARTERIAL GRAFT - 4 GRAFTS (Internal Mammary Artery and Endovascular Vein Harvest) performed by Collene Schlichter, MD at [facility]   ??? HX MAZE N/A 02/04/2017    MAZE PROCEDURE performed by Collene Schlichter, MD at [facility]   ??? ANKLE SURGERY      Left ankle reconstruction   ??? COLONOSCOPY     ??? HX KNEE ARTHROSCOPY Left    ??? HX TONSIL AND ADENOIDECTOMY     ??? TUBAL LIGATION     ??? UPPER GASTROINTESTINAL ENDOSCOPY       Family History   Problem Relation Age of Onset   ??? Diabetes Mother    ??? Heart Disease Mother    ??? High Cholesterol Mother    ??? Arthritis-rheumatoid Mother    ??? Stroke Mother    ??? Thyroid Disease Mother    ??? Coronary Artery Disease Mother    ??? Hyperlipidemia Mother    ??? Cancer Father    ??? Cancer-Breast Sister    ??? Cancer Sister    ??? Hypertension Sister    ??? Heart Disease Sister    ??? High Cholesterol Sister    ??? Arthritis-osteo Sister    ??? Migraines Sister    ??? Rashes/Skin Problems Sister    ??? Depression Sister    ??? Hyperlipidemia Sister    ??? Cancer Brother    ??? Hypertension Brother    ??? Arthritis-rheumatoid Brother    ??? Rashes/Skin Problems Brother    ??? Coronary Artery Disease Brother    ??? Heart Disease Maternal Aunt    ??? Coronary Artery Disease Paternal Uncle    ??? Hyperlipidemia Paternal Uncle    ??? Cancer Maternal Grandmother    ??? Diabetes Maternal Grandmother    ??? Heart Disease Maternal Grandmother    ??? Cancer-Colon Paternal Grandmother    ??? Cancer Paternal Grandmother    ???  Diabetes Paternal Grandmother    ??? Cancer Paternal Grandfather      Social History     Social History   ??? Marital status: Married     Spouse name: N/A   ??? Number of children: N/A ??? Years of education: N/A     Social History Main Topics   ??? Smoking status: Former Smoker     Packs/day: 2.00     Years: 10.00     Types: Cigarettes     Quit date: 09/16/1980   ??? Smokeless tobacco: Never Used   ??? Alcohol use Yes      Comment: Seldom   ??? Drug use: No   ??? Sexual activity: Not on file     Other Topics Concern   ??? Not on file     Social History Narrative   ??? No narrative on file      Immunizations (includes history and patient reported): There is no immunization history for the selected administration types on file for this patient.        Allergies:  Diltiazem; Sulfa (sulfonamide antibiotics); Duricef [cefadroxil]; Pravastatin; and Simvastatin    Review of Systems:   Constitutional: negative for fevers, chills  Eyes: negative for visual disturbance  Ears, nose, mouth, throat, and face: change in hearing  Respiratory: negative for cough or SOB  Cardiovascular: negative for chest pain, palpitations  Gastrointestinal: negative for nausea, vomiting, abdominal pain  Genitourinary:negative for hematuria  Integument/breast: negative for rash  Musculoskeletal:negative for myalgias and arthralgias  Neurological: negative for headaches, dizziness and lightheadedness  Endocrine: negative for heat or cold intolerance    Physical Exam:      Vital Signs: Last Filed In 24 Hours Vital Signs: 24 Hour Range   BP: 135/46 (09/09 0742)  Temp: 36.2 ???C (97.2 ???F) (09/09 1610)  Pulse: 97 (09/09 0929)  Respirations: 18 PER MINUTE (09/09 0742)  SpO2: 92 % (09/09 0929)  O2 Delivery: Nasal Cannula (09/09 0742)  SpO2 Pulse: 82 (09/08 1500) BP: (135-149)/(44-64)   ABP: (136-161)/(47-52)   Temp:  [36.2 ???C (97.2 ???F)-37.1 ???C (98.8 ???F)]   Pulse:  [82-97]   Respirations:  [13 PER MINUTE-18 PER MINUTE]   SpO2:  [92 %-98 %]   O2 Delivery: Nasal Cannula   Intensity Pain Scale (Self Report): 6 (02/09/17 1115)        General:  Alert, cooperative, no distress, appears stated age  Head:  Normocephalic  Eyes:  No scleral icterus Neck:  Supple, symmetrical  Lungs:  CTA  Heart:    RRR  Abdomen:  S, NT, + BS  Skin:   No rashes  Neurologic:  Alert      Medications:  No current facility-administered medications on file prior to encounter.      Current Outpatient Prescriptions on File Prior to Encounter   Medication Sig Dispense Refill   ??? enalapril (VASOTEC) 5 mg tablet Take 5 mg by mouth daily.     ??? folic acid (FOLVITE) 1 mg tablet Take 1 mg by mouth daily.     ??? furosemide (LASIX) 20 mg tablet Take 20 mg by mouth every morning.     ??? glimepiride (AMARYL) 2 mg tablet Take 2 mg by mouth twice daily.     ??? GLUCOSAMINE HCL/CHONDROITIN SU (GLUCOSAMINE-CHONDROITIN PO) Take 1 tablet by mouth twice daily.     ??? levothyroxine (SYNTHROID) 175 mcg tablet Take 175 mcg by mouth daily 30 minutes before breakfast.     ??? metFORMIN (GLUCOPHAGE) 500 mg tablet  Take 1,000 mg by mouth twice daily with meals.     ??? metoprolol XL (TOPROL XL) 100 mg extended release tablet Take 100 mg by mouth twice daily.     ??? nitroglycerin (NITROSTAT) 0.4 mg tablet Place 0.4 mg under tongue every 5 minutes as needed for Chest Pain. Max of 3 tablets, call 911.     ??? omeprazole DR(+) (PRILOSEC) 40 mg capsule Take 40 mg by mouth twice daily.     ??? ondansetron (ZOFRAN) 8 mg tablet Take 8 mg by mouth every 8 hours as needed for Nausea or Vomiting.     ??? promethazine (PHENERGAN) 25 mg tablet Take 25 mg by mouth every 6 hours as needed for Nausea or Vomiting.     ??? sucralfate (CARAFATE) 1 gram tablet Take 1 g by mouth four times daily. Take on an empty stomach.     ??? traMADol (ULTRAM) 50 mg tablet Take 50 mg by mouth every 6 hours as needed for Pain.         Lab/Radiology/Other Diagnostic Tests:  24-hour labs:    Results for orders placed or performed during the hospital encounter of 01/31/17 (from the past 24 hour(s))   POC GLUCOSE    Collection Time: 02/08/17  5:05 PM   Result Value Ref Range    Glucose, POC 215 (H) 70 - 100 MG/DL   POC GLUCOSE    Collection Time: 02/08/17  9:28 PM Result Value Ref Range    Glucose, POC 197 (H) 70 - 100 MG/DL   CBC    Collection Time: 02/09/17  3:40 AM   Result Value Ref Range    White Blood Cells 6.7 4.5 - 11.0 K/UL    RBC 2.65 (L) 4.0 - 5.0 M/UL    Hemoglobin 8.0 (L) 12.0 - 15.0 GM/DL    Hematocrit 16.1 (L) 36 - 45 %    MCV 93.1 80 - 100 FL    MCH 30.3 26 - 34 PG    MCHC 32.6 32.0 - 36.0 G/DL    RDW 09.6 (H) 11 - 15 %    Platelet Count 150 150 - 400 K/UL    MPV 8.6 7 - 11 FL   BASIC METABOLIC PANEL    Collection Time: 02/09/17  3:40 AM   Result Value Ref Range    Sodium 130 (L) 137 - 147 MMOL/L    Potassium 4.4 3.5 - 5.1 MMOL/L    Chloride 98 98 - 110 MMOL/L    CO2 26 21 - 30 MMOL/L    Anion Gap 6 3 - 12    Glucose 172 (H) 70 - 100 MG/DL    Blood Urea Nitrogen 33 (H) 7 - 25 MG/DL    Creatinine 0.45 (H) 0.4 - 1.00 MG/DL    Calcium 9.0 8.5 - 40.9 MG/DL    eGFR Non African American 27 (L) >60 mL/min    eGFR African American 33 (L) >60 mL/min   POC GLUCOSE    Collection Time: 02/09/17  7:42 AM   Result Value Ref Range    Glucose, POC 165 (H) 70 - 100 MG/DL   POC GLUCOSE    Collection Time: 02/09/17 11:46 AM   Result Value Ref Range    Glucose, POC 257 (H) 70 - 100 MG/DL     Glucose: (!) 811 (91/47/82 0340)  POC Glucose (Download): (!) 257 (02/09/17 1146)  Pertinent radiology reviewed.

## 2017-02-09 NOTE — Progress Notes
Introduced self to patient and gave folder with postop open heart surgery instructions and info about OPCR.  I explained the role of Cardiac Rehab in her care.

## 2017-02-09 NOTE — Progress Notes
Assumed care at 1900.    Assessments completed and documented per flowsheet.    A&Ox4 upon first assessment.  Increased lightheadedness, buckling of knees, and confusion progressed throughout the night specifically w/ ambulation.  Team notified.  S. Hemmings at bedside.  Will relay to day RN.    VSS per pt trend,  tolerating RA, denies 1-2 L via NC, SR BBB on tele overnight.  Pain well controlled via current regimen.  Surgical incisions CDI.  WV to -125 w/ no output overnight.  BM PTA,  Inaccurate UOP due to missing the hat in the toilet.  HFR bundle in place.  Refused 3rd walk of the day on shift due to lightheadedness.  Pt uses call light appropriately.    No other needs voiced at this time.  Call light within reach.  Will continue to monitor.

## 2017-02-10 ENCOUNTER — Encounter: Admit: 2017-02-10 | Discharge: 2017-02-10 | Payer: MEDICARE

## 2017-02-10 LAB — BASIC METABOLIC PANEL
Lab: 132 MMOL/L — ABNORMAL LOW (ref 137–147)
Lab: 190 mg/dL — ABNORMAL HIGH (ref 70–100)
Lab: 27 MMOL/L (ref 21–30)
Lab: 35 mg/dL — ABNORMAL HIGH (ref 7–25)
Lab: 4 MMOL/L — ABNORMAL LOW (ref 3.5–5.1)
Lab: 7 pg (ref 3–12)
Lab: 98 MMOL/L — ABNORMAL LOW (ref 98–110)

## 2017-02-10 LAB — CBC AND DIFF
Lab: 0 10*3/uL (ref 0–0.20)
Lab: 0.2 10*3/uL (ref 0–0.45)
Lab: 5.3 10*3/uL (ref 4.5–11.0)

## 2017-02-10 LAB — POC GLUCOSE
Lab: 198 mg/dL — ABNORMAL HIGH (ref 70–100)
Lab: 198 mg/dL — ABNORMAL HIGH (ref 70–100)
Lab: 207 mg/dL — ABNORMAL HIGH (ref 70–100)
Lab: 216 mg/dL — ABNORMAL HIGH (ref 70–100)

## 2017-02-10 LAB — MAGNESIUM: Lab: 2.6 mg/dL (ref 1.6–2.6)

## 2017-02-10 MED ORDER — METOPROLOL TARTRATE 25 MG PO TAB
12.5 mg | Freq: Two times a day (BID) | ORAL | 0 refills | Status: DC
Start: 2017-02-10 — End: 2017-02-10

## 2017-02-10 MED ORDER — METOPROLOL TARTRATE 25 MG PO TAB
12.5 mg | Freq: Two times a day (BID) | ORAL | 0 refills | Status: DC
Start: 2017-02-10 — End: 2017-02-11
  Administered 2017-02-10: 21:00:00 12.5 mg via ORAL

## 2017-02-10 MED ORDER — NPH INSULIN HUMAN RECOMB 100 UNIT/ML (3 ML) SC PEN
12 [IU] | Freq: Every day | SUBCUTANEOUS | 0 refills | Status: DC
Start: 2017-02-10 — End: 2017-02-12
  Administered 2017-02-11: 15:00:00 12 [IU] via SUBCUTANEOUS

## 2017-02-10 MED ORDER — INSULIN GLARGINE 100 UNIT/ML (3 ML) SC INJ PEN
12 [IU] | Freq: Every day | SUBCUTANEOUS | 0 refills | Status: DC
Start: 2017-02-10 — End: 2017-02-10

## 2017-02-10 NOTE — Progress Notes
Cardiothoracic Surgery Progress Note     Diane Savage  Today's Date:  02/10/2017  Admission Date: 01/31/2017  LOS: 10 days    POD: 6    Subjective:  No acute overnight events, feels very shaky when walking and legs give out    Procedure: CORONARY ARTERY BYPASS WITH ARTERIAL GRAFT - 4 GRAFTS (Internal Mammary Artery and Endovascular Vein Harvest): 33535 (CPT???)  MAZE PROCEDURE:       Active Problems:    NSTEMI (non-ST elevated myocardial infarction) (HCC)    Type 2 diabetes mellitus with circulatory disorder, without long-term current use of insulin (HCC)    PAF (paroxysmal atrial fibrillation) (HCC)    Atrial fibrillation with rapid ventricular response (HCC)    Chronic gastrointestinal bleeding    Transfusion-dependent anemia    CAD (coronary artery disease)    HLD (hyperlipidemia)    Hypothyroidism    Hepatitis B    Hyperammonemia (HCC)    Obstructive pattern present on pulmonary function testing    Acute blood loss anemia    Thrombocytopenia (HCC)    Junctional rhythm    AKI (acute kidney injury) (HCC)    Acute respiratory failure with hypercapnia (HCC)    Nausea with vomiting, unspecified      Assessment/Plan:      Neuro ??? confusion at times, d/c oxy, encourage ultram or Tylenol for pain.   CV ??? SR 80, holding amio/BB for post-op JR (has h/o pAF). EP following. Was not on [facility] r/t GI bleeding. SBP 120-150s.  Cont ASA 81mg  & rosuvastatin.  Intra op TEE LVEF 60% with LVH.  Resp ??? on 2L O2, FEV1: 52%. Continue IS & aggressive pulm hygiene.  CXR: bibasilar atelectasis/pl effusion, left greater than right  Renal ??? CKD, baseline Creat 1.0-1.7), creat 1.7-->1.8-->1.9-->1.84 this am. UOP 1.8L/24 hrs on Lasix 40mg  IV bid, Net +810mL since admit, close to POW. Cont pyridium  GI - +BM 9/10, cont bowel regimen. H/o chronic GI bleeding. 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 Pt to follow up with GI as outpatient for EGD and possible ablation. GI has signed off. Continue Protonix BID per GI recommendations.    ID ??? Afebrile, WBC down to 5.3. Ecoli UTI - continue rocephin x 7 days.  UA: moderate bacteria, trace leuk, cont Pyridium  Heme ??? Hb 8.0, Plt 148.  H/O chronic GI bleeding, has received multiple transfusions this admissions, +Ab, SQ heparin for DVT ppx.  Hematology signed off.  FEN ???  H/o NIDDM, HbA1c 7.1 on orals PTA, BS 125-257 on lantus and novolog, ssi.  Endo following, PTA Synthroid.   Activity ??? OOBTC, unsteady gait with decreased attention span. Ambulate in halls as able, goal of at least TID. PT/OT-walked 73' with PT Fri and 20' yest with CR, will lneed placement  Disposition: work on placement- SW needs to see this morning, monitor labs, f/u urine cx, Endo rec's    Prophylaxis Review:  Lines:  No d/c  Antibiotic Usage:  UTI, rocephin  VTE:  Mechanical prophylaxis; Foot pump  Urinary Catheter: No    Diane Birch, PA-C   02/10/2017    Subjective:       HPI:   Diane Savage is a 61 y.o. female with PMH significant for pA-fib, HTN, HLD, NIDDM, Chronic blood anemia, transfusion dependent anemia, CAD, hypothyroidism who presented to OSH with complains of chest pain. Patient states that she has had chest pain for months. Troponin was elevated at 0.3.  Heart cath was performed which demonstrated multivessel  CAD. She also developed A fib with RVR at OSH. Her Hgb at the OSH was 6.3 on arrival. Patient was transferred to [facility] for further care.  Anemia has been presents for years and has had an extensive GI work up including EGD, capsule and CE which were all negative. She was taken off of anticoagulation given her chronic anemia.  Cath revealed 80% LMCA stenosis and high-grade RCA lesion as well.  She presents today to the ICU S/P CABG x 4 with MAZE procedure. She remains in the ICU for acute hypercarbic respiratory failure and acute renal failure.  She was transferred to CTP yesterday, has had increasing confusion overnight    Objective:        Medications:  Scheduled Meds: aspirin chewable tablet 81 mg 81 mg Oral QDAY   cefTRIAXone (ROCEPHIN) IVP 1 g 1 g Intravenous Q24H*   furosemide (LASIX) injection 40 mg 40 mg Intravenous BID(9-17)   heparin (porcine) injection 7,500 Units 0.75 mL 7,500 Units Subcutaneous Q8H   insulin aspart U-100 (NOVOLOG FLEXPEN) injection PEN 0-14 Units 0-14 Units Subcutaneous 5 X Day   insulin aspart U-100 (NOVOLOG FLEXPEN) injection PEN 2 Units 2 Units Subcutaneous TID after meals   insulin glargine (LANTUS SOLOSTAR, BASAGLAR) injection PEN 10 Units 10 Units Subcutaneous QDAY   levothyroxine (SYNTHROID) tablet 175 mcg 175 mcg Oral QDAY(07)   lidocaine (LIDODERM) 5 % topical patch 1-2 patch 1-2 patch Topical QDAY   pantoprazole (PROTONIX) injection 40 mg 40 mg Intravenous BID(11-21)   phenazopyridine (PYRIDIUM) tablet 200 mg 200 mg Oral TID   polyethylene glycol 3350 (MIRALAX) packet 17 g 1 packet Oral QDAY   rosuvastatin (CRESTOR) tablet 40 mg 40 mg Oral QDAY   senna/docusate (SENOKOT-S) tablet 2 tablet 2 tablet Oral BID   Continuous Infusions:    PRN and Respiratory Meds:acetaminophen Q6H PRN **OR** acetaminophen Q6H PRN, albuterol 0.5% Q4H PRN, alum/mag hydroxide/simeth Q4H PRN, bisacodyl QDAY PRN, hydrALAZINE Q6H PRN, milk of magnesia (CONC) QDAY PRN, naloxone PRN, ondansetron Q6H PRN **OR** ondansetron (ZOFRAN) IV Q6H PRN, oxyCODONE Q4H PRN, potassium chloride SR PRN **OR** potassium chloride PRN **OR** potassium chloride in water PRN, traMADol Q6H PRN                       Vital Signs: Last Filed                  Vital Signs: 24 Hour Range   BP: 133/47 (09/10 0400)  Temp: 36.6 ???C (97.8 ???F) (09/10 0400)  Pulse: 88 (09/10 0400)  Respirations: 18 PER MINUTE (09/10 0400)  SpO2: 95 % (09/10 0400)  O2 Delivery: Nasal Cannula (09/10 0400) BP: (129-154)/(39-69)   Temp:  [36.2 ???C (97.2 ???F)-37.5 ???C (99.5 ???F)]   Pulse:  [82-97]   Respirations:  [18 PER MINUTE]   SpO2:  [92 %-99 %]   O2 Delivery: Nasal Cannula Intensity Pain Scale (Self Report): 5 (02/09/17 2315) Vitals:    02/05/17 0600 02/08/17 0500 02/10/17 0449   Weight: 117.3 kg (258 lb 9.6 oz) 114.6 kg (252 lb 10.4 oz) 115.8 kg (255 lb 6.4 oz)           Intake/Output Summary:  (Last 24 hours)    Intake/Output Summary (Last 24 hours) at 02/10/17 0730  Last data filed at 02/10/17 0500   Gross per 24 hour   Intake              690 ml   Output  1850 ml   Net            -1160 ml         Physical Exam:         Neuro: A&O x4  Cardiovascular: RRR no rub or murmur.  Respiratory: LS CTA bil - diminished in the bases  GI: soft, NT, +BS  Extremities: trace BLE   Incisions: superficial wound vac removed, inc c/d/i    Laboratory:  LABS:  Recent Labs      02/07/17   1432  02/08/17   0403  02/09/17   0340  02/10/17   0530   NA  131*  132*  130*  132*   K  4.3  4.4  4.4  4.0   CL  102  101  98  98   CO2  22  24  26  27    GAP  7  7  6  7    BUN  27*  32*  33*  35*   CR  1.71*  1.81*  1.90*  1.84*   GLU  153*  196*  172*  190*   CA  8.8  8.9  9.0  9.0       Recent Labs      02/08/17   0403  02/09/17   0340  02/10/17   0530   WBC  7.9  6.7  5.3   HGB  8.1*  8.0*  8.0*   HCT  24.3*  24.7*  24.0*   PLTCT  123*  150  148*      Estimated Creatinine Clearance: 40.6 mL/min (A) (based on SCr of 1.84 mg/dL (H)).  Vitals:    02/05/17 0600 02/08/17 0500 02/10/17 0449   Weight: 117.3 kg (258 lb 9.6 oz) 114.6 kg (252 lb 10.4 oz) 115.8 kg (255 lb 6.4 oz)      Recent Labs      02/07/17   1432   PHART  7.30*   PO2ART  102*           Radiology and Other Diagnostic Procedures Review:    Reviewed

## 2017-02-10 NOTE — Progress Notes
PHYSICAL THERAPY  PROGRESS NOTE       MOBILITY:  Mobility  Progressive Mobility Level: Active transfer to chair   Level of Assistance: Assist X1   Assistive Device: Walker   Time Tolerated: 0-10 minutes   Activity Limited By: Patient request to stop     SUBJECTIVE:  Subjective  Significant hospital events: s/p CABG x4 on 9/4 - currently with Temp PPM (underlying rhythmn - discussed with attending - ok to mobilize), no currently plans for permanent pacer  Mental / Cognitive Status: Alert;Oriented;Cooperative  Persons Present: Occupational Therapist   Pain: Patient complains of pain;4/10;Before activity;5/10;During activity  Pain Location: Bilateral;Shoulder;Back  Pain Interventions: Patient agrees to participate in therapy;Treatment altered to patient's pain tolerance;Patient declines pain meds  Comments: Pt was in bed and agreed to transfer to the commode only.  Precautions: Sternal Precautions  Comments: Pt was on 2 liters oxygen.  Ambulation Assist: Independent Mobility in [employer] without Device  Patient Owned Equipment: 4-Wheeled Audiological scientist  Home Situation: Lives with Family  Type of Home: House   Entry Stairs: Ramp  Comments: The patient notes independence with ADLs and IADLs. Is a retired Charity fundraiser.    BED MOBILITY/TRANSFERS:  Bed Mobility/Transfers  Bed Mobility: Supine to Sit: Minimal Assist;Verbal Cues;Head of Bed Elevated;Assist with Trunk  Bed Mobility: Sit to Supine: Moderate Assist;Verbal Cues;Bed Flat;Assist with B LE  Transfer Type: Sit to/from Stand;Stand Pivot  Transfer: Assistance Level: To/From;Bed;Commode (contact assist)  Transfer: Assistive Device: Nurse, adult  Transfers: Type Of Assistance: Verbal Cues;For Safety Considerations  End Of Activity Status: In Bed;Instructed Patient to Request Assist with Mobility;Instructed Patient to Use Call Light  Comments: Pt declined further activity - declined walking and declined attempting pericare after using the commode.    ASSESSMENT/PROGRESS: Assessment/Progress  Comments: Pt has made slight improvements in mobility.  Anticipate pt could do more but is self-limiting.  AM-PAC 6 Clicks Basic Mobility Inpatient  Turning from your back to your side while in a flat bed without using bed rails: A Little  Moving from lying on your back to sitting on the side of a flatbed without using bedrails : A Lot  Moving to and from a bed to a chair (including a wheelchair): A Little  Standing up from a chair using your arms (e.g. wheelchair, or bedside chair): A Little  To walk in hospital room: A Little  Climbing 3-5 steps with a railing: A Lot  Raw Score: 16  Standardized (T-scale) Score: 38.32  Basic Mobility CMS 0-100%: 47.12  CMS G Code Modifier for Basic Mobility: CK    GOALS:  Goals  Goal Formulation: With Patient  Time For Goal Achievement: 1 day, To, 5 days  Pt Will Go Supine To/From Sit: w/ Stand By Assist  Pt Will Transfer Sit to Stand: w/ Stand By Assist  Pt Will Ambulate: Greater than 200 Feet, w/ Dan Humphreys, w/ Stand By Assist    PLAN:  Plan   Treatment Interventions: Mobility Training;Endurance Training;Strengthening  Plan Frequency: 5 Days per Week  PT Plan for Next Visit: *bed mobility while protecting sternal precautions  *progress ambulation distance with chair follow    RECOMMENDATIONS:  PT Discharge Recommendations  PT Discharge Recommendations: Home with Assistance  Equipment Recommendations: Patient owns necessary equipment  Recommend ongoing assistance for: In and out of house;Transfers;Bed mobility;Ambulation;Stairs    Therapist: Norva Karvonen, Physical therapist assistant  Date: 02/10/2017

## 2017-02-10 NOTE — Progress Notes
02/10/17 1135   Cardiac Rehab Activity   Distance Walked (feet) 40 ft   BP Pre-activity (!) 157/39  (Took at 2nd time: 162/43)   BP Post-activity 150/46   HR Pre-activity 90 bpm   HR Post-activity 94   SaO2 Pre-activity 95 %   SaO2 Post-activity 92   O2 Device Nasal Cannula   O2 (lpm) 2 LPM   Comments Pt reports feeling dizzy and just wanting to sleep. Pt agreeable to ambulate. Pt ambulated with a wheeled walker. Pt reporting that her knee felt like it was going to give out. Pt stopped for one seated rest break before ambulating back to her room. Pt reports feeling tired and "woozy" when she returned from ambulation. Pt was assisted back to bed at the completion of ambulation. RN aware of BP readings.    Mobility   Progressive Mobility Level 8   Level of Assistance Assist X2   Assistive Device Walker   Time Tolerated 0-10 minutes   Activity Limited By Dizziness;Fatigue;Shortness of air;Weakness

## 2017-02-10 NOTE — Progress Notes
OCCUPATIONAL THERAPY  PROGRESS NOTE    Patient Name: Diane Savage                   Room/Bed: HC420/01  Admitting Diagnosis: Chest Pain  NSTEMI (non-ST elevated myocardial infarction) [facility])    Past Medical History:   Diagnosis Date   ??? Acquired hypothyroidism    ??? Arthritis    ??? Back pain    ??? Bleeding disorder (HCC)    ??? Coronary artery disease    ??? Diabetes mellitus (HCC)     Type II   ??? Hypertension    ??? Stomach disorder    ??? Vision decreased      Mobility  Progressive Mobility Level: Active transfer to chair  Level of Assistance: Assist X1  Assistive Device: Walker  Time Tolerated: 11-30 minutes  Activity Limited By: Fatigue;Weakness    Subjective  Pertinent Dx per Physician: s/p CABG x4 on 9/4  Precautions: Sternal Precautions;Falls  Pain / Complaints: Patient agrees to participate in therapy  Pain Location: Incisional  Pain Level Current: 5  Comments: Patient rated pain as 4/10 prior to activity and 5/10 while standing.    Objective  Psychosocial Status: Willing and Cooperative to Participate  Persons Present: Physical Therapist    Home Living  Type of Home: House  Home Layout: One Level  Financial risk analyst / Tub: Psychologist, counselling;Tub/Shower Unit  Bathroom Toilet: Standard    Prior Function  Level Of Independence: Independent with ADLs and functional transfers;Independent with homemaking w/ ambulation  Lives With: Spouse  Receives Help From: None Needed  Other Function Comments: Patient reports she and spouse share responsibility for performing IADLs. Spouse can perform on his own if needed upon patient return home.     ADL's  Where Assessed: Supine, Bed;Edge of Bed (BSC)  Toileting Assist: Maximum Assist  Toileting Deficits: Steadying;Supervision/Safety;Bedside Commode;Perineal Hygiene  Functional Transfer Assist: Minimal Assist  Functional Transfer Deficits: Verbal Cueing;Supervision/Safety;Increased Time to Complete  Comment: Patient required minimal assist for supine/sit transfer to/from EOB with HOB elevated. Patient completed sit/stand transfer to/from EOB/BSC with contact guard assist. Required maximum assist for perineal hygiene during toileting due to patient reporting with PICC and swelling I can't.    Activity Tolerance  Endurance: 2/5 Tolerates 10-20 Minutes Exercise w/Multiple Rests  Sitting Balance: 4/5 Moves/Returns Trunkal Midpoint 1-2 Inches in Multiple Planes    Cognition  Overall Cognitive Status: WFL to Adequately Complete Self Care Tasks Safely     Assessment  Assessment: Decreased ADL Status;Decreased Endurance;Decreased Self-Care Trans;Decreased High-Level ADLs  Prognosis: Good  Goal Formulation: Patient  Comments: At this time patient is slowly progressing towards therapy goals. At this time, patient is self-limiting and does not want to attempt things on her own and asks others to do so.    AM-PAC 6 Clicks Daily Activity Inpatient  Putting on and taking off regular lower body clothes?: A Lot  Bathing (Including washing, rinsing, drying): A Lot  Toileting, which includes using toilet, bedpan, or urinal: A Lot  Putting on and taking off regular upper body clothing: A Lot  Taking care of personal grooming such as brushing teeth: A Little  Eating meals?: None  Daily Activity Raw Score: 15  Standardized (t-scale) score: 34.69  CMS 0-100% Score: 56.46  CMS G Code Modifier: CK    Plan   Progress: Progressing Toward Goals  OT Frequency: 5x/week  OT Plan for Next Visit: LE dressing, standing ADLs, toileting    ADL Goals  Patient Will Perform  Grooming: Standing at Sink;w/ Mod Independent  Patient Will Perform LE Dressing: w/ Modified Independent  Patient Will Perform Toileting: w/ Modified Independent    Functional Transfer Goals  Pt Will Perform All Functional Transfers: Modified Independent    OT Discharge Recommendations  OT Discharge Recommendations: Home with family assist - pending mobility progress.  Equipment Recommendations: Shower Chair Comments: At this time, patient needs physical assistance for toileting, however, anticipate patient will progress to home with assistance with increase in self-motivation.    Therapist: Siri Cole, OTS  Date: 02/10/2017

## 2017-02-10 NOTE — Progress Notes
Assumed pt care, upon arrival to pt room pt is A&OX3, but demonstrates poor attention span with intermittent tremors.   NSR on tele. Pt is up with assistance x 3, pt has poor & unsteady gait.   Wound vac - 125. Fall precautions implemented, call light within reach, will continue to monitor patient.

## 2017-02-10 NOTE — Progress Notes
Pt SR on tele. Around 0930, pt appeared to be in a fib with rates in the 140's. Pt converted back to SR before an EKG could be obtained. Primary team notified. This afternoon, pt ST with rates in the 120's. EKG obtained to confirm & primary team notified. New orders received. Pt's HR trending down after metoprolol given. Pt had good appetite at meals. Pt requiring much encouragement to ambulate/get out of bed. Pt declined ambulation this evening. Pt resting in bed at this time. Will continue to monitor.
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
## 2017-02-11 ENCOUNTER — Inpatient Hospital Stay: Admit: 2017-02-11 | Discharge: 2017-02-11 | Payer: MEDICARE

## 2017-02-11 DIAGNOSIS — I214 Non-ST elevation (NSTEMI) myocardial infarction: Secondary | ICD-10-CM

## 2017-02-11 LAB — POC GLUCOSE
Lab: 231 mg/dL — ABNORMAL HIGH (ref 70–100)
Lab: 169 mg/dL — ABNORMAL HIGH (ref 70–100)
Lab: 181 mg/dL — ABNORMAL HIGH (ref >60)
Lab: 310 mg/dL — ABNORMAL HIGH (ref 70–100)
Lab: 134 mg/dL — ABNORMAL HIGH (ref 70–100)

## 2017-02-11 LAB — BASIC METABOLIC PANEL: Lab: 133 MMOL/L — ABNORMAL LOW (ref 137–147)

## 2017-02-11 LAB — CBC: Lab: 5.3 K/UL — ABNORMAL LOW (ref 4.5–11.0)

## 2017-02-11 MED ORDER — OXYCODONE 5 MG PO TAB
5-10 mg | ORAL | 0 refills | Status: DC | PRN
Start: 2017-02-11 — End: 2017-02-12

## 2017-02-11 MED ORDER — MIDAZOLAM 1 MG/ML IJ SOLN
1 mg | Freq: Once | INTRAVENOUS | 0 refills | Status: DC
Start: 2017-02-11 — End: 2017-02-12

## 2017-02-11 MED ORDER — METOPROLOL TARTRATE 25 MG PO TAB
25 mg | Freq: Two times a day (BID) | ORAL | 0 refills | Status: DC
Start: 2017-02-11 — End: 2017-02-12
  Administered 2017-02-11 – 2017-02-12 (×3): 25 mg via ORAL

## 2017-02-11 MED ORDER — TRAMADOL 50 MG PO TAB
50 mg | ORAL | 0 refills | Status: DC | PRN
Start: 2017-02-11 — End: 2017-02-16
  Administered 2017-02-12 – 2017-02-14 (×2): 50 mg via ORAL

## 2017-02-11 MED ORDER — FENTANYL CITRATE (PF) 50 MCG/ML IJ SOLN
50 ug | Freq: Once | INTRAVENOUS | 0 refills | Status: AC
Start: 2017-02-11 — End: ?

## 2017-02-11 MED ORDER — OXYCODONE 5 MG PO TAB
5 mg | ORAL | 0 refills | Status: DC | PRN
Start: 2017-02-11 — End: 2017-02-16
  Administered 2017-02-12 – 2017-02-15 (×6): 5 mg via ORAL

## 2017-02-11 NOTE — Progress Notes
02/11/17 1110   Cardiac Rehab Activity   Distance Walked (feet) 40 ft   BP Pre-activity 126/52   BP Post-activity 139/65   HR Pre-activity 104 bpm   HR Post-activity 100   SaO2 Pre-activity 91 %   SaO2 Post-activity 92   O2 Device Nasal Cannula   O2 (lpm) 1 LPM   Comments Pt ambulated with a wheeled walker. Pt ambulated with a slow gait. Pt stopped for one seated rest break before ambulating back to the room. Pt tolerated ambulation well and was assisted back to bed.    Mobility   Progressive Mobility Level 8   Level of Assistance Assist X2   Assistive Device Walker   Time Tolerated 0-10 minutes   Activity Limited By Patient request to stop

## 2017-02-11 NOTE — Consults
Endocrinology Consult History and Physical   Name:  Calloway Sabella                                             MRN:  [id number]   Admission Date:  01/31/2017    Active Problems:    NSTEMI (non-ST elevated myocardial infarction) (HCC)    Type 2 diabetes mellitus with circulatory disorder, without long-term current use of insulin (HCC)    PAF (paroxysmal atrial fibrillation) (HCC)    Atrial fibrillation with rapid ventricular response (HCC)    Chronic gastrointestinal bleeding    Transfusion-dependent anemia    CAD (coronary artery disease)    HLD (hyperlipidemia)    Hypothyroidism    Hepatitis B    Hyperammonemia (HCC)    Obstructive pattern present on pulmonary function testing    Acute blood loss anemia    Thrombocytopenia (HCC)    Junctional rhythm    AKI (acute kidney injury) (HCC)    Acute respiratory failure with hypercapnia (HCC)    Nausea with vomiting, unspecified      Reason for Consult:     DM, HgbA1c 7 on orals at home, pt now s/p CABG/MAZE    Assessment / Plan     Type 2 Diabetes Mellitus, uncontrolled  Last HgbA1C 7.1 on 01/31/2017  Hemoglobin A1C   Date Value Ref Range Status   01/31/2017 7.1 (H) 4.0 - 6.0 % Final     Comment:     The ADA recommends that most patients with type 1 and type 2 diabetes maintain   an A1c level <7%.       No results found for: A1C  PTA regimen: Glimepiride 2 mg twice daily, metformin 1000 mg twice daily  Hypoglycemic episodes on this regimen: none  Follows up with PCP for diabetes management  Diabetic-complications assessment:   Retinopathy: no.    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 We will start on basal bolus for now. She would likely benefit from GLP-1 agonist at discharge and if Cr improves restarting home regimen would certainly help.    Patient stable for discharge on this regimen      Glucose, POC   Date/Time Value Ref Range Status   02/11/2017 0825 181 (H) 70 - 100 MG/DL Final   19/14/7829 5621 169 (H) 70 - 100 MG/DL Final   30/86/5784 6962 231 (H) 70 - 100 MG/DL Final  02/10/2017 1735 216 (H) 70 - 100 MG/DL Final   45/40/9811 9147 207 (H) 70 - 100 MG/DL Final   82/95/6213 0865 198 (H) 70 - 100 MG/DL Final   78/46/9629 5284 198 (H) 70 - 100 MG/DL Final   13/24/4010 2725 125 (H) 70 - 100 MG/DL Final         Darcel Bayley, MD  Pager [phone]     History of Present Illness:      Rashana Mayorquin is a 61 y.o. female with hypothyroidism, arthritis, CAD, hypertension, diabetes type 2, hyperlipidemia, hypertension who presented to [facility] on 01/31/2017 as a transfer from [place] for multivessel disease and need for CABG.  Patient previously on oral medications and now getting some insulin and endocrinology consult for diabetes management    Patient initially presented to [facility] prior to admission on 01/31/2017.  Patient presented to hospital in [facility] complaining of chest pain and had a hemoglobin of 6 was given a blood transfusion.  Troponins were elevated at that time and she underwent a cardiac catheterization that showed multivessel disease and was referred to [facility] for CABG.  Patient underwent CABG on 02/04/2017.  Patient was managed with oral medications prior to admission and at this point, postop day 5 patient is currently on a mid dose correction factor with glucose ranging anywhere from the 100s to the 200s.    Patient creatinine such that restarting home diabetic regimen is not an option. We will start on basal bolus for now. She would likely benefit from GLP-1 agonist at discharge and if Cr improves restarting home regimen would certainly help.      Past Medical History:   Diagnosis Date   ??? Acquired hypothyroidism    ??? Arthritis    ??? Back pain    ??? Bleeding disorder (HCC)    ??? Coronary artery disease    ??? Diabetes mellitus (HCC)     Type II   ??? Hypertension    ??? Stomach disorder    ??? Vision decreased      Past Surgical History:   Procedure Laterality Date   ??? HX HEART CATHETERIZATION  2017   ??? CORONARY STENT PLACEMENT  2017   ??? CORONARY ARTERY BYPASS GRAFT N/A 02/04/2017    CORONARY ARTERY BYPASS WITH ARTERIAL GRAFT - 4 GRAFTS (Internal Mammary Artery and Endovascular Vein Harvest) performed by Collene Schlichter, MD at [facility]   ??? HX MAZE N/A 02/04/2017    MAZE PROCEDURE performed by Collene Schlichter, MD at [facility]   ??? ANKLE SURGERY      Left ankle reconstruction   ??? COLONOSCOPY     ??? HX KNEE ARTHROSCOPY Left    ??? HX TONSIL AND ADENOIDECTOMY     ??? TUBAL LIGATION     ??? UPPER GASTROINTESTINAL ENDOSCOPY       Family History   Problem Relation Age of Onset   ??? Diabetes Mother    ??? Heart Disease Mother    ??? High Cholesterol Mother    ??? Arthritis-rheumatoid Mother    ??? Stroke Mother    ??? Thyroid Disease Mother    ??? Coronary Artery Disease Mother    ??? Hyperlipidemia Mother    ??? Cancer Father    ??? Cancer-Breast Sister    ??? Cancer Sister    ??? Hypertension Sister    ??? Heart Disease Sister    ??? High Cholesterol Sister    ??? Arthritis-osteo Sister    ??? Migraines Sister    ???  Rashes/Skin Problems Sister    ??? Depression Sister    ??? Hyperlipidemia Sister    ??? Cancer Brother    ??? Hypertension Brother    ??? Arthritis-rheumatoid Brother    ??? Rashes/Skin Problems Brother    ??? Coronary Artery Disease Brother    ??? Heart Disease Maternal Aunt ??? Coronary Artery Disease Paternal Uncle    ??? Hyperlipidemia Paternal Uncle    ??? Cancer Maternal Grandmother    ??? Diabetes Maternal Grandmother    ??? Heart Disease Maternal Grandmother    ??? Cancer-Colon Paternal Grandmother    ??? Cancer Paternal Grandmother    ??? Diabetes Paternal Grandmother    ??? Cancer Paternal Grandfather      Social History     Social History   ??? Marital status: Married     Spouse name: N/A   ??? Number of children: N/A   ??? Years of education: N/A     Social History Main Topics   ??? Smoking status: Former Smoker     Packs/day: 2.00     Years: 10.00     Types: Cigarettes     Quit date: 09/16/1980   ??? Smokeless tobacco: Never Used   ??? Alcohol use Yes      Comment: Seldom   ??? Drug use: No   ??? Sexual activity: Not on file     Other Topics Concern   ??? Not on file     Social History Narrative   ??? No narrative on file      Immunizations (includes history and patient reported): There is no immunization history for the selected administration types on file for this patient.        Allergies:  Diltiazem; Sulfa (sulfonamide antibiotics); Duricef [cefadroxil]; Pravastatin; and Simvastatin    Review of Systems:   Constitutional: negative for fevers, chills  Eyes: negative for visual disturbance  Ears, nose, mouth, throat, and face: change in hearing  Respiratory: negative for cough or SOB  Cardiovascular: negative for chest pain, palpitations  Gastrointestinal: negative for nausea, vomiting, abdominal pain  Genitourinary:negative for hematuria  Integument/breast: negative for rash  Musculoskeletal:negative for myalgias and arthralgias  Neurological: negative for headaches, dizziness and lightheadedness  Endocrine: negative for heat or cold intolerance    Physical Exam:      Vital Signs: Last Filed In 24 Hours Vital Signs: 24 Hour Range   BP: 128/59 (09/11 0430)  Temp: 36.6 ???C (97.9 ???F) (09/11 0430)  Pulse: 121 (09/11 0430)  Respirations: 16 PER MINUTE (09/11 0430)  SpO2: 94 % (09/11 0430) O2 Delivery: Nasal Cannula (09/11 0430) BP: (103-145)/(36-78)   Temp:  [36.6 ???C (97.9 ???F)-37.1 ???C (98.8 ???F)]   Pulse:  [52-121]   Respirations:  [16 PER MINUTE-20 PER MINUTE]   SpO2:  [88 %-96 %]   O2 Delivery: Nasal Cannula   Intensity Pain Scale (Self Report): 4 (02/10/17 2100)        General:  Alert, cooperative, no distress, appears stated age  Head:  Normocephalic  Eyes:  No scleral icterus  Neck:  Supple, symmetrical  Lungs:  CTA  Heart:    RRR  Abdomen:  S, NT, + BS  Skin:   No rashes  Neurologic:  Alert      Medications:  No current facility-administered medications on file prior to encounter.      Current Outpatient Prescriptions on File Prior to Encounter   Medication Sig Dispense Refill   ??? enalapril (VASOTEC) 5 mg tablet Take 5 mg  by mouth daily.     ??? folic acid (FOLVITE) 1 mg tablet Take 1 mg by mouth daily.     ??? furosemide (LASIX) 20 mg tablet Take 20 mg by mouth every morning.     ??? glimepiride (AMARYL) 2 mg tablet Take 2 mg by mouth twice daily.     ??? GLUCOSAMINE HCL/CHONDROITIN SU (GLUCOSAMINE-CHONDROITIN PO) Take 1 tablet by mouth twice daily.     ??? levothyroxine (SYNTHROID) 175 mcg tablet Take 175 mcg by mouth daily 30 minutes before breakfast.     ??? metFORMIN (GLUCOPHAGE) 500 mg tablet Take 1,000 mg by mouth twice daily with meals.     ??? metoprolol XL (TOPROL XL) 100 mg extended release tablet Take 100 mg by mouth twice daily.     ??? nitroglycerin (NITROSTAT) 0.4 mg tablet Place 0.4 mg under tongue every 5 minutes as needed for Chest Pain. Max of 3 tablets, call 911.     ??? omeprazole DR(+) (PRILOSEC) 40 mg capsule Take 40 mg by mouth twice daily.     ??? ondansetron (ZOFRAN) 8 mg tablet Take 8 mg by mouth every 8 hours as needed for Nausea or Vomiting.     ??? promethazine (PHENERGAN) 25 mg tablet Take 25 mg by mouth every 6 hours as needed for Nausea or Vomiting.     ??? sucralfate (CARAFATE) 1 gram tablet Take 1 g by mouth four times daily. Take on an empty stomach. ??? traMADol (ULTRAM) 50 mg tablet Take 50 mg by mouth every 6 hours as needed for Pain.         Lab/Radiology/Other Diagnostic Tests:  24-hour labs:    Results for orders placed or performed during the hospital encounter of 01/31/17 (from the past 24 hour(s))   POC GLUCOSE    Collection Time: 02/10/17 12:40 PM   Result Value Ref Range    Glucose, POC 207 (H) 70 - 100 MG/DL   POC GLUCOSE    Collection Time: 02/10/17  5:35 PM   Result Value Ref Range    Glucose, POC 216 (H) 70 - 100 MG/DL   POC GLUCOSE    Collection Time: 02/10/17  9:18 PM   Result Value Ref Range    Glucose, POC 231 (H) 70 - 100 MG/DL   CBC    Collection Time: 02/11/17  4:30 AM   Result Value Ref Range    White Blood Cells 5.3 4.5 - 11.0 K/UL    RBC 2.62 (L) 4.0 - 5.0 M/UL    Hemoglobin 7.9 (L) 12.0 - 15.0 GM/DL    Hematocrit 16.1 (L) 36 - 45 %    MCV 92.4 80 - 100 FL    MCH 30.2 26 - 34 PG    MCHC 32.7 32.0 - 36.0 G/DL    RDW 09.6 (H) 11 - 15 %    Platelet Count 168 150 - 400 K/UL    MPV 8.0 7 - 11 FL   BASIC METABOLIC PANEL    Collection Time: 02/11/17  4:30 AM   Result Value Ref Range    Sodium 133 (L) 137 - 147 MMOL/L    Potassium 3.5 3.5 - 5.1 MMOL/L    Chloride 96 (L) 98 - 110 MMOL/L    CO2 30 21 - 30 MMOL/L    Anion Gap 7 3 - 12    Glucose 172 (H) 70 - 100 MG/DL    Blood Urea Nitrogen 27 (H) 7 - 25 MG/DL    Creatinine 0.45 (H)  0.4 - 1.00 MG/DL    Calcium 9.1 8.5 - 16.1 MG/DL    eGFR Non African American 37 (L) >60 mL/min    eGFR African American 45 (L) >60 mL/min   POC GLUCOSE    Collection Time: 02/11/17  4:35 AM   Result Value Ref Range    Glucose, POC 169 (H) 70 - 100 MG/DL   POC GLUCOSE    Collection Time: 02/11/17  8:25 AM   Result Value Ref Range    Glucose, POC 181 (H) 70 - 100 MG/DL     Glucose: (!) 096 (04/54/09 0430)  POC Glucose (Download): (!) 181 (02/11/17 0825)  Pertinent radiology reviewed.

## 2017-02-11 NOTE — Progress Notes
OCCUPATIONAL THERAPY  NO TREATMENT NOTE     Chart reviewed and patient leaving room with transport upon arrival. RN reported patient is going to IR at this time for chest tube placement.     Occupational therapy will continue to follow and provide intervention, as indicated.    Therapist: Cristela Blue, OTS  Date: 02/11/2017

## 2017-02-11 NOTE — Progress Notes
Assumed care at 1900. Assessment complete and documented per Doc Flowsheet. ST on tele. VSS per pt. trend. AOx4. Pt. denies pain. Incisions C/D/I. No needs at this time. Call light within reach, will continue to monitor.     2000: Pt. appeared to be in AFlutter on tele with difficult to find P waves. 12 lead EKG obtained and shows ST.     2300: CTS paged regarding pt. in AFlutter on 12 lead EKG. VSS. Pt. HR 100-120s. Pt. not on BB or Amio for post op Junctional. S. Hemmings, APRN told RN to let her know if pt. gets more tachy but to watch for now.     No acute events overnight. Pt. HR remains 100-120. Pt. appeared to rest well and hard. Pt. is moving better and becoming more independent. Pt. had adequate urine output for shift. RN will continue to monitor.

## 2017-02-11 NOTE — Consults
CLINICAL NUTRITION                                                         NAME:Diane Savage             MRN: [id number]             DOB:09-14-1955          AGE: 61 y.o.  ADMISSION DATE: 01/31/2017             DAYS ADMITTED: LOS: 11 days    Nutrition Assessment of Patient:  Malnutrition Assessment: Does not meet criteria  Estimated Calorie Needs: 8119-1478  Estimated Protein Needs: 82     Clinical Nutrition Education Summary    Cardiac Diet Nutrition Education    Education on Cardiac/ ADA Diet was provided.  Topics included the following:   ? Indications for diet  ? Serum lipid levels???recent results and goals  ? Saturated fat, trans fat and cholesterol (daily limits)  ? Healthy fats and oils  ? Foods recommended/not recommended  ? Reading food labels  ? Healthy carbohydrates   ? Daily fiber goal  ? Omega 3 fatty acids  ? Plant sterol & stanols  ? Tips for cooking, grocery shopping , and eating out   ? Sodium limit of 2,000 milligrams daily  ? Sodium content of foods (examples)  ? Sodium-free seasonings  Written materials were provided.  Phone number was given for any further nutrition-related questions.    Comments:  Diane Savage is a 61 y.o. female with hypothyroidism, arthritis, CAD, hypertension, diabetes type 2, hyperlipidemia, hypertension who presented to [facility] on 01/31/2017 as a transfer from [place] for multivessel disease and need for CABG. S/p CABG 9/4. Consult received for diet education. A1C 7.1%. She reports fair appetite, getting bored of menu. Eating 80% meals per RN documentation. I reviewed diet recommendations with patient, including carb control at mealtimes, cooking more at home, and using well salt. Reviewed heart healthy fats, fiber-rich carbohydrates, and recommendations to increase both. Patient v/u of education, denies questions at this time. As previous diabetes RN has reported, patient does not appear engaged in this education visit.     Elsie Saas, RD, LD   Pager: [phone] Phone: [phone]

## 2017-02-11 NOTE — Progress Notes
Immediate CXR read per Dr Jackqulyn Livings. No pneumo noted, continue on low intermittent suction at this time. Pt able to transfer to floor for 1hr post CXR

## 2017-02-11 NOTE — Other
Procedure Note    Procedures    Immediate Post Procedure Note    Date:  02/11/2017                                         Attending Physician:  Cline Crock, MD    Consent:  Consent obtained from patient.  Time out performed: Consent obtained, correct patient verified, correct procedure verified, correct site verified, patient marked as necessary.  Pre/Post Procedure Diagnosis/Indication:  Left Pleural Effusion    Anesthesia: Local  Procedure(s):  Chest Tube Placement. Please see separate PACS dictation for further detail.  Findings: Large Left Pleural Effusion. Uncomplicated Chest tube Placement    Estimated Blood Loss:  None/Negligible  Specimen(s) Removed/Disposition:  Thin serosanguinous fluid.   Complications: None  Patient Tolerated Procedure: Well  Post-Procedure Condition:  Stable    Cline Crock MD      Dorthea Cove, MD

## 2017-02-11 NOTE — Progress Notes
Pt AOx4, intermittent confusion (baseline), VSS. 2L NC, Afib/aflutt on tele.  +BM, voiding adequately.  Incisional sites CDI, pain controlled with PRN tylenol & oxycodone.  Left CT to WS, sanguineous output.  Encouraging ambulation & use of the IS.  Up with 1-2 and a walker. HFR bundle in place.  Call light within reach. Will continue to monitor.

## 2017-02-11 NOTE — Progress Notes
IR Progress Note    Left chest tube placement, CTS requesting. CXR 9/11 shows large left pleural effusion. Patient s/p CABG on 9/4. Labs, meds, allergies okay.    Dorris Carnes, RN BSN

## 2017-02-11 NOTE — Progress Notes
PHYSICAL THERAPY  NOTE         Discussed with RN - patient off floor for chest tube placement. PT to follow up as appropriate.    Therapist: Warden Fillers, PT  Date: 02/11/2017

## 2017-02-11 NOTE — H&P (View-Only)
Pre Procedure History and Physical/Sedation Plan      Procedure Date:  02/11/2017    Planned Procedure(s): Left chest tube placement    Indication for exam: Pleural effusion  ________________________________________________________________    Chief Complaint:   See above     Previous Anesthetic/Sedation History:  Patient denies adverse event.      Allergies:  Diltiazem; Sulfa (sulfonamide antibiotics); Duricef [cefadroxil]; Pravastatin; and Simvastatin  Medications:  Scheduled Meds:  [MAR Hold] aspirin chewable tablet 81 mg 81 mg Oral QDAY   fentaNYL citrate PF (SUBLIMAZE) injection 50 mcg 50 mcg Intravenous ONCE   [MAR Hold] furosemide (LASIX) injection 40 mg 40 mg Intravenous BID(9-17)   [MAR Hold] heparin (porcine) injection 7,500 Units 0.75 mL 7,500 Units Subcutaneous Q8H   [MAR Hold] insulin aspart U-100 (NOVOLOG FLEXPEN) injection PEN 0-14 Units 0-14 Units Subcutaneous 5 X Day   [MAR Hold] insulin NPH (HUMULIN N KwikPen) injection PEN 12 Units 12 Units Subcutaneous QDAY w/breakfast   [MAR Hold] levothyroxine (SYNTHROID) tablet 175 mcg 175 mcg Oral QDAY(07)   [MAR Hold] lidocaine (LIDODERM) 5 % topical patch 1-2 patch 1-2 patch Topical QDAY   [MAR Hold] metoprolol tartrate (LOPRESSOR) tablet 25 mg 25 mg Oral BID   midazolam (VERSED) injection 1 mg 1 mg Intravenous ONCE   [MAR Hold] pantoprazole (PROTONIX) injection 40 mg 40 mg Intravenous BID(11-21)   [MAR Hold] polyethylene glycol 3350 (MIRALAX) packet 17 g 1 packet Oral QDAY   [MAR Hold] rosuvastatin (CRESTOR) tablet 40 mg 40 mg Oral QDAY   [MAR Hold] senna/docusate (SENOKOT-S) tablet 2 tablet 2 tablet Oral BID   Continuous Infusions:  PRN and Respiratory Meds:[MAR Hold] acetaminophen Q6H PRN **OR** [DISCONTINUED] acetaminophen Q6H PRN, [MAR Hold] albuterol 0.5% Q4H PRN, [MAR Hold] alum/mag hydroxide/simeth Q4H PRN, [MAR Hold] bisacodyl QDAY PRN, [MAR Hold] hydrALAZINE Q6H PRN, [MAR Hold] milk of magnesia (CONC) QDAY PRN, [MAR Hold] ondansetron Q6H PRN **OR** [MAR Hold] ondansetron (ZOFRAN) IV Q6H PRN, [MAR Hold] oxyCODONE Q4H PRN, [MAR Hold] potassium chloride SR PRN **OR** [MAR Hold] potassium chloride PRN **OR** [MAR Hold] potassium chloride in water PRN       Vital Signs:  Last Filed Vital Signs: 24 Hour Range   BP: 112/97 (09/11 1431)  Temp: 36.5 ???C (97.7 ???F) (09/11 1431)  Pulse: 106 (09/11 1431)  Respirations: 23 PER MINUTE (09/11 1431)  SpO2: 96 % (09/11 1431)  O2 Delivery: Nasal Cannula (09/11 1431) BP: (103-145)/(36-97)   Temp:  [36.4 ???C (97.5 ???F)-37.1 ???C (98.8 ???F)]   Pulse:  [52-121]   Respirations:  [16 PER MINUTE-23 PER MINUTE]   SpO2:  [88 %-98 %]   O2 Delivery: Nasal Cannula     Sedation/Medication Plan: Fentanyl and Midazolam  Personal history of sedation complications: Denies adverse event.   Family history of sedation complications: Denies adverse event.   Medications for Reversal: Naloxone and Flumazenil  Discussion/Reviews:  Physician has discussed risks and alternatives of this type of sedation and above planned procedures with patient    NPO Status: Acceptable  Airway:  airway assessment performed  Mallampati III (soft palate, base of uvula visible)  Head and Neck: no abnormalities noted  Mouth: no abnormalities noted   Anesthesia Classification:  ASA III (A patient with a severe systemic disease that limits activity, but is not incapacitating)  Pregnancy Status: Not Pregnant    Lab/Radiology/Other Diagnostic Tests  Labs:  Relevant labs reviewed    I have examined the patient, and there are no significant changes in their condition, from  the previous H&P performed on 02/01/17.    Toya Smothers, [facility]  Pager [phone]

## 2017-02-11 NOTE — Discharge Instructions - Pharmacy
Physician Discharge Summary    Name: Diane Savage  Medical Record Number: [id number]        Account Number:  [id number]  Date Of Birth:  17-Dec-1955                       Age:  61 years   Admit date:  01/31/2017                         Discharge date:  02/15/2017    Attending Physician:  Dr. Brock Bad               Service: Cardiothor Surg    Physician Summary completed by: Aurea Graff, PA-C    Reason for hospitalization:  Coronary Artery Disease    Significant PMH:   Past Medical History:   Diagnosis Date   ??? Acquired hypothyroidism    ??? Arthritis    ??? Back pain    ??? Bleeding disorder (HCC)    ??? Coronary artery disease    ??? Diabetes mellitus (HCC)     Type II   ??? Hypertension    ??? Stomach disorder    ??? Vision decreased      Allergies: Diltiazem; Sulfa (sulfonamide antibiotics); Duricef [cefadroxil]; Pravastatin; and Simvastatin    Brief Hospital Course:  The patient was admitted in transfer to Cardiology service for unstable angina and critical left main coronary artery disease.  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  She was scheduled as outpatient to follow up with Gastroenterology.  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  She will follow up with Hematology as outpatient.  She was started on antibiotics for urinary tract infection.    She was taken to the operating room on 02/04/17 under the surgical direction of Dr Farris Has.  Superficial wound vac was placed over sternal incision in the operating room.  She tolerated surgery and was transferred to the ICU.  She initially has vasogenic shock requiring IV medication and had junctional rhythm.  She was diuresed and was transferred to the floor on POD #4.    She developed an atrial tachycardia with concern for bursts of afib and was started on a beta blocker to improve rate.  Anticoagulation was held with chronic GI bleeding.  She tolerated aspirin and subcutaneous heparin for DVT ppx.  PT and OT were consulted to assist with ambulation.  She was able to walk 40'.  PT and OT were consulted.  EP was consulted for assistance with rhythm.  She was also seen by dietician and nurse diabetic educator.  She developed a left pleural effusion and pigtail was placed in IR for drainage.      She was experiencing intermittent confusion and forgetfulness.  Her husband and family reported that these symptoms were present before surgery.  Her narcotics were limited.      She slowly increased her activity and oral intake.  She was able for discharge home POD#11. She was able to ambulate over 363ft.      Condition at Discharge: Stable    Discharge Diagnoses:      Hospital Problems        Active Problems    Type 2 diabetes mellitus with circulatory disorder, without long-term current use of insulin (HCC)    PAF (paroxysmal atrial fibrillation) (HCC)    Atrial fibrillation with rapid ventricular response (HCC)  Chronic gastrointestinal bleeding    Transfusion-dependent anemia    CAD (coronary artery disease)    HLD (hyperlipidemia)    Hypothyroidism    Hepatitis B    Obstructive pattern present on pulmonary function testing    Acute blood loss anemia    Thrombocytopenia (HCC)    Junctional rhythm    AKI (acute kidney injury) (HCC)    Hypertension       Resolved Problems    RESOLVED: Vasogenic shock (HCC)    RESOLVED: Respiratory acidosis    RESOLVED: Metabolic acidosis    RESOLVED: NSTEMI (non-ST elevated myocardial infarction) (HCC)    RESOLVED: Hyperammonemia (HCC)    RESOLVED: Acute respiratory failure with hypercapnia (HCC)    RESOLVED: Nausea with vomiting, unspecified        Surgical Procedures:   1. Coronary artery bypass times 4 with endoscopic vein harvesting (left internal mammary artery anastomosed to the LAD, reversed saphenous vein graft anastomosed in sequence to the obtuse marginal and left posterolateral arteries, and reversed saphenous vein graft anastomosed to the posterior descending branch of the right coronary artery).   2. Bilateral modified CryoMaze procedure (pulmonary vein isolation).   3. Suture ligation of left atrial appendage.    Significant Diagnostic Studies and Procedures: noted in brief hospital course    Consults:  Endocrinology, GI, Hematology and Neuropsychology    Patient Disposition: Home       Patient instructions/medications:     Other Activity Restrictions   -You should and need to walk daily. Your goal is to walk 30 minutes at at time without stopping for breaks. This is a daily exercise routine that you should start as soon as you arrive home. Your basic daily activities do not count toward your 30 minute minimum, e.g. housework, toileting, fixing meals. Do not exercise outside in extremely hot or cold temperatures.  -Bathing: NO tub baths, hot tubs, or swimming for 6 weeks. You may shower at any time.  -Driving: NO driving for 2 weeks or while taking narcotics  -Lifting: NO lifting more than 10 pounds (gallon of milk) for 6 weeks.  -Monitoring: If you have access to a home blood pressure cuff, record your blood pressure and heart rate daily.   Please call if your systolic blood pressure is <90 or >160, OR  if your heart rate is <50 or >120 at rest     Activity as Tolerated   It is important to keep increasing your activity level after you leave the hospital.  Moving around can help prevent blood clots, lung infection (pneumonia) and other problems.  Gradually increasing the number of times you are up moving around will help you return to your normal activity level more quickly.  Continue to increase the number of times you are up to the chair and walking daily to return to your normal activity level.  You should resume your normal activity in 6 week(s).     Report These Signs and Symptoms   Please contact your doctor for the following symptoms:  *Temperature over 100 degrees F  *Uncontrolled pain  *Drainage with a foul odor  *Shortness of breath *Racing or skipping heart beats  *Popping or clicking of your breastbone  *Suture material sticking up through your incisions  *Change in coordination of ability to talk  *If you gain more than 2 pounds in 24 hours, or 5 pounds in one week.     Questions About Your Stay   For questions or concerns regarding your hospital  stay. Call [phone]   Discharging attending physician: Collene Schlichter [[id number]]      Cardiac Diet   Limiting unhealthy fats and cholesterol is the most important step you can take in reducing your risk for cardiovascular disease. Unhealthy fats include saturated and trans fats. Monitor your sodium and cholesterol intake. Restrict your sodium to 2g (grams) or 2000mg  (milligrams) daily, and your cholesterol to 200mg  daily.    If you have questions regarding your diet at home, you may contact a dietitian at [phone].     Diabetic Diet   You should eat between 1600 and 2000 calories per day.  This is equal to 60g (grams) of carbohydrates per meal, and 30g of carbohydrates for a bedtime snack.  If you have questions about your diet after you go home, you can call a dietitian at [phone].     Incision Care   *Keep your incision clean and dry.  *May shower daily.     *Do not submerge incision in tub, pool, hot tub, or lake for 4 weeks.  *Your incision should gradually look better each day. If you notice unusual swelling, redness, drainage, have increasing pain at the site, or have a fever greater than 100 degrees, notify your physician immediately.     Return Appointment   [place] Provider Collene Schlichter [[id number]]    Location [facility]    Appointment date: 03/12/2017    Appointment time: 1:30 PM      Return Appointment   You will need to call to schedule appointment to be seen in 1 week.   [place] Provider Sherre Lain [[id number]]      Return Appointment   [phone]  You will need to follow up with Cardiologist in 3-4 weeks.   Outside Provider Dr. Ruffin Frederick      Cardiac Rehab Your physician has referred you to outpatient cardiac rehab. Contact [facility] Cardiac Rehab Department at [phone] to schedule an appointment.     Return Appointment   Please make appointment and see your PCP within 7-10 days of discharge for diabetes management and blood pressure   Outside Provider your Primary care doctor         Current Discharge Medication List       START taking these medications    Details   acetaminophen (TYLENOL) 325 mg tablet Take two tablets by mouth every 6 hours as needed.  Refills: 0    PRESCRIPTION TYPE:  OTC      aspirin 81 mg chewable tablet Chew one tablet by mouth daily. Take with food.  Qty: 90 tablet, Refills: 3    PRESCRIPTION TYPE:  OTC      insulin aspart U-100 (NOVOLOG) 100 unit/mL injection Inject four Units under the skin three times daily with meals.  Qty: 10 mL, Refills: 30    PRESCRIPTION TYPE:  Normal      insulin NPH (HUMULIN N NPH U-100 INSULIN) 100 unit/mL injection Inject fourteen Units under the skin every morning.  Qty: 10 mL, Refills: 30    PRESCRIPTION TYPE:  Normal      metoprolol tartrate (LOPRESSOR) 50 mg tablet Take one tablet by mouth twice daily.  Qty: 30 tablet, Refills: 1    PRESCRIPTION TYPE:  Normal      rosuvastatin (CRESTOR) 40 mg tablet Take one tablet by mouth daily.  Qty: 90 tablet, Refills: 3    PRESCRIPTION TYPE:  Normal      senna/docusate (SENOKOT-S) 8.6/50 mg tablet Take  two tablets by mouth twice daily.  Qty: 20 tablet, Refills: 0    PRESCRIPTION TYPE:  Normal      triamterene-hydrochlorothiazide (MAXZIDE-25MG ) 37.5-25 mg tablet Take one-half tablet by mouth every morning for 14 days.  Qty: 7 tablet, Refills: 0    PRESCRIPTION TYPE:  Normal          CONTINUE these medications which have been CHANGED or REFILLED    Details   traMADol (ULTRAM) 50 mg tablet Take one tablet by mouth every 6 hours as needed for Pain.  Qty: 30 tablet, Refills: 0    PRESCRIPTION TYPE:  Print CONTINUE these medications which have NOT CHANGED    Details   cetirizine (ZYRTEC) 10 mg tablet Take 10 mg by mouth every morning.    PRESCRIPTION TYPE:  Historical Med      folic acid (FOLVITE) 1 mg tablet Take 1 mg by mouth daily.    PRESCRIPTION TYPE:  Historical Med      GLUCOSAMINE HCL/CHONDROITIN SU (GLUCOSAMINE-CHONDROITIN PO) Take 1 tablet by mouth twice daily.    PRESCRIPTION TYPE:  Historical Med      levothyroxine (SYNTHROID) 175 mcg tablet Take 175 mcg by mouth daily 30 minutes before breakfast.    PRESCRIPTION TYPE:  Historical Med      nitroglycerin (NITROSTAT) 0.4 mg tablet Place 0.4 mg under tongue every 5 minutes as needed for Chest Pain. Max of 3 tablets, call 911.    PRESCRIPTION TYPE:  Historical Med      omeprazole DR(+) (PRILOSEC) 40 mg capsule Take 40 mg by mouth twice daily.    PRESCRIPTION TYPE:  Historical Med      ondansetron (ZOFRAN) 8 mg tablet Take 8 mg by mouth every 8 hours as needed for Nausea or Vomiting.    PRESCRIPTION TYPE:  Historical Med      sucralfate (CARAFATE) 1 gram tablet Take 1 g by mouth four times daily. Take on an empty stomach.    PRESCRIPTION TYPE:  Historical Med          The following medications were removed from your list. This list includes medications discontinued this stay and those removed from your prior med list in our system        diphenhydrAMINE (BENADRYL) 25 mg capsule        electrolyte GUT PEG (NULYTELY, COLYTE, GAVILYTE-N) 420 gram oral solution        enalapril (VASOTEC) 5 mg tablet        furosemide (LASIX) 20 mg tablet        glimepiride (AMARYL) 2 mg tablet        LOPERAMIDE HCL (ANTI-DIARRHEA PO)        metFORMIN (GLUCOPHAGE) 500 mg tablet        metoprolol XL (TOPROL XL) 100 mg extended release tablet        promethazine (PHENERGAN) 25 mg tablet               Scheduled appointments:    Mar 12, 2017  1:30 PM CDT  Post - Op with Collene Schlichter, MD  [facility]) [facility] [id number]  [address] [place] [address]  [phone]   Mar 17, 2017  1:15 PM CDT  (Arrive by 1:00 PM)  Procedure with LABSW  [facility] - OP Lab (--) [address]  [place] [address]  [phone]   Mar 17, 2017  1:45 PM CDT  [facility] Arms  by 1:30 PM)  Return Patient with Sammuel Bailiff, MD  [facility] - OP Exam [facility] Exam) [address]  [place] [address]  [phone]   Apr 16, 2017  3:30 PM CST  Return Patient with Aquilla Hacker of [facility] Internal Medicine [facility]) [facility]  [address] [place] [address]  [phone]        To make an appointment and follow up with her PCP in 7-10 days for diabetes management and blood pressure check       Signed:  Aurea Graff, PA-C  02/12/2017      cc:  Primary Care Physician:  Sherre Lain     Referring physicians:  Self, Referral

## 2017-02-11 NOTE — Progress Notes
Dr Chester Holstein to bedside. Pt with pain at low intermediate suction. Per Md Renaee Munda okay to leave chest tube at water seal at this time. Aware 1200 ml pulled off chest tube at present

## 2017-02-11 NOTE — Case Management (ED)
Case Management Progress Note    NAME:Diane Savage                          MRN: [id number]              DOB:1955-12-29          AGE: 61 y.o.  ADMISSION DATE: 01/31/2017             DAYS ADMITTED: LOS: 11 days      Todays Date: 02/11/2017    Plan  Continue inpt care and dc planning- pt is POD 7 CABG. IR today for placement of chest tube related to pleural effusion.     Interventions  ? Support   Support: Pt/Family Updates re:POC or DC Plan, Patient Education  ? Info or Referral   Information or Referral to Community Resources: No Needs Identified  ? Discharge Planning   EMR and POC reviewed   PT/OT rec's anticipate pt will be able to dc home w family assist.   Endo following and rec's for NPH and Aspart 2 u TID w meals in place- DM Educator consulted and provided pt w info on insulin vials and how she can obtain them at [facility] for $ 25. Pt already has a glucometer in home pta and is a retired Economist with drawing up insulin/needles/administration.    CM team to continue to follow in dc planning/case progression and intervene as needed.   ? Medication Needs   Medication Needs: No Needs Identified  ? Financial   Financial: No Needs Identified  ? Legal   Legal: No Needs Identified  ? Other   Other/None: No needs identified    Disposition  ? Expected Discharge Date    Expected Discharge Date: 02/14/17  ? Transportation   Does the patient need discharge transport arranged?: No  Transportation Name, Phone and Availability #1: Jill-Daughter- [phone]  Does the patient use Medicaid Transportation?: No  ? Discharge Disposition   Home w assist and new insulin needs.     Vanita Ingles BSN, RN  Integrated Nurse Case Manager  Inpatient Cardiothoracic Surgery   M-F [phone]  O: [phone]  Pg: [phone]

## 2017-02-12 DIAGNOSIS — I214 Non-ST elevation (NSTEMI) myocardial infarction: Secondary | ICD-10-CM

## 2017-02-12 LAB — POC GLUCOSE
Lab: 343 mg/dL — ABNORMAL HIGH (ref >60)
Lab: 302 mg/dL — ABNORMAL HIGH (ref 70–100)
Lab: 164 mg/dL — ABNORMAL HIGH (ref 70–100)
Lab: 226 mg/dL — ABNORMAL HIGH (ref 70–100)
Lab: 128 mg/dL — ABNORMAL HIGH (ref 70–100)

## 2017-02-12 LAB — BASIC METABOLIC PANEL
Lab: 137 MMOL/L — ABNORMAL LOW (ref 137–147)
Lab: 97 MMOL/L — ABNORMAL LOW (ref 98–110)

## 2017-02-12 LAB — THYROID STIMULATING HORMONE-TSH: Lab: 2.2 uU/mL — ABNORMAL HIGH (ref 0.35–5.00)

## 2017-02-12 LAB — FREE T4 (FREE THYROXINE) ONLY: Lab: 1.5 ng/dL — ABNORMAL LOW (ref 0.6–1.6)

## 2017-02-12 LAB — URINALYSIS DIPSTICK REFLEX TO CULTURE
Lab: NEGATIVE
Lab: NEGATIVE
Lab: NEGATIVE
Lab: NEGATIVE

## 2017-02-12 LAB — URINALYSIS MICROSCOPIC REFLEX TO CULTURE

## 2017-02-12 LAB — CBC: Lab: 5.9 K/UL — ABNORMAL HIGH (ref >60)

## 2017-02-12 MED ORDER — INSULIN ASPART 100 UNIT/ML SC FLEXPEN
4 [IU] | Freq: Three times a day (TID) | SUBCUTANEOUS | 0 refills | Status: DC
Start: 2017-02-12 — End: 2017-02-16

## 2017-02-12 MED ORDER — METOPROLOL TARTRATE 50 MG PO TAB
50 mg | Freq: Two times a day (BID) | ORAL | 0 refills | Status: DC
Start: 2017-02-12 — End: 2017-02-13
  Administered 2017-02-13: 01:00:00 50 mg via ORAL

## 2017-02-12 MED ORDER — METOPROLOL TARTRATE 25 MG PO TAB
25 mg | Freq: Once | ORAL | 0 refills | Status: CP
Start: 2017-02-12 — End: ?
  Administered 2017-02-12: 16:00:00 25 mg via ORAL

## 2017-02-12 MED ORDER — INSULIN ASPART 100 UNIT/ML SC FLEXPEN
2 [IU] | Freq: Three times a day (TID) | SUBCUTANEOUS | 0 refills | Status: DC
Start: 2017-02-12 — End: 2017-02-12

## 2017-02-12 MED ORDER — NPH INSULIN HUMAN RECOMB 100 UNIT/ML (3 ML) SC PEN
16 [IU] | Freq: Every day | SUBCUTANEOUS | 0 refills | Status: DC
Start: 2017-02-12 — End: 2017-02-13

## 2017-02-12 NOTE — Progress Notes
RT Adult Assessment Note    NAME:Diane Savage             MRN: [id number]             DOB:1955-12-28          AGE: 61 y.o.  ADMISSION DATE: 01/31/2017             DAYS ADMITTED: LOS: 12 days    RT Treatment Plan:  Protocol Plan: Medications  Albuterol: Neb PRN    Protocol Plan: Procedures  PEP Therapy: Q4h PEP While Awake  PAP: Q4h PAP While Awake  IPPB: Place a nursing order for "IS Q1h While Awake" for any of Lung Expansion indicators  Oxygen/Humidity: O2 to keep SpO2 > 92%  Monitoring: Pulse oximetry BID & PRN      Vital Signs:  Pulse: Pulse: 97  RR: Respirations: 18 PER MINUTE  SpO2: SpO2: 93 %  O2 Device: $$ O2 Device: Cannula  Liter Flow: O2 Liter Flow: 1 lpm  O2%:    Breath Sounds: All Breath Sounds: Clear (implies normal);Decreased  Respiratory Effort:

## 2017-02-12 NOTE — Progress Notes
OCCUPATIONAL THERAPY  PROGRESS NOTE    Patient Name: Diane Savage                   Room/Bed: HC420/01  Admitting Diagnosis: Chest Pain  NSTEMI (non-ST elevated myocardial infarction) [facility])    Past Medical History:   Diagnosis Date   ??? Acquired hypothyroidism    ??? Arthritis    ??? Back pain    ??? Bleeding disorder (HCC)    ??? Coronary artery disease    ??? Diabetes mellitus (HCC)     Type II   ??? Hypertension    ??? Stomach disorder    ??? 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  Precautions: Sternal Precautions;Falls  Pain / Complaints: Patient agrees to participate in therapy;Patient has no c/o pain;Patient demonstrates no signs of pain    Objective  Psychosocial Status: Willing and Cooperative to Participate    Home Living  Type of Home: House  Home Layout: One Level  Financial risk analyst / Tub: Psychologist, counselling;Tub/Shower Unit  Bathroom Toilet: Standard    Prior Function  Level Of Independence: Independent with ADLs and functional transfers;Independent with homemaking w/ ambulation  Lives With: Spouse  Receives Help From: None Needed  Other Function Comments: Patient reports she and spouse share responsibility for performing IADLs. Spouse can perform on his own if needed upon patient return home.    ADL's  Where Assessed: Chair  Grooming Assist: Stand By Assist  Grooming Deficits: Supervision/Safety;Wash/Dry Face;Teeth Care  Functional Transfer Assist: Stand By Assist  Functional Transfer Deficits: Supervision/Safety  Comment: Patient agreeable to ADLs near chair as she was nauseous. Patient required stand by assist for 2 sit/stand transfers to/from chair and during standing ADLs with chair behind for safety considerations.     Activity Tolerance  Endurance: 2/5 Tolerates 10-20 Minutes Exercise w/Multiple Rests  Sitting Balance: 4+/5 Moves/Returns Trunkal Midpoint 1-2 Inches in Multiple Planes    Cognition Overall Cognitive Status: WFL to Adequately Complete Self Care Tasks Safely    Assessment  Assessment: Decreased ADL Status;Decreased Endurance;Decreased Self-Care Trans;Decreased High-Level ADLs  Prognosis: Good  Goal Formulation: Patient  Comments: Patient is progressing towards therapy goals. At this time, patient was limited by nausea.    AM-PAC 6 Clicks Daily Activity Inpatient  Putting on and taking off regular lower body clothes?: A Little  Bathing (Including washing, rinsing, drying): A Lot  Toileting, which includes using toilet, bedpan, or urinal: A Lot  Putting on and taking off regular upper body clothing: A Little  Taking care of personal grooming such as brushing teeth: None  Eating meals?: None  Daily Activity Raw Score: 18  Standardized (t-scale) score: 38.66  CMS 0-100% Score: 46.65  CMS G Code Modifier: CK    Plan   Progress: Progressing Toward Goals  OT Frequency: 5x/week  OT Plan for Next Visit: LE dressing, toileting    ADL Goals  Patient Will Perform Grooming: Standing at Sink;w/ Mod Independent  Patient Will Perform LE Dressing: w/ Modified Independent  Patient Will Perform Toileting: w/ Modified Independent    Functional Transfer Goals  Pt Will Perform All Functional Transfers: Modified Independent    OT Discharge Recommendations  OT Discharge Recommendations: Home with family assist - likely pending mobility progression.  Equipment Recommendations: Shower Chair    Therapist: Siri Cole, OTS  Date: 02/12/2017

## 2017-02-12 NOTE — Progress Notes
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 Is a retired Charity fundraiser.     Today the patient with very slow response times and at times has a very blank look on face.       Bed Mobility: Supine to Sit: Minimal Assist    Transfer Type: Sit to Stand  Transfer: Assistance Level: To/From;Bed;Minimal Assist;Standby Assist;Moderate Assist  Transfer: Assistive Device: Nurse, adult  Transfers: Type Of Assistance: Materials engineer;For Safety Considerations    GAIT:  Gait Distance: 30 feet (1 seated rest break)    Gait: Assistance Level: Minimal Assist (2nd person utilized after seated rest break due to safety concerns)  Gait: Assistive Device: Roller Walker  Gait: Descriptors: Decreased foot clearance RLE;Decreased foot clearance LLE;No balance loss    The patient notes she needs to sit during ambulation, but then just stands in front of couch. Encouraged patient to start to turn to sit, but just stares at therapist - requires tactile cueing to complete task. Admits that she feels thinking is foggy. She notes sometimes my legs just give out, but patient's gait not indicative of such at this time.    ASSESSMENT/PROGRESS:  Assessment/Progress: I saw this patient when she was in the ICU and mental status and abilities appear much different this date than in prior sessions. She has a difficult time tracking in conversation and response times are slow. Notified RN. The patient was much more willing to work and was battling persistent nausea in ICU and was ambulating greater distances.  Thus, changed recommendation from Home with Assistance to Inpatient Setting.     AM-PAC 6 Clicks Basic Mobility Inpatient  Turning from your back to your side while in a flat bed without using bed rails: A Little  Moving from lying on your back to sitting on the side of a flatbed without using bedrails : A Little  Moving to and from a bed to a chair (including a wheelchair): A Little  Standing up from a chair using your arms (e.g. wheelchair, or bedside chair): A Little  To walk in hospital room: A Little  Climbing 3-5 steps with a railing: A Lot  Raw Score: 17  Standardized (T-scale) Score: 39.67  Basic Mobility CMS 0-100%: 43.83  CMS G Code Modifier for Basic Mobility: CK    GOALS:  Goals  Goal Formulation: With Patient  Time For Goal Achievement: 1 day, To, 5 days  Pt Will Go Supine To/From Sit: w/ Stand By Assist  Pt Will Transfer Sit to Stand: w/ Stand By Assist  Pt Will Ambulate: Greater than 200 Feet, w/ Walker, w/ Stand By Assist    PLAN:  Plan   Plan Frequency: 5 Days per Week   Plan for progession: Continue to encourage participation in sessions, would likely benefit from consistent therapist to track progression,  RECOMMENDATIONS:  PT Discharge Recommendations  PT Discharge Recommendations: Inpatient Setting    Therapist: Elvina Sidle, PT  Date: 02/12/2017

## 2017-02-12 NOTE — Progress Notes
02/12/17 1116   Cardiac Rehab Activity   Distance Walked (feet) 15 ft   BP Post-activity 126/43   HR Post-activity 102   SaO2 Post-activity 96   O2 Device Nasal Cannula   O2 (lpm) 1 LPM   Comments Attempted to ambulate with pt. Pt ambulated into restroom with CT to suction. After pt used restroom, pt requesting to return to bed. Attempted to have pt ambulate and pt declined and requested to return to bed. Pt reporitng pain at chest tube site as well as itchiness. RN aware   Mobility   Progressive Mobility Level 7   Level of Assistance Assist X2   Assistive Device Walker   Time Tolerated 0-10 minutes   Activity Limited By Patient request to stop

## 2017-02-12 NOTE — Progress Notes
Pt slept 4+ hours overnight.   VSS. Patient remained in atrial flutter with heart rate in the 90s.  A&Ox4, 2L by NC.  Left chest tube to -20 suction. 82ml sanguineous out this shift.  Pt voiding well. 1034ml+ out this shift. Pt missed the hat once.  Encouraging pt to do as much as she can on her own (PT/OT.)  Pain continues to be a problem but is controlled by the Tramadol and Oxycodone.   Pt had significant nausea last night controlled by the Zofran.

## 2017-02-12 NOTE — Progress Notes
Pt AOx4 with intermittent confusion (baseline), VSS, 2L NC, aflutter on tele.  BM 9/11, voiding adequately (inaccurate bc pt missed hat several times).  Incisional sites CDI. Pain controlled with PRN oxycodone & tylenol.  CT -20 CO suction. Shift output: 10 mL.  RN encouraged ambulation & sitting in chair. Pt refused ambulation with RN but sat in chair. pt walked with PT.  HFR bundle in place. Call light within reach. Will continue to monitor.

## 2017-02-12 NOTE — Progress Notes
Electrophysiology Progress Note    Admission Date: 01/31/2017  Today's Date: 02/12/2017  LOS: 12 days    Assessment & Plan   Diane Savage is a 61 y.o. female patient with the following problems:    Active Problems:    Type 2 diabetes mellitus with circulatory disorder, without long-term current use of insulin (HCC)    PAF (paroxysmal atrial fibrillation) (HCC)    Atrial fibrillation with rapid ventricular response (HCC)    Chronic gastrointestinal bleeding    Transfusion-dependent anemia    CAD (coronary artery disease)    HLD (hyperlipidemia)    Hypothyroidism    Hepatitis B    Obstructive pattern present on pulmonary function testing    Acute blood loss anemia    Thrombocytopenia (HCC)    Junctional rhythm    AKI (acute kidney injury) (HCC)    Hypertension    Assessment:  Hx paroxysmal atrial fibrillation post op MAZE/LAA ligation POD #8  - ECG early in post op course - sinus bradycardia with low amplitude in P waves  - went into AF/AFl 02/10/17  - PTA metoprolol 100 mg bid held on admission  - metoprolol 25 mg bid started this am  - digoxin 250 mcg daily started 02/01/17  - low dose heparin for now given chest tube  ???  CAD with severe TVD  - Cardiac cath: 80% LM, Ostial Lcx 80%, RCA with 50% followed by 70%  - Echo doppler 9/1 with preserved EF  - CABG: LIMA to LAD, rSVG to OM/PL branch, rSVG to PDA on 02/04/17    Pleural effusion  - s/p chest tube placement 02/11/17    Hypothyroidism  - TSH 0.25 with free T4 1.3 on 01/31/17  - will recheck thyroid studies now    Chronic transfusion dependent anemia / hx GI bleeding  - hgb 8.3 this am    DM-II  HTN  HLD      Recommendations:  Atrial fib/flutter not surprising given her hx of atrial arrhythmia's, recent surgery and now with chest tube placement.  Overall V-rates 90's to 100's.  Will give additional 25 mg metoprolol now and increased to 50 mg po bid starting tonight.  Patient with sinus brady earlier post-op with diminutive P-waves.  Will continue to follow along, hopeful to avoid PPM for tachybrady.  Could consider amio load, however given her absence of anticoagulation would like to avoid TEE.  Rate control seems reasonable and hope that atrial arrhythmia's improves as she recovers and gets further out from surgery.    Jene Every, NP-C (pgr [phone])  Heart Rhythm Management (pgr [phone])        Medicare attestation       Subjective:  Awake and alert this am.  Significant incisional pain related to chest tube.  Reports breathing improving but painful to take deep breaths.  Denies palpitations or fluttering.    Medications  Scheduled Meds:  aspirin chewable tablet 81 mg 81 mg Oral QDAY   furosemide (LASIX) injection 40 mg 40 mg Intravenous BID(9-17)   heparin (porcine) injection 7,500 Units 0.75 mL 7,500 Units Subcutaneous Q8H   insulin aspart U-100 (NOVOLOG FLEXPEN) injection PEN 0-14 Units 0-14 Units Subcutaneous 5 X Day   insulin aspart U-100 (NOVOLOG FLEXPEN) injection PEN 4 Units 4 Units Subcutaneous TID w/ meals   [START ON 02/13/2017] insulin NPH (HUMULIN N KwikPen) injection PEN 16 Units 16 Units Subcutaneous QDAY w/breakfast   levothyroxine (SYNTHROID) tablet 175 mcg 175 mcg Oral QDAY(07)   lidocaine (LIDODERM)  5 % topical patch 1-2 patch 1-2 patch Topical QDAY   metoprolol tartrate (LOPRESSOR) tablet 50 mg 50 mg Oral BID   pantoprazole (PROTONIX) injection 40 mg 40 mg Intravenous BID(11-21)   polyethylene glycol 3350 (MIRALAX) packet 17 g 1 packet Oral QDAY   rosuvastatin (CRESTOR) tablet 40 mg 40 mg Oral QDAY   senna/docusate (SENOKOT-S) tablet 2 tablet 2 tablet Oral BID   Continuous Infusions:  PRN and Respiratory Meds:acetaminophen Q6H PRN **OR** [DISCONTINUED] acetaminophen Q6H PRN, albuterol 0.5% Q4H PRN, alum/mag hydroxide/simeth Q4H PRN, bisacodyl QDAY PRN, hydrALAZINE Q6H PRN, milk of magnesia (CONC) QDAY PRN, ondansetron Q6H PRN **OR** ondansetron (ZOFRAN) IV Q6H PRN, oxyCODONE Q4H PRN, potassium chloride SR PRN **OR** potassium chloride PRN **OR** potassium chloride in water PRN, traMADol Q6H PRN    Objective                       Vital Signs: Last Filed                 Vital Signs: 24 Hour Range   BP: 128/51 (09/12 1539)  Temp: 36.8 ???C (98.2 ???F) (09/12 1539)  Pulse: 97 (09/12 1629)  Respirations: 18 PER MINUTE (09/12 1629)  SpO2: 93 % (09/12 1629)  O2 Delivery: Nasal Cannula (09/12 1539)  SpO2 Pulse: 105 (09/11 1645) BP: (121-132)/(45-61)   Temp:  [36.4 ???C (97.5 ???F)-36.8 ???C (98.2 ???F)]   Pulse:  [79-116]   Respirations:  [16 PER MINUTE-19 PER MINUTE]   SpO2:  [93 %-99 %]   O2 Delivery: Nasal Cannula   Intensity Pain Scale (Self Report): 6 (02/12/17 1120) Vitals:    02/10/17 0449 02/11/17 0547 02/12/17 0252   Weight: 115.8 kg (255 lb 6.4 oz) 113.3 kg (249 lb 12.8 oz) 110 kg (242 lb 9.6 oz)         Intake/Output Summary:  (Last 24 hours)    Intake/Output Summary (Last 24 hours) at 02/12/17 1644  Last data filed at 02/12/17 1500   Gross per 24 hour   Intake              690 ml   Output             1260 ml   Net             -570 ml           Body mass index is 41.62 kg/m???.    Physical Exam        GEN: no acute distress  HEENT: no JVD  CHEST: + chest tube in place, shallow breaths related to pain; clear to auscultated anteriorly   HEART: reg rhythm, nml rate; nml S1 & S2, no S3 or S4; no rub; no murmurs  ABD: soft  EXT: trace bipedal edema  NEURO: A&Ox3  SKIN: warm and dry; midsternal incision with edges well approximated - no erythema or drainage    Lab Review  Hematology:    Lab Results   Component Value Date    HGB 8.3 02/12/2017    HCT 25.0 02/12/2017    PLTCT 179 02/12/2017    WBC 5.9 02/12/2017    NEUT 65 02/10/2017    ANC 3.50 02/10/2017    ALC 0.80 02/10/2017    MONA 15 02/10/2017    AMC 0.80 02/10/2017    ABC 0.00 02/10/2017    MCV 92.2 02/12/2017    MCHC 33.4 02/12/2017    MPV 8.0 02/12/2017    RDW 17.1 02/12/2017   ,  Coagulation:    Lab Results   Component Value Date PTT 26.8 02/04/2017    INR 1.3 02/04/2017   , General Chemistry:    Lab Results   Component Value Date    NA 137 02/12/2017    K 3.6 02/12/2017    CL 97 02/12/2017    GAP 5 02/12/2017    BUN 22 02/12/2017    CR 1.25 02/12/2017    GLU 130 02/12/2017    CA 9.2 02/12/2017    ALBUMIN 3.9 02/07/2017    LACTIC 0.8 02/05/2017    OBSCA 1.17 02/05/2017    MG 2.6 02/10/2017    TOTBILI 0.5 02/07/2017    and Endocrine:   Lab Results   Component Value Date    TSH 0.250 01/31/2017     Telemetry  AFlutter with V-rates in the 90's to 100's      Michaelene Song, APRN-NP (pgr [phone])

## 2017-02-12 NOTE — Other
Critical result or procedure called (document test and value, and read back):  Left Pneumothorax  Time MD/NP Notified:  1945  MD/NP Name:  Benita Gutter  MD/NP Response/Orders Given:  Change chest tube to continuous suction (-20). Chest XR in morning. Medicate for pain as needed.

## 2017-02-12 NOTE — Progress Notes
I have reviewed and agree with the documentation by Oneal Deputy, RN unless otherwise documented.

## 2017-02-13 DIAGNOSIS — I214 Non-ST elevation (NSTEMI) myocardial infarction: Secondary | ICD-10-CM

## 2017-02-13 LAB — CBC: Lab: 7.1 K/UL — ABNORMAL HIGH (ref >60)

## 2017-02-13 LAB — BASIC METABOLIC PANEL
Lab: 137 MMOL/L — ABNORMAL LOW (ref 137–147)
Lab: 3.9 MMOL/L — ABNORMAL LOW (ref 3.5–5.1)
Lab: 34 MMOL/L — ABNORMAL HIGH (ref 21–30)
Lab: 6 g/dL (ref 3–12)
Lab: 97 MMOL/L — ABNORMAL LOW (ref 98–110)

## 2017-02-13 LAB — CULTURE-URINE W/SENSITIVITY

## 2017-02-13 LAB — POC GLUCOSE
Lab: 150 mg/dL — ABNORMAL HIGH (ref 70–100)
Lab: 146 mg/dL — ABNORMAL HIGH (ref 70–100)
Lab: 165 mg/dL — ABNORMAL HIGH (ref 70–100)
Lab: 222 mg/dL — ABNORMAL HIGH (ref 70–100)

## 2017-02-13 MED ORDER — IMS MIXTURE TEMPLATE
75 mg | Freq: Two times a day (BID) | ORAL | 0 refills | Status: DC
Start: 2017-02-13 — End: 2017-02-16
  Administered 2017-02-13 – 2017-02-15 (×10): 75 mg via ORAL

## 2017-02-13 MED ORDER — NPH INSULIN HUMAN RECOMB 100 UNIT/ML (3 ML) SC PEN
14 [IU] | Freq: Every day | SUBCUTANEOUS | 0 refills | Status: DC
Start: 2017-02-13 — End: 2017-02-16

## 2017-02-13 MED ORDER — FUROSEMIDE 10 MG/ML IJ SOLN
40 mg | INTRAVENOUS | 0 refills | Status: DC
Start: 2017-02-13 — End: 2017-02-14
  Administered 2017-02-13 – 2017-02-14 (×3): 40 mg via INTRAVENOUS

## 2017-02-13 MED ORDER — PANTOPRAZOLE 40 MG PO TBEC
40 mg | Freq: Two times a day (BID) | ORAL | 0 refills | Status: DC
Start: 2017-02-13 — End: 2017-02-16
  Administered 2017-02-14 – 2017-02-15 (×3): 40 mg via ORAL

## 2017-02-13 NOTE — Progress Notes
Cardiothoracic Surgery Progress Note     Diane Savage  Today's Date:  02/13/2017  Admission Date: 01/31/2017  LOS: 13 days    POD: 9    Subjective:     Procedure: CORONARY ARTERY BYPASS WITH ARTERIAL GRAFT - 4 GRAFTS (Internal Mammary Artery and Endovascular Vein Harvest): 33535 (CPT???)  MAZE PROCEDURE:       Active Problems:    Type 2 diabetes mellitus with circulatory disorder, without long-term current use of insulin (HCC)    PAF (paroxysmal atrial fibrillation) (HCC)    Atrial fibrillation with rapid ventricular response (HCC)    Chronic gastrointestinal bleeding    Transfusion-dependent anemia    CAD (coronary artery disease)    HLD (hyperlipidemia)    Hypothyroidism    Hepatitis B    Obstructive pattern present on pulmonary function testing    Acute blood loss anemia    Thrombocytopenia (HCC)    Junctional rhythm    AKI (acute kidney injury) (HCC)    Hypertension      Assessment/Plan:      Neuro ??? Confusion at times, not taking any pain medication, Tylenol prn pain.  Pt family reports confusion at home also, will get neuropsych eval today  CV ??? Afib/Aflutter 90s-100's, initially holding amio/BB for post-op JR, has h/o pAF. EP following. Was not on AC d/t GI bleeding. SBP 120-140's. Metoprolol increased to 50mg  BID yesterday. Cont ASA 81mg  & rosuvastatin.  Intra op TEE LVEF 60% with LVH.  Resp ??? On 1L O2, FEV1: 52%. Continue IS & aggressive pulm hygiene.  CXR: slight improvement of left pleural effusion with pigtail in place, bibasilar atelectasis, congestion. CT 68mL/24hrs. On -20 suction. Will D/C today.  Renal ??? CKD (baseline Creat 1.0-1.7), creat 1.9-->1.84-->1.45-->1.25-->1.38. UOP 841mL/24 hrs on Lasix 40mg  IV bid, Net -3.5L since admit, below POW. Cont pyridium. Will increase Lasix  GI - +BM 9/11, cont bowel regimen. H/o chronic GI bleeding. 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 F/u with GI as outpt for EGD and possible ablation, GI signed off. Continue Protonix BID per GI rec's. ID ??? Afebrile, WBC 7.1. Ecoli UTI pre op- completed rocephin abx. Repeat UA: no evidence of infection  Heme ??? Hb stable 8.6, Plt 212.  H/O chronic GI bleeding, has received multiple transfusions this admission, +Ab, SQ heparin for DVT ppx.  Hematology signed off.  FEN ???  H/o NIDDM, HbA1c 7.1 on Metformin and glimepiride PTA- PCP follows, BS 128-226 on NPH and SS novolog.  Endo following, PTA Synthroid.   Activity ??? OOBTC, unsteady gait with decreased attention span. Ambulate in halls as able, goal of at least TID. PT/OT- 15-30' yest with CR.  Disposition: Pt does not have insurance so will make placement difficult PT/OT feels like she may be limiting herself on activity. Possible rehab candidate. Appreciate Endo rec's, neuropsych eval today, appreciate EP recs, increase activity. D/C CT. Increase Lasix, encourage IS, add IPPB. Will discuss further with staff     Prophylaxis Review:  Lines:  No   Antibiotic Usage:  no  VTE:  Pharmacological prophylaxis; SQ Heparin and Mechanical prophylaxis; Foot pump  Urinary Catheter: No     , PA-C   02/13/2017       Subjective:       HPI:   Diane Savage is a 61 y.o. female with PMH significant for pA-fib, HTN, HLD, NIDDM, Chronic blood anemia, transfusion dependent anemia, CAD, hypothyroidism who presented to OSH with complains of chest pain. Patient states that she has had  chest pain for months. Troponin was elevated at 0.3.  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 She also developed A fib with RVR at OSH. Her Hgb at the OSH was 6.3 on arrival. Patient was transferred to [facility] for further care.  Anemia has been presents for years and has had an extensive GI work up including EGD, capsule and CE which were all negative. She was taken off of anticoagulation given her chronic anemia.  Cath revealed 80% LMCA stenosis and high-grade RCA lesion as well.  She presents today to the ICU S/P CABG x 4 with MAZE procedure. She remains in the ICU for acute hypercarbic respiratory failure and acute renal failure.  She was transferred to CTP yesterday, has had increasing confusion overnight    Objective:        Medications:  Scheduled Meds:    aspirin chewable tablet 81 mg 81 mg Oral QDAY   furosemide (LASIX) injection 40 mg 40 mg Intravenous BID(9-17)   heparin (porcine) injection 7,500 Units 0.75 mL 7,500 Units Subcutaneous Q8H   insulin aspart U-100 (NOVOLOG FLEXPEN) injection PEN 0-14 Units 0-14 Units Subcutaneous 5 X Day   insulin aspart U-100 (NOVOLOG FLEXPEN) injection PEN 4 Units 4 Units Subcutaneous TID w/ meals   insulin NPH (HUMULIN N KwikPen) injection PEN 14 Units 14 Units Subcutaneous QDAY w/breakfast   levothyroxine (SYNTHROID) tablet 175 mcg 175 mcg Oral QDAY(07)   lidocaine (LIDODERM) 5 % topical patch 1-2 patch 1-2 patch Topical QDAY   metoprolol tartrate (LOPRESSOR) tablet 50 mg 50 mg Oral BID   pantoprazole (PROTONIX) injection 40 mg 40 mg Intravenous BID(11-21)   polyethylene glycol 3350 (MIRALAX) packet 17 g 1 packet Oral QDAY   rosuvastatin (CRESTOR) tablet 40 mg 40 mg Oral QDAY   senna/docusate (SENOKOT-S) tablet 2 tablet 2 tablet Oral BID   Continuous Infusions:    PRN and Respiratory Meds:acetaminophen Q6H PRN **OR** [DISCONTINUED] acetaminophen Q6H PRN, albuterol 0.5% Q4H PRN, alum/mag hydroxide/simeth Q4H PRN, bisacodyl QDAY PRN, hydrALAZINE Q6H PRN, milk of magnesia (CONC) QDAY PRN, ondansetron Q6H PRN **OR** ondansetron (ZOFRAN) IV Q6H PRN, oxyCODONE Q4H PRN, potassium chloride SR PRN **OR** potassium chloride PRN **OR** potassium chloride in water PRN, traMADol Q6H PRN                       Vital Signs: Last Filed                  Vital Signs: 24 Hour Range   BP: 140/51 (09/13 0355)  Temp: 36.5 ???C (97.7 ???F) (09/13 0355)  Pulse: 98 (09/13 0355)  Respirations: 18 PER MINUTE (09/13 0355)  SpO2: 93 % (09/13 0356)  O2 Delivery: Nasal Cannula (09/13 0356) BP: (109-140)/(47-65) Temp:  [36.5 ???C (97.7 ???F)-36.8 ???C (98.2 ???F)]   Pulse:  [74-103]   Respirations:  [16 PER MINUTE-18 PER MINUTE]   SpO2:  [88 %-99 %]   O2 Delivery: Nasal Cannula   Intensity Pain Scale (Self Report): 6 (02/12/17 2250) Vitals:    02/11/17 0547 02/12/17 0252 02/13/17 0452   Weight: 113.3 kg (249 lb 12.8 oz) 110 kg (242 lb 9.6 oz) 109.9 kg (242 lb 3.2 oz)           Intake/Output Summary:  (Last 24 hours)    Intake/Output Summary (Last 24 hours) at 02/13/17 0655  Last data filed at 02/13/17 0452   Gross per 24 hour   Intake  840 ml   Output              810 ml   Net               30 ml         Physical Exam:         Neuro: Alert and oriented, intermittently confused   Cardiovascular: Irregularly irregular rhythm  Respiratory: LS CTA bil - diminished in the bases  GI: soft, NT, +BS  Extremities: trace BLE   Incisions: c/d/i    Laboratory:  LABS:  Recent Labs      02/11/17   0430  02/12/17   0450  02/12/17   1739  02/13/17   0356   NA  133*  137   --   137   K  3.5  3.6   --   3.9   CL  96*  97*   --   97*   CO2  30  35*   --   34*   GAP  7  5   --   6   BUN  27*  22   --   22   CR  1.45*  1.25*   --   1.38*   GLU  172*  130*   --   145*   CA  9.1  9.2   --   9.3   TSH   --    --   2.220   --        Recent Labs      02/11/17   0430  02/12/17   0450  02/13/17   0356   WBC  5.3  5.9  7.1   HGB  7.9*  8.3*  8.6*   HCT  24.2*  25.0*  25.7*   PLTCT  168  179  212      Estimated Creatinine Clearance: 52.6 mL/min (A) (based on SCr of 1.38 mg/dL (H)).  Vitals:    02/11/17 0547 02/12/17 0252 02/13/17 0452   Weight: 113.3 kg (249 lb 12.8 oz) 110 kg (242 lb 9.6 oz) 109.9 kg (242 lb 3.2 oz)      No results for input(s): PHART, PO2ART in the last 72 hours.    Invalid input(s): PC02A        Radiology and Other Diagnostic Procedures Review:    Reviewed

## 2017-02-13 NOTE — Case Management (ED)
Case Management Progress Note    NAME:Diane Savage                          MRN: [id number]              DOB:11-05-1955          AGE: 61 y.o.  ADMISSION DATE: 01/31/2017             DAYS ADMITTED: LOS: 13 days      Today???s Date: 02/13/2017    Plan  Dc planning ongoing.  Pt currently not appropriate for home setting at dc given current mentation limitations.    Per CTS team huddle, pt is s/p 9 from CABG x 4.  Pt continues with confusion and reportedly has confusion at baseline.  To have neuropsych eval today.  Currently requiring 1L O2.  CT in place and on - 20 suction.  Team preferring for pt to dc to I[facility] setting if able.    Pt is uninsured and therefore placement will be difficult to obtain.      Interventions  ? Support   Support: Pt/Family Updates re:POC or DC Plan, Patient Education  ? Info or Referral   Information or Referral to Community Resources: No Needs Identified  ? Discharge Planning   Discharge Planning: No Needs Identified     SW contacted Melissa at [facility] and discussed the possibility of them accepting pt (if she is deemed appropriate and having goals) given that she is a self pay patient.  Melissa notified SW that pt would be self pay but that she would discuss with her team once the PMR consult had been completed.    ? Medication Needs   Medication Needs: No Needs Identified  ? Financial   Financial: No Needs Identified  ? Legal   Legal: No Needs Identified  ? Other   Other/None: No needs identified    Disposition  ? Expected Discharge Date    Expected Discharge Date: 02/16/17  ? Transportation   Does the patient need discharge transport arranged?: No  Transportation Name, Phone and Availability #1: Jill[facility]Daughter- [phone]  Does the patient use Medicaid Transportation?: No  ? Next Level of Care (Acute Psych discharges only)      ? Discharge Disposition      Durable Medical Equipment     No service has been selected for the patient.      [place] [facility] No service has been selected for the patient.      [facility]     No service has been selected for the patient.      [facility]     No service has been selected for the patient.          Gweneth Dimitri, LMSW  Surgery - Cardiothoracic/Vascular  Social Work Case Manager  [phone]

## 2017-02-13 NOTE — Progress Notes
Assumed pt care at 1900.  VSS per trend, tolerating 2L NC, Aflutter on tele, A&Ox4 but does make random statements and is hard to follow commands occasionally,  Pain controlled with current regimen.  Denies n/v, SOA.  Incisions C/D/I. CT to -20 with no output during shift.  High fall risk bundle in place, call light within reach, will cont to monitor.    0630-No changes or acute events overnight. Will cont to monitor and handoff to day RN.t

## 2017-02-13 NOTE — Progress Notes
02/13/17 0840   Cardiac Rehab Activity   Distance Walked (feet) 120 ft   BP Pre-activity 128/50   BP Post-activity 129/52   HR Pre-activity 101 bpm   HR Post-activity 109   SaO2 Pre-activity 95 %   SaO2 Post-activity 95   O2 Device Nasal Cannula   O2 (lpm) 1 LPM   Comments Pt ambulated with a wheeled walker at a slow pace. Pt stopped for one seated rest break and then ambulated back to room. Pt did require step by step instructions each time she went to sit down.    Mobility   Progressive Mobility Level 8   Level of Assistance Assist X2   Assistive Device Walker   Time Tolerated 0-10 minutes   Activity Limited By Fatigue

## 2017-02-13 NOTE — Consults
Endocrinology Consult History and Physical   Name:  Angeliki Bugh                                             MRN:  [id number]   Admission Date:  01/31/2017    Active Problems:    Type 2 diabetes mellitus with circulatory disorder, without long-term current use of insulin (HCC)    PAF (paroxysmal atrial fibrillation) (HCC)    Atrial fibrillation with rapid ventricular response (HCC)    Chronic gastrointestinal bleeding    Transfusion-dependent anemia    CAD (coronary artery disease)    HLD (hyperlipidemia)    Hypothyroidism    Hepatitis B    Obstructive pattern present on pulmonary function testing    Acute blood loss anemia    Thrombocytopenia (HCC)    Junctional rhythm    AKI (acute kidney injury) (HCC)    Hypertension      Reason for Consult:     DM, HgbA1c 7 on orals at home, pt now s/p CABG/MAZE    Assessment / Plan     Type 2 Diabetes Mellitus, uncontrolled  Last HgbA1C 7.1 on 01/31/2017  Hemoglobin A1C   Date Value Ref Range Status   01/31/2017 7.1 (H) 4.0 - 6.0 % Final     Comment:     The ADA recommends that most patients with type 1 and type 2 diabetes maintain   an A1c level <7%.       No results found for: A1C  PTA regimen: Glimepiride 2 mg twice daily, metformin 1000 mg twice daily  Hypoglycemic episodes on this regimen: none  Follows up with PCP for diabetes management  Diabetic-complications assessment:   Retinopathy: no.    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 We will start on basal bolus for now. She would likely benefit from GLP-1 agonist at discharge and if Cr improves restarting home regimen would certainly help.    Patient stable for discharge on this regimen      Glucose, POC   Date/Time Value Ref Range Status   02/12/2017 2049 150 (H) 70 - 100 MG/DL Final   45/40/9811 9147 128 (H) 70 - 100 MG/DL Final   82/95/6213 0865 226 (H) 70 - 100 MG/DL Final   78/46/9629 5284 164 (H) 70 - 100 MG/DL Final   13/24/4010 2725 302 (H) 70 - 100 MG/DL Final   36/64/4034 7425 343 (  H) 70 - 100 MG/DL Final   56/43/3295 1884 134 (H) 70 - 100 MG/DL Final   16/60/6301 6010 310 (H) 70 - 100 MG/DL Final         Darcel Bayley, MD  Pager [phone]     History of Present Illness:      Paityn Brucker is a 61 y.o. female with hypothyroidism, arthritis, CAD, hypertension, diabetes type 2, hyperlipidemia, hypertension who presented to [facility] on 01/31/2017 as a transfer from [place] for multivessel disease and need for CABG.  Patient previously on oral medications and now getting some insulin and endocrinology consult for diabetes management    Patient initially presented to [facility] prior to admission on 01/31/2017.  Patient presented to hospital in [facility] complaining of chest pain and had a hemoglobin of 6 was given a blood transfusion.  Troponins were elevated at that time and she underwent a cardiac catheterization that showed multivessel disease and was referred to [facility] for CABG.  Patient underwent CABG on 02/04/2017.  Patient was managed with oral medications prior to admission and at this point, postop day 5 patient is currently on a mid dose correction factor with glucose ranging anywhere from the 100s to the 200s. Patient creatinine such that restarting home diabetic regimen is not an option. We will start on basal bolus for now. She would likely benefit from GLP-1 agonist at discharge and if Cr improves restarting home regimen would certainly help.      Past Medical History:   Diagnosis Date   ??? Acquired hypothyroidism    ??? Arthritis    ??? Back pain    ??? Bleeding disorder (HCC)    ??? Coronary artery disease    ??? Diabetes mellitus (HCC)     Type II   ??? Hypertension    ??? Stomach disorder    ??? Vision decreased      Past Surgical History:   Procedure Laterality Date   ??? HX HEART CATHETERIZATION  2017   ??? CORONARY STENT PLACEMENT  2017   ??? CORONARY ARTERY BYPASS GRAFT N/A 02/04/2017    CORONARY ARTERY BYPASS WITH ARTERIAL GRAFT - 4 GRAFTS (Internal Mammary Artery and Endovascular Vein Harvest) performed by Collene Schlichter, MD at [facility]   ??? HX MAZE N/A 02/04/2017    MAZE PROCEDURE performed by Collene Schlichter, MD at [facility]   ??? ANKLE SURGERY      Left ankle reconstruction   ??? COLONOSCOPY     ??? HX KNEE ARTHROSCOPY Left    ??? HX TONSIL AND ADENOIDECTOMY     ??? TUBAL LIGATION     ??? UPPER GASTROINTESTINAL ENDOSCOPY       Family History   Problem Relation Age of Onset   ??? Diabetes Mother    ??? Heart Disease Mother    ??? High Cholesterol Mother    ??? Arthritis-rheumatoid Mother    ??? Stroke Mother    ??? Thyroid Disease Mother    ??? Coronary Artery Disease Mother    ??? Hyperlipidemia Mother    ??? Cancer Father    ??? Cancer-Breast Sister    ??? Cancer Sister    ??? Hypertension Sister    ??? Heart Disease Sister    ??? High Cholesterol Sister    ??? Arthritis-osteo Sister    ??? Migraines Sister    ??? Rashes/Skin Problems Sister    ??? Depression Sister    ??? Hyperlipidemia Sister    ??? Cancer Brother    ??? Hypertension  Brother    ??? Arthritis-rheumatoid Brother    ??? Rashes/Skin Problems Brother    ??? Coronary Artery Disease Brother    ??? Heart Disease Maternal Aunt    ??? Coronary Artery Disease Paternal Uncle    ??? Hyperlipidemia Paternal Uncle ??? Cancer Maternal Grandmother    ??? Diabetes Maternal Grandmother    ??? Heart Disease Maternal Grandmother    ??? Cancer-Colon Paternal Grandmother    ??? Cancer Paternal Grandmother    ??? Diabetes Paternal Grandmother    ??? Cancer Paternal Grandfather      Social History     Social History   ??? Marital status: Married     Spouse name: N/A   ??? Number of children: N/A   ??? Years of education: N/A     Social History Main Topics   ??? Smoking status: Former Smoker     Packs/day: 2.00     Years: 10.00     Types: Cigarettes     Quit date: 09/16/1980   ??? Smokeless tobacco: Never Used   ??? Alcohol use Yes      Comment: Seldom   ??? Drug use: No   ??? Sexual activity: Not on file     Other Topics Concern   ??? Not on file     Social History Narrative   ??? No narrative on file      Immunizations (includes history and patient reported): There is no immunization history for the selected administration types on file for this patient.        Allergies:  Diltiazem; Sulfa (sulfonamide antibiotics); Duricef [cefadroxil]; Pravastatin; and Simvastatin    Review of Systems:   Constitutional: negative for fevers, chills  Eyes: negative for visual disturbance  Ears, nose, mouth, throat, and face: change in hearing  Respiratory: negative for cough or SOB  Cardiovascular: negative for chest pain, palpitations  Gastrointestinal: negative for nausea, vomiting, abdominal pain  Genitourinary:negative for hematuria  Integument/breast: negative for rash  Musculoskeletal:negative for myalgias and arthralgias  Neurological: negative for headaches, dizziness and lightheadedness  Endocrine: negative for heat or cold intolerance    Physical Exam:      Vital Signs: Last Filed In 24 Hours Vital Signs: 24 Hour Range   BP: 140/51 (09/13 0355)  Temp: 36.5 ???C (97.7 ???F) (09/13 0355)  Pulse: 98 (09/13 0355)  Respirations: 18 PER MINUTE (09/13 0355)  SpO2: 93 % (09/13 0356)  O2 Delivery: Nasal Cannula (09/13 0356) BP: (109-140)/(47-65) Temp:  [36.5 ???C (97.7 ???F)-36.8 ???C (98.2 ???F)]   Pulse:  [74-103]   Respirations:  [16 PER MINUTE-18 PER MINUTE]   SpO2:  [88 %-99 %]   O2 Delivery: Nasal Cannula   Intensity Pain Scale (Self Report): 6 (02/12/17 2250)        General:  Alert, cooperative, no distress, appears stated age  Head:  Normocephalic  Eyes:  No scleral icterus  Neck:  Supple, symmetrical  Lungs:  CTA  Heart:    RRR  Abdomen:  S, NT, + BS  Skin:   No rashes  Neurologic:  Alert      Medications:  No current facility-administered medications on file prior to encounter.      Current Outpatient Prescriptions on File Prior to Encounter   Medication Sig Dispense Refill   ??? enalapril (VASOTEC) 5 mg tablet Take 5 mg by mouth daily.     ??? folic acid (FOLVITE) 1 mg tablet Take 1 mg by mouth daily.     ??? furosemide (LASIX)  20 mg tablet Take 20 mg by mouth every morning.     ??? glimepiride (AMARYL) 2 mg tablet Take 2 mg by mouth twice daily.     ??? GLUCOSAMINE HCL/CHONDROITIN SU (GLUCOSAMINE-CHONDROITIN PO) Take 1 tablet by mouth twice daily.     ??? levothyroxine (SYNTHROID) 175 mcg tablet Take 175 mcg by mouth daily 30 minutes before breakfast.     ??? metFORMIN (GLUCOPHAGE) 500 mg tablet Take 1,000 mg by mouth twice daily with meals.     ??? metoprolol XL (TOPROL XL) 100 mg extended release tablet Take 100 mg by mouth twice daily.     ??? nitroglycerin (NITROSTAT) 0.4 mg tablet Place 0.4 mg under tongue every 5 minutes as needed for Chest Pain. Max of 3 tablets, call 911.     ??? omeprazole DR(+) (PRILOSEC) 40 mg capsule Take 40 mg by mouth twice daily.     ??? ondansetron (ZOFRAN) 8 mg tablet Take 8 mg by mouth every 8 hours as needed for Nausea or Vomiting.     ??? promethazine (PHENERGAN) 25 mg tablet Take 25 mg by mouth every 6 hours as needed for Nausea or Vomiting.     ??? sucralfate (CARAFATE) 1 gram tablet Take 1 g by mouth four times daily. Take on an empty stomach.     ??? traMADol (ULTRAM) 50 mg tablet Take 50 mg by mouth every 6 hours as needed for Pain. Lab/Radiology/Other Diagnostic Tests:  24-hour labs:    Results for orders placed or performed during the hospital encounter of 01/31/17 (from the past 24 hour(s))   POC GLUCOSE    Collection Time: 02/12/17  8:12 AM   Result Value Ref Range    Glucose, POC 164 (H) 70 - 100 MG/DL   POC GLUCOSE    Collection Time: 02/12/17 11:16 AM   Result Value Ref Range    Glucose, POC 226 (H) 70 - 100 MG/DL   POC GLUCOSE    Collection Time: 02/12/17  5:03 PM   Result Value Ref Range    Glucose, POC 128 (H) 70 - 100 MG/DL   URINALYSIS DIPSTICK REFLEX TO CULTURE    Collection Time: 02/12/17  5:39 PM   Result Value Ref Range    Color,UA AMBER     Turbidity,UA 1+ (A) CLEAR-CLEAR    Specific Gravity-Urine 1.014 1.003 - 1.035    pH,UA 5.0 5.0 - 8.0    Protein,UA NEG NEG-NEG    Glucose,UA NEG NEG-NEG    Ketones,UA NEG NEG-NEG    Bilirubin,UA NEG NEG-NEG    Blood,UA NEG NEG-NEG    Urobilinogen,UA NORMAL NORM-NORMAL    Nitrite,UA NEG NEG-NEG    Leukocytes,UA NEG NEG-NEG    Urine Ascorbic Acid, UA NEG NEG-NEG   URINALYSIS MICROSCOPIC REFLEX TO CULTURE    Collection Time: 02/12/17  5:39 PM   Result Value Ref Range    WBCs,UA 20-50 0 - 2 /HPF    RBCs,UA 2-10 0 - 3 /HPF    Comment,UA       Urine submitted for reflex culture if criteria are met:WBC>10, positive nitrite   and/or >=1+ leukocyte esterase. If quantity is not sufficient, an addendum will   follow.      MucousUA TRACE     Squamous Epithelial Cells 5-10 0 - 5    Hyaline Cast 10-20    THYROID STIMULATING HORMONE-TSH    Collection Time: 02/12/17  5:39 PM   Result Value Ref Range    TSH 2.220 0.35 - 5.00 MCU/ML  FREE T4 (FREE THYROXINE) ONLY    Collection Time: 02/12/17  5:39 PM   Result Value Ref Range    T4-Free 1.5 0.6 - 1.6 NG/DL   POC GLUCOSE    Collection Time: 02/12/17  8:49 PM   Result Value Ref Range    Glucose, POC 150 (H) 70 - 100 MG/DL   CBC    Collection Time: 02/13/17  3:56 AM   Result Value Ref Range    White Blood Cells 7.1 4.5 - 11.0 K/UL RBC 2.76 (L) 4.0 - 5.0 M/UL    Hemoglobin 8.6 (L) 12.0 - 15.0 GM/DL    Hematocrit 19.1 (L) 36 - 45 %    MCV 93.2 80 - 100 FL    MCH 31.1 26 - 34 PG    MCHC 33.4 32.0 - 36.0 G/DL    RDW 47.8 (H) 11 - 15 %    Platelet Count 212 150 - 400 K/UL    MPV 7.9 7 - 11 FL   BASIC METABOLIC PANEL    Collection Time: 02/13/17  3:56 AM   Result Value Ref Range    Sodium 137 137 - 147 MMOL/L    Potassium 3.9 3.5 - 5.1 MMOL/L    Chloride 97 (L) 98 - 110 MMOL/L    CO2 34 (H) 21 - 30 MMOL/L    Anion Gap 6 3 - 12    Glucose 145 (H) 70 - 100 MG/DL    Blood Urea Nitrogen 22 7 - 25 MG/DL    Creatinine 2.95 (H) 0.4 - 1.00 MG/DL    Calcium 9.3 8.5 - 62.1 MG/DL    eGFR Non African American 39 (L) >60 mL/min    eGFR African American 47 (L) >60 mL/min     Glucose: (!) 145 (02/13/17 0356)  POC Glucose (Download): (!) 150 (02/12/17 2049)  Pertinent radiology reviewed.

## 2017-02-13 NOTE — Progress Notes
OCCUPATIONAL THERAPY  PROGRESS NOTE    Patient Name: Diane Savage                   Room/Bed: HC420/01  Admitting Diagnosis: Chest Pain  NSTEMI (non-ST elevated myocardial infarction) [facility])    Past Medical History:   Diagnosis Date   ??? Acquired hypothyroidism    ??? Arthritis    ??? Back pain    ??? Bleeding disorder (HCC)    ??? Coronary artery disease    ??? Diabetes mellitus (HCC)     Type II   ??? Hypertension    ??? Stomach disorder    ??? Vision decreased      Mobility  Progressive Mobility Level: Walk in hallway  Level of Assistance: Assist X2  Assistive Device: Walker  Activity Limited By: Nausea (Xray )    Subjective  Pertinent Dx per Physician: s/p CABG x4 on 9/4  Precautions: Sternal Precautions;Falls  Pain / Complaints: Patient agrees to participate in therapy;Patient has no c/o pain;Patient demonstrates no signs of pain    Objective  Psychosocial Status: Willing and Cooperative to Participate  Persons Present: Nursing Staff;Provider    Home Living  Type of Home: House  Home Layout: One Level  Financial risk analyst / Tub: Psychologist, counselling;Tub/Shower Unit  Bathroom Toilet: Standard    Prior Function  Level Of Independence: Independent with ADLs and functional transfers;Independent with homemaking w/ ambulation  Lives With: Spouse  Receives Help From: None Needed  Other Function Comments: Patient reports she and spouse share responsibility for performing IADLs. Spouse can perform on his own if needed upon patient return home.    ADL's  Where Assessed: Chair;Edge of Bed;Supine, Bed  Eating Assist: Independent  LE Dressing Assist: Maximum Assist  LE Dressing Deficits: Don/Doff R Sock;Don/Doff L Sock  Functional Transfer Assist: Minimal Assist  Functional Transfer Deficits: Steadying;Verbal Cueing;Supervision/Safety;Increased Time to Complete  Comment: Patient sitting in bedside chair upon therapy arrival. Patient required maximum assistant for LE dress. Patient reported she usually puts her foot on couch to dress. Required minimal assist of 1 and contact guard assist of 2nd person for sit/stand transfer to/from chair/EOB and steps to bed. Patient required extra time to process commands. Xray entered. Patient required minimal assist for sit>supine transfer. Patient in supine with Xray and nursing staff upon exit.     Activity Tolerance  Endurance: 2/5 Tolerates 10-20 Minutes Exercise w/Multiple Rests  Sitting Balance: 4+/5 Moves/Returns Trunkal Midpoint 1-2 Inches in Multiple Planes    Cognition  Overall Cognitive Status: WFL to Adequately Complete Self Care Tasks Safely  Social Interaction: Increased Time to Adjust  Problem Solving: Decreased Judgment/Safety    Assessment  Assessment: Decreased ADL Status;Decreased Endurance;Decreased Self-Care Trans;Decreased High-Level ADLs  Prognosis: Good  Goal Formulation: Patient  Comments: Patient's mental status appears changed this date compared to yesterday for occupational therapy. Patient now requires increased time to process commands and express needs. Patient is now needing increase in physical assist with mobility and ADLs.    AM-PAC 6 Clicks Daily Activity Inpatient  Putting on and taking off regular lower body clothes?: A Lot  Bathing (Including washing, rinsing, drying): A Lot  Toileting, which includes using toilet, bedpan, or urinal: A Lot  Putting on and taking off regular upper body clothing: A Lot  Taking care of personal grooming such as brushing teeth: None  Eating meals?: None  Daily Activity Raw Score: 16  Standardized (t-scale) score: 35.96  CMS 0-100% Score: 53.32  CMS G Code  Modifier: CK    Plan   Progress: Slow Progress, Decreased Activity Tolerance  OT Frequency: 5x/week  OT Plan for Next Visit: LE dress with leg on bed, toileting    ADL Goals  Patient Will Perform Grooming: Standing at Sink;w/ Mod Independent  Patient Will Perform LE Dressing: w/ Modified Independent  Patient Will Perform Toileting: w/ Modified Independent Functional Transfer Goals  Pt Will Perform All Functional Transfers: Modified Independent    OT Discharge Recommendations  OT Discharge Recommendations: Inpatient Setting  Equipment Recommendations: Shower Chair  Comments: Inpatient setting now recommended as patient continues to need physical assist with mobility and ADLs.    Therapist: Siri Cole, OTS  Date: 02/13/2017

## 2017-02-13 NOTE — Progress Notes
PHYSICAL THERAPY  PROGRESS NOTE       MOBILITY:  Progressive Mobility Level: Walk in hallway  Distance Walked (feet): 80 ft  Level of Assistance: Assist X1  Assistive Device: Walker  Time Tolerated: 11-30 minutes    SUBJECTIVE:  Significant hospital events: s/p CABG x4 on 9/4, Chest tube inserted  (9/11) - keep to continous suction, CT  pulled (9/13)    Comments: Room Air  Ambulation Assist: Independent Mobility in Community without Device  Patient Owned Equipment: 4-Wheeled Audiological scientist  Home Situation: Lives with Family  Type of Home: House  Entry Stairs: Ramp    The patient notes independence with ADLs and IADLs. Is a retired [employer] fundraiser.     BED MOBILITY/TRANSFERS:  Transfer Type: Sit to Stand  Transfer: Assistance Level: To/From;Bed  Transfer: Assistive Device: Nurse, adult    GAIT:  Gait Distance: 80 feet (2 seated rest breaks)  Gait: Assistance Level: Minimal Assist  Gait: Assistive Device: Roller Walker  Gait: Descriptors: Decreased foot clearance RLE;Decreased foot clearance LLE;No balance loss    Requires increased time for sitting rest break >5 minutes but vitals stable.    EDUCATION:  Persons Educated: Patient  Patient Barriers To Learning: None Noted  Interventions: Repetition of Instructions  Teaching Methods: Verbal Instruction  Patient Response: Verbalized Understanding  Topics: Plan/Goals of PT Interventions;Importance of Increasing Activity    ASSESSMENT/PROGRESS:  Assessment/Progress: The patient's mental status somewhat improved this date, but at times during mobility requires increase time to process and increased verbal and tactile cues to complete task. Discussed concern with patient, who notes Yea, I don't know why I do that.  The patient continues to complain of dizziness, but vitals remain stable and gait remains steady.     AM-PAC 6 Clicks Basic Mobility Inpatient  Turning from your back to your side while in a flat bed without using bed rails: A Little Moving from lying on your back to sitting on the side of a flatbed without using bedrails : A Little  Moving to and from a bed to a chair (including a wheelchair): A Little  Standing up from a chair using your arms (e.g. wheelchair, or bedside chair): A Little  To walk in hospital room: A Little  Climbing 3-5 steps with a railing: A Lot  Raw Score: 17  Standardized (T-scale) Score: 39.67  Basic Mobility CMS 0-100%: 43.83  CMS G Code Modifier for Basic Mobility: CK    GOALS:  Goal Formulation: With Patient  Time For Goal Achievement: 1 day, To, 5 days  Pt Will Go Supine To/From Sit: w/ Stand By Assist  Pt Will Transfer Sit to Stand: w/ Stand By Assist  Pt Will Ambulate: Greater than 200 Feet, w/ Dan Humphreys, w/ Stand By Assist    PLAN:  Plan Frequency: 5 Days per Week  Plan for progression:to encourage participation in sessions, would likely benefit from consistent therapist to track progression,       RECOMMENDATIONS:  PT Discharge Recommendations: Inpatient Setting    Therapist: Elvina Sidle, PT  Date: 02/13/2017

## 2017-02-13 NOTE — Progress Notes
Electrophysiology Progress Note    Admission Date: 01/31/2017  Today's Date: 02/13/2017  LOS: 13 days    Assessment & Plan   Diane Savage is a 61 y.o. female patient with the following problems:    Active Problems:    Type 2 diabetes mellitus with circulatory disorder, without long-term current use of insulin (HCC)    PAF (paroxysmal atrial fibrillation) (HCC)    Atrial fibrillation with rapid ventricular response (HCC)    Chronic gastrointestinal bleeding    Transfusion-dependent anemia    CAD (coronary artery disease)    HLD (hyperlipidemia)    Hypothyroidism    Hepatitis B    Obstructive pattern present on pulmonary function testing    Acute blood loss anemia    Thrombocytopenia (HCC)    Junctional rhythm    AKI (acute kidney injury) (HCC)    Hypertension    Assessment:  Hx paroxysmal atrial fibrillation post op MAZE/LAA ligation POD #8  - ECG early in post op course - sinus bradycardia with low amplitude in P waves  - went into AF/AFl 02/10/17  - PTA metoprolol 100 mg bid held on admission  - metoprolol 25 mg bid started 02/12/17, increased to 50mg  02/12/17 evening  - digoxin 250 mcg daily started 02/01/17  - low dose heparin for now given chest tube  ???  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  Will increase to 50 mg bid this am (was previously on 100 mg bid PTA).      Patient with sinus brady earlier post-op with diminutive P-waves.  Will continue to follow along, hopeful to avoid PPM for tachybrady.  Could consider amio load, however given her absence of anticoagulation would like to avoid TEE.  Rate control seems reasonable and hope that atrial arrhythmia's improves as she recovers and gets further out from surgery.    Jene Every, NP-C (pgr [phone])  Heart Rhythm Management (pgr [phone])        Medicare attestation       Subjective:  Awake and alert this am ambulating halls.  Pain improving.      Medications  Scheduled Meds:    aspirin chewable tablet 81 mg 81 mg Oral QDAY   furosemide (LASIX) injection 40 mg 40 mg Intravenous Q8H*   heparin (porcine) injection 7,500 Units 0.75 mL 7,500 Units Subcutaneous Q8H   insulin aspart U-100 (NOVOLOG FLEXPEN) injection PEN 0-14 Units 0-14 Units Subcutaneous 5 X Day   insulin aspart U-100 (NOVOLOG FLEXPEN) injection PEN 4 Units 4 Units Subcutaneous TID w/ meals   insulin NPH (HUMULIN N KwikPen) injection PEN 14 Units 14 Units Subcutaneous QDAY w/breakfast   levothyroxine (SYNTHROID) tablet 175 mcg 175 mcg Oral QDAY(07)   lidocaine (LIDODERM) 5 % topical patch 1-2 patch 1-2 patch Topical QDAY   metoprolol tartrate (LOPRESSOR) tablet 75 mg 75 mg Oral BID  pantoprazole (PROTONIX) injection 40 mg 40 mg Intravenous BID(11-21)   polyethylene glycol 3350 (MIRALAX) packet 17 g 1 packet Oral QDAY   rosuvastatin (CRESTOR) tablet 40 mg 40 mg Oral QDAY   senna/docusate (SENOKOT-S) tablet 2 tablet 2 tablet Oral BID   Continuous Infusions:  PRN and Respiratory Meds:acetaminophen Q6H PRN **OR** [DISCONTINUED] acetaminophen Q6H PRN, albuterol 0.5% Q4H PRN, alum/mag hydroxide/simeth Q4H PRN, bisacodyl QDAY PRN, hydrALAZINE Q6H PRN, milk of magnesia (CONC) QDAY PRN, ondansetron Q6H PRN **OR** ondansetron (ZOFRAN) IV Q6H PRN, oxyCODONE Q4H PRN, potassium chloride SR PRN **OR** potassium chloride PRN **OR** potassium chloride in water PRN, traMADol Q6H PRN    Objective                       Vital Signs: Last Filed                 Vital Signs: 24 Hour Range   BP: 124/58 (09/13 0753) Temp: 36.8 ???C (98.2 ???F) (09/13 0753)  Pulse: 100 (09/13 0753)  Respirations: 18 PER MINUTE (09/13 0753)  SpO2: 96 % (09/13 0753)  O2 Delivery: Nasal Cannula (09/13 0356) BP: (109-140)/(47-65)   Temp:  [36.5 ???C (97.7 ???F)-36.8 ???C (98.2 ???F)]   Pulse:  [74-103]   Respirations:  [18 PER MINUTE]   SpO2:  [88 %-98 %]   O2 Delivery: Nasal Cannula   Intensity Pain Scale (Self Report): 6 (02/12/17 2250) Vitals:    02/11/17 0547 02/12/17 0252 02/13/17 0452   Weight: 113.3 kg (249 lb 12.8 oz) 110 kg (242 lb 9.6 oz) 109.9 kg (242 lb 3.2 oz)         Intake/Output Summary:  (Last 24 hours)    Intake/Output Summary (Last 24 hours) at 02/13/17 0925  Last data filed at 02/13/17 0452   Gross per 24 hour   Intake              840 ml   Output              805 ml   Net               35 ml           Body mass index is 41.55 kg/m???.    Physical Exam        GEN: no acute distress  HEENT: no JVD  CHEST: + chest tube in place, shallow breaths related to pain; clear to auscultated anteriorly   HEART: reg rhythm, nml rate; nml S1 & S2, no S3 or S4; no rub; no murmurs  ABD: soft  EXT: trace bipedal edema  NEURO: A&Ox3  SKIN: warm and dry; midsternal incision with edges well approximated - no erythema or drainage    Lab Review  Hematology:    Lab Results   Component Value Date    HGB 8.6 02/13/2017    HCT 25.7 02/13/2017    PLTCT 212 02/13/2017    WBC 7.1 02/13/2017    NEUT 65 02/10/2017    ANC 3.50 02/10/2017    ALC 0.80 02/10/2017    MONA 15 02/10/2017    AMC 0.80 02/10/2017    ABC 0.00 02/10/2017    MCV 93.2 02/13/2017    MCHC 33.4 02/13/2017    MPV 7.9 02/13/2017    RDW 16.7 02/13/2017   , Coagulation:    Lab Results   Component Value Date    PTT 26.8 02/04/2017    INR 1.3 02/04/2017   , General Chemistry:  Lab Results   Component Value Date    NA 137 02/13/2017    K 3.9 02/13/2017    CL 97 02/13/2017    GAP 6 02/13/2017    BUN 22 02/13/2017    CR 1.38 02/13/2017    GLU 145 02/13/2017    CA 9.3 02/13/2017    ALBUMIN 3.9 02/07/2017 LACTIC 0.8 02/05/2017    OBSCA 1.17 02/05/2017    MG 2.6 02/10/2017    TOTBILI 0.5 02/07/2017    and Endocrine:   Lab Results   Component Value Date    TSH 2.220 02/12/2017     Telemetry  AFlutter with V-rates in the 90's to 100's      Michaelene Song, APRN-NP (pgr [phone])

## 2017-02-13 NOTE — Progress Notes
Assumed care at 0700.  A flutter on tele, VSS per trend.   A&Ox4 with decreased attention span and coaching needed at times, tolerating 1L NC, Adequate UOP, -BM. Incisions C/D/I.   Intermittent nausea controlled with PRN Zofran.   Will continue to monitor.

## 2017-02-14 DIAGNOSIS — I214 Non-ST elevation (NSTEMI) myocardial infarction: Secondary | ICD-10-CM

## 2017-02-14 LAB — POC GLUCOSE
Lab: 114 mg/dL — ABNORMAL HIGH (ref 70–100)
Lab: 135 mg/dL — ABNORMAL HIGH (ref 70–100)
Lab: 173 mg/dL — ABNORMAL HIGH (ref 70–100)
Lab: 143 mg/dL — ABNORMAL HIGH (ref 70–100)

## 2017-02-14 LAB — CULTURE-URINE W/SENSITIVITY: Lab: >10

## 2017-02-14 LAB — BASIC METABOLIC PANEL: Lab: 134 MMOL/L — ABNORMAL LOW (ref 137–147)

## 2017-02-14 LAB — CBC: Lab: 7.3 K/UL (ref 4.5–11.0)

## 2017-02-14 MED ORDER — ALTEPLASE 2 MG IK SOLR
2 mg | Freq: Once | INTRAMUSCULAR | 0 refills | Status: CP
Start: 2017-02-14 — End: ?
  Administered 2017-02-14: 16:00:00 2 mg via INTRAMUSCULAR

## 2017-02-14 MED ORDER — PSYLLIUM HUSK (WITH SUGAR) 3.4 GRAM PO PWPK
1 | Freq: Every day | ORAL | 0 refills | Status: DC
Start: 2017-02-14 — End: 2017-02-16
  Administered 2017-02-14 – 2017-02-15 (×2): 3.4 g via ORAL

## 2017-02-14 MED ORDER — PSYLLIUM HUSK (WITH SUGAR) 3.4 GRAM PO PWPK
1 | Freq: Every day | ORAL | 0 refills | Status: CN
Start: 2017-02-14 — End: ?

## 2017-02-14 MED ORDER — FUROSEMIDE 10 MG/ML IJ SOLN
40 mg | Freq: Every day | INTRAVENOUS | 0 refills | Status: DC
Start: 2017-02-14 — End: 2017-02-16
  Administered 2017-02-14 – 2017-02-15 (×2): 40 mg via INTRAVENOUS

## 2017-02-14 MED ORDER — POLYETHYLENE GLYCOL 3350 17 GRAM PO PWPK
17 g | Freq: Every day | ORAL | 0 refills | Status: CN
Start: 2017-02-14 — End: ?

## 2017-02-14 NOTE — Consults
Diabetes Education follow up:    Revisited with pt to review current plans for DC on NPH BID.  Pt verbalized understanding.  Pt again denies need for teaching how to use vial and syringe.    Reviewed need to mix NPH insulin prior to administration and that vials are good for 28 days.      Pt does not have any further questions at this time.      Maricela Bo, BSN, RN  Clinical Nurse Coordinator - Diabetes Education  Nursing Clinical Excellence  [facility]  [email or web address]  [phone] office  [phone] pager

## 2017-02-14 NOTE — Consults
Endocrinology Consult History and Physical   Name:  Diane Savage                                             MRN:  [id number]   Admission Date:  01/31/2017    Active Problems:    Type 2 diabetes mellitus with circulatory disorder, without long-term current use of insulin (HCC)    PAF (paroxysmal atrial fibrillation) (HCC)    Atrial fibrillation with rapid ventricular response (HCC)    Chronic gastrointestinal bleeding    Transfusion-dependent anemia    CAD (coronary artery disease)    HLD (hyperlipidemia)    Hypothyroidism    Hepatitis B    Obstructive pattern present on pulmonary function testing    Acute blood loss anemia    Thrombocytopenia (HCC)    Junctional rhythm    AKI (acute kidney injury) (HCC)    Hypertension      Reason for Consult:     DM, HgbA1c 7 on orals at home, pt now s/p CABG/MAZE    Assessment / Plan     Type 2 Diabetes Mellitus, uncontrolled  Last HgbA1C 7.1 on 01/31/2017  Hemoglobin A1C   Date Value Ref Range Status   01/31/2017 7.1 (H) 4.0 - 6.0 % Final     Comment:     The ADA recommends that most patients with type 1 and type 2 diabetes maintain   an A1c level <7%.       No results found for: A1C  PTA regimen: Glimepiride 2 mg twice daily, metformin 1000 mg twice daily  Hypoglycemic episodes on this regimen: none  Follows up with PCP for diabetes management  Diabetic-complications assessment:   Retinopathy: no.    Peripheral neuropathy: none   Autonomic neuropathy: no   Nephropathy: unknown   Macrovascular complications: known CAD, S/p CABG  Risk Factor assessment  Last lipid:     Lab Results   Component Value Date    CHOL 88 02/08/2017    TRIG 85 02/08/2017    HDL 36 (L) 02/08/2017    LDL 43 02/08/2017    VLDL 17 02/08/2017    NONHDLCHOL 52 02/08/2017       BP Readings from Last 1 Encounters:   02/14/17 121/65     On ACEi/ARB?:  Enalapril  On Statin?: Rosuvastatin    CAD / NSTEMI:  s/p CABG 02/04/2017  Atrial fibrillation  Hyperlipidemia  Hypothyroidism  Anemia  AKI Impression / Recommendations    Patient has limited insurance coverage  Continue nph 14 units in the morning  Continue Aspart 4 TID AC  MDCF  Patient DM is improving  Patient creatinine such that restarting home diabetic regimen is not an option. We will start on basal bolus for now. She would likely benefit from GLP-1 agonist at discharge and if Cr improves restarting home regimen would certainly help.    Patient stable for discharge on this regimen      Glucose, POC   Date/Time Value Ref Range Status   02/14/2017 0843 135 (H) 70 - 100 MG/DL Final   45/40/9811 9147 114 (H) 70 - 100 MG/DL Final   82/95/6213 0865 222 (H) 70 - 100 MG/DL Final   78/46/9629 5284 165 (H) 70 - 100 MG/DL Final   13/24/4010 2725 146 (H) 70 - 100 MG/DL Final   36/64/4034 7425 150 (H) 70 -  100 MG/DL Final   91/47/8295 6213 128 (H) 70 - 100 MG/DL Final   08/65/7846 9629 226 (H) 70 - 100 MG/DL Final         Darcel Bayley, MD  Pager [phone]     History of Present Illness:      Diane Savage is a 61 y.o. female with hypothyroidism, arthritis, CAD, hypertension, diabetes type 2, hyperlipidemia, hypertension who presented to [facility] on 01/31/2017 as a transfer from [facility] for multivessel disease and need for CABG.  Patient previously on oral medications and now getting some insulin and endocrinology consult for diabetes management    Patient initially presented to [facility] prior to admission on 01/31/2017.  Patient presented to hospital in [facility] complaining of chest pain and had a hemoglobin of 6 was given a blood transfusion.  Troponins were elevated at that time and she underwent a cardiac catheterization that showed multivessel disease and was referred to [facility] for CABG.  Patient underwent CABG on 02/04/2017.  Patient was managed with oral medications prior to admission and at this point, postop day 5 patient is currently on a mid dose correction factor with glucose ranging anywhere from the 100s to the 200s. Patient creatinine such that restarting home diabetic regimen is not an option. We will start on basal bolus for now. She would likely benefit from GLP-1 agonist at discharge and if Cr improves restarting home regimen would certainly help.      Past Medical History:   Diagnosis Date   ??? Acquired hypothyroidism    ??? Arthritis    ??? Back pain    ??? Bleeding disorder (HCC)    ??? Coronary artery disease    ??? Diabetes mellitus (HCC)     Type II   ??? Hypertension    ??? Stomach disorder    ??? Vision decreased      Past Surgical History:   Procedure Laterality Date   ??? HX HEART CATHETERIZATION  2017   ??? CORONARY STENT PLACEMENT  2017   ??? CORONARY ARTERY BYPASS GRAFT N/A 02/04/2017    CORONARY ARTERY BYPASS WITH ARTERIAL GRAFT - 4 GRAFTS (Internal Mammary Artery and Endovascular Vein Harvest) performed by Collene Schlichter, MD at [facility]   ??? HX MAZE N/A 02/04/2017    MAZE PROCEDURE performed by Collene Schlichter, MD at [facility]   ??? ANKLE SURGERY      Left ankle reconstruction   ??? COLONOSCOPY     ??? HX KNEE ARTHROSCOPY Left    ??? HX TONSIL AND ADENOIDECTOMY     ??? TUBAL LIGATION     ??? UPPER GASTROINTESTINAL ENDOSCOPY       Family History   Problem Relation Age of Onset   ??? Diabetes Mother    ??? Heart Disease Mother    ??? High Cholesterol Mother    ??? Arthritis-rheumatoid Mother    ??? Stroke Mother    ??? Thyroid Disease Mother    ??? Coronary Artery Disease Mother    ??? Hyperlipidemia Mother    ??? Cancer Father    ??? Cancer-Breast Sister    ??? Cancer Sister    ??? Hypertension Sister    ??? Heart Disease Sister    ??? High Cholesterol Sister    ??? Arthritis-osteo Sister    ??? Migraines Sister    ??? Rashes/Skin Problems Sister    ??? Depression Sister    ??? Hyperlipidemia Sister    ??? Cancer Brother    ??? Hypertension Brother    ???  Arthritis-rheumatoid Brother    ??? Rashes/Skin Problems Brother    ??? Coronary Artery Disease Brother    ??? Heart Disease Maternal Aunt    ??? Coronary Artery Disease Paternal Uncle    ??? Hyperlipidemia Paternal Uncle ??? Cancer Maternal Grandmother    ??? Diabetes Maternal Grandmother    ??? Heart Disease Maternal Grandmother    ??? Cancer-Colon Paternal Grandmother    ??? Cancer Paternal Grandmother    ??? Diabetes Paternal Grandmother    ??? Cancer Paternal Grandfather      Social History     Social History   ??? Marital status: Married     Spouse name: N/A   ??? Number of children: N/A   ??? Years of education: N/A     Social History Main Topics   ??? Smoking status: Former Smoker     Packs/day: 2.00     Years: 10.00     Types: Cigarettes     Quit date: 09/16/1980   ??? Smokeless tobacco: Never Used   ??? Alcohol use Yes      Comment: Seldom   ??? Drug use: No   ??? Sexual activity: Not on file     Other Topics Concern   ??? Not on file     Social History Narrative   ??? No narrative on file      Immunizations (includes history and patient reported): There is no immunization history for the selected administration types on file for this patient.        Allergies:  Diltiazem; Sulfa (sulfonamide antibiotics); Duricef [cefadroxil]; Pravastatin; and Simvastatin    Review of Systems:   Constitutional: negative for fevers, chills  Eyes: negative for visual disturbance  Ears, nose, mouth, throat, and face: change in hearing  Respiratory: negative for cough or SOB  Cardiovascular: negative for chest pain, palpitations  Gastrointestinal: negative for nausea, vomiting, abdominal pain  Genitourinary:negative for hematuria  Integument/breast: negative for rash  Musculoskeletal:negative for myalgias and arthralgias  Neurological: negative for headaches, dizziness and lightheadedness  Endocrine: negative for heat or cold intolerance    Physical Exam:      Vital Signs: Last Filed In 24 Hours Vital Signs: 24 Hour Range   BP: 121/65 (09/14 0758)  Temp: 36.7 ???C (98 ???F) (09/14 0758)  Pulse: 95 (09/14 0758)  Respirations: 18 PER MINUTE (09/14 0758)  SpO2: 97 % (09/14 0758)  O2 Delivery: None (Room Air) (09/14 0758) BP: (93-125)/(40-65) Temp:  [36.3 ???C (97.3 ???F)-36.7 ???C (98 ???F)]   Pulse:  [78-99]   Respirations:  [18 PER MINUTE]   SpO2:  [93 %-97 %]   O2 Delivery: None (Room Air)   Intensity Pain Scale (Self Report): 6 (02/13/17 1205)        General:  Alert, cooperative, no distress, appears stated age  Head:  Normocephalic  Eyes:  No scleral icterus  Neck:  Supple, symmetrical  Lungs:  CTA  Heart:    RRR  Abdomen:  S, NT, + BS  Skin:   No rashes  Neurologic:  Alert      Medications:  No current facility-administered medications on file prior to encounter.      Current Outpatient Prescriptions on File Prior to Encounter   Medication Sig Dispense Refill   ??? enalapril (VASOTEC) 5 mg tablet Take 5 mg by mouth daily.     ??? folic acid (FOLVITE) 1 mg tablet Take 1 mg by mouth daily.     ??? furosemide (LASIX) 20 mg tablet Take 20  mg by mouth every morning.     ??? glimepiride (AMARYL) 2 mg tablet Take 2 mg by mouth twice daily.     ??? GLUCOSAMINE HCL/CHONDROITIN SU (GLUCOSAMINE-CHONDROITIN PO) Take 1 tablet by mouth twice daily.     ??? levothyroxine (SYNTHROID) 175 mcg tablet Take 175 mcg by mouth daily 30 minutes before breakfast.     ??? metFORMIN (GLUCOPHAGE) 500 mg tablet Take 1,000 mg by mouth twice daily with meals.     ??? metoprolol XL (TOPROL XL) 100 mg extended release tablet Take 100 mg by mouth twice daily.     ??? nitroglycerin (NITROSTAT) 0.4 mg tablet Place 0.4 mg under tongue every 5 minutes as needed for Chest Pain. Max of 3 tablets, call 911.     ??? omeprazole DR(+) (PRILOSEC) 40 mg capsule Take 40 mg by mouth twice daily.     ??? ondansetron (ZOFRAN) 8 mg tablet Take 8 mg by mouth every 8 hours as needed for Nausea or Vomiting.     ??? promethazine (PHENERGAN) 25 mg tablet Take 25 mg by mouth every 6 hours as needed for Nausea or Vomiting.     ??? sucralfate (CARAFATE) 1 gram tablet Take 1 g by mouth four times daily. Take on an empty stomach.     ??? traMADol (ULTRAM) 50 mg tablet Take 50 mg by mouth every 6 hours as needed for Pain. Lab/Radiology/Other Diagnostic Tests:  24-hour labs:    Results for orders placed or performed during the hospital encounter of 01/31/17 (from the past 24 hour(s))   POC GLUCOSE    Collection Time: 02/13/17 12:02 PM   Result Value Ref Range    Glucose, POC 165 (H) 70 - 100 MG/DL   POC GLUCOSE    Collection Time: 02/13/17  4:59 PM   Result Value Ref Range    Glucose, POC 222 (H) 70 - 100 MG/DL   POC GLUCOSE    Collection Time: 02/13/17  9:10 PM   Result Value Ref Range    Glucose, POC 114 (H) 70 - 100 MG/DL   CBC    Collection Time: 02/14/17  4:42 AM   Result Value Ref Range    White Blood Cells 7.3 4.5 - 11.0 K/UL    RBC 2.68 (L) 4.0 - 5.0 M/UL    Hemoglobin 8.2 (L) 12.0 - 15.0 GM/DL    Hematocrit 16.1 (L) 36 - 45 %    MCV 92.4 80 - 100 FL    MCH 30.7 26 - 34 PG    MCHC 33.2 32.0 - 36.0 G/DL    RDW 09.6 (H) 11 - 15 %    Platelet Count 196 150 - 400 K/UL    MPV 7.8 7 - 11 FL   BASIC METABOLIC PANEL    Collection Time: 02/14/17  4:42 AM   Result Value Ref Range    Sodium 134 (L) 137 - 147 MMOL/L    Potassium 4.1 3.5 - 5.1 MMOL/L    Chloride 95 (L) 98 - 110 MMOL/L    CO2 34 (H) 21 - 30 MMOL/L    Anion Gap 5 3 - 12    Glucose 152 (H) 70 - 100 MG/DL    Blood Urea Nitrogen 25 7 - 25 MG/DL    Creatinine 0.45 (H) 0.4 - 1.00 MG/DL    Calcium 9.1 8.5 - 40.9 MG/DL    eGFR Non African American 31 (L) >60 mL/min    eGFR African American 38 (L) >60 mL/min  POC GLUCOSE    Collection Time: 02/14/17  8:43 AM   Result Value Ref Range    Glucose, POC 135 (H) 70 - 100 MG/DL     Glucose: (!) 440 (03/29/24 0442)  POC Glucose (Download): (!) 135 (02/14/17 3664)  Pertinent radiology reviewed.

## 2017-02-14 NOTE — Progress Notes
Assessments complete and documented per flowsheet, no acute changes noted.   A/Ox4. VSS. Tolerating RA. A. Flutter on tele.   No pain reported this shift. Zofran for nausea.   Surgical incisions clean, dry, approximated, and open to air.   Ambulating x2 full laps w/rests so far this shift, fall bundle in place.   UOA, BM 9/11.  Call light within reach, will continue to monitor until transferring care to night shift RN.

## 2017-02-14 NOTE — Progress Notes
PHYSICAL THERAPY  PROGRESS NOTE       MOBILITY:  Progressive Mobility Level: Walk laps  Distance Walked (feet): 360 ft (4 seated rest breaks)  Level of Assistance: Stand by assistance  Assistive Device: Walker    SUBJECTIVE:  Significant hospital events: s/p CABG x4 on 9/4, Chest tube inserted  (9/11) - keep to continous suction, CT  pulled (9/13)    Mental / Cognitive Status: Alert;Oriented;Cooperative  Persons Present: RehabTechnician  Pain: Patient has no complaint of pain  Ambulation Assist: Independent Mobility in Community without Device  Patient Owned Equipment: 4-Wheeled Audiological scientist  Home Situation: Lives with Family  Type of Home: House  Entry Stairs: Ramp    The patient notes independence with ADLs and IADLs. Is a retired [employer] fundraiser.     BED MOBILITY/TRANSFERS:  Transfer Type: Sit to Stand  Transfer: Assistance Level: To/From;Bed;Standby Assist  Transfer: Assistive Device: Nurse, adult    Other Transfer Type: Sit to Stand  Other Transfer: Assistance Level: To/From (bench in hallway (x5 repetitions) with standby assist )  Other Transfer: Assistive Device: Roller Walker    GAIT:  Gait Distance: 360 feet (5 seated rest breaks)  Gait: Assistance Level: Standby Assist  Gait: Assistive Device: Nurse, adult  Gait: Descriptors: Decreased foot clearance RLE;Decreased foot clearance LLE;No balance loss    ASSESSMENT/PROGRESS:  Assessment/Progress: The patient with greatly improved motivation this date. No confusion demonstrated during session. She does require frequent rest breaks due to patient choice - anticipate the patient could likely walk greater distance between, but not willing to attempt at this time.    AM-PAC 6 Clicks Basic Mobility Inpatient  Turning from your back to your side while in a flat bed without using bed rails: None  Moving from lying on your back to sitting on the side of a flatbed without using bedrails : None  Moving to and from a bed to a chair (including a wheelchair): None Standing up from a chair using your arms (e.g. wheelchair, or bedside chair): None  To walk in hospital room: None  Climbing 3-5 steps with a railing: A Little  Raw Score: 23  Standardized (T-scale) Score: 50.88  Basic Mobility CMS 0-100%: 16.55  CMS G Code Modifier for Basic Mobility: CI    GOALS:  Goal Formulation: With Patient  Time For Goal Achievement: 1 day, To, 5 days  Pt Will Go Supine To/From Sit: w/ Stand By Assist  Pt Will Transfer Sit to Stand: w/ Stand By Assist  Pt Will Ambulate: Greater than 200 Feet, w/ Walker, w/ Stand By Assist    PLAN:  Plan   Plan Frequency: 3- 5 Days per Week  Plan for progression: if patient remains in acute setting next week, will plan on following up to ensure consistent performance     RECOMMENDATIONS:  PT Discharge Recommendations: Home with Assistance  Equipment Recommendations: Patient owns necessary equipment    Therapist: Elvina Sidle, PT  Date: 02/14/2017

## 2017-02-14 NOTE — Progress Notes
Cardiothoracic Surgery Progress Note     [name]  [id number] Date:  02/14/2017  Admission Date: 01/31/2017  LOS: 14 days    POD: 11    Overnight:  Nausea. Home phenergan restarted    Procedure: CORONARY ARTERY BYPASS WITH ARTERIAL GRAFT - 4 GRAFTS (Internal Mammary Artery and Endovascular Vein Harvest): [id number] (CPT???)  MAZE PROCEDURE:       Active Problems:    Type 2 diabetes mellitus with circulatory disorder, without long-term current use of insulin (HCC)    PAF (paroxysmal atrial fibrillation) (HCC)    Atrial fibrillation with rapid ventricular response (HCC)    Chronic gastrointestinal bleeding    Transfusion-dependent anemia    CAD (coronary artery disease)    HLD (hyperlipidemia)    Hypothyroidism    Hepatitis B    Obstructive pattern present on pulmonary function testing    Acute blood loss anemia    Thrombocytopenia (HCC)    Junctional rhythm    AKI (acute kidney injury) (HCC)    Hypertension      Assessment/Plan:      Neuro ??? Confusion at times, not taking any pain medication, Tylenol prn pain.  Pt family reports confusion at home also. Neuropsych saw-no note.   CV ??? Afib/Aflutter 80's-90's, initially holding amio/BB for post-op JR, has h/o pAF. EP following. Was not on AC d/t GI bleeding. SBP 90s-120's. Metoprolol increased to 75mg  BID yesterday. Cont ASA 81mg  & rosuvastatin.  Intra op TEE LVEF 60% with LVH.  Resp ??? On RA-RA O2, FEV1: 52%. Continue IS & aggressive pulm hygiene.  CXR: slight improvement of left pleural effusion, bibasilar atelectasis, mild congestion.   Renal ??? CKD (baseline Creat 1.0-1.7), creat 1.9-->1.84-->1.45-->1.25-->1.38-->1.68-->1.89. UOP -350cc/24 hrs on Lasix 40mg  IV daily. Missing HAT.  Net -3.3L since admit, below POW. Cont pyridium.  GI - +BM 9/11, cont bowel regimen. H/o chronic GI bleeding. 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 F/u with GI as outpt for EGD and possible ablation, GI signed off. Continue Protonix BID per GI rec's. ID ??? Afebrile, WBC 7.3-->6.7. Ecoli UTI pre op- completed rocephin abx. Repeat UA: no evidence of infection  Heme ??? Hb stable 8.1, Plt 183.  H/O chronic GI bleeding, has received multiple transfusions this admission, +Ab, SQ heparin for DVT ppx.  Hematology signed off.  FEN ???  H/o NIDDM, HbA1c 7.1 on Metformin and glimepiride PTA- PCP follows, BS 114-222 on NPH and SS novolog.  Endo following, PTA Synthroid.   Activity ??? OOBTC, unsteady gait with decreased attention span. Ambulate in halls as able, goal of at least TID. PT/OT- 360 yest with CR.  Disposition: Work on [employer] today. Discharge tomorrow. Ambulate      Prophylaxis Review:  Lines:  No   Antibiotic Usage:  no  VTE:  Pharmacological prophylaxis; SQ Heparin and Mechanical prophylaxis; Foot pump  Urinary Catheter: No     , PA-C   02/14/2017       Subjective:       HPI:   Diane Savage is a 61 y.o. female with PMH significant for pA-fib, HTN, HLD, NIDDM, Chronic blood anemia, transfusion dependent anemia, CAD, hypothyroidism who presented to OSH with complains of chest pain. Patient states that she has had chest pain for months. Troponin was elevated at 0.3.  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 She also developed A fib with RVR at OSH. Her Hgb at the OSH was 6.3 on arrival. Patient was transferred to [facility] for further care.  Anemia  has been presents for years and has had an extensive GI work up including EGD, capsule and CE which were all negative. She was taken off of anticoagulation given her chronic anemia.  Cath revealed 80% LMCA stenosis and high-grade RCA lesion as well.  She presents today to the ICU S/P CABG x 4 with MAZE procedure. She remains in the ICU for acute hypercarbic respiratory failure and acute renal failure.  She was transferred to CTP yesterday, has had increasing confusion overnight    Objective:        Medications:  Scheduled Meds:    aspirin chewable tablet 81 mg 81 mg Oral QDAY furosemide (LASIX) injection 40 mg 40 mg Intravenous QDAY   heparin (porcine) injection 7,500 Units 0.75 mL 7,500 Units Subcutaneous Q8H   insulin aspart U-100 (NOVOLOG FLEXPEN) injection PEN 0-14 Units 0-14 Units Subcutaneous 5 X Day   insulin aspart U-100 (NOVOLOG FLEXPEN) injection PEN 4 Units 4 Units Subcutaneous TID w/ meals   insulin NPH (HUMULIN N KwikPen) injection PEN 14 Units 14 Units Subcutaneous QDAY w/breakfast   levothyroxine (SYNTHROID) tablet 175 mcg 175 mcg Oral QDAY(07)   lidocaine (LIDODERM) 5 % topical patch 1-2 patch 1-2 patch Topical QDAY   metoprolol tartrate (LOPRESSOR) tablet 75 mg 75 mg Oral BID   pantoprazole DR (PROTONIX) tablet 40 mg 40 mg Oral BID(11-21)   polyethylene glycol 3350 (MIRALAX) packet 17 g 1 packet Oral QDAY   psyllium (METAMUCIL) packet 3.4 g 1 packet Oral QDAY   rosuvastatin (CRESTOR) tablet 40 mg 40 mg Oral QDAY   senna/docusate (SENOKOT-S) tablet 2 tablet 2 tablet Oral BID   Continuous Infusions:    PRN and Respiratory Meds:acetaminophen Q6H PRN **OR** [DISCONTINUED] acetaminophen Q6H PRN, albuterol 0.5% Q4H PRN, alum/mag hydroxide/simeth Q4H PRN, bisacodyl QDAY PRN, hydrALAZINE Q6H PRN, milk of magnesia (CONC) QDAY PRN, ondansetron Q6H PRN **OR** ondansetron (ZOFRAN) IV Q6H PRN, oxyCODONE Q4H PRN, potassium chloride SR PRN **OR** potassium chloride PRN **OR** potassium chloride in water PRN, traMADol Q6H PRN                       Vital Signs: Last Filed                  Vital Signs: 24 Hour Range   BP: 117/51 (09/14 1152)  Temp: 36.5 ???C (97.7 ???F) (09/14 1152)  Pulse: 70 (09/14 1152)  Respirations: 18 PER MINUTE (09/14 1152)  SpO2: 99 % (09/14 1152)  O2 Delivery: None (Room Air) (09/14 1152) BP: (93-125)/(40-65)   Temp:  [36.4 ???C (97.6 ???F)-36.7 ???C (98 ???F)]   Pulse:  [70-99]   Respirations:  [18 PER MINUTE]   SpO2:  [93 %-99 %]   O2 Delivery: None (Room Air)   Intensity Pain Scale (Self Report): 0 (02/14/17 1230) Vitals:    02/12/17 0252 02/13/17 0452 02/14/17 0522 Weight: 110 kg (242 lb 9.6 oz) 109.9 kg (242 lb 3.2 oz) 109.3 kg (241 lb)           Intake/Output Summary:  (Last 24 hours)    Intake/Output Summary (Last 24 hours) at 02/14/17 1352  Last data filed at 02/14/17 1100   Gross per 24 hour   Intake              240 ml   Output             1250 ml   Net            -1010 ml  Physical Exam:         Neuro: Alert and oriented, intermittently confused   Cardiovascular: Irregularly irregular rhythm  Respiratory: LS CTA bil - diminished in the bases  GI: soft, NT, +BS  Extremities: trace BLE   Incisions: c/d/i    Laboratory:  LABS:  Recent Labs      02/12/17   0450  02/12/17   1739  02/13/17   0356  02/14/17   0442   NA  137   --   137  134*   K  3.6   --   3.9  4.1   CL  97*   --   97*  95*   CO2  35*   --   34*  34*   GAP  5   --   6  5   BUN  22   --   22  25   CR  1.25*   --   1.38*  1.68*   GLU  130*   --   145*  152*   CA  9.2   --   9.3  9.1   TSH   --   2.220   --    --        Recent Labs      02/12/17   0450  02/13/17   0356  02/14/17   0442   WBC  5.9  7.1  7.3   HGB  8.3*  8.6*  8.2*   HCT  25.0*  25.7*  24.8*   PLTCT  179  212  196      Estimated Creatinine Clearance: 43 mL/min (A) (based on SCr of 1.68 mg/dL (H)).  Vitals:    02/12/17 0252 02/13/17 0452 02/14/17 0522   Weight: 110 kg (242 lb 9.6 oz) 109.9 kg (242 lb 3.2 oz) 109.3 kg (241 lb)      No results for input(s): PHART, PO2ART in the last 72 hours.    Invalid input(s): PC02A        Radiology and Other Diagnostic Procedures Review:    Reviewed

## 2017-02-14 NOTE — Progress Notes
Electrophysiology Progress Note    Admission Date: 01/31/2017  Today's Date: 02/14/2017  LOS: 14 days    Assessment & Plan   Diane Savage is a 61 y.o. female patient with the following problems:    Active Problems:    Type 2 diabetes mellitus with circulatory disorder, without long-term current use of insulin (HCC)    PAF (paroxysmal atrial fibrillation) (HCC)    Atrial fibrillation with rapid ventricular response (HCC)    Chronic gastrointestinal bleeding    Transfusion-dependent anemia    CAD (coronary artery disease)    HLD (hyperlipidemia)    Hypothyroidism    Hepatitis B    Obstructive pattern present on pulmonary function testing    Acute blood loss anemia    Thrombocytopenia (HCC)    Junctional rhythm    AKI (acute kidney injury) (HCC)    Hypertension    Assessment:  Hx paroxysmal atrial fibrillation post op MAZE/LAA ligation POD #8  - ECG early in post op course - sinus bradycardia with low amplitude in P waves  - went into AF/AFl 02/10/17  - PTA metoprolol 100 mg bid held on admission  - metoprolol 25 mg bid started 02/12/17, increased to 50mg  02/12/17 evening  - digoxin 250 mcg daily started 02/01/17  - low dose heparin for now given chest tube removal this am  ???  CAD with severe TVD  - Cardiac cath: 80% LM, Ostial Lcx 80%, RCA with 50% followed by 70%  - Echo doppler 9/1 with preserved EF  - CABG: LIMA to LAD, rSVG to OM/PL branch, rSVG to PDA on 02/04/17    Pleural effusion  - s/p chest tube placement 02/11/17    Hypothyroidism  - TSH 0.25 with free T4 1.3 on 01/31/17  - will recheck thyroid studies now    Chronic transfusion dependent anemia / hx GI bleeding  - hgb 8.3 this am    DM-II  HTN  HLD      Recommendations:  Remains in atrial flutter with improved V-rates.  60's to 90's since chest tube removed this am.  Would continue with rate control strategy until able to be fully anticoagulated and cleared of thrombus formation.  Recommend resuming full anticoagulation as soon as deemed safe from surgical standpoint.  Seems reasonable to follow up in 6-8 weeks post-op and if remains in atrial flutter at that time, proceed with DCCV and possible AAD initiation.  Will standby for now.  Please call Dr. Clint Bolder for any concerns or rhythm issues over the weekend.    Jene Every, NP-C (pgr [phone])  Heart Rhythm Management (pgr [phone])      Medicare attestation       Subjective:  Awake and alert this am ambulating halls.  Pain improving.  Glad chest tube is out.    Medications  Scheduled Meds:    aspirin chewable tablet 81 mg 81 mg Oral QDAY   furosemide (LASIX) injection 40 mg 40 mg Intravenous QDAY   heparin (porcine) injection 7,500 Units 0.75 mL 7,500 Units Subcutaneous Q8H   insulin aspart U-100 (NOVOLOG FLEXPEN) injection PEN 0-14 Units 0-14 Units Subcutaneous 5 X Day   insulin aspart U-100 (NOVOLOG FLEXPEN) injection PEN 4 Units 4 Units Subcutaneous TID w/ meals   insulin NPH (HUMULIN N KwikPen) injection PEN 14 Units 14 Units Subcutaneous QDAY w/breakfast   levothyroxine (SYNTHROID) tablet 175 mcg 175 mcg Oral QDAY(07)   lidocaine (LIDODERM) 5 % topical patch 1-2 patch 1-2 patch Topical QDAY   metoprolol tartrate (  LOPRESSOR) tablet 75 mg 75 mg Oral BID   pantoprazole DR (PROTONIX) tablet 40 mg 40 mg Oral BID(11-21)   polyethylene glycol 3350 (MIRALAX) packet 17 g 1 packet Oral QDAY   psyllium (METAMUCIL) packet 3.4 g 1 packet Oral QDAY   rosuvastatin (CRESTOR) tablet 40 mg 40 mg Oral QDAY   senna/docusate (SENOKOT-S) tablet 2 tablet 2 tablet Oral BID   Continuous Infusions:  PRN and Respiratory Meds:acetaminophen Q6H PRN **OR** [DISCONTINUED] acetaminophen Q6H PRN, albuterol 0.5% Q4H PRN, alum/mag hydroxide/simeth Q4H PRN, bisacodyl QDAY PRN, hydrALAZINE Q6H PRN, milk of magnesia (CONC) QDAY PRN, ondansetron Q6H PRN **OR** ondansetron (ZOFRAN) IV Q6H PRN, oxyCODONE Q4H PRN, potassium chloride SR PRN **OR** potassium chloride PRN **OR** potassium chloride in water PRN, traMADol Q6H PRN Objective                       Vital Signs: Last Filed                 Vital Signs: 24 Hour Range   BP: 117/51 (09/14 1152)  Temp: 36.5 ???C (97.7 ???F) (09/14 1152)  Pulse: 70 (09/14 1152)  Respirations: 18 PER MINUTE (09/14 1152)  SpO2: 99 % (09/14 1152)  O2 Delivery: None (Room Air) (09/14 1152) BP: (93-125)/(40-65)   Temp:  [36.4 ???C (97.6 ???F)-36.7 ???C (98 ???F)]   Pulse:  [70-99]   Respirations:  [18 PER MINUTE]   SpO2:  [93 %-99 %]   O2 Delivery: None (Room Air)   Intensity Pain Scale (Self Report): 0 (02/14/17 1230) Vitals:    02/12/17 0252 02/13/17 0452 02/14/17 0522   Weight: 110 kg (242 lb 9.6 oz) 109.9 kg (242 lb 3.2 oz) 109.3 kg (241 lb)         Intake/Output Summary:  (Last 24 hours)    Intake/Output Summary (Last 24 hours) at 02/14/17 1500  Last data filed at 02/14/17 1100   Gross per 24 hour   Intake              240 ml   Output             1250 ml   Net            -1010 ml           Body mass index is 41.35 kg/m???.    Physical Exam        GEN: no acute distress  HEENT: no JVD  CHEST: faint rales posteriorly that improve with coughing  HEART: reg rhythm, nml rate; nml S1 & S2, no S3 or S4; no rub; no murmurs  ABD: soft  EXT: trace bipedal edema  NEURO: A&Ox3  SKIN: warm and dry; midsternal incision with edges well approximated - no erythema or drainage    Lab Review  Hematology:    Lab Results   Component Value Date    HGB 8.2 02/14/2017    HCT 24.8 02/14/2017    PLTCT 196 02/14/2017    WBC 7.3 02/14/2017    NEUT 65 02/10/2017    ANC 3.50 02/10/2017    ALC 0.80 02/10/2017    MONA 15 02/10/2017    AMC 0.80 02/10/2017    ABC 0.00 02/10/2017    MCV 92.4 02/14/2017    MCHC 33.2 02/14/2017    MPV 7.8 02/14/2017    RDW 16.7 02/14/2017   , Coagulation:    Lab Results   Component Value Date    PTT 26.8 02/04/2017  INR 1.3 02/04/2017   , General Chemistry:    Lab Results   Component Value Date    NA 134 02/14/2017    K 4.1 02/14/2017    CL 95 02/14/2017    GAP 5 02/14/2017    BUN 25 02/14/2017 CR 1.68 02/14/2017    GLU 152 02/14/2017    CA 9.1 02/14/2017    ALBUMIN 3.9 02/07/2017    LACTIC 0.8 02/05/2017    OBSCA 1.17 02/05/2017    MG 2.6 02/10/2017    TOTBILI 0.5 02/07/2017    and Endocrine:   Lab Results   Component Value Date    TSH 2.220 02/12/2017     Telemetry  AFlutter with V-rates in the 60's to 90's      Michaelene Song, APRN-NP (pgr [phone])

## 2017-02-14 NOTE — Consults
Physical Medicine & Rehabilitation Consult Note       Date of Service:  02/14/2017  Diane Savage is a 61 y.o. female.     DOB: [birth year]                  MRN#:  [id number]  Primary Insurance:   Secondary Insurance:   Tertiary Insurance:   Financial Class:  Self-pay  Date of Admission:  01/31/2017  Referring Physician:  Collene Schlichter, MD  Reason for Consult: evaluate for Post-Acute Rehab/Placement  Precautions: Fall, .Sternal   Weight bearing Precautions:  Sternal precautions     Active Problems  Active Problems:    Type 2 diabetes mellitus with circulatory disorder, without long-term current use of insulin (HCC)    PAF (paroxysmal atrial fibrillation) (HCC)    Atrial fibrillation with rapid ventricular response (HCC)    Chronic gastrointestinal bleeding    Transfusion-dependent anemia    CAD (coronary artery disease)    HLD (hyperlipidemia)    Hypothyroidism    Hepatitis B    Obstructive pattern present on pulmonary function testing    Acute blood loss anemia    Thrombocytopenia (HCC)    Junctional rhythm    AKI (acute kidney injury) (HCC)    Hypertension    Impaired mobility and ADLs    Gait abnormality     Assessment & Plan       Diane Savage is a 61 y.o. female admitted to [facility] on 01/31/2017 with the following issues:    debility due to recent CABG    Impairments: poor activity tolerance and weakness  Activity Limitations:  ambulation and stairs  Participation Restrictions: unable to return home safely    Post-acute care rehabilitation needs:   -Agree with PT recommendations of home with assistance given much improved endurance in therapy today.     Other recommendations (bowel, bladder, skin, pain, etc):      Bowel: Recommend bowel regimen with Senokot 2 tabs QHS and Colace 100-200mg  daily while on opiate pain medications +/- Miralax daily PRN    Bladder: Voiding independently    Skin/MSK: Given relative immobility and/or risk for contractures and skin breakdown, continue passive range of motion ~twice daily (partially performed by therapists), progressing as able to active range of motion, and functional activities. Recommend HOB > 30 degrees to reduce shearing, turning Q2 hours while supine in bed, pressure relief Q23mins in seated position, PRAFOs for pressure relief and to prevent contractures       Wende Neighbors, DO  Rehab Consult Pager:  [phone]  History of Present Illness     Hospital Course:     56 yoF with a PMH paroxysmal atrial fibrillation not on AC, HTN, HLD, NIDDM, CAD, hypothyroidism, presented to [facility] with c/o chest pain found Hgb ~6, with positive troponin and concern for NSTEMI 2/2 demand ischemia from chronic CAD. 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 F/u with GI as outpt for EGD and possible ablation. Rehab consulted for post acute rehab/placement. Pt endurance improved with PT today and was able to ambulate 360 ft standby assist.     Past Medical History  Past Medical History:   Diagnosis Date   ??? Acquired hypothyroidism    ??? Arthritis    ??? Back pain    ??? Bleeding disorder (HCC)    ??? Coronary artery disease    ??? Diabetes  mellitus (HCC)     Type II   ??? Hypertension    ??? Stomach disorder    ??? Vision decreased        Past Surgical History  Past Surgical History:   Procedure Laterality Date   ??? HX HEART CATHETERIZATION  2017   ??? CORONARY STENT PLACEMENT  2017   ??? CORONARY ARTERY BYPASS GRAFT N/A 02/04/2017    CORONARY ARTERY BYPASS WITH ARTERIAL GRAFT - 4 GRAFTS (Internal Mammary Artery and Endovascular Vein Harvest) performed by Collene Schlichter, MD at [facility]   ??? HX MAZE N/A 02/04/2017    MAZE PROCEDURE performed by Collene Schlichter, MD at [facility]   ??? ANKLE SURGERY      Left ankle reconstruction   ??? COLONOSCOPY     ??? HX KNEE ARTHROSCOPY Left    ??? HX TONSIL AND ADENOIDECTOMY     ??? TUBAL LIGATION     ??? UPPER GASTROINTESTINAL ENDOSCOPY Family\Social History  Social History     Social History   ??? Marital status: Married     Spouse name: N/A   ??? Number of children: N/A   ??? Years of education: N/A     Social History Main Topics   ??? Smoking status: Former Smoker     Packs/day: 2.00     Years: 10.00     Types: Cigarettes     Quit date: 09/16/1980   ??? Smokeless tobacco: Never Used   ??? Alcohol use Yes      Comment: Seldom   ??? Drug use: No   ??? Sexual activity: Not on file     Other Topics Concern   ??? Not on file     Social History Narrative   ??? No narrative on file       Family History   Problem Relation Age of Onset   ??? Diabetes Mother    ??? Heart Disease Mother    ??? High Cholesterol Mother    ??? Arthritis-rheumatoid Mother    ??? Stroke Mother    ??? Thyroid Disease Mother    ??? Coronary Artery Disease Mother    ??? Hyperlipidemia Mother    ??? Cancer Father    ??? Cancer-Breast Sister    ??? Cancer Sister    ??? Hypertension Sister    ??? Heart Disease Sister    ??? High Cholesterol Sister    ??? Arthritis-osteo Sister    ??? Migraines Sister    ??? Rashes/Skin Problems Sister    ??? Depression Sister    ??? Hyperlipidemia Sister    ??? Cancer Brother    ??? Hypertension Brother    ??? Arthritis-rheumatoid Brother    ??? Rashes/Skin Problems Brother    ??? Coronary Artery Disease Brother    ??? Heart Disease Maternal Aunt    ??? Coronary Artery Disease Paternal Uncle    ??? Hyperlipidemia Paternal Uncle    ??? Cancer Maternal Grandmother    ??? Diabetes Maternal Grandmother    ??? Heart Disease Maternal Grandmother    ??? Cancer-Colon Paternal Grandmother    ??? Cancer Paternal Grandmother    ??? Diabetes Paternal Grandmother    ??? Cancer Paternal Grandfather        Medications:    aspirin chewable tablet 81 mg 81 mg Oral QDAY   furosemide (LASIX) injection 40 mg 40 mg Intravenous QDAY   heparin (porcine) injection 7,500 Units 0.75 mL 7,500 Units Subcutaneous Q8H   insulin aspart U-100 (NOVOLOG FLEXPEN) injection PEN 0-14  Units 0-14 Units Subcutaneous 5 X Day insulin aspart U-100 (NOVOLOG FLEXPEN) injection PEN 4 Units 4 Units Subcutaneous TID w/ meals   insulin NPH (HUMULIN N KwikPen) injection PEN 14 Units 14 Units Subcutaneous QDAY w/breakfast   levothyroxine (SYNTHROID) tablet 175 mcg 175 mcg Oral QDAY(07)   lidocaine (LIDODERM) 5 % topical patch 1-2 patch 1-2 patch Topical QDAY   metoprolol tartrate (LOPRESSOR) tablet 75 mg 75 mg Oral BID   pantoprazole DR (PROTONIX) tablet 40 mg 40 mg Oral BID(11-21)   polyethylene glycol 3350 (MIRALAX) packet 17 g 1 packet Oral QDAY   psyllium (METAMUCIL) packet 3.4 g 1 packet Oral QDAY   rosuvastatin (CRESTOR) tablet 40 mg 40 mg Oral QDAY   senna/docusate (SENOKOT-S) tablet 2 tablet 2 tablet Oral BID       PRN Medications:  acetaminophen Q6H PRN **OR** [DISCONTINUED] acetaminophen Q6H PRN, albuterol 0.5% Q4H PRN, alum/mag hydroxide/simeth Q4H PRN, bisacodyl QDAY PRN, hydrALAZINE Q6H PRN, milk of magnesia (CONC) QDAY PRN, ondansetron Q6H PRN **OR** ondansetron (ZOFRAN) IV Q6H PRN, oxyCODONE Q4H PRN, potassium chloride SR PRN **OR** potassium chloride PRN **OR** potassium chloride in water PRN, traMADol Q6H PRN    Allergies:    Allergies   Allergen Reactions   ??? Diltiazem RASH, NAUSEA AND VOMITING and SEE COMMENTS     Sore mouth   ??? Sulfa (Sulfonamide Antibiotics) RASH   ??? Duricef [Cefadroxil] NAUSEA AND VOMITING   ??? Pravastatin NAUSEA AND VOMITING   ??? Simvastatin NAUSEA AND VOMITING         Prior Level of Function     Prior Function  Level Of Independence: Independent with ADLs and functional transfers;Independent with homemaking w/ ambulation  Lives With: Spouse  Receives Help From: None Needed  Other Function Comments: Patient reports she and spouse share responsibility for performing IADLs. Spouse can perform on his own if needed upon patient return home.    Home Environment:  Home Situation: Lives with Family (02/13/2017  2:00 PM)  Patient Owned Equipment: 4-Wheeled Walker;Roller Walker (02/13/2017  2:00 PM) Type of Home: House (02/13/2017  2:00 PM)  Entry Stairs: Ramp (02/13/2017  2:00 PM)   No Data Recorded  Comments: The patient notes independence with ADLs and IADLs. Is a retired [employer] fundraiser.  (02/13/2017  2:00 PM)  No Data Recorded      Current Level of Function       Physical Therapy:  BED MOBILITY/TRANSFERS:  Transfer Type: Sit to Stand  Transfer: Assistance Level: To/From;Bed  Transfer: Assistive Device: Nurse, adult  ???  GAIT:  Gait Distance: 80 feet (2 seated rest breaks)  Gait: Assistance Level: Minimal Assist  Gait: Assistive Device: Roller Walker  Gait: Descriptors: Decreased foot clearance RLE;Decreased foot clearance LLE;No balance loss  ???  Requires increased time for sitting rest break >5 minutes but vitals stable.    Occupational Therapy:  ADL's  Where Assessed: Chair;Edge of Bed;Supine, Bed  Eating Assist: Independent  LE Dressing Assist: Maximum Assist  LE Dressing Deficits: Don/Doff R Sock;Don/Doff L Sock  Functional Transfer Assist: Minimal Assist  Functional Transfer Deficits: Steadying;Verbal Cueing;Supervision/Safety;Increased Time to Complete  Comment: Patient sitting in bedside chair upon therapy arrival. Patient required maximum assistant for LE dress. Patient reported she usually puts her foot on couch to dress. Required minimal assist of 1 and contact guard assist of 2nd person for sit/stand transfer to/from chair/EOB and steps to bed. Patient required extra time to process commands. Xray entered. Patient required minimal assist for sit>supine transfer. Patient in supine  with Xray and nursing staff upon exit.   ???  Activity Tolerance  Endurance: 2/5 Tolerates 10-20 Minutes Exercise w/Multiple Rests  Sitting Balance: 4+/5 Moves/Returns Trunkal Midpoint 1-2 Inches in Multiple Planes       Review of Systems     A 14 point review of systems was negative except for: Noted in HPI     Physical Exam     BP: 117/51 (09/14 1152)  Temp: 36.5 ???C (97.7 ???F) (09/14 1152)  Pulse: 70 (09/14 1152) Respirations: 18 PER MINUTE (09/14 1152)  SpO2: 99 % (09/14 1152)  O2 Delivery: None (Room Air) (09/14 1152)    Body mass index is 41.35 kg/m???.    Gen: Alert & Oriented X 3, No Acute Distress  HEENT: NCAT, PERRL, EOMI, MMM  Neck: Supple, no elevated JVP  Chest: midline incision c/d/i  Lungs: Not in pulmonary distress   Abdomen: Soft, non-tender, non-distended, +BS  GU:  No foley   Skin: No rash/lesoin   Ext: Moves all extremities spontaneously   MS:   Root Right Left   Shoulder Abduction C5 5 5   Elbow Flexion C5 5 5   Elbow Extension C7 5 5   Wrist Extension C6 5 5   Finger Flexion C8 5 5   Finger Abduction T1 5 5   Hip Flexion L2 4 4   Knee Flexion L5/S1 4 5   Knee Extension L3 4 5   Dorsiflexion L4 5 5   Plantarflexion S1 5 5   EHL Extension L5 5 5     Neuro:  Cranial Nerves Cranial Nerves 2-12 are grossly intact   DTR's No hyperreflexia                    Rapid Alternating Movements Normal   Upper Extremity Tone Normal   Lower Extremity Tone Normal   Upper Extremity Sensation Intact to light touch bilaterally   Lower Extremity Sensation Intact to light touch bilaterally   Clonus Negative Bilaterally   Proprioception Intact Bilaterally   Memory/Cognition/Speech Alert and oriented x3      Intake/Output Summary:    Intake/Output Summary (Last 24 hours) at 02/14/17 1301  Last data filed at 02/14/17 1100   Gross per 24 hour   Intake              240 ml   Output             1250 ml   Net            -1010 ml     Stool Occurrence: 1 (02/11/2017 10:26 AM)    Last Bowel Movement Date: 02/11/17 (02/14/2017  8:30 AM)    Bladder Scan (mL): 70 milliliters (02/01/2017  7:30 AM)  No Data Recorded  No Data Recorded  No Data Recorded  No Data Recorded    Basic Metabolic Profile    Lab Results   Component Value Date/Time    NA 134 (L) 02/14/2017 04:42 AM    K 4.1 02/14/2017 04:42 AM    CA 9.1 02/14/2017 04:42 AM    CL 95 (L) 02/14/2017 04:42 AM    CO2 34 (H) 02/14/2017 04:42 AM    Lab Results   Component Value Date/Time BUN 25 02/14/2017 04:42 AM    CR 1.68 (H) 02/14/2017 04:42 AM    GLU 152 (H) 02/14/2017 04:42 AM      CBC w/Diff    Lab Results   Component Value Date/Time  WBC 7.3 02/14/2017 04:42 AM    RBC 2.68 (L) 02/14/2017 04:42 AM    HGB 8.2 (L) 02/14/2017 04:42 AM    HCT 24.8 (L) 02/14/2017 04:42 AM    MCV 92.4 02/14/2017 04:42 AM    MCH 30.7 02/14/2017 04:42 AM    RDW 16.7 (H) 02/14/2017 04:42 AM    PLTCT 196 02/14/2017 04:42 AM    MPV 7.8 02/14/2017 04:42 AM    Lab Results   Component Value Date/Time    NEUT 65 02/10/2017 05:30 AM    ANC 3.50 02/10/2017 05:30 AM    LYMA 15 (L) 02/10/2017 05:30 AM    ALC 0.80 (L) 02/10/2017 05:30 AM    MONA 15 (H) 02/10/2017 05:30 AM    AMC 0.80 02/10/2017 05:30 AM    EOSA 5 02/10/2017 05:30 AM    AEC 0.20 02/10/2017 05:30 AM    BASA 0 02/10/2017 05:30 AM    ABC 0.00 02/10/2017 05:30 AM        Radiology:  Pertinent radiology reviewed

## 2017-02-14 NOTE — Progress Notes
OCCUPATIONAL THERAPY  NOTE     Chart reviewed and checked status with RN. Met with patient this afternoon, despite encouragement and education about the role and benefits of occupational therapy, patient declines to participate at this time. States she just got done working with PT and would like to rest at this time. Patient agreeable to walk with nursing later this afternoon. OT will continue to follow and provide intervention as indicated.    Therapist: Arrie Aran, OTR/L [id number]  Date: 02/14/2017

## 2017-02-14 NOTE — Case Management (ED)
Case Management Progress Note    NAME:Diane Savage                          MRN: [id number]              DOB:[birth year]          AGE: 60 y.o.  ADMISSION DATE: 01/31/2017             DAYS ADMITTED: LOS: 14 days      Today???s Date: 02/14/2017    Plan  Dc planning ongoing at this time.  Team preferring for pt to dc to IPR setting if possible.  PMR consult placed today to determine eligibility.    Per CTS team huddle this am, pt is s/p 10 from CABG x 4.  Pt continues with confusion.  Neuropsych saw yesterday and there is no note.  EP following.  Metoprolol increased to 75mg  BID yesterday.  Currently on 1L O2.  Slight improvement of L PE, bibasilar atelectasis and mild congestion.  Unsteady gait with decreased attention span.  PT/OT recommending PMR setting.    Interventions  ? Support   Support: Pt/Family Updates re:POC or DC Plan, Patient Education     SW contacted pt's husband this am to discuss dc planning.  SW discussed that pt could benefit from IPR setting d/t her continued confusion and debility.  Husband was in agreement.  However, SW also notified husband that because pt self pay, she will already have a bill from her hospital stay and that she would have a continued bill at [facility].  Husband sounded discouraged and told SW that he was also on SSDI and has medical problems.  He told SW he feels as though he and his wife cannot 'catch a break'.  SW provided empathy and asked if his wife has ever applied for Medicaid.  He denied same.  He asked for SW to call his dgt to discuss these issue with her as he was overwhelmed by talking about it.  SW apologized to him and told him she reached out because she wants for him to be informed of pt's dc plan needs.  Husband thanked SW for same.    SW contacted pt's dgt to discuss dc planning needs.  Dgt was agreeable to talking with SW.  SW asked dgt if pt was exhibiting confusion PTA.  She told SW that pt has been confused for the past several months (e.g. Wearing two pairs of glasses at the same time; using the TV remote as a phone).  SW asked dgt if pt has done anything unsafe in the past.  Dgt denied same.  SW discussed that if pt were to dc to rehab setting the pt would be responsible for the bill because she does not have in insurance.  Dgt asked if they could receive financial assistance.  SW notified dgt that pt can utilize the financial department and arrange a payment plan for her hospital bill and possible rehab bill.  SW went on to discuss IPR setting and the referral process this entails.  SW told her she would f/u on Monday to determine if pt was a good candidate.    ? Info or Referral   Information or Referral to Community Resources: No Needs Identified  ? Discharge Planning   Discharge Planning: No Needs Identified     SW contacted [place] Financial Counselor Hassell Done to discuss pt's medicaid status.  He informed SW that pt  was likely over the income limitation for Medicaid.  SW still requesting for Greig Castilla to reach out to pt's husband to attempt to pursue Medicaid.  SW notified Greig Castilla that pt is not appropriate for conversation given her continued confusion and he will need to reach out to pt's husband.  Greig Castilla to do same.  He also confirmed he had attempted to speak with pt a few days ago for [facility] application and pt was not appropriate with her responses and not able to answer his questions.      ? Medication Needs   Medication Needs: No Needs Identified  ? Financial   Financial: No Needs Identified  ? Legal   Legal: No Needs Identified  ? Other   Other/None: No needs identified    Disposition  ? Expected Discharge Date    Expected Discharge Date: 02/16/17  ? Transportation   Does the patient need discharge transport arranged?: No  Transportation Name, Phone and Availability #1: Diane Savage  Does the patient use Medicaid Transportation?: No  ? Next Level of Care (Acute Psych discharges only)      ? Discharge Disposition Durable Medical Equipment     No service has been selected for the patient.      [facility]     No service has been selected for the patient.      [facility]     No service has been selected for the patient.      [facility]     No service has been selected for the patient.          Gweneth Dimitri, LMSW  Surgery - Cardiothoracic/Vascular  Social Work Case Manager  *[phone]

## 2017-02-14 NOTE — Consults
Diabetes Education follow up:    Per discussion with Dr. Red Christians provided pt with the following insulin dosing schedule.  Quizzed pt on how to use the Novolin R chart and pt able to determine correct dose.      Daily Schedule   Taking Insulin and Monitoring Your Blood Sugar     Morning:  Take your blood sugar & write down the results  Take intermediate insulin _14   units of NOVOLIN N  (NPH)  Take mealtime insulin NOVOLIN R - short acting Insulin per scale below  Eat breakfast after 30 minutes    Lunch:  Take your blood sugar & write down the results  Take mealtime insulin NOVOLIN R - short acting Insulin per scale below  Eat lunch after 30 minutes     Supper/Dinner:  Take your blood sugar & write down the results  Take mealtime insulin NOVOLIN R - short acting Insulin per scale below  Eat supper after 30 minutes    If  your pre-meal blood sugar is: Give this dose of NOVOLIN R   Greater than 70 and less than 140 0+4 = 4   141 to 180 2+4 = 6   181 to 220 4+4 = 8   221 to 260  6+4 = 10   261 to 300  8+4 = 12   301 to 350 10+4 = 14   351 to 400 12+4 = 16   ? 400 14+4 = 18   Bedtime:  Take your blood sugar & write down the results    Call MD if blood sugar is less than 70 (after treating) or greater than 250 for 2 days.    Maricela Bo, BSN, RN  Clinical Nurse Coordinator - Diabetes Education  Nursing Clinical Excellence  [facility]  [email or web address]  [phone] office  [phone] pager

## 2017-02-15 ENCOUNTER — Inpatient Hospital Stay: Admit: 2017-02-04 | Discharge: 2017-02-04 | Payer: MEDICARE

## 2017-02-15 ENCOUNTER — Inpatient Hospital Stay: Admit: 2017-02-09 | Discharge: 2017-02-09 | Payer: MEDICARE

## 2017-02-15 ENCOUNTER — Encounter: Admit: 2017-02-15 | Discharge: 2017-02-15 | Payer: MEDICARE

## 2017-02-15 ENCOUNTER — Inpatient Hospital Stay: Admit: 2017-02-15 | Discharge: 2017-02-15 | Payer: MEDICARE

## 2017-02-15 ENCOUNTER — Inpatient Hospital Stay: Admit: 2017-02-11 | Discharge: 2017-02-11 | Payer: MEDICARE

## 2017-02-15 ENCOUNTER — Inpatient Hospital Stay: Admit: 2017-02-07 | Discharge: 2017-02-07 | Payer: MEDICARE

## 2017-02-15 ENCOUNTER — Inpatient Hospital Stay: Admit: 2017-01-31 | Discharge: 2017-01-31 | Payer: MEDICARE

## 2017-02-15 ENCOUNTER — Inpatient Hospital Stay: Admit: 2017-02-06 | Discharge: 2017-02-06 | Payer: MEDICARE

## 2017-02-15 ENCOUNTER — Inpatient Hospital Stay: Admit: 2017-02-08 | Discharge: 2017-02-08 | Payer: MEDICARE

## 2017-02-15 ENCOUNTER — Inpatient Hospital Stay: Admit: 2017-02-02 | Discharge: 2017-02-02 | Payer: MEDICARE

## 2017-02-15 ENCOUNTER — Inpatient Hospital Stay: Admit: 2017-02-13 | Discharge: 2017-02-13 | Payer: MEDICARE

## 2017-02-15 ENCOUNTER — Inpatient Hospital Stay
Admit: 2017-02-01 | Discharge: 2017-02-15 | Disposition: A | Discharge status: Home / Self Care | Source: Other Acute Inpatient Hospital | Admitting: Cardiovascular Disease

## 2017-02-15 ENCOUNTER — Inpatient Hospital Stay: Admit: 2017-02-01 | Discharge: 2017-02-01 | Payer: MEDICARE

## 2017-02-15 ENCOUNTER — Inpatient Hospital Stay: Admit: 2017-02-14 | Discharge: 2017-02-14 | Payer: MEDICARE

## 2017-02-15 ENCOUNTER — Inpatient Hospital Stay: Admit: 2017-02-12 | Discharge: 2017-02-12 | Payer: MEDICARE

## 2017-02-15 ENCOUNTER — Inpatient Hospital Stay: Admit: 2017-02-05 | Discharge: 2017-02-05 | Payer: MEDICARE

## 2017-02-15 DIAGNOSIS — N390 Urinary tract infection, site not specified: ICD-10-CM

## 2017-02-15 DIAGNOSIS — I82611 Acute embolism and thrombosis of superficial veins of right upper extremity: ICD-10-CM

## 2017-02-15 DIAGNOSIS — I481 Persistent atrial fibrillation: ICD-10-CM

## 2017-02-15 DIAGNOSIS — R001 Bradycardia, unspecified: ICD-10-CM

## 2017-02-15 DIAGNOSIS — K7290 Hepatic failure, unspecified without coma: ICD-10-CM

## 2017-02-15 DIAGNOSIS — K921 Melena: ICD-10-CM

## 2017-02-15 DIAGNOSIS — B9620 Unspecified Escherichia coli [E. coli] as the cause of diseases classified elsewhere: ICD-10-CM

## 2017-02-15 DIAGNOSIS — R578 Other shock: ICD-10-CM

## 2017-02-15 DIAGNOSIS — E7220 Disorder of urea cycle metabolism, unspecified: ICD-10-CM

## 2017-02-15 DIAGNOSIS — J90 Pleural effusion, not elsewhere classified: ICD-10-CM

## 2017-02-15 DIAGNOSIS — E1165 Type 2 diabetes mellitus with hyperglycemia: ICD-10-CM

## 2017-02-15 DIAGNOSIS — K31819 Angiodysplasia of stomach and duodenum without bleeding: ICD-10-CM

## 2017-02-15 DIAGNOSIS — I130 Hypertensive heart and chronic kidney disease with heart failure and stage 1 through stage 4 chronic kidney disease, or unspecified chronic kidney disease: ICD-10-CM

## 2017-02-15 DIAGNOSIS — E872 Acidosis: ICD-10-CM

## 2017-02-15 DIAGNOSIS — E785 Hyperlipidemia, unspecified: ICD-10-CM

## 2017-02-15 DIAGNOSIS — J9602 Acute respiratory failure with hypercapnia: ICD-10-CM

## 2017-02-15 DIAGNOSIS — D696 Thrombocytopenia, unspecified: ICD-10-CM

## 2017-02-15 DIAGNOSIS — E669 Obesity, unspecified: ICD-10-CM

## 2017-02-15 DIAGNOSIS — I25110 Atherosclerotic heart disease of native coronary artery with unstable angina pectoris: Principal | ICD-10-CM

## 2017-02-15 DIAGNOSIS — D62 Acute posthemorrhagic anemia: ICD-10-CM

## 2017-02-15 DIAGNOSIS — I808 Phlebitis and thrombophlebitis of other sites: ICD-10-CM

## 2017-02-15 DIAGNOSIS — E1122 Type 2 diabetes mellitus with diabetic chronic kidney disease: ICD-10-CM

## 2017-02-15 DIAGNOSIS — K2950 Unspecified chronic gastritis without bleeding: ICD-10-CM

## 2017-02-15 DIAGNOSIS — Z6841 Body Mass Index (BMI) 40.0 and over, adult: ICD-10-CM

## 2017-02-15 DIAGNOSIS — I21A1 Myocardial infarction type 2: ICD-10-CM

## 2017-02-15 DIAGNOSIS — K649 Unspecified hemorrhoids: ICD-10-CM

## 2017-02-15 DIAGNOSIS — I480 Paroxysmal atrial fibrillation: ICD-10-CM

## 2017-02-15 DIAGNOSIS — N179 Acute kidney failure, unspecified: ICD-10-CM

## 2017-02-15 DIAGNOSIS — E039 Hypothyroidism, unspecified: ICD-10-CM

## 2017-02-15 DIAGNOSIS — I5032 Chronic diastolic (congestive) heart failure: ICD-10-CM

## 2017-02-15 DIAGNOSIS — E1159 Type 2 diabetes mellitus with other circulatory complications: ICD-10-CM

## 2017-02-15 DIAGNOSIS — Z7984 Long term (current) use of oral hypoglycemic drugs: ICD-10-CM

## 2017-02-15 DIAGNOSIS — Z955 Presence of coronary angioplasty implant and graft: ICD-10-CM

## 2017-02-15 DIAGNOSIS — K760 Fatty (change of) liver, not elsewhere classified: ICD-10-CM

## 2017-02-15 DIAGNOSIS — E871 Hypo-osmolality and hyponatremia: ICD-10-CM

## 2017-02-15 DIAGNOSIS — N189 Chronic kidney disease, unspecified: ICD-10-CM

## 2017-02-15 DIAGNOSIS — I214 Non-ST elevation (NSTEMI) myocardial infarction: Secondary | ICD-10-CM

## 2017-02-15 LAB — POC GLUCOSE
Lab: 110 mg/dL — ABNORMAL HIGH (ref 70–100)
Lab: 199 mg/dL — ABNORMAL HIGH (ref 70–100)
Lab: 130 mg/dL — ABNORMAL HIGH (ref 70–100)
Lab: 170 mg/dL — ABNORMAL HIGH (ref 70–100)

## 2017-02-15 LAB — CBC: Lab: 6.7 10*3/uL — ABNORMAL LOW (ref 4.5–11.0)

## 2017-02-15 LAB — BASIC METABOLIC PANEL: Lab: 134 MMOL/L — ABNORMAL LOW (ref >60)

## 2017-02-15 MED ORDER — ROSUVASTATIN 40 MG PO TAB
40 mg | ORAL_TABLET | Freq: Every day | ORAL | 3 refills | Status: SS
Start: 2017-02-15 — End: 2017-03-14

## 2017-02-15 MED ORDER — INSULIN SYRINGE-NEEDLE U-100 0.3 ML 31 GAUGE X 5/16 MISC SYRG
Freq: Four times a day (QID) | 0 refills | Status: AC
Start: 2017-02-15 — End: ?
  Filled 2017-02-15 (×2): qty 100, 25d supply, fill #1

## 2017-02-15 MED ORDER — SENNOSIDES-DOCUSATE SODIUM 8.6-50 MG PO TAB
2 | ORAL_TABLET | Freq: Two times a day (BID) | ORAL | 0 refills | Status: SS
Start: 2017-02-15 — End: 2017-06-06

## 2017-02-15 MED ORDER — ASPIRIN 81 MG PO CHEW
81 mg | ORAL_TABLET | Freq: Every day | ORAL | 3 refills | Status: AC
Start: 2017-02-15 — End: 2017-06-19

## 2017-02-15 MED ORDER — TRAMADOL 50 MG PO TAB
50 mg | ORAL_TABLET | ORAL | 0 refills | Status: AC | PRN
Start: 2017-02-15 — End: ?

## 2017-02-15 MED ORDER — INSULIN NPH ISOPH U-100 HUMAN 100 UNIT/ML SC SUSP
14 [IU] | Freq: Every morning | SUBCUTANEOUS | 30 refills | 32.00000 days | Status: AC
Start: 2017-02-15 — End: ?
  Filled 2017-02-15: qty 10, 71d supply, fill #1
  Filled 2017-02-15 (×2): qty 10, 84d supply, fill #1
  Filled 2017-02-15: qty 10, 71d supply, fill #1

## 2017-02-15 MED ORDER — TRIAMTERENE-HYDROCHLOROTHIAZID 37.5-25 MG PO TAB
.5 | ORAL_TABLET | Freq: Every morning | ORAL | 0 refills | Status: AC
Start: 2017-02-15 — End: ?
  Filled 2017-02-15 (×2): qty 7, 14d supply, fill #1

## 2017-02-15 MED ORDER — METOPROLOL TARTRATE 50 MG PO TAB
50 mg | ORAL_TABLET | Freq: Two times a day (BID) | ORAL | 1 refills | Status: SS
Start: 2017-02-15 — End: 2017-03-14

## 2017-02-15 MED ORDER — INSULIN ASPART U-100 100 UNIT/ML SC SOLN
4 [IU] | Freq: Three times a day (TID) | SUBCUTANEOUS | 30 refills | 30.00000 days | Status: AC
Start: 2017-02-15 — End: ?

## 2017-02-15 MED ORDER — ACETAMINOPHEN 325 MG PO TAB
650 mg | ORAL | 0 refills | Status: AC | PRN
Start: 2017-02-15 — End: 2017-06-19

## 2017-02-15 NOTE — Progress Notes
02/15/17 1000   Cardiac Rehab Activity   Distance Walked (feet) 360 ft   BP Pre-activity 110/56   HR Pre-activity 101 bpm   HR Post-activity 80   SaO2 Pre-activity 96 %   SaO2 Post-activity 95   O2 Device None (Room Air)   Comments Pt ambulated with a wheeled walker. Pt stopped for 5 seated rest breaks. Pt tolerated ambulation well.    Mobility   Progressive Mobility Level 8   Level of Assistance Assist X1   Assistive Device Walker   Time Tolerated 0-10 minutes   Activity Limited By No limitations

## 2017-02-15 NOTE — Progress Notes
AOx4, VSS today per patient trend.   aflutter on tele.   UOP adequate, output inaccurate due to patient missing hat. educated pt and staff about using the hat.   Ambulated up SBA/walker.  Minimal c/o pain.  Persistant nausea, refusing zofran stating that she doesn't think it works for her. CTS made aware of her home phenergan dose.

## 2017-02-15 NOTE — Progress Notes
RT Adult Assessment Note    NAME:Diane Savage             MRN: [id number]             DOB:[birth year]          AGE: 61 y.o.  ADMISSION DATE: 01/31/2017             DAYS ADMITTED: LOS: 15 days    RT Treatment Plan:  Protocol Plan: Medications  Albuterol: Neb PRN    Protocol Plan: Procedures  PEP Therapy: Place a nursing order for "IS Q1h While Awake" for any of Lung Expansion indicators  PAP: Place a nursing order for "IS Q1h While Awake" for any of Lung Expansion indicators  IPPB: Place a nursing order for "IS Q1h While Awake" for any of Lung Expansion indicators  Oxygen/Humidity: O2 to keep SpO2 > 92%  Monitoring: Pulse oximetry BID & PRN      Vital Signs:  Pulse: Pulse: 97  RR: Respirations: 18 PER MINUTE  SpO2: SpO2: 92 %  O2 Device: $$ O2 Device: Standby  Liter Flow:    O2%: O2 Percent: 21 %  Breath Sounds: All Breath Sounds: Clear (implies normal);Decreased  Respiratory Effort: Respiratory Effort: Non-Labored

## 2017-02-15 NOTE — Case Management (ED)
Social Work Medication Voucher Note    Plan:   Discharge home today     Intervention:    Oncall SW reviewed discharge medications.  Completed medication voucher and patient assistance application.  Notified pt and bedside RN of voucher in [place]. SW spoke with CTS resident and requested medications be discussed due to cost for patient (private pay). Pharmacy contacted CTS and modified medications.   SW spoke with Pharmacy Manuela Schwartz) and confirmed voucher.     Summary of work with pt regarding plan for obtaining medications moving forward (pt assets/barriers/community resources provided):   Oncall SW spoke with RN-Maggie. Patient has seen diabetic educator and received instruction and education. SW encouraged RN to inform patient about [place] submitted voucher for the following meds:   NPH (1 vial)-$62.46    Novolog-$10.09   Crestor $11.60  Total Cost: 84.15    SW unable to assist with the following meds ($4.99/OTC/narcotic):   Patient stated she could get other medications. RN to inform patient about being able to bill copays     Ambrose Pancoast, LMSW  Pager: [phone]

## 2017-02-17 ENCOUNTER — Encounter: Admit: 2017-02-17 | Discharge: 2017-02-17 | Payer: MEDICARE

## 2017-02-18 ENCOUNTER — Encounter: Admit: 2017-02-18 | Discharge: 2017-02-18 | Payer: MEDICARE

## 2017-02-21 ENCOUNTER — Encounter: Admit: 2017-02-21 | Discharge: 2017-02-21 | Payer: MEDICARE

## 2017-02-21 NOTE — Telephone Encounter
Ok not to take Carafate. It has no effect on GAVE.

## 2017-02-21 NOTE — Telephone Encounter
Contacted pt on information below. Pt verbalized understanding.   Pt had no further questions or concerns.

## 2017-02-21 NOTE — Telephone Encounter
Pt left VM stating she was in hospital and d/c'ed on 02/15/17 and was wondering if she should still take Carafate. Pt stated she never received it at the hospital. She was found to have GAVE while being hospitalized for Chest pain and is no s/p CABG x2 with MAZE procedure. Pt called her PCP and other local doctors she sees in [place], [place]. All doctors informed her to call GI.    Pt stated Carafate never helped and questioned if it actually helps GAVE. Pt still not taking it. Pt to see Dr. Brigitte Pulse on Nov 14 for f/u.    Routing to Dr. Dolores Hoose. Caesar Chestnut to advise.

## 2017-02-25 NOTE — Progress Notes
Cardiac Rehab Call Back Note:  Attempted follow up call. No Answer. Left message  Are you tolerating activity?N/A  Is pain controlled?N/A  Is appetite normal?N/A  Are you having symptoms of heart discomfort?N/A  Are you having signs of infection at your incision sites or groin site?N/A  Do you want outpt cardiac rehab?N/A

## 2017-03-03 ENCOUNTER — Encounter: Admit: 2017-03-03 | Discharge: 2017-03-03 | Payer: MEDICARE

## 2017-03-03 NOTE — Progress Notes
Cardiac Rehab Call Back Note:  Pt reporting that she gets SOB with ambulation. Pt reporting she is only able to ambulated about 100 feet at a time due to SOB. Call transferred to [place] office, so pt can inform them of this.      Are you tolerating activity?See note above.   Is pain controlled?Pt reporting incisional pain. Pt reporting it is controlled with tylenol at tramadol.   Is appetite normal?No  Are you having symptoms of heart discomfort?Pt reporting incisional pain  Are you having signs of infection at your incision sites or groin site?No  Do you want outpt cardiac rehab? Pt does not have insurance coverage.

## 2017-03-03 NOTE — Telephone Encounter
Pt has a call into her Cardiologist but they recommended she contact our office. She is having some SOB but when asked, she states it is the same it has always been and she had it in the hospital too. Her weight is stable and her BP is stable, bur her HR has been in the 120s the last few reads.Advised pt that she contact her Cardiologist again and let them know her HR is tachycardic. Some of her meds were adjusted at DC so her Cardiologist might need to titrate her Metoprolol some. Pt verbalized understanding and will F/U with Dr Seward Speck.

## 2017-03-12 ENCOUNTER — Encounter: Admit: 2017-03-12 | Discharge: 2017-03-12 | Payer: MEDICARE

## 2017-03-12 ENCOUNTER — Ambulatory Visit: Admit: 2017-03-12 | Discharge: 2017-03-12 | Payer: MEDICARE

## 2017-03-12 ENCOUNTER — Ambulatory Visit: Admit: 2017-03-12 | Discharge: 2017-03-12

## 2017-03-12 DIAGNOSIS — E039 Hypothyroidism, unspecified: ICD-10-CM

## 2017-03-12 DIAGNOSIS — M1990 Unspecified osteoarthritis, unspecified site: ICD-10-CM

## 2017-03-12 DIAGNOSIS — H547 Unspecified visual loss: ICD-10-CM

## 2017-03-12 DIAGNOSIS — M549 Dorsalgia, unspecified: ICD-10-CM

## 2017-03-12 DIAGNOSIS — K319 Disease of stomach and duodenum, unspecified: ICD-10-CM

## 2017-03-12 DIAGNOSIS — D699 Hemorrhagic condition, unspecified: ICD-10-CM

## 2017-03-12 DIAGNOSIS — I10 Essential (primary) hypertension: ICD-10-CM

## 2017-03-12 DIAGNOSIS — I480 Paroxysmal atrial fibrillation: Principal | ICD-10-CM

## 2017-03-12 DIAGNOSIS — I2510 Atherosclerotic heart disease of native coronary artery without angina pectoris: Principal | ICD-10-CM

## 2017-03-12 DIAGNOSIS — E119 Type 2 diabetes mellitus without complications: ICD-10-CM

## 2017-03-12 NOTE — Progress Notes
Date of Service: 03/12/2017       Subjective:             Diane Savage is a 61 y.o. female.      History of Present Illness  Today we had the pleasure of seeing your patient, Diane Savage, in our office for routine postop follow up after coronary artery bypass times 4 with endoscopic vein harvesting with left internal mammary artery anastomosed to the LAD, reversed saphenous vein graft anastomosed in sequence to the obtuse marginal and left posterolateral arteries, and reversed saphenous vein graft anastomosed to the posterior descending branch of the right coronary artery, bilateral modified CryoMaze procedure (pulmonary vein isolation) and suture ligation of left atrial appendage performed by Dr. Brock Bad on 02/04/17. She had a complex hospital course and was seen preoperatively by GI and hematology for her history of gave as well as recurrent GI bleeding and multiple transfusions.  She did develop junctional rhythm postoperatively as well as atrial tachycardia and atrial fibrillation.  EP recommended outpatient follow-up.  She did develop a left pleural effusion which required a pigtail drain.  She was eventually discharged home.    Since discharge Diane Savage states she has been doing well. She has been walking daily minimally.  She has followed up with her cardiologist in [facility] Dr. Ruffin Frederick who recently saw her and performed an EKG which she states revealed atrial tachycardia with a heart rate of 120.  He recommended continue follow-up in December of this year.  She continues to have fatigue, shortness of breath, lightheadedness and dizziness.  She has had transfusions since discharge which she had preoperatively as well.  She had some left lower extremity edema and she states she had an ultrasound for this which they state were negative.    EKG performed in the office today revealed atrial flutter with a heart rate of 124. After lengthy discussions in the office between the patient, and EP and her home cardiologist we would recommend better rate control.  We did recommend admission to the hospital today in which she refused and would prefer to come back tomorrow morning.  Unfortunately, she is uninsured at this time and is medically stable.  We did contact her cardiologist office and they are going to medically manage her.  Patient was instructed if her rate continues to run in the 120s she should notify our office and may possibly need to present to the emergency room for further management.  She verbalized understanding and agreed with this plan.    Patient was informed that at 6 weeks from the date of surgery she may gradually increase the amount of weight that she is lifting. She should start at low weights and gradually work her way up. If there is no discomfort while doing it, then that is ok. If she has discomfort then she should stop. At 3 months from the date of surgery the sternum should be completely healed like it was never broken. If she has any questions or concerns she will notify our office otherwise we feel no further surgical follow-up is warranted. She will continue to follow with her PCP and cardiologist for continued care. Thank you for the opportunity to participate in the care of Diane Savage.       Review of Systems   Constitution: Negative.   HENT: Negative.    Eyes: Negative.    Cardiovascular: Positive for chest pain, claudication, dyspnea on exertion, leg swelling and paroxysmal  nocturnal dyspnea.   Respiratory: Negative.    Endocrine: Negative.    Hematologic/Lymphatic: Negative.    Skin: Positive for poor wound healing.   Musculoskeletal: Negative.    Gastrointestinal: Negative.    Genitourinary: Negative.    Neurological: Positive for dizziness.   Psychiatric/Behavioral: Negative.    Allergic/Immunologic: Negative.          Objective: ??? acetaminophen (TYLENOL) 325 mg tablet Take two tablets by mouth every 6 hours as needed.   ??? aspirin 81 mg chewable tablet Chew one tablet by mouth daily with food.   ??? cetirizine (ZYRTEC) 10 mg tablet Take 10 mg by mouth every morning.   ??? folic acid (FOLVITE) 1 mg tablet Take 1 mg by mouth daily.   ??? GLUCOSAMINE HCL/CHONDROITIN SU (GLUCOSAMINE-CHONDROITIN PO) Take 1 tablet by mouth twice daily.   ??? insulin aspart U-100 (NOVOLOG) 100 unit/mL injection Inject four Units under the skin three times daily with meals.   ??? insulin NPH (HUMULIN N NPH U-100 INSULIN) 100 unit/mL injection Inject fourteen Units under the skin every morning.   ??? Insulin Syringe-Needle U-100 (BD INSULIN SYRINGE ULTRA-FINE) 0.3 mL 31 gauge x 5/16 syrg Use four times daily with Insulin   ??? levothyroxine (SYNTHROID) 175 mcg tablet Take 175 mcg by mouth daily 30 minutes before breakfast.   ??? metoprolol tartrate (LOPRESSOR) 50 mg tablet Take one tablet by mouth twice daily.   ??? nitroglycerin (NITROSTAT) 0.4 mg tablet Place 0.4 mg under tongue every 5 minutes as needed for Chest Pain. Max of 3 tablets, call 911.   ??? omeprazole DR(+) (PRILOSEC) 40 mg capsule Take 40 mg by mouth twice daily.   ??? ondansetron (ZOFRAN) 8 mg tablet Take 8 mg by mouth every 8 hours as needed for Nausea or Vomiting.   ??? rosuvastatin (CRESTOR) 40 mg tablet Take one tablet by mouth daily.   ??? senna/docusate (SENOKOT-S) 8.6/50 mg tablet Take two tablets by mouth twice daily.   ??? sucralfate (CARAFATE) 1 gram tablet Take 1 g by mouth four times daily. Take on an empty stomach.   ??? traMADol (ULTRAM) 50 mg tablet Take one tablet by mouth every 6 hours as needed for Pain.     Vitals:    03/12/17 1318   BP: 130/80   Pulse: (!) 123   SpO2: 97%   Weight: 113.4 kg (250 lb)   Height: 1.626 m (5' 4)     Body mass index is 42.91 kg/m???.     Physical Exam   Constitutional: She is oriented to person, place, and time. She appears well-developed and well-nourished.   obese   HENT: Head: Normocephalic and atraumatic.   Eyes: Pupils are equal, round, and reactive to light. Conjunctivae and EOM are normal.   Neck: Normal range of motion. Neck supple.   Cardiovascular: Regular rhythm, S1 normal and S2 normal.  Tachycardia present.    Pulmonary/Chest: Effort normal. She has decreased breath sounds.   Abdominal: Soft. Bowel sounds are normal.   obese, round, soft, nontender   Musculoskeletal: Normal range of motion. Edema: bilateral LE 2+   Neurological: She is alert and oriented to person, place, and time.   Skin: Skin is warm and dry.   midsternal and left EVH incisions are well healed and well approximated without exudate, erythema, or swelling. Sternum is stable with cough.     Psychiatric: She has a normal mood and affect. Her behavior is normal. Judgment and thought content normal.     Hedwig Morton,  FNP-C 03/12/17 @ 1640.       Assessment and Plan:  We have the pleasure of seeing Aulani Slappey in the office for follow-up visit.  Ms. Notah underwent coronary bypass surgery in early September here at [facility] hospital.  Her postoperative course was complicated by atrial fibrillation.  She otherwise did fairly well.    On examination the office today she is comfortable and afebrile her heart rate is 124 and on EKG it seems she is in atrial tachycardia.  She says she has been feeling weak and out of energy.  Her heart sounds were normal and she has no murmur.  All of her incisions are healing well just good sternal stability.    We talked with Ms. Snowman and suggested that she be admitted to the hospital for control of her atrial tachycardia.  She declined this.  We have spoken to the office of her cardiologist in [facility] and she is to receive follow-up there.  We are happy to take care of her here if she changes her mind and wants to come back to be controlled.    Thank you for allowing [place] to participate in the care of this nice lady.

## 2017-03-13 ENCOUNTER — Emergency Department: Admit: 2017-03-13 | Discharge: 2017-03-13 | Payer: MEDICARE | Attending: Cardiovascular Disease

## 2017-03-13 ENCOUNTER — Encounter: Admit: 2017-03-13 | Discharge: 2017-03-13 | Payer: MEDICARE

## 2017-03-13 DIAGNOSIS — I25119 Atherosclerotic heart disease of native coronary artery with unspecified angina pectoris: ICD-10-CM

## 2017-03-13 DIAGNOSIS — I4891 Unspecified atrial fibrillation: Secondary | ICD-10-CM

## 2017-03-13 LAB — COMPREHENSIVE METABOLIC PANEL
Lab: 134 MMOL/L — ABNORMAL LOW (ref 137–147)
Lab: 163 mg/dL — ABNORMAL HIGH (ref 70–100)

## 2017-03-13 LAB — MAGNESIUM: Lab: 1.8 mg/dL — ABNORMAL LOW (ref 1.6–2.6)

## 2017-03-13 LAB — CBC AND DIFF
Lab: 0 10*3/uL (ref 0–0.20)
Lab: 0.3 10*3/uL (ref 0–0.45)
Lab: 4.8 10*3/uL (ref 4.5–11.0)

## 2017-03-13 LAB — BNP POC ER: Lab: 327 pg/mL — ABNORMAL HIGH (ref 0–100)

## 2017-03-13 LAB — POC TROPONIN: Lab: 0 ng/mL (ref 0.00–0.05)

## 2017-03-13 LAB — PHOSPHORUS: Lab: 3.5 mg/dL — ABNORMAL LOW (ref 2.0–4.5)

## 2017-03-13 MED ORDER — LACTATED RINGERS IV SOLP
1000 mL | INTRAVENOUS | 0 refills | Status: CP
Start: 2017-03-13 — End: ?
  Administered 2017-03-13: 22:00:00 1000 mL via INTRAVENOUS

## 2017-03-13 MED ORDER — INSULIN ASPART 100 UNIT/ML SC FLEXPEN
4 [IU] | Freq: Three times a day (TID) | SUBCUTANEOUS | 0 refills | Status: DC
Start: 2017-03-13 — End: 2017-03-14
  Administered 2017-03-14: 02:00:00 4 [IU] via SUBCUTANEOUS

## 2017-03-13 MED ORDER — METOPROLOL TARTRATE 50 MG PO TAB
100 mg | Freq: Two times a day (BID) | ORAL | 0 refills | Status: DC
Start: 2017-03-13 — End: 2017-03-28
  Administered 2017-03-14 – 2017-03-27 (×25): 100 mg via ORAL

## 2017-03-13 MED ORDER — CETIRIZINE 10 MG PO TAB
10 mg | Freq: Every morning | ORAL | 0 refills | Status: DC
Start: 2017-03-13 — End: 2017-03-28
  Administered 2017-03-14 – 2017-03-27 (×13): 10 mg via ORAL

## 2017-03-13 MED ORDER — PANTOPRAZOLE 40 MG PO TBEC
80 mg | Freq: Every day | ORAL | 0 refills | Status: DC
Start: 2017-03-13 — End: 2017-03-28
  Administered 2017-03-14 – 2017-03-27 (×11): 80 mg via ORAL

## 2017-03-13 MED ORDER — INSULIN ASPART U-100 100 UNIT/ML SC SOLN
4 [IU] | Freq: Three times a day (TID) | SUBCUTANEOUS | 0 refills | Status: DC
Start: 2017-03-13 — End: 2017-03-13

## 2017-03-13 MED ORDER — ROSUVASTATIN 20 MG PO TAB
40 mg | Freq: Every day | ORAL | 0 refills | Status: DC
Start: 2017-03-13 — End: 2017-03-28

## 2017-03-13 MED ORDER — FUROSEMIDE 20 MG PO TAB
20 mg | Freq: Every day | ORAL | 0 refills | Status: DC
Start: 2017-03-13 — End: 2017-03-14

## 2017-03-13 MED ORDER — NPH INSULIN HUMAN RECOMB 100 UNIT/ML (3 ML) SC PEN
14 [IU] | Freq: Every day | SUBCUTANEOUS | 0 refills | Status: DC
Start: 2017-03-13 — End: 2017-03-17
  Administered 2017-03-15: 16:00:00 14 [IU] via SUBCUTANEOUS

## 2017-03-13 MED ORDER — NITROGLYCERIN 0.4 MG SL SUBL
.4 mg | SUBLINGUAL | 0 refills | Status: DC | PRN
Start: 2017-03-13 — End: 2017-03-28

## 2017-03-13 MED ORDER — ASPIRIN 81 MG PO CHEW
81 mg | Freq: Every day | ORAL | 0 refills | Status: DC
Start: 2017-03-13 — End: 2017-03-28
  Administered 2017-03-14 – 2017-03-27 (×13): 81 mg via ORAL

## 2017-03-13 MED ORDER — IMS MIXTURE TEMPLATE
175 ug | Freq: Every day | ORAL | 0 refills | Status: DC
Start: 2017-03-13 — End: 2017-03-28
  Administered 2017-03-14 – 2017-03-27 (×30): 175 ug via ORAL

## 2017-03-13 MED ORDER — ACETAMINOPHEN 325 MG PO TAB
650 mg | ORAL | 0 refills | Status: DC | PRN
Start: 2017-03-13 — End: 2017-03-28
  Administered 2017-03-14 – 2017-03-22 (×14): 650 mg via ORAL

## 2017-03-13 MED ORDER — NYSTATIN 100,000 UNIT/GRAM TP POWD
Freq: Two times a day (BID) | TOPICAL | 0 refills | Status: DC
Start: 2017-03-13 — End: 2017-03-28
  Administered 2017-03-14: 14:00:00 via TOPICAL

## 2017-03-13 MED ORDER — FUROSEMIDE 10 MG/ML IJ SOLN
40 mg | Freq: Once | INTRAVENOUS | 0 refills | Status: CP
Start: 2017-03-13 — End: ?
  Administered 2017-03-14: 03:00:00 40 mg via INTRAVENOUS

## 2017-03-13 MED ORDER — SUCRALFATE 1 GRAM PO TAB
1 g | Freq: Four times a day (QID) | ORAL | 0 refills | Status: DC
Start: 2017-03-13 — End: 2017-03-27
  Administered 2017-03-14 – 2017-03-24 (×4): 1 g via ORAL

## 2017-03-13 MED ORDER — TRAMADOL 50 MG PO TAB
50 mg | ORAL | 0 refills | Status: DC | PRN
Start: 2017-03-13 — End: 2017-03-28
  Administered 2017-03-14 – 2017-03-27 (×15): 50 mg via ORAL

## 2017-03-13 MED ORDER — INSULIN NPH ISOPH U-100 HUMAN 100 UNIT/ML SC SUSP
14 [IU] | Freq: Every morning | SUBCUTANEOUS | 0 refills | Status: DC
Start: 2017-03-13 — End: 2017-03-13

## 2017-03-13 MED ORDER — ONDANSETRON HCL 4 MG PO TAB
8 mg | ORAL | 0 refills | Status: DC | PRN
Start: 2017-03-13 — End: 2017-03-28
  Administered 2017-03-14 – 2017-03-27 (×10): 8 mg via ORAL

## 2017-03-13 MED ORDER — FUROSEMIDE 10 MG/ML PO SOLN
20 mg | Freq: Every day | ORAL | 0 refills | Status: DC
Start: 2017-03-13 — End: 2017-03-14

## 2017-03-13 MED ORDER — SENNOSIDES-DOCUSATE SODIUM 8.6-50 MG PO TAB
2 | Freq: Two times a day (BID) | ORAL | 0 refills | Status: DC
Start: 2017-03-13 — End: 2017-03-28
  Administered 2017-03-14 – 2017-03-26 (×19): 2 via ORAL

## 2017-03-13 MED ORDER — FOLIC ACID 1 MG PO TAB
1 mg | Freq: Every day | ORAL | 0 refills | Status: DC
Start: 2017-03-13 — End: 2017-03-28
  Administered 2017-03-15 – 2017-03-27 (×12): 1 mg via ORAL

## 2017-03-13 NOTE — ED Notes
61 yo female presents to the ED with c/o SOA. Pt reports hx of CABG 1 month ago, afib, CHF. Pt state she's had symptoms of SOA since CABG. States symptoms worsened over passed week. Reports 7 lb weight gain over past week. States she was at a follow up appt yesterday and physician wanted to admit; states she was unable to yesterday. Pt A&Ox4. Awaiting MD eval.    Belongings: shirt, pants  Belongings at bedside.

## 2017-03-13 NOTE — H&P (View-Only)
Admission History and Physical Examination      Name:  Diane Savage                                             MRN:  [id number]   Admission Date:  03/13/2017                     Assessment/Plan:    Active Problems:    Atrial fibrillation (HCC)      Diane Savage is a 61 y.o. female, the patient has a past medical history of Acquired hypothyroidism; Arthritis; Back pain; Bleeding disorder (HCC); Coronary artery disease; Diabetes mellitus (HCC); Hypertension; Stomach disorder; and Vision decreased. presenting with shortness of breath, dizziness/lightheadedness, chills, nausea, lower extremity swelling and palpitations.    1. Atrial fibrillation, not on AC  - has had intermittent atrial flutter since CABG  - not on AC due to history of GIB  - has had palpitations for about 1 week prior to presentation, no CP or syncope  - EKG on admission: atrial fibrillation, rate 98, no ST changes   Plan for today:    > Continue ASA and Metoprolol   > rate controlled now, may require extra beta blocker overnight if she becomes persistently tachy    2. Shortness of Breath/Lower extremity swelling, concern for DVT  - LLE swelling greater than right since surgery  - has history of DVT  - BNP 327 on admission   - CXR with persistent left sided pleural effusion    Plan for today:   > Follow up LE [place], if positive, will cautiously start heparin   > 40 IV lasix given, may need more overnight   > may need CTA overnight    > With concern for clot and vague neuro complaints, will get CT head, discuss with neuro  overnight if needed    3. 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 History of GIB  - has had extensive work up and evaluation by GI - frequent upper/lower GIB  - requires frequent blood transfusions   Plan for today:    > Consult GI as anticoagulation will likely be needed   > Will send for type and cross now    5. Hypothyroidism  > Continue Synthroid    6. HLD  > Continue PTA statin    7. HTN  > Continue metoprolol    8. DMII   - PTA regimen: NPH 14u QD, Novolog 4u TID w/ meals  > Continue PTA regimen    Fluids, electrolytes and Nutrition:  IVF: no IVF  Electrolytes: Monitor and replace PRN.  Diet:  NPO at MN    Prophylaxis:  DVT: will start heparin if patient has DVT  Stress ulcer: PPI    Code status: Full Code  Disposition: admit to [facility]    Patient seen and discussed with Dr. Benedict Needy.  ________________________________________________________________________  Primary Care Physician: Sherre Lain    Chief Complaint:  Shortness of breath, lower extremity swelling, fatigue  Subjective:  Diane Savage is a 61 y.o. female, the patient has a past medical history of Acquired hypothyroidism; Arthritis; Back pain; Bleeding disorder (HCC); Coronary artery disease; Diabetes mellitus (HCC); Hypertension; Stomach disorder; and Vision decreased. presenting with shortness of breath, dizziness/lightheadedness, chills, nausea, lower extremity swelling and palpitations.    She states that she recently had coronary artery bypass on 02/04/17 here at [facility].  She states that her post surgical hospital stay was complicated by atrial fibrillation and atrial flutter, as well as a pleural effusion.  She states that she was discharged home and initially had done well after surgery.  She states that about a week after discharge she began to develop shortness of breath, lower extremity swelling, palpitations, and dizziness and lightheadedness.  She states that over the following weeks these symptoms have worsened.  She states that they have significantly worsened over the last week and notes the development of chills, nausea, worsening paroxysmal nocturnal dyspnea, and a 7 pound weight gain.  She states that she was evaluated by Dr. Blenda Brideg[facility] yesterday and was recommended at that time that she be admitted to the hospital for further workup and treatment.  She states that since they live in [place] they declined admission at the time of that as they did not have anything with them.  Due to her worsening symptomatology, they read presented to the emergency department tonight for further evaluation.    Again, she states that she has shortness of breath with minimal exertion.  She denies any overt chest pain with this but does state that she has some superficial tenderness surrounding her surgical incision.  This is not been warm or had any drainage.  Again, she notes dizziness, lightheadedness, and palpitations.  She has not had any syncope.  She does note that nightly she has to get up to the side of the bed because she is short of breath.  She sleeps on multiple pillows but the elevation at which she sleeps has not changed.  Finally, she notes progressive lower extremity swelling and a 7 pound weight gain.  She states that her left leg is significantly more swollen than her right but attributed that to the saphenous vein graft harvesting for her surgery.  She states that she has a history of DVTs and this swelling and tenderness in her left lower extremity feels similar to these prior incidents.  In regards to her chronic medical disease, she states that she has a rare GI disorder that causes her to have GI bleeds frequently.  She states that she has had one dark stool last week but does not have any frank blood.  She states that she requires multiple transfusions the last one being last Tuesday.  She states that following this her hemoglobin was 9.  She frequently requires blood transfusions and for this fact and her history of GI bleeds she has not been anticoagulated.  She is only taking aspirin at this time.  She also has hypertension, hyperlipidemia, and hypothyroidism for which she takes medications.  She is also taking an insulin regimen for her diabetes.    Finally, we discussed the risk of stroke in patients with atrial fibrillation you are not anticoagulated.  She does state that she has had intermittent word slurring since her surgery.  This is confirmed by her husband who is with her here today.  She also states that her daughter has noticed that she slurs her words periodically.  She also states that her  voice has changed since surgery.  She denies any facial droop or weakness/numbness in any of her extremities.  We discussed her complex medical presentation and needing to be admitted to the hospital for further evaluation of a potential blood clot in her leg, further workup of her atrial fibrillation, and evaluation of her shortness of breath.  This will include consults to gastroenterology as well as cardiothoracic surgery.  She is agreeable to this and does not have any questions.    ROS:   A comprehensive 14-point ROS was negative except for: shortness of breath, dizziness/lightheadedness, chills, nausea, PND, lower extremity swelling R>L      Past Medical History:   Diagnosis Date   ??? Acquired hypothyroidism    ??? Arthritis    ??? Back pain    ??? Bleeding disorder (HCC)    ??? Coronary artery disease    ??? Diabetes mellitus (HCC)     Type II   ??? Hypertension    ??? Stomach disorder    ??? Vision decreased      Past Surgical History:   Procedure Laterality Date   ??? HX HEART CATHETERIZATION  2017   ??? CORONARY STENT PLACEMENT  2017   ??? CORONARY ARTERY BYPASS GRAFT N/A 02/04/2017    CORONARY ARTERY BYPASS WITH ARTERIAL GRAFT - 4 GRAFTS (Internal Mammary Artery and Endovascular Vein Harvest) performed by Collene Schlichter, MD at [facility]   ??? HX MAZE N/A 02/04/2017    MAZE PROCEDURE performed by Collene Schlichter, MD at [facility]   ??? ANKLE SURGERY      Left ankle reconstruction   ??? COLONOSCOPY     ??? HX KNEE ARTHROSCOPY Left ??? HX TONSIL AND ADENOIDECTOMY     ??? TUBAL LIGATION     ??? UPPER GASTROINTESTINAL ENDOSCOPY       Family History   Problem Relation Age of Onset   ??? Diabetes Mother    ??? Heart Disease Mother    ??? High Cholesterol Mother    ??? Arthritis-rheumatoid Mother    ??? Stroke Mother    ??? Thyroid Disease Mother    ??? Coronary Artery Disease Mother    ??? Hyperlipidemia Mother    ??? Cancer Father    ??? Cancer-Breast Sister    ??? Cancer Sister    ??? Hypertension Sister    ??? Heart Disease Sister    ??? High Cholesterol Sister    ??? Arthritis-osteo Sister    ??? Migraines Sister    ??? Rashes/Skin Problems Sister    ??? Depression Sister    ??? Hyperlipidemia Sister    ??? Cancer Brother    ??? Hypertension Brother    ??? Arthritis-rheumatoid Brother    ??? Rashes/Skin Problems Brother    ??? Coronary Artery Disease Brother    ??? Heart Disease Maternal Aunt    ??? Coronary Artery Disease Paternal Uncle    ??? Hyperlipidemia Paternal Uncle    ??? Cancer Maternal Grandmother    ??? Diabetes Maternal Grandmother    ??? Heart Disease Maternal Grandmother    ??? Cancer-Colon Paternal Grandmother    ??? Cancer Paternal Grandmother    ??? Diabetes Paternal Grandmother    ??? Cancer Paternal Grandfather      Social History     Social History   ??? Marital status: Married     Spouse name: N/A   ??? Number of children: N/A   ??? Years of education: N/A     Social History Main Topics   ???  Smoking status: Former Smoker     Packs/day: 2.00     Years: 10.00     Types: Cigarettes     Quit date: 09/16/1980   ??? Smokeless tobacco: Never Used   ??? Alcohol use Yes      Comment: Seldom   ??? Drug use: No   ??? Sexual activity: Not on file     Other Topics Concern   ??? Not on file     Social History Narrative   ??? No narrative on file       Allergies:   Diltiazem; Sulfa (sulfonamide antibiotics); Duricef [cefadroxil]; Pravastatin; and Simvastatin    Medications:  No current facility-administered medications on file prior to encounter.      Current Outpatient Prescriptions on File Prior to Encounter Medication Sig Dispense Refill   ??? acetaminophen (TYLENOL) 325 mg tablet Take two tablets by mouth every 6 hours as needed.  0   ??? aspirin 81 mg chewable tablet Chew one tablet by mouth daily with food. 90 tablet 3   ??? cetirizine (ZYRTEC) 10 mg tablet Take 10 mg by mouth every morning.     ??? folic acid (FOLVITE) 1 mg tablet Take 1 mg by mouth daily.     ??? GLUCOSAMINE HCL/CHONDROITIN SU (GLUCOSAMINE-CHONDROITIN PO) Take 1 tablet by mouth twice daily.     ??? insulin aspart U-100 (NOVOLOG) 100 unit/mL injection Inject four Units under the skin three times daily with meals. 10 mL 30   ??? insulin NPH (HUMULIN N NPH U-100 INSULIN) 100 unit/mL injection Inject fourteen Units under the skin every morning. 10 mL 30   ??? Insulin Syringe-Needle U-100 (BD INSULIN SYRINGE ULTRA-FINE) 0.3 mL 31 gauge x 5/16 syrg Use four times daily with Insulin 100 each 0   ??? levothyroxine (SYNTHROID) 175 mcg tablet Take 175 mcg by mouth daily 30 minutes before breakfast.     ??? metoprolol tartrate (LOPRESSOR) 50 mg tablet Take one tablet by mouth twice daily. 30 tablet 1   ??? nitroglycerin (NITROSTAT) 0.4 mg tablet Place 0.4 mg under tongue every 5 minutes as needed for Chest Pain. Max of 3 tablets, call 911.     ??? omeprazole DR(+) (PRILOSEC) 40 mg capsule Take 40 mg by mouth twice daily.     ??? ondansetron (ZOFRAN) 8 mg tablet Take 8 mg by mouth every 8 hours as needed for Nausea or Vomiting.     ??? rosuvastatin (CRESTOR) 40 mg tablet Take one tablet by mouth daily. 90 tablet 3   ??? senna/docusate (SENOKOT-S) 8.6/50 mg tablet Take two tablets by mouth twice daily. 20 tablet 0   ??? sucralfate (CARAFATE) 1 gram tablet Take 1 g by mouth four times daily. Take on an empty stomach.     ??? traMADol (ULTRAM) 50 mg tablet Take one tablet by mouth every 6 hours as needed for Pain. 30 tablet 0       Physical Exam:    Vitals:  Vital Signs: Last Filed In 24 Hours Vital Signs: 24 Hour Range   BP: 118/71 (10/11 1714)  Temp: 36.4 ???C (97.6 ???F) (10/11 1433) Pulse: 110 (10/11 1713)  Respirations: 20 PER MINUTE (10/11 1600)  SpO2: 95 % (10/11 1713)  O2 Delivery: None (Room Air) (10/11 1433)  SpO2 Pulse: 108 (10/11 1713)  Height: 162.6 cm (64) (10/11 1433) BP: (118-124)/(71-76)   Temp:  [36.4 ???C (97.6 ???F)]   Pulse:  [96-110]   Respirations:  [18 PER MINUTE-20 PER MINUTE]   SpO2:  [95 %-  100 %]   O2 Delivery: None (Room Air)          GEN: Alert and oriented x 4, no acute distress, cooperative, appropriately participative in exam.   HEAD: Head is normocephalic and atraumatic.  EYES: Pupils equal and reactive to light. Conjunctiva and sclera are clear. Extraocular movements are intact bilaterally.  ENT:  Ears are clear bilaterally. Oropharynx pink and moist.  CHEST: Lungs, clear to auscultation bilaterally with no wheezes, rales or rhonchi. No accessory muscle use or respiratory distress. Well healing midline sternotomy scar without any obvious signs of infection   CV: Irregularly irregular rhythm. S1 and S2 noted. No murmurs noted. BUE/BLE pulses 2+.   ABD: Obese, Soft, non-tender, non-distended. Bowel Sounds present. No rebound tenderness, no guarding.   MSK: Adequately aligned spine. ROM intact spine and extremities. No joint erythema or tenderness. Normal muscular development.   EXTRM: No significant deformity or joint abnormality. Significant lower extremity edema L>R, well healing saphenous vein graft harvesting scar  HEME/LYMPH: No active bleeding. No cervical, supraclavicular or infraclavicular lymphadenopathy appreciated.   SKIN: No rashes or lesions.  NEURO: CN II-XII grossly intact. Behavior, speech, mood, thought content appropriate. Sensation and strength grossly intact throughout.     Lab/Radiology/Other Diagnostic Tests:  Recent Labs      03/13/17   1607   NA  134*   K  4.4   CL  104   CO2  25   GAP  5   BUN  27*   CR  1.62*   GLU  163*   CA  9.5   ALBUMIN  3.5   MG  1.8   PO4  3.5       Recent Labs      03/13/17   1607   WBC  4.8   HGB  8.5*   HCT  27.1* PLTCT  157   AST  16   ALT  7   ALKPHOS  73      Estimated Creatinine Clearance: 45.6 mL/min (A) (based on SCr of 1.62 mg/dL (H)).  Vitals:    03/13/17 1433   Weight: 113.8 kg (250 lb 12.8 oz)    No results for input(s): PHART, PO2ART in the last 72 hours.    Invalid input(s): PC02A    Glucose: (!) 163 (03/13/17 1607)  Pertinent radiology reviewed    Laban Emperor, MD

## 2017-03-13 NOTE — Telephone Encounter
Pt called to report that her HR is still 118-119 this AM after taking an extra dose of 50 mg Metoprolol last night and an increased dose of 100 mg Metoprolol this AM (as prescribed by her Primary Cardiologist). Pt is SOB and lightheaded. She was advised by Dr Alfonso Ramus yesterday to return to the ER for admission if  She continued to have symptomatic tachycardia. Advised pt to seek treatment in local ER if having intolerable symptoms. Pt stated they would drive to [place] ER for treatment.

## 2017-03-13 NOTE — ED Notes
1805: Room HC502-01 (Ready). Please call Burman Nieves, RN @ [phone] for report.

## 2017-03-14 ENCOUNTER — Encounter: Admit: 2017-03-14 | Discharge: 2017-03-14 | Payer: MEDICARE

## 2017-03-14 ENCOUNTER — Inpatient Hospital Stay: Admit: 2017-03-14 | Discharge: 2017-03-14 | Payer: MEDICARE

## 2017-03-14 DIAGNOSIS — I4891 Unspecified atrial fibrillation: Secondary | ICD-10-CM

## 2017-03-14 LAB — CBC AND DIFF
Lab: 0 % — ABNORMAL LOW (ref >60)
Lab: 0 10*3/uL (ref 0–0.20)
Lab: 0.4 10*3/uL (ref 0–0.45)
Lab: 0.5 10*3/uL (ref 0–0.80)
Lab: 0.7 10*3/uL — ABNORMAL LOW (ref 1.0–4.8)
Lab: 13 % — ABNORMAL LOW (ref 24–44)
Lab: 18 % — ABNORMAL HIGH (ref 11–15)
Lab: 2.5 M/UL — ABNORMAL LOW (ref 4.0–5.0)
Lab: 24 % — ABNORMAL LOW (ref 36–45)
Lab: 31 g/dL — ABNORMAL LOW (ref 32.0–36.0)
Lab: 4 10*3/uL (ref 1.8–7.0)
Lab: 5.6 K/UL — ABNORMAL LOW (ref 4.5–11.0)
Lab: 6 % — ABNORMAL HIGH (ref >60)
Lab: 8 FL (ref 7–11)

## 2017-03-14 LAB — POC GLUCOSE
Lab: 190 mg/dL — ABNORMAL HIGH (ref 70–100)
Lab: 210 mg/dL — ABNORMAL HIGH (ref 70–100)
Lab: 177 mg/dL — ABNORMAL HIGH (ref 70–100)

## 2017-03-14 LAB — TROPONIN-I
Lab: 0 ng/mL (ref 0.0–0.05)
Lab: 0 ng/mL (ref 0.0–0.05)

## 2017-03-14 LAB — COMPREHENSIVE METABOLIC PANEL
Lab: 136 MMOL/L — ABNORMAL LOW (ref 137–147)
Lab: 4.4 MMOL/L (ref 3.5–5.1)

## 2017-03-14 LAB — PHOSPHORUS: Lab: 3.3 mg/dL (ref 2.0–4.5)

## 2017-03-14 LAB — LACTIC ACID (BG - RAPID LACTATE): Lab: 1.3 MMOL/L (ref 0.5–2.0)

## 2017-03-14 LAB — MAGNESIUM: Lab: 1.7 mg/dL (ref 1.6–2.6)

## 2017-03-14 MED ORDER — FUROSEMIDE IV DRIP SYR (MAX CONC)
5 mg/h | INTRAVENOUS | 0 refills | Status: DC
Start: 2017-03-14 — End: 2017-03-16
  Administered 2017-03-14 – 2017-03-15 (×2): 5 mg/h via INTRAVENOUS

## 2017-03-14 MED ORDER — FUROSEMIDE 10 MG/ML IJ SOLN
40 mg | Freq: Once | INTRAVENOUS | 0 refills | Status: AC
Start: 2017-03-14 — End: ?

## 2017-03-14 MED ORDER — FENTANYL CITRATE (PF) 50 MCG/ML IJ SOLN
50 ug | Freq: Once | INTRAVENOUS | 0 refills | Status: CP
Start: 2017-03-14 — End: ?
  Administered 2017-03-14: 17:00:00 25 ug via INTRAVENOUS

## 2017-03-14 MED ORDER — INSULIN ASPART 100 UNIT/ML SC FLEXPEN
0-7 [IU] | Freq: Before meals | SUBCUTANEOUS | 0 refills | Status: DC
Start: 2017-03-14 — End: 2017-03-16

## 2017-03-14 MED ORDER — MIDAZOLAM 1 MG/ML IJ SOLN
1-2 mg | Freq: Once | INTRAVENOUS | 0 refills | Status: CP
Start: 2017-03-14 — End: ?
  Administered 2017-03-14: 17:00:00 2 mg via INTRAVENOUS

## 2017-03-14 MED ORDER — MIDAZOLAM 1 MG/ML IJ SOLN
0 refills | Status: CP
Start: 2017-03-14 — End: ?
  Administered 2017-03-14 (×2): 1 mg via INTRAVENOUS

## 2017-03-14 MED ORDER — AMOXICILLIN 500 MG PO CAP
500 mg | Freq: Three times a day (TID) | ORAL | 0 refills | Status: DC
Start: 2017-03-14 — End: 2017-03-17
  Administered 2017-03-14 – 2017-03-17 (×8): 500 mg via ORAL

## 2017-03-14 MED ORDER — FENTANYL CITRATE (PF) 50 MCG/ML IJ SOLN
0 refills | Status: CP
Start: 2017-03-14 — End: ?
  Administered 2017-03-14: 17:00:00 50 ug via INTRAVENOUS
  Administered 2017-03-14: 17:00:00 25 ug via INTRAVENOUS

## 2017-03-14 NOTE — Progress Notes
First chest xray read by Dr. Earvin Hansen, Muse for patient to return to floor.  IV team paged for new IV placement, as her IV infiltrated during procedure.

## 2017-03-14 NOTE — Progress Notes
GI Consult Note      Admission Diagnosis: Atrial fibrillation [facility]) [I48.91]    Admission Date: 03/13/2017    Reason for Consult: history of GI bleed, recent CABG, presenting with SOB/atrial fibrillation and concern for DVT, please follow and advise on risk/benifit of potential anticoagulation    HPI  61 y.o. female initially p/w SOB, AF, possible DVT who we are consulted for the above. 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    Patient has a history of GI bleeding in the past and has been seen by GI in the outpatient setting 10/2016.  Previously had endoscopy performed at outside facility.  Prior EGD showing erythematous mucosa throughout the stomach and in the antrum.  Gastric biopsies showing ectatic superficial vessels and chronic gastritis no H. pylori.  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  Capsule endoscopy performed 07/2016 showing incomplete findings of the battery died and there were no complete recordings.  At that time there was plans to postpone endoscopic procedures for 3-6 months due to patient's recent  NSTEMI at that time.     Patient is not currently on anticoagulation.  She denies a history of overt GI bleeding currently.  She denies hematemesis, coffee-ground emesis, melena, hematochezia.  Since her CABG the patient has been in and out of atrial fibrillation/flutter and cardiology is considering anticoagulation but is concerned due to her prior history of GI bleed. She was last transfused one week prior.  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   Eyes: Negative  Respiratory: Negative  Cardiovascular: Negative  Gastrointestinal: as above  Endocrine: Negative  Genitourinary: Negative  Musculoskeletal: Negative  Skin: Negative  Allergic/Immunologic: Negative Neurological: Negative  Hematological: Negative  Psychiatric/Behavioral: Negative  All other systems reviewed and are negative    Past Medical History:   Diagnosis Date   ??? Acquired hypothyroidism    ??? Arthritis    ??? Back pain    ??? Bleeding disorder (HCC)    ??? Coronary artery disease    ??? Diabetes mellitus (HCC)     Type II   ??? Hypertension    ??? Stomach disorder    ??? Vision decreased      Past Surgical History:   Procedure Laterality Date   ??? HX HEART CATHETERIZATION  2017   ??? CORONARY STENT PLACEMENT  2017   ??? CORONARY ARTERY BYPASS GRAFT N/A 02/04/2017    CORONARY ARTERY BYPASS WITH ARTERIAL GRAFT - 4 GRAFTS (Internal Mammary Artery and Endovascular Vein Harvest) performed by Collene Schlichter, MD at [facility]   ??? HX MAZE N/A 02/04/2017    MAZE PROCEDURE performed by Collene Schlichter, MD at [facility]   ??? ANKLE SURGERY      Left ankle reconstruction   ??? COLONOSCOPY     ??? HX KNEE ARTHROSCOPY Left    ??? HX TONSIL AND ADENOIDECTOMY     ??? TUBAL LIGATION     ??? UPPER GASTROINTESTINAL ENDOSCOPY       Family History   Problem Relation Age of Onset   ??? Diabetes Mother    ??? Heart Disease Mother    ??? High Cholesterol Mother    ??? Arthritis-rheumatoid Mother    ??? Stroke Mother    ??? Thyroid Disease Mother    ??? Coronary Artery Disease Mother    ???  Hyperlipidemia Mother    ??? Cancer Father    ??? Cancer-Breast Sister    ??? Cancer Sister    ??? Hypertension Sister    ??? Heart Disease Sister    ??? High Cholesterol Sister    ??? Arthritis-osteo Sister    ??? Migraines Sister    ??? Rashes/Skin Problems Sister    ??? Depression Sister    ??? Hyperlipidemia Sister    ??? Cancer Brother    ??? Hypertension Brother    ??? Arthritis-rheumatoid Brother    ??? Rashes/Skin Problems Brother    ??? Coronary Artery Disease Brother    ??? Heart Disease Maternal Aunt    ??? Coronary Artery Disease Paternal Uncle    ??? Hyperlipidemia Paternal Uncle    ??? Cancer Maternal Grandmother    ??? Diabetes Maternal Grandmother    ??? Heart Disease Maternal Grandmother ??? Cancer-Colon Paternal Grandmother    ??? Cancer Paternal Grandmother    ??? Diabetes Paternal Grandmother    ??? Cancer Paternal Grandfather      Social History     Social History   ??? Marital status: Married     Spouse name: N/A   ??? Number of children: N/A   ??? Years of education: N/A     Occupational History   ??? Not on file.     Social History Main Topics   ??? Smoking status: Former Smoker     Packs/day: 2.00     Years: 10.00     Types: Cigarettes     Quit date: 09/16/1980   ??? Smokeless tobacco: Never Used   ??? Alcohol use Yes      Comment: Seldom   ??? Drug use: No   ??? Sexual activity: Not on file     Other Topics Concern   ??? Not on file     Social History Narrative   ??? No narrative on file     Allergies   Allergen Reactions   ??? Diltiazem RASH, NAUSEA AND VOMITING and SEE COMMENTS     Sore mouth   ??? Sulfa (Sulfonamide Antibiotics) RASH   ??? Duricef [Cefadroxil] NAUSEA AND VOMITING   ??? Pravastatin NAUSEA AND VOMITING   ??? Simvastatin NAUSEA AND VOMITING       Current Facility-Administered Medications:   ???  acetaminophen (TYLENOL) tablet 650 mg, 650 mg, Oral, Q6H PRN, Laban Emperor, MD, 650 mg at 03/13/17 2128  ???  aspirin chewable tablet 81 mg, 81 mg, Oral, QDAY, Laban Emperor, MD  ???  cetirizine (ZYRTEC) tablet 10 mg, 10 mg, Oral, Wetzel Bjornstad, Laban Emperor, MD  ???  folic acid (FOLVITE) tablet 1 mg, 1 mg, Oral, QDAY, Laban Emperor, MD  ???  furosemide (LASIX) tablet 20 mg, 20 mg, Oral, QDAY, Laban Emperor, MD  ???  insulin aspart U-100 (NOVOLOG FLEXPEN) injection PEN 0-7 Units, 0-7 Units, Subcutaneous, ACHS, Subramanian, Praveen, DO  ???  insulin NPH (HUMULIN N KwikPen) injection PEN 14 Units, 14 Units, Subcutaneous, QDAY(07), Lenice Llamas, MD  ???  levothyroxine (SYNTHROID) tablet 175 mcg, 175 mcg, Oral, QDAY 30 min before breakfast, Laban Emperor, MD, 175 mcg at 03/14/17 1610  ???  metoprolol tartrate (LOPRESSOR) tablet 100 mg, 100 mg, Oral, BID, Laban Emperor, MD, 100 mg at 03/13/17 2129 ???  nitroglycerin (NITROSTAT) tablet 0.4 mg, 0.4 mg, Sublingual, Q5 MIN PRN, Laban Emperor, MD  ???  nystatin (NYSTOP) topical powder, , Topical, BID, Poplin, Benetta Spar A, MD  ???  ondansetron (ZOFRAN) tablet 8 mg, 8 mg, Oral, Q8H  PRN, Laban Emperor, MD, 8 mg at 03/14/17 1610  ???  pantoprazole DR (PROTONIX) tablet 80 mg, 80 mg, Oral, QDAY(21), Laban Emperor, MD, 80 mg at 03/13/17 2129  ???  rosuvastatin (CRESTOR) tablet 40 mg, 40 mg, Oral, QDAY, Laban Emperor, MD  ???  senna/docusate (SENOKOT-S) tablet 2 tablet, 2 tablet, Oral, BID, Laban Emperor, MD  ???  sucralfate (CARAFATE) tablet 1 g, 1 g, Oral, QID, Laban Emperor, MD, 1 g at 03/13/17 2129  ???  traMADol (ULTRAM) tablet 50 mg, 50 mg, Oral, Q6H PRN, Laban Emperor, MD    Physical Exam  Gen: AOx3, NAD  HEENT: anicteric sclerae, no tracheal deviation  Cardiac: irregular rate and rhythm, intact distal pulses  Resp: speaking in full sentences, no respiratory distress  GI: positive bowel sounds, non TTP, non-distended, no guarding, no hepatosplenomegaly  Ext: no peripheral edema, no deformities b/l LEs  Neuro: no focal abnormalities  Psych: pleasant mood and affect    Labs  24-hour labs:    Results for orders placed or performed during the hospital encounter of 03/13/17 (from the past 24 hour(s))   CBC AND DIFF    Collection Time: 03/13/17  4:07 PM   Result Value Ref Range    White Blood Cells 4.8 4.5 - 11.0 K/UL    RBC 2.81 (L) 4.0 - 5.0 M/UL    Hemoglobin 8.5 (L) 12.0 - 15.0 GM/DL    Hematocrit 96.0 (L) 36 - 45 %    MCV 96.3 80 - 100 FL    MCH 30.2 26 - 34 PG    MCHC 31.3 (L) 32.0 - 36.0 G/DL    RDW 45.4 (H) 11 - 15 %    Platelet Count 157 150 - 400 K/UL    MPV 8.1 7 - 11 FL    Neutrophils 68 41 - 77 %    Lymphocytes 17 (L) 24 - 44 %    Monocytes 9 4 - 12 %    Eosinophils 6 (H) 0 - 5 %    Basophils 0 0 - 2 %    Absolute Neutrophil Count 3.20 1.8 - 7.0 K/UL    Absolute Lymph Count 0.80 (L) 1.0 - 4.8 K/UL    Absolute Monocyte Count 0.50 0 - 0.80 K/UL Absolute Eosinophil Count 0.30 0 - 0.45 K/UL    Absolute Basophil Count 0.00 0 - 0.20 K/UL   COMPREHENSIVE METABOLIC PANEL    Collection Time: 03/13/17  4:07 PM   Result Value Ref Range    Sodium 134 (L) 137 - 147 MMOL/L    Potassium 4.4 3.5 - 5.1 MMOL/L    Chloride 104 98 - 110 MMOL/L    Glucose 163 (H) 70 - 100 MG/DL    Blood Urea Nitrogen 27 (H) 7 - 25 MG/DL    Creatinine 0.98 (H) 0.4 - 1.00 MG/DL    Calcium 9.5 8.5 - 11.9 MG/DL    Total Protein 7.6 6.0 - 8.0 G/DL    Total Bilirubin 0.6 0.3 - 1.2 MG/DL    Albumin 3.5 3.5 - 5.0 G/DL    Alk Phosphatase 73 25 - 110 U/L    AST (SGOT) 16 7 - 40 U/L    CO2 25 21 - 30 MMOL/L    ALT (SGPT) 7 7 - 56 U/L    Anion Gap 5 3 - 12    eGFR Non African American 32 (L) >60 mL/min    eGFR African American 39 (L) >60 mL/min   MAGNESIUM  Collection Time: 03/13/17  4:07 PM   Result Value Ref Range    Magnesium 1.8 1.6 - 2.6 mg/dL   PHOSPHORUS    Collection Time: 03/13/17  4:07 PM   Result Value Ref Range    Phosphorus 3.5 2.0 - 4.5 MG/DL   POC TROPONIN    Collection Time: 03/13/17  4:09 PM   Result Value Ref Range    Troponin-I-POC 0.00 0.00 - 0.05 NG/ML   BNP POC ER    Collection Time: 03/13/17  4:16 PM   Result Value Ref Range    BNP POC 327.0 (H) 0 - 100 PG/ML   TROPONIN-I    Collection Time: 03/13/17  8:53 PM   Result Value Ref Range    Troponin-I 0.01 0.0 - 0.05 NG/ML   CULTURE-BLOOD W/SENSITIVITY    Collection Time: 03/13/17  8:53 PM   Result Value Ref Range    Battery Name BLOOD CULTURE     Specimen Description BLOOD  LEFT  ANTECUBITAL       Special Requests NONE     Culture NO GROWTH 1 DAY     Report Status     LACTIC ACID (BG - RAPID LACTATE)    Collection Time: 03/13/17  8:53 PM   Result Value Ref Range    Lactic Acid,BG 1.3 0.5 - 2.0 MMOL/L   TYPE & CROSSMATCH    Collection Time: 03/13/17  8:53 PM   Result Value Ref Range    Units Ordered 0     Crossmatch Expires 03/16/2017     Record Check FOUND     ABO/RH(D) A POS     Antibody Screen NEG    MAGNESIUM Collection Time: 03/13/17  8:53 PM   Result Value Ref Range    Magnesium 1.7 1.6 - 2.6 mg/dL   PHOSPHORUS    Collection Time: 03/13/17  8:53 PM   Result Value Ref Range    Phosphorus 3.3 2.0 - 4.5 MG/DL   CULTURE-BLOOD W/SENSITIVITY    Collection Time: 03/13/17  8:58 PM   Result Value Ref Range    Battery Name BLOOD CULTURE     Specimen Description BLOOD  LEFT  UPPER   ARM       Special Requests NONE     Culture NO GROWTH 1 DAY     Report Status     POC GLUCOSE    Collection Time: 03/13/17  9:25 PM   Result Value Ref Range    Glucose, POC 190 (H) 70 - 100 MG/DL   TROPONIN-I    Collection Time: 03/14/17  2:02 AM   Result Value Ref Range    Troponin-I 0.01 0.0 - 0.05 NG/ML   COMPREHENSIVE METABOLIC PANEL    Collection Time: 03/14/17  2:02 AM   Result Value Ref Range    Sodium 136 (L) 137 - 147 MMOL/L    Potassium 4.4 3.5 - 5.1 MMOL/L    Chloride 105 98 - 110 MMOL/L    Glucose 226 (H) 70 - 100 MG/DL    Blood Urea Nitrogen 27 (H) 7 - 25 MG/DL    Creatinine 1.61 (H) 0.4 - 1.00 MG/DL    Calcium 9.3 8.5 - 09.6 MG/DL    Total Protein 7.0 6.0 - 8.0 G/DL    Total Bilirubin 0.5 0.3 - 1.2 MG/DL    Albumin 3.2 (L) 3.5 - 5.0 G/DL    Alk Phosphatase 75 25 - 110 U/L    AST (SGOT) 17 7 - 40 U/L  CO2 25 21 - 30 MMOL/L    ALT (SGPT) 5 (L) 7 - 56 U/L    Anion Gap 6 3 - 12    eGFR Non African American 36 (L) >60 mL/min    eGFR African American 43 (L) >60 mL/min   CBC AND DIFF    Collection Time: 03/14/17  2:02 AM   Result Value Ref Range    White Blood Cells 5.6 4.5 - 11.0 K/UL    RBC 2.54 (L) 4.0 - 5.0 M/UL    Hemoglobin 7.9 (L) 12.0 - 15.0 GM/DL    Hematocrit 16.1 (L) 36 - 45 %    MCV 96.9 80 - 100 FL    MCH 30.8 26 - 34 PG    MCHC 31.8 (L) 32.0 - 36.0 G/DL    RDW 09.6 (H) 11 - 15 %    Platelet Count 158 150 - 400 K/UL    MPV 8.0 7 - 11 FL    Neutrophils 71 41 - 77 %    Lymphocytes 13 (L) 24 - 44 %    Monocytes 10 4 - 12 %    Eosinophils 6 (H) 0 - 5 %    Basophils 0 0 - 2 %    Absolute Neutrophil Count 4.00 1.8 - 7.0 K/UL Absolute Lymph Count 0.70 (L) 1.0 - 4.8 K/UL    Absolute Monocyte Count 0.50 0 - 0.80 K/UL    Absolute Eosinophil Count 0.40 0 - 0.45 K/UL    Absolute Basophil Count 0.00 0 - 0.20 K/UL   POC GLUCOSE    Collection Time: 03/14/17  7:57 AM   Result Value Ref Range    Glucose, POC 210 (H) 70 - 100 MG/DL       Imaging  Reviewed    A/P  61 y.o. yo female p/w S OB and atrial fibrillation PMH significant for CAD, STEMI, prior CABG 9/28 with subsequent development of atrial fibrillation/flutter not currently on anticoagulation, DM, hypertension.  GI is consulted for evaluation of anticoagulation risk and presumed chronic GI bleeding.    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 If still here over the weekend, we will plan for EGD with APC on Monday for GAVE.  --Okay with heparin for the interim. Should she require endoscopy sooner due to [facility], we can perform it over the weekend as a call case  ???Her anticoagulation risk is slightly elevated with a known history of GAVE.  However the risk and benefit of anticoagulation in the setting of atrial fibrillation will need to be determined by the cardiology team.  From a GI perspective the patient is at increased risk of recurrent bleeding due to GAVE and would benefit from endoscopic therapy prior to anticoagulation    Patient seen and examined with Dr. Camillo Flaming who agrees with the above     A. Joanne Gavel, DO  Pager [phone]  GI Fellow  03/14/2017 11:07 AM

## 2017-03-14 NOTE — Consults
This note is only to fill a consult order.  Please see my previous note for full details.

## 2017-03-14 NOTE — Consults
CARDIAC ELECTROPHYSIOLOGY CONSULT NOTE     I called and discussed her care with Dr. Lawana Pai.  Patient was not seen.  She is a medically complicated woman who has had acute GI bleeding with a hemoglobin of 7.9.  There were questions about plans for rhythm versus rate control for her.  She had a left atrial appendage surgical ligation at the time of her recent heart surgery.  It is uncertain whether this is completely closed off (has not had a follow-up TEE).    Given uncertain ability to tolerate anticoagulation ongoing, I would not recommend rhythm control.  She would not be a candidate for ablation.  Lenient rate control can be continued given her acute illness and GI bleeding.  Afterwards, she can transition to a more stable chronic regimen.  If she is able to tolerate chronic anticoagulation, rhythm control can be revisited.    Please let us know if we can be of further assistance.   Devin Going, MD, [facility], [place], [place]  03/14/2017  11:54 AM

## 2017-03-14 NOTE — Progress Notes
Labs obtained and labeled at bedside.

## 2017-03-14 NOTE — Case Management (ED)
Case Management Admission Assessment    NAME:Diane Savage                          MRN: [id number]             DOB:Jul 24, 1955          AGE: 61 y.o.  ADMISSION DATE: 03/13/2017             DAYS ADMITTED: LOS: 1 day      Today???s Date: 03/14/2017    Source of Information: Pt's husband Annette Stable (pt off unit for chest tube placement)       Plan  Plan: CM Assessment, Assist PRN with SW/NCM Services, Discharge Planning for Home Anticipated     Plan for left chest tube placement. CTS and GI consulted. Pt anticipated to d/c to home when medically stable.    Per chart, pt is a 61 y.o. female, the patient has a past medical history of Acquired hypothyroidism; Arthritis; Back pain; Bleeding disorder (HCC); Coronary artery disease; Diabetes mellitus (HCC); Hypertension; Stomach disorder; and Vision decreased. presenting with shortness of breath, dizziness/lightheadedness, chills, nausea, lower extremity swelling and palpitations.    Patient Address/Phone  [address]  [place] [address]  [phone] (home)     Emergency Contact  Extended Emergency Contact Information  Primary Emergency Contact: Brenden, Gavia States  Home Phone: [phone]  Mobile Phone: [phone]  Relation: Spouse  Secondary Emergency Contact: Jonelle Sidle States  Home Phone: [phone]  Mobile Phone: [phone]  Relation: Daughter    Healthcare Directive  Healthcare Directive: No, patient does not have a healthcare directive  Would patient like to fill out a (a new) Healthcare Directive?: No, patient declined  Psych Advance Directive (Psych unit only): No, patient does not have a Psych Advance Directive     Blank copy of DPOA in pt's room to review, SW provided this SW's contact information to contact if pt wishes to complete.       Transportation  Does the patient need discharge transport arranged?: No  Transportation Name, Phone and Availability #1: Annette Stable (husband) [phone]  Does the patient use Medicaid Transportation?: No Pt's husband states he drives her to appointments    Expected Discharge Date  Expected Discharge Date: 03/17/17    Living Situation Prior to Admission  ? Living Arrangements  Type of Residence: Home, independent  Living Arrangements: Spouse/significant other  How many levels in the residence?: 1  Can patient live on one level if needed?: Yes  Does residence have entry and/or side stairs?: No (ramp)  Assistance needed prior to admit or anticipated on discharge: No  Who provides assistance or could if needed?: possibly pt's husband Annette Stable or dtr Noreene Larsson  Are they in good health?: Unknown  Can support system provide 24/7 care if needed?: Maybe  ? Level of Function   Prior level of function: Independent  ? Cognitive Abilities   Cognitive Abilities: Continue to Assess (SW spoke to pt's husband - pt off unit)     Pt's husband denies concerns with d/c to home at this time.    Financial Resources  ? Coverage  Primary Insurance: No insurance - Currently applying for Medicaid   Secondary Insurance: No insurance  Additional Coverage: None (Pt's husband reports pt gets assistance with medications at a clinic in [place] - unable to recall name. Pt's husband confirmed pt has been able to get all of her medications.)  SW discussed with team in huddle, currently only anticipate PO amoxicillin to be added at d/c.     Per chart, pt received medication voucher at d/c on 02/15/17.     ? Source of Income   Source Of Income: SSDI ($1600/month)  ? Financial Assistance Needed?  Yes, pt currently applying for Medicaid. SW contacted [facility] and received update from [employer] who states they obtained pt's signatures on Medicaid application yesterday and are now awaiting bank statements prior to submitting application. SW discussed with pt's husband who is aware of need to mail in bank statements.    Psychosocial Needs  ? Mental Health  Mental Health History: No (Pt's husband denies)  ? Substance Use History Substance Use History Screen: No (Pt's husband denies)  ? Other  N/A    Current/Previous Services  ? PCP  Sherre Lain, [phone], [phone]    Dr. Rulon Abide (Cardiology) [phone]    ? Pharmacy    [facility] - [place], [place] - [address]  [address] [place] [address]  Phone: [phone] Fax: [phone]    [facility]  [phone] [address]  [place] [address]  Phone: [phone] Fax: [phone]    ? Durable Medical Equipment   Durable Medical Equipment at home: None  ? Home Health  Receiving home health: No  ? Hemodialysis or Peritoneal Dialysis  Undergoing hemodialysis or peritoneal dialysis: No  ? Tube/Enteral Feeds  Receive tube/enteral feeds: No  ? Infusion  Receive infusions: No  ? Private Duty  Private duty help used: No  ? Home and Community Based Services  Home and community based services: No  ? [employer]: N/A  ? Hospice  Hospice: No  ? Outpatient Therapy  PT: No  OT: No  SLP: No  ? Skilled Nursing Facility/Nursing Home  SNF: No  NH: No  ? Inpatient Rehab  IPR: No  ? Long-Term Acute Care Hospital  LTACH: No  ? Acute Hospital Stay  Acute Hospital Stay: Yes  Was patient's stay within the last 30 days?: Yes  When did patient receive care?: 02/15/17  Name of hospital: [facility]  Readmission Code Group: 5. Medical Plan of Care - Treatment or Possible Complication  5. Medical Plan of Care - Treatment or Possible Complication: 5d. Possible complication of care  Related or Unrelated?: Related    Redgie Grayer, LMSW  p. [phone]

## 2017-03-14 NOTE — Progress Notes
CTS progress note    Diane Savage is s/p CABG/MAZE/ ligation LAA on Dr. Alfonso Ramus on 02/04/17 admitted yesterday with atrial fibrillation and left pleural effusion. GI consulted for anticoagulation recommendations as she has h/o GI bleeds.    VS: afebrile, A fib rate 120s, RA, BP 110-140s    Labs:  Hgb 8.5-->7.9  Cr 1.6-->1.49    Chest xray-large left pleural effusion    Plan:  1. Agree with left pigtail drain placement in IR today  2. Continue with rate control for atrial fib, GI consult pending re: anticoagulation recs.

## 2017-03-14 NOTE — Progress Notes
BCX2 one set drawn from [facility] using SONO and second set drawn from L upper arm using SONO by J.Buckley RN and labeled at the bedside.

## 2017-03-14 NOTE — Progress Notes
Sedation physician present in room. Recent vitals and patient condition reviewed between sedating physician and nurse. Reassessment completed. Determination made to proceed with planned sedation.

## 2017-03-14 NOTE — Progress Notes
Report to [facility], Surveyor, mining. All questions answered.

## 2017-03-14 NOTE — Progress Notes
XRAY at bedside

## 2017-03-14 NOTE — Consults
CTS CONSULT  Date of Service: 03/13/2017    Requesting Physician:    Consulting Physician:    Consult Performed By:        HPI:    Today we had the pleasure of seeing your patient, Diane Savage, in our office for routine postop follow up after coronary artery bypass times 4 with endoscopic vein harvesting with left internal mammary artery anastomosed to the LAD, reversed saphenous vein graft anastomosed in sequence to the obtuse marginal and left posterolateral arteries, and reversed saphenous vein graft anastomosed to the posterior descending branch of the right coronary artery, bilateral modified CryoMaze procedure (pulmonary vein isolation) and suture ligation of left atrial appendage performed by Dr. Brock Bad on 02/04/17. She had a complex hospital course and was seen preoperatively by GI and hematology for her history of gave as well as recurrent GI bleeding and multiple transfusions.  She did develop junctional rhythm postoperatively as well as atrial tachycardia and atrial fibrillation.  EP recommended outpatient follow-up.  She did develop a left pleural effusion which required a pigtail drain.  She was eventually discharged home.  ???  Since discharge Diane Savage states she has been doing well. She has been walking daily minimally.  She has followed up with her cardiologist in [facility] Dr. Ruffin Frederick who recently saw her and performed an EKG which she states revealed atrial tachycardia with a heart rate of 120.  He recommended continue follow-up in December of this year.  She continues to have fatigue, shortness of breath, lightheadedness and dizziness.  She has had transfusions since discharge which she had preoperatively as well.  She had some left lower extremity edema and she states she had an ultrasound for this which they state were negative.  ???  EKG performed in the office yesterday revealed atrial flutter with a heart rate of 124.  It was recommended that she be admitted to the hospital for rate control, but she declined admission.  However, this morning she awoke feeling worse and presented to the ED in afib.  She was admitted to the cardiology service.  A CT head was obtained due to recent concern for word-finding difficulties and slurring of speech, which was negative.  A CXR was obtained showing a left-sided pleural effusion.  Cardiothoracic surgery is consulted to assist with management.      Past Medical History:   Diagnosis Date   ??? Acquired hypothyroidism    ??? Arthritis    ??? Back pain    ??? Bleeding disorder (HCC)    ??? Coronary artery disease    ??? Diabetes mellitus (HCC)     Type II   ??? Hypertension    ??? Stomach disorder    ??? Vision decreased        PSH:  Past Surgical History:   Procedure Laterality Date   ??? HX HEART CATHETERIZATION  2017   ??? CORONARY STENT PLACEMENT  2017   ??? CORONARY ARTERY BYPASS GRAFT N/A 02/04/2017    CORONARY ARTERY BYPASS WITH ARTERIAL GRAFT - 4 GRAFTS (Internal Mammary Artery and Endovascular Vein Harvest) performed by [facility]ter, [facility] at [facility]   ??? HX MAZE N/A 02/04/2017    MAZE PROCEDURE performed by [facility] Schlichter, MD at [facility]   ??? ANKLE SURGERY      Left ankle reconstruction   ??? COLONOSCOPY     ??? HX KNEE ARTHROSCOPY Left    ??? HX TONSIL AND ADENOIDECTOMY     ??? TUBAL  LIGATION     ??? UPPER GASTROINTESTINAL ENDOSCOPY          Medications:    aspirin chewable tablet 81 mg 81 mg Oral QDAY   cetirizine (ZYRTEC) tablet 10 mg 10 mg Oral QAM8   folic acid (FOLVITE) tablet 1 mg 1 mg Oral QDAY   furosemide (LASIX) injection 40 mg 40 mg Intravenous ONCE   [START ON 03/14/2017] furosemide (LASIX) tablet 20 mg 20 mg Oral QDAY   insulin aspart U-100 (NOVOLOG FLEXPEN) injection PEN 4 Units 4 Units Subcutaneous TID w/ meals   [START ON 03/14/2017] insulin NPH (HUMULIN N KwikPen) injection PEN 14 Units 14 Units Subcutaneous QDAY(07)   [START ON 03/14/2017] levothyroxine (SYNTHROID) tablet 175 mcg 175 mcg Oral QDAY 30 min before breakfast   metoprolol tartrate (LOPRESSOR) tablet 100 mg 100 mg Oral BID   pantoprazole DR (PROTONIX) tablet 80 mg 80 mg Oral QDAY(21)   rosuvastatin (CRESTOR) tablet 40 mg 40 mg Oral QDAY   senna/docusate (SENOKOT-S) tablet 2 tablet 2 tablet Oral BID   sucralfate (CARAFATE) tablet 1 g 1 g Oral QID       Allergies:  Allergies   Allergen Reactions   ??? Diltiazem RASH, NAUSEA AND VOMITING and SEE COMMENTS     Sore mouth   ??? Sulfa (Sulfonamide Antibiotics) RASH   ??? Duricef [Cefadroxil] NAUSEA AND VOMITING   ??? Pravastatin NAUSEA AND VOMITING   ??? Simvastatin NAUSEA AND VOMITING       Family History:  Family History   Problem Relation Age of Onset   ??? Diabetes Mother    ??? Heart Disease Mother    ??? High Cholesterol Mother    ??? Arthritis-rheumatoid Mother    ??? Stroke Mother    ??? Thyroid Disease Mother    ??? Coronary Artery Disease Mother    ??? Hyperlipidemia Mother    ??? Cancer Father    ??? Cancer-Breast Sister    ??? Cancer Sister    ??? Hypertension Sister    ??? Heart Disease Sister    ??? High Cholesterol Sister    ??? Arthritis-osteo Sister    ??? Migraines Sister    ??? Rashes/Skin Problems Sister    ??? Depression Sister    ??? Hyperlipidemia Sister    ??? Cancer Brother    ??? Hypertension Brother    ??? Arthritis-rheumatoid Brother    ??? Rashes/Skin Problems Brother    ??? Coronary Artery Disease Brother    ??? Heart Disease Maternal Aunt    ??? Coronary Artery Disease Paternal Uncle    ??? Hyperlipidemia Paternal Uncle    ??? Cancer Maternal Grandmother    ??? Diabetes Maternal Grandmother    ??? Heart Disease Maternal Grandmother    ??? Cancer-Colon Paternal Grandmother    ??? Cancer Paternal Grandmother    ??? Diabetes Paternal Grandmother    ??? Cancer Paternal Grandfather          Social History:  Social History     Social History   ??? Marital status: Married     Spouse name: N/A   ??? Number of children: N/A   ??? Years of education: N/A     Social History Main Topics   ??? Smoking status: Former Smoker     Packs/day: 2.00     Years: 10.00 Types: Cigarettes     Quit date: 09/16/1980   ??? Smokeless tobacco: Never Used   ??? Alcohol use Yes      Comment: Seldom   ???  Drug use: No   ??? Sexual activity: Not on file     Other Topics Concern   ??? Not on file     Social History Narrative   ??? No narrative on file       ROS:  Constitutional: Negative for Fatigue, Weight Change, Fevers  Eyes, Ears, Nose And Throat: Negative for Change in vision, Change in Hearing   Cardiovascular: Negative for Chest Pain; positive for palpitations and swelling in left ankle  Respiratory: Negative for Cough, Shortness of Breath, wheezing  Gastrointestinal: Negative for Nausea, indigestion, Diarrhea, Constipation, Rectal Bleeding   Neurological: Negative for Headache, Memory problems, Numbness, Muscle Weakness  Psychological: Negative for depression or anxiety  Musculoskeletal: Negative for Pain or Swelling in joints  Genitourinary: Negative for Pain with urination, Incontinence of urine, urinary frequency    Skin: Negative for any unusual rash  Endocrine: Negative for Any hair skin or nail changes, Unusual hunger or thirst    Physical Exam:  Temp: 36.4 ???C (97.6 ???F) (10/11 1838)  Pulse: 122 (10/11 2100)  Respirations: 22 PER MINUTE (10/11 1838)  BP: 140/67 (10/11 2100)      GENERAL:   A&O x 3, NAD.  HEENT  Head:  Normocephalic   Teeth: Present and in good dentition  NECK  Active ROM: full  Trachea: midline  HEART  Cardiac: Atrial fibrillation in ~110s  Well-healed sternotomy incision present  LUNGS  Auscultation- breath sounds clear but diminished on L  ABDOMEN  Soft, NT + BS  EXTREMITIES  Edema- 3+ edema LLE, 2+ edema RLE  Well-healed vein harvest incisions present  Posterior Tibial- 2+ bil   Dorsalis Pedis- 2+ bil  SKIN:   Normal, without lesions  NEUROLOGIC  A&O x 3  Grossly intact     Results for orders placed or performed during the hospital encounter of 03/13/17 (from the past 24 hour(s))   CBC AND DIFF    Collection Time: 03/13/17  4:07 PM   # # Low-High White Blood Cells 4.8 4.5 - 11.0 K/UL    RBC 2.81 (L) 4.0 - 5.0 M/UL    Hemoglobin 8.5 (L) 12.0 - 15.0 GM/DL    Hematocrit 16.1 (L) 36 - 45 %    MCV 96.3 80 - 100 FL    MCH 30.2 26 - 34 PG    MCHC 31.3 (L) 32.0 - 36.0 G/DL    RDW 09.6 (H) 11 - 15 %    Platelet Count 157 150 - 400 K/UL    MPV 8.1 7 - 11 FL    Neutrophils 68 41 - 77 %    Lymphocytes 17 (L) 24 - 44 %    Monocytes 9 4 - 12 %    Eosinophils 6 (H) 0 - 5 %    Basophils 0 0 - 2 %    Absolute Neutrophil Count 3.20 1.8 - 7.0 K/UL    Absolute Lymph Count 0.80 (L) 1.0 - 4.8 K/UL    Absolute Monocyte Count 0.50 0 - 0.80 K/UL    Absolute Eosinophil Count 0.30 0 - 0.45 K/UL    Absolute Basophil Count 0.00 0 - 0.20 K/UL   COMPREHENSIVE METABOLIC PANEL    Collection Time: 03/13/17  4:07 PM   # # Low-High    Sodium 134 (L) 137 - 147 MMOL/L    Potassium 4.4 3.5 - 5.1 MMOL/L    Chloride 104 98 - 110 MMOL/L    Glucose 163 (H) 70 - 100 MG/DL  Blood Urea Nitrogen 27 (H) 7 - 25 MG/DL    Creatinine 6.21 (H) 0.4 - 1.00 MG/DL    Calcium 9.5 8.5 - 30.8 MG/DL    Total Protein 7.6 6.0 - 8.0 G/DL    Total Bilirubin 0.6 0.3 - 1.2 MG/DL    Albumin 3.5 3.5 - 5.0 G/DL    Alk Phosphatase 73 25 - 110 U/L    AST (SGOT) 16 7 - 40 U/L    CO2 25 21 - 30 MMOL/L    ALT (SGPT) 7 7 - 56 U/L    Anion Gap 5 3 - 12    eGFR Non African American 32 (L) >60 mL/min    eGFR African American 39 (L) >60 mL/min   MAGNESIUM    Collection Time: 03/13/17  4:07 PM   # # Low-High    Magnesium 1.8 1.6 - 2.6 mg/dL   PHOSPHORUS    Collection Time: 03/13/17  4:07 PM   # # Low-High    Phosphorus 3.5 2.0 - 4.5 MG/DL   POC TROPONIN    Collection Time: 03/13/17  4:09 PM   # # Low-High    Troponin-I-POC 0.00 0.00 - 0.05 NG/ML   BNP POC ER    Collection Time: 03/13/17  4:16 PM   # # Low-High    BNP POC 327.0 (H) 0 - 100 PG/ML   LACTIC ACID (BG - RAPID LACTATE)    Collection Time: 03/13/17  8:53 PM   # # Low-High    Lactic Acid,BG 1.3 0.5 - 2.0 MMOL/L   POC GLUCOSE    Collection Time: 03/13/17  9:25 PM # # Low-High    Glucose, POC 190 (H) 70 - 100 MG/DL        All pertinent diagnostic studies have been reviewed.      Impression:  Active Hospital Problems    Diagnosis   ??? Atrial fibrillation (HCC)        Plan:    Review of Diane Savage CXR shows a large left-sided pleural effusion.   Would recommend IR-guided drainage of her effusion.  No major surgical issues appreciated; agree with GI input for long-term anticoagulation strategies given her history of afib/flutter and her previous intolerance of anticoagulation.  We will continue to follow along with you.      Discussed with Dr. Farris Has.      Oneal Grout MD

## 2017-03-14 NOTE — Other
Immediate Post Procedure Note    Date:  03/14/2017                                         Attending Physician:   Derrek Gu, MD  Performing Provider:  Synthia Innocent, MD    Consent:  Consent obtained from patient.  Time out performed: Consent obtained, correct patient verified, correct procedure verified, correct site verified, patient marked as necessary.  Pre/Post Procedure Diagnosis:  left pleural effusion  Indications:  left pleural effusion    Anesthesia: Local 10 mL 1% lidocaine without epinephrine with IV sedation 4mg  versed and 100 mcg fentanyl  Procedure(s):  US guided left chest tube placement   Findings:  clear yellow pleural fluid     Estimated Blood Loss:  None/Negligible  Specimen(s) Removed/Disposition:  fluid sent to lab  Complications: None  Patient Tolerated Procedure: Well  Post-Procedure Condition:  stable    Synthia Innocent, MD

## 2017-03-14 NOTE — H&P (View-Only)
Pre-Procedure History and Physical/Sedation Plan    Procedure Date: 03/14/2017     Planned Procedure(s): Left chest tube placement     Indication: L pleural effusion s/p CABG    __________________________________________________________________    Chief Complaint:  L pleural effusion     History of Present Illness: Diane Savage is a 61 y.o. female with a history significant for CABG and pleural effusion who presents today for procedure.    Patient Active Problem List    Diagnosis Date Noted   ??? Atrial fibrillation (HCC) 03/13/2017   ??? Hypertension 02/11/2017   ??? AKI (acute kidney injury) (HCC) 02/06/2017   ??? CAD (coronary artery disease) 02/04/2017   ??? HLD (hyperlipidemia) 02/04/2017   ??? Hypothyroidism 02/04/2017   ??? 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 Acute blood loss anemia 02/04/2017   ??? Thrombocytopenia (HCC) 02/04/2017   ??? 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 Type 2 diabetes mellitus with circulatory disorder, without long-term current use of insulin (HCC) 01/31/2017   ??? PAF (paroxysmal atrial fibrillation) (HCC) 01/31/2017   ??? Atrial fibrillation with rapid ventricular response (HCC) 01/31/2017   ??? Chronic gastrointestinal bleeding 01/31/2017   ??? 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 Absolute anemia 11/01/2016   ??? Anemia 09/16/2016     Past Medical History:   Diagnosis Date   ??? Acquired hypothyroidism    ??? Arthritis    ??? Back pain    ??? Bleeding disorder (HCC)    ??? Coronary artery disease    ??? Diabetes mellitus (HCC)     Type II   ??? Hypertension    ??? Stomach disorder    ??? Vision decreased       Past Surgical History:   Procedure Laterality Date   ??? HX HEART CATHETERIZATION  2017   ??? CORONARY STENT PLACEMENT  2017   ??? CORONARY ARTERY BYPASS GRAFT N/A 02/04/2017    CORONARY ARTERY BYPASS WITH ARTERIAL GRAFT - 4 GRAFTS (Internal Mammary Artery and Endovascular Vein Harvest) performed by [facility]ene Schlichter, MD at [facility]   ??? HX MAZE N/A 02/04/2017 MAZE PROCEDURE performed by [facility]ene Schlichter, MD at [facility]   ??? ANKLE SURGERY      Left ankle reconstruction   ??? COLONOSCOPY     ??? HX KNEE ARTHROSCOPY Left    ??? HX TONSIL AND ADENOIDECTOMY     ??? TUBAL LIGATION     ??? UPPER GASTROINTESTINAL ENDOSCOPY        Prescriptions Prior to Admission   Medication Sig Dispense Refill Last Dose   ??? acetaminophen (TYLENOL) 325 mg tablet Take two tablets by mouth every 6 hours as needed.  0 03/13/2017   ??? aspirin 81 mg chewable tablet Chew one tablet by mouth daily with food. 90 tablet 3 03/13/2017   ??? cetirizine (ZYRTEC) 10 mg tablet Take 10 mg by mouth every morning.   03/13/2017   ??? clobetasol (TEMOVATE) 0.05 % topical cream Apply to affected area twice daily as needed   Past Week   ??? diphenhydrAMINE (BENADRYL ALLERGY) 25 mg tablet Take 25 mg by mouth at bedtime as needed.   03/12/2017   ??? folic acid (FOLVITE) 1 mg tablet Take 1 mg by mouth daily.   03/13/2017   ??? furosemide (LASIX) 20 mg tablet Take 20 mg by mouth at bedtime daily.   03/12/2017   ??? GLUCOSAMINE HCL/CHONDROITIN SU (GLUCOSAMINE-CHONDROITIN PO) Take 1 tablet by mouth twice daily.  03/13/2017   ??? insulin aspart U-100 (NOVOLOG) 100 unit/mL injection Inject four Units under the skin three times daily with meals. 10 mL 30 03/12/2017   ??? insulin NPH (HUMULIN N NPH U-100 INSULIN) 100 unit/mL injection Inject fourteen Units under the skin every morning. 10 mL 30 03/12/2017   ??? Insulin Syringe-Needle U-100 (BD INSULIN SYRINGE ULTRA-FINE) 0.3 mL 31 gauge x 5/16 syrg Use four times daily with Insulin 100 each 0 Taking   ??? levothyroxine (SYNTHROID) 175 mcg tablet Take 175 mcg by mouth daily 30 minutes before breakfast.   03/13/2017   ??? metoprolol tartrate (LOPRESSOR) 100 mg tablet Take 100 mg by mouth twice daily.   03/13/2017   ??? metoprolol tartrate (LOPRESSOR) 50 mg tablet Take one tablet by mouth twice daily. 30 tablet 1 med correction   ??? nitroglycerin (NITROSTAT) 0.4 mg tablet Place 0.4 mg under tongue every 5 minutes as needed for Chest Pain. Max of 3 tablets, call 911.   >1 Month   ??? omeprazole DR(+) (PRILOSEC) 40 mg capsule Take 40 mg by mouth twice daily.   03/13/2017   ??? ondansetron (ZOFRAN) 8 mg tablet Take 8 mg by mouth every 8 hours as needed for Nausea or Vomiting.   Past Week   ??? rosuvastatin (CRESTOR) 40 mg tablet Take one tablet by mouth daily. 90 tablet 3 med not active   ??? senna/docusate (SENOKOT-S) 8.6/50 mg tablet Take two tablets by mouth twice daily. (Patient taking differently: Take 2 tablets by mouth at bedtime daily.) 20 tablet 0 03/12/2017   ??? sucralfate (CARAFATE) 1 gram tablet Take 1 g by mouth four times daily. Take on an empty stomach.   med not active   ??? tiZANidine (ZANAFLEX) 4 mg tablet Take 4 mg by mouth every 6 hours as needed.   03/12/2017   ??? traMADol (ULTRAM) 50 mg tablet Take one tablet by mouth every 6 hours as needed for Pain. 30 tablet 0 03/13/2017     Allergies   Allergen Reactions   ??? Diltiazem RASH, NAUSEA AND VOMITING and SEE COMMENTS     Sore mouth   ??? Sulfa (Sulfonamide Antibiotics) RASH   ??? Duricef [Cefadroxil] NAUSEA AND VOMITING   ??? Pravastatin NAUSEA AND VOMITING   ??? Simvastatin NAUSEA AND VOMITING       Social History:   Social History   Substance Use Topics   ??? Smoking status: Former Smoker     Packs/day: 2.00     Years: 10.00     Types: Cigarettes     Quit date: 09/16/1980   ??? Smokeless tobacco: Never Used   ??? Alcohol use Yes      Comment: Seldom      Family History   Problem Relation Age of Onset   ??? Diabetes Mother    ??? Heart Disease Mother    ??? High Cholesterol Mother    ??? Arthritis-rheumatoid Mother    ??? Stroke Mother    ??? Thyroid Disease Mother    ??? Coronary Artery Disease Mother    ??? Hyperlipidemia Mother    ??? Cancer Father    ??? Cancer-Breast Sister    ??? Cancer Sister    ??? Hypertension Sister    ??? Heart Disease Sister    ??? High Cholesterol Sister    ??? Arthritis-osteo Sister    ??? Migraines Sister    ??? Rashes/Skin Problems Sister    ??? Depression Sister ??? Hyperlipidemia Sister    ??? Cancer Brother    ???  Hypertension Brother    ??? Arthritis-rheumatoid Brother    ??? Rashes/Skin Problems Brother    ??? Coronary Artery Disease Brother    ??? Heart Disease Maternal Aunt    ??? Coronary Artery Disease Paternal Uncle    ??? Hyperlipidemia Paternal Uncle    ??? Cancer Maternal Grandmother    ??? Diabetes Maternal Grandmother    ??? Heart Disease Maternal Grandmother    ??? Cancer-Colon Paternal Grandmother    ??? Cancer Paternal Grandmother    ??? Diabetes Paternal Grandmother    ??? Cancer Paternal Grandfather         Review of Systems  A comprehensive review of systems was negative.    Previous Personal Anesthetic/Sedation History:  Denies adverse events related to sedation/anesthesia.     Previous Family Anesthetic/Sedation History: Denies adverse events related to sedation/anesthesia.    Physical Exam:  Vital Signs: Last Filed In 24 Hours Vital Signs: 24 Hour Range   BP: 120/63 (10/12 1033)  Temp: 36.8 ???C (98.2 ???F) (10/12 0709)  Pulse: 109 (10/12 1031)  Respirations: 18 PER MINUTE (10/12 1031)  SpO2: 93 % (10/12 1031)  O2 Delivery: None (Room Air) (10/12 1031)  SpO2 Pulse: 108 (10/11 1713)  Height: 162.6 cm (64) (10/11 1838) BP: (110-141)/(51-94)   Temp:  [36.4 ???C (97.6 ???F)-36.9 ???C (98.5 ???F)]   Pulse:  [80-125]   Respirations:  [18 PER MINUTE-22 PER MINUTE]   SpO2:  [85 %-100 %]   O2 Delivery: None (Room Air)   Intensity Pain Scale (Self Report): 6 (03/14/17 0981)      General appearance: alert and no distress noted.  Neurologic: Grossly normal.  Lungs: Non labored.  Heart: regular rate and rhythm      Airway: airway assessment performed  Mallampati II (soft palate, uvula, fauces visible)  Head and Neck: no abnormalities noted  Mouth: no abnormalities noted  NPO status: Acceptable  Pregnancy Status: Not Pregnant  Anesthesia Classification:  ASA II (A normal patient with mild systemic disease)  Sedation/Medication Plan: Fentanyl and Midazolam Discussion/Reviews:  Physician has discussed risks and alternatives of this type of sedation and above planned procedures with patient    Lab/Radiology/Other Diagnostic Tests:  Labs:  Pertinent labs reviewed           Suzan Slick, APRN  Pager [phone]

## 2017-03-14 NOTE — Consults
Patient admitted to cardiology service - see H&P.

## 2017-03-15 LAB — POC GLUCOSE
Lab: 191 mg/dL — ABNORMAL HIGH (ref 70–100)
Lab: 201 mg/dL — ABNORMAL HIGH (ref 70–100)
Lab: 213 mg/dL — ABNORMAL HIGH (ref 70–100)
Lab: 233 mg/dL — ABNORMAL HIGH (ref 70–100)

## 2017-03-15 LAB — URINALYSIS, MICROSCOPIC

## 2017-03-15 LAB — COMPREHENSIVE METABOLIC PANEL: Lab: 134 MMOL/L — ABNORMAL LOW (ref 137–147)

## 2017-03-15 LAB — URINALYSIS DIPSTICK
Lab: NEGATIVE K/UL (ref 3–12)
Lab: NEGATIVE MMOL/L — ABNORMAL HIGH (ref 21–30)
Lab: NEGATIVE U/L (ref 7–40)
Lab: NEGATIVE U/L — ABNORMAL LOW (ref 25–110)
Lab: NEGATIVE mL/min — ABNORMAL LOW (ref 0–0.80)
Lab: NEGATIVE mL/min — ABNORMAL LOW (ref 1.0–4.8)

## 2017-03-15 LAB — CBC AND DIFF: Lab: 5.4 K/UL — ABNORMAL LOW (ref 4.5–11.0)

## 2017-03-15 NOTE — Progress Notes
Labs drawn by IVT with ultrasound.  Labeled at bedside.  Given to RN.

## 2017-03-16 LAB — POC GLUCOSE
Lab: 200 mg/dL — ABNORMAL HIGH (ref 70–100)
Lab: 219 mg/dL — ABNORMAL HIGH (ref 70–100)
Lab: 196 mg/dL — ABNORMAL HIGH (ref 70–100)
Lab: 231 mg/dL — ABNORMAL HIGH (ref 70–100)

## 2017-03-16 LAB — CBC AND DIFF: Lab: 5.5 K/UL — ABNORMAL HIGH (ref 4.5–11.0)

## 2017-03-16 LAB — COMPREHENSIVE METABOLIC PANEL: Lab: 134 MMOL/L — ABNORMAL LOW (ref >60)

## 2017-03-16 MED ORDER — LACTATED RINGERS IV SOLP
INTRAVENOUS | 0 refills | Status: AC
Start: 2017-03-16 — End: ?

## 2017-03-16 MED ORDER — INSULIN ASPART 100 UNIT/ML SC FLEXPEN
0-14 [IU] | Freq: Before meals | SUBCUTANEOUS | 0 refills | Status: DC
Start: 2017-03-16 — End: 2017-03-28
  Administered 2017-03-25: 15:00:00 2 [IU] via SUBCUTANEOUS

## 2017-03-16 MED ORDER — FUROSEMIDE 20 MG PO TAB
20 mg | Freq: Two times a day (BID) | ORAL | 0 refills | Status: DC
Start: 2017-03-16 — End: 2017-03-17
  Administered 2017-03-16 – 2017-03-17 (×2): 20 mg via ORAL

## 2017-03-16 MED ORDER — DILTIAZEM 125MG/125ML IV DRIP
5-15 mg/h | INTRAVENOUS | 0 refills | Status: DC
Start: 2017-03-16 — End: 2017-03-18
  Administered 2017-03-16 (×2): 10 mg/h via INTRAVENOUS
  Administered 2017-03-17 (×2): 5 mg/h via INTRAVENOUS
  Administered 2017-03-17 (×2): 10 mg/h via INTRAVENOUS
  Administered 2017-03-18 (×2): 5 mg/h via INTRAVENOUS

## 2017-03-16 NOTE — Progress Notes
CTS    CTS progress note    Diane Savage is s/p CABG/MAZE/ ligation LAA on Dr. Alfonso Ramus on 02/04/17 admitted 10/111/18 with atrial fibrillation and left pleural effusion. GI consulted for anticoagulation recommendations as she has h/o GI bleeds.    VS: afebrile, A fib/ST rate 120s, BP 110-120s, 92% on 3L    On cardizem gtt @ 10mg /hr.    Labs:  Hgb 8.5-->7.9-->8.8  Cr 1.6-->1.49-->2.05    Chest xray-large left pleural effusion-->L pigtail placed in IR 10/12 - 200cc/24hr.    Plan:  1. Cont L pigtail for now.  Will reassess output tomorrow.  If <100cc/24hr will dc tube.  2. Cont management per primary team.

## 2017-03-16 NOTE — Progress Notes
IVT for labs

## 2017-03-17 ENCOUNTER — Encounter: Admit: 2017-03-17 | Discharge: 2017-03-17 | Payer: MEDICARE

## 2017-03-17 ENCOUNTER — Inpatient Hospital Stay: Admit: 2017-03-17 | Discharge: 2017-03-17 | Payer: MEDICARE

## 2017-03-17 DIAGNOSIS — M1990 Unspecified osteoarthritis, unspecified site: ICD-10-CM

## 2017-03-17 DIAGNOSIS — I10 Essential (primary) hypertension: Secondary | ICD-10-CM

## 2017-03-17 DIAGNOSIS — E119 Type 2 diabetes mellitus without complications: ICD-10-CM

## 2017-03-17 DIAGNOSIS — I2510 Atherosclerotic heart disease of native coronary artery without angina pectoris: Principal | ICD-10-CM

## 2017-03-17 DIAGNOSIS — M549 Dorsalgia, unspecified: ICD-10-CM

## 2017-03-17 DIAGNOSIS — H547 Unspecified visual loss: ICD-10-CM

## 2017-03-17 DIAGNOSIS — K319 Disease of stomach and duodenum, unspecified: ICD-10-CM

## 2017-03-17 DIAGNOSIS — I4891 Unspecified atrial fibrillation: Secondary | ICD-10-CM

## 2017-03-17 DIAGNOSIS — D699 Hemorrhagic condition, unspecified: ICD-10-CM

## 2017-03-17 DIAGNOSIS — E039 Hypothyroidism, unspecified: ICD-10-CM

## 2017-03-17 LAB — POC GLUCOSE
Lab: 241 mg/dL — ABNORMAL HIGH (ref 70–100)
Lab: 215 mg/dL — ABNORMAL HIGH (ref 70–100)
Lab: 203 mg/dL — ABNORMAL HIGH (ref >40)
Lab: 214 mg/dL — ABNORMAL HIGH (ref 70–100)
Lab: 207 mg/dL — ABNORMAL HIGH (ref 70–100)

## 2017-03-17 LAB — CBC AND DIFF: Lab: 7.6 K/UL — ABNORMAL LOW (ref >60)

## 2017-03-17 LAB — SODIUM-URINE RANDOM: Lab: 12 MMOL/L

## 2017-03-17 LAB — UREA NITROGEN-URINE RANDOM: Lab: 247 mg/dL

## 2017-03-17 LAB — OSMOLALITY-URINE RANDOM: Lab: 342 mosm/kg (ref 50–1400)

## 2017-03-17 LAB — COMPREHENSIVE METABOLIC PANEL: Lab: 132 MMOL/L — ABNORMAL LOW (ref >60)

## 2017-03-17 LAB — CREATININE-URINE RANDOM: Lab: 194 mg/dL

## 2017-03-17 MED ORDER — PROCHLORPERAZINE EDISYLATE 5 MG/ML IJ SOLN
5 mg | Freq: Once | INTRAVENOUS | 0 refills | Status: CP
Start: 2017-03-17 — End: ?
  Administered 2017-03-17: 06:00:00 5 mg via INTRAVENOUS

## 2017-03-17 MED ORDER — LIDOCAINE (PF) 200 MG/10 ML (2 %) IJ SYRG
0 refills | Status: DC
Start: 2017-03-17 — End: 2017-03-17
  Administered 2017-03-17: 16:00:00 100 mg via INTRAVENOUS

## 2017-03-17 MED ORDER — SODIUM CHLORIDE 0.9 % IV SOLP
500 mL | INTRAVENOUS | 0 refills | Status: AC
Start: 2017-03-17 — End: ?
  Administered 2017-03-17: 16:00:00 1000.000 mL via INTRAVENOUS

## 2017-03-17 MED ORDER — PROPOFOL 10 MG/ML IV EMUL 20 ML (INFUSION)(AM)(OR)
INTRAVENOUS | 0 refills | Status: DC
Start: 2017-03-17 — End: 2017-03-17
  Administered 2017-03-17: 16:00:00 100 ug/kg/min via INTRAVENOUS

## 2017-03-17 MED ORDER — HYDROXYZINE HCL 25 MG PO TAB
25 mg | Freq: Three times a day (TID) | ORAL | 0 refills | Status: DC | PRN
Start: 2017-03-17 — End: 2017-03-28
  Administered 2017-03-17 – 2017-03-27 (×10): 25 mg via ORAL

## 2017-03-17 MED ORDER — FENTANYL CITRATE (PF) 50 MCG/ML IJ SOLN
0 refills | Status: DC
Start: 2017-03-17 — End: 2017-03-17
  Administered 2017-03-17: 16:00:00 25 ug via INTRAVENOUS

## 2017-03-17 MED ORDER — KETAMINE 10 MG/ML IJ SOLN
0 refills | Status: DC
Start: 2017-03-17 — End: 2017-03-17
  Administered 2017-03-17: 16:00:00 20 mg via INTRAVENOUS
  Administered 2017-03-17: 16:00:00 10 mg via INTRAVENOUS

## 2017-03-17 MED ORDER — DOXYCYCLINE HYCLATE 100 MG PO TAB
100 mg | Freq: Two times a day (BID) | ORAL | 0 refills | Status: AC
Start: 2017-03-17 — End: ?
  Administered 2017-03-17 – 2017-03-22 (×11): 100 mg via ORAL

## 2017-03-17 MED ORDER — PHENYLEPHRINE IN 0.9% NACL(PF) 1 MG/10 ML (100 MCG/ML) IV SYRG
0 refills | Status: DC
Start: 2017-03-17 — End: 2017-03-17
  Administered 2017-03-17 (×4): 100 ug via INTRAVENOUS

## 2017-03-17 MED ORDER — NPH INSULIN HUMAN RECOMB 100 UNIT/ML (3 ML) SC PEN
18 [IU] | Freq: Every day | SUBCUTANEOUS | 0 refills | Status: DC
Start: 2017-03-17 — End: 2017-03-28

## 2017-03-17 NOTE — H&P (View-Only)
Pre Procedure History and Physical/Sedation Plan    Name:Diane Savage                                                                   MRN: [id number]                 DOB:November 30, 1955          Age: 61 y.o.  Admission Date: 03/13/2017             Days Admitted: LOS: 4 days      Procedure Date:  03/17/2017    Planned Procedure(s):  GI:  EGD  Sedation/Medication Plan:  MAC (Monitored Anesthesia Care)  Discussion/Reviews:  Physician has discussed risks and alternatives of this type of sedation and above planned procedures with patient  ________________________________________________________________  Chief Complaint:  Inpatient endo consult note reviewed.  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    Previous Anesthetic/Sedation History:  Per anesthesia    Allergies:  Diltiazem; Sulfa (sulfonamide antibiotics); Duricef [cefadroxil]; Pravastatin; and Simvastatin  Medications:  Scheduled Meds:  [MAR Hold] aspirin chewable tablet 81 mg 81 mg Oral QDAY   [MAR Hold] cetirizine (ZYRTEC) tablet 10 mg 10 mg Oral QAM8   doxycycline (VIBRAMYCIN) tablet 100 mg 100 mg Oral BID   [MAR Hold] folic acid (FOLVITE) tablet 1 mg 1 mg Oral QDAY   [MAR Hold] insulin aspart U-100 (NOVOLOG FLEXPEN) injection PEN 0-14 Units 0-14 Units Subcutaneous ACHS   [MAR Hold] insulin NPH (HUMULIN N KwikPen) injection PEN 18 Units 18 Units Subcutaneous QDAY(07)   [MAR Hold] levothyroxine (SYNTHROID) tablet 175 mcg 175 mcg Oral QDAY 30 min before breakfast   [MAR Hold] metoprolol tartrate (LOPRESSOR) tablet 100 mg 100 mg Oral BID   nystatin (NYSTOP) topical powder  Topical BID   [MAR Hold] pantoprazole DR (PROTONIX) tablet 80 mg 80 mg Oral QDAY(21)   [MAR Hold] rosuvastatin (CRESTOR) tablet 40 mg 40 mg Oral QDAY   [MAR Hold] senna/docusate (SENOKOT-S) tablet 2 tablet 2 tablet Oral BID   [MAR Hold] sucralfate (CARAFATE) tablet 1 g 1 g Oral QID   Continuous Infusions:  ??? diltiazem (cardIZEM) 125 mg in sodium chloride 0.9% (NS) 125 mL IV drip (std conc) 5 mg/hr (03/17/17 0030)   ??? lactated ringers infusion     ??? sodium chloride 0.9 %   infusion       PRN and Respiratory Meds:[MAR Hold] acetaminophen Q6H PRN, fentaNYL citrate PF Intra-procedure Med, [MAR Hold] hydrOXYzine TID PRN, midazolam Intra-procedure Med, [MAR Hold] nitroglycerin Q5 MIN PRN, [MAR Hold] ondansetron Q8H PRN, [MAR Hold] traMADol Q6H PRN       Vital Signs:  Last Filed Vital Signs: 24 Hour Range   BP: 125/55 (10/15 0839)  Temp: 36.4 ???C (97.6 ???F) (10/15 0741)  Pulse: 79 (10/15 0839)  Respirations: 19 PER MINUTE (10/15 0741)  SpO2: 92 % (10/15 0741)  O2 Delivery: Nasal Cannula (10/15 0741) BP: (107-141)/(45-92)   Temp:  [36.4 ???C (97.6 ???F)-36.9 ???C (98.5 ???F)]   Pulse:  [59-120]   Respirations:  [16 PER MINUTE-20 PER MINUTE]   SpO2:  [82 %-94 %]   O2 Delivery: Nasal Cannula     NPO Status:     Airway:  mouth:   no  abnormalities noted  Anesthesia Classification:  ASA III (A patient with a severe systemic disease that limits activity, but is not incapacitating)  NPO Status: Acceptable  Preganancy Status: N/A    Lab/Radiology/Other Diagnostic Tests  Labs:    24-hour labs:    Results for orders placed or performed during the hospital encounter of 03/13/17 (from the past 24 hour(s))   POC GLUCOSE    Collection Time: 03/16/17  1:13 PM   Result Value Ref Range    Glucose, POC 196 (H) 70 - 100 MG/DL   POC GLUCOSE    Collection Time: 03/16/17  5:14 PM   Result Value Ref Range    Glucose, POC 231 (H) 70 - 100 MG/DL   POC GLUCOSE    Collection Time: 03/16/17  9:23 PM   Result Value Ref Range    Glucose, POC 241 (H) 70 - 100 MG/DL   CBC AND DIFF    Collection Time: 03/17/17  5:00 AM   Result Value Ref Range    White Blood Cells 7.6 4.5 - 11.0 K/UL    RBC 2.76 (L) 4.0 - 5.0 M/UL    Hemoglobin 8.7 (L) 12.0 - 15.0 GM/DL    Hematocrit 09.6 (L) 36 - 45 %    MCV 95.9 80 - 100 FL    MCH 31.5 26 - 34 PG    MCHC 32.8 32.0 - 36.0 G/DL    RDW 04.5 (H) 11 - 15 %    Platelet Count 194 150 - 400 K/UL MPV 9.1 7 - 11 FL    Neutrophils 72 41 - 77 %    Lymphocytes 13 (L) 24 - 44 %    Monocytes 10 4 - 12 %    Eosinophils 5 0 - 5 %    Basophils 0 0 - 2 %    Absolute Neutrophil Count 5.50 1.8 - 7.0 K/UL    Absolute Lymph Count 1.00 1.0 - 4.8 K/UL    Absolute Monocyte Count 0.80 0 - 0.80 K/UL    Absolute Eosinophil Count 0.30 0 - 0.45 K/UL    Absolute Basophil Count 0.00 0 - 0.20 K/UL   COMPREHENSIVE METABOLIC PANEL    Collection Time: 03/17/17  5:00 AM   Result Value Ref Range    Sodium 132 (L) 137 - 147 MMOL/L    Potassium 4.7 3.5 - 5.1 MMOL/L    Chloride 100 98 - 110 MMOL/L    Glucose 251 (H) 70 - 100 MG/DL    Blood Urea Nitrogen 34 (H) 7 - 25 MG/DL    Creatinine 4.09 (H) 0.4 - 1.00 MG/DL    Calcium 9.3 8.5 - 81.1 MG/DL    Total Protein 7.5 6.0 - 8.0 G/DL    Total Bilirubin 0.6 0.3 - 1.2 MG/DL    Albumin 3.2 (L) 3.5 - 5.0 G/DL    Alk Phosphatase 66 25 - 110 U/L    AST (SGOT) 27 7 - 40 U/L    CO2 25 21 - 30 MMOL/L    ALT (SGPT) 7 7 - 56 U/L    Anion Gap 7 3 - 12    eGFR Non African American 20 (L) >60 mL/min    eGFR African American 24 (L) >60 mL/min   POC GLUCOSE    Collection Time: 03/17/17  8:37 AM   Result Value Ref Range    Glucose, POC 215 (H) 70 - 100 MG/DL       Annie Sable, MD  Pager

## 2017-03-17 NOTE — Progress Notes
CTS    CTS progress note    Diane Savage is s/p CABG/MAZE/ ligation LAA on Dr. Alfonso Ramus on 02/04/17 admitted 10/111/18 with atrial fibrillation and left pleural effusion. GI consulted for anticoagulation recommendations as she has h/o GI bleeds.    VS: afebrile, A fib/ST rate 120s, BP 110-120s, 92% on 3L    On cardizem gtt @ 5mg /hr.    Labs:  Hgb 8.5-->7.9-->8.8-->8.7  Cr 1.6-->1.49-->2.05-->2.45 Lasix 20mg  PO BID (-1kg since admit)    Chest xray-large left pleural effusion-->L pigtail placed in IR 10/12 - 200cc/24hr.    Plan:  1. Cont L pigtail for now.  Will reassess output tomorrow.  If <100cc/24hr will dc tube.  2. Cont management per primary team.

## 2017-03-17 NOTE — Progress Notes
RN paged CTS multiple times throughout the day (per CV1 orders) regarding patient's chest tube.    1634:RN spoke with Liz Malady regarding no orders for patient's chest tube. Orders for chest tube placed. Will continue to monitor.

## 2017-03-17 NOTE — Anesthesia Post-Procedure Evaluation
Post-Anesthesia Evaluation    Name: Diane Savage      MRN: [id number]     DOB: Jun 06, 1955     Age: 61 y.o.     Sex: female   __________________________________________________________________________     Procedure Date: 03/17/2017  Procedure: Procedure(s):  ESOPHAGOGASTRODUODENOSCOPY      Surgeon: Juliann Mule):  Isaiah Blakes, MD  Moishe Spice, MD    Post-Anesthesia Vitals  BP: 124/71 (10/15 1200)  Temp: 36.5 C (97.7 F) (10/15 1130)  Pulse: 81 (10/15 1200)  Respirations: 19 PER MINUTE (10/15 1200)  SpO2: 98 % (10/15 1200)  O2 Delivery: Nasal Cannula (10/15 1130)  SpO2 Pulse: 81 (10/15 1200)      Post Anesthesia Evaluation Note    Evaluation location: pre/post  Patient participation: recovered; patient participated in evaluation  Level of consciousness: alert    Pain score: 0  Pain management: adequate    Hydration: normovolemia  Temperature: 36.0C - 38.4C  Airway patency: adequate    Perioperative Events  Perioperative events:  no       Post-op nausea and vomiting: no PONV    Postoperative Status  Cardiovascular status: hemodynamically stable  Respiratory status: spontaneous ventilation  Additional comments:   Patient discharged at 12:03        Perioperative Events  Perioperative Event: No  Emergency Case Activation: No

## 2017-03-17 NOTE — Progress Notes
IVT for labs

## 2017-03-17 NOTE — Progress Notes
Report given to [facility]. Pt transportation to take pt back to her room via cart.Chest tube to portable suction. Oxygen 3 L/NC. Cardizem ggt infusing per D.O.

## 2017-03-17 NOTE — Progress Notes
0845 CV1 paged regarding patient's chest tube. RN unable to find orders regarding patient's chest tube. Orders for RN to page CTS.     0900 and [phone] CTS rotating pager paged.    0930 CTS fellow paged.      9784: CV1 paged. RN unable to get ahold of CTS. RN informed team that patient needs orders for chest tube and the patient will be hooked up to portable suction when being transported for her EGD. Patient also c/o feeling nauseated. Okay to give early dose of PRN zofran per Dr. Sheppard Coil (see eMAR).

## 2017-03-17 NOTE — Progress Notes
Pt off unit via cart with transportation. Telemetry on standby. Chest tube attached to portable suction.

## 2017-03-17 NOTE — Progress Notes
EGD/Upper EUS/ERCP/Antegrade Enteroscopy  Post Upper Endoscopy Instructions       Start with small sips of water.  If tolerated well, you may advance your diet as tolerated or directed by your physician.      -You may have a sore throat after the procedure for 2-3 days.  Try sucrets or lozenges to help ease the pain.  If it continues please contact us.    -If you feel feverish, have a temperature of 101 degrees or higher, persistent nausea and vomiting, abdominal pain or dark stools; please notify your nurse or GI physician.    -You may have abdominal cramping following the procedure this can be relieved by belching or passing air.    -If you have redness or swelling at the IV site, place a warm, wet washcloth over the affected areas for 15 minutes, 3-4 times a day until the redness subsides.  If symptoms continue for 2-3 days, contact your regular physician.    - If you have bleeding from your mouth, over 2 tablespoons and increasing, please notify your physician.  A small amount of bleeding is normal if a biopsy or polyps were taken.  If you are vomiting blood you need to seek immediate medical attention.    - You may resume all your routine medications, if medications need to be held your physician and/or nurse will notify you post procedure.    SPECIFIC INSTRUCTIONS  INPATIENTS:  Ask for help when you get up in your room, as you may still be drowsy from your sedation.    Should you have any questions or concerns after your procedure please call [phone] M-F 8am-5:00 pm. After 5:00 pm, holidays or weekends call [phone] and ask for the GI Doctor on call.

## 2017-03-17 NOTE — Anesthesia Pre-Procedure Evaluation
Anesthesia Pre-Procedure Evaluation    Name: Diane Savage      MRN: [id number]     DOB: 1956-02-10     Age: 61 y.o.     Sex: female   __________________________________________________________________________     Procedure Date: 03/17/2017   Procedure: Procedure(s):  ESOPHAGOGASTRODUODENOSCOPY     Physical Assessment  Vital Signs (last filed in past 24 hours):  BP: 125/55 (10/15 0839)  Temp: 36.4 ???C (97.6 ???F) (10/15 0741)  Pulse: 79 (10/15 0839)  Respirations: 19 PER MINUTE (10/15 0741)  SpO2: 92 % (10/15 0741)  O2 Delivery: Nasal Cannula (10/15 0741)  Weight: 112.3 kg (247 lb 9.6 oz) (10/15 0446)      Patient History  Allergies   Allergen Reactions   ??? Diltiazem RASH, NAUSEA AND VOMITING and SEE COMMENTS     Sore mouth   ??? Sulfa (Sulfonamide Antibiotics) RASH   ??? Duricef [Cefadroxil] NAUSEA AND VOMITING   ??? Pravastatin NAUSEA AND VOMITING   ??? Simvastatin NAUSEA AND VOMITING        Current Medications    Medication Directions   acetaminophen (TYLENOL) 325 mg tablet Take two tablets by mouth every 6 hours as needed.   aspirin 81 mg chewable tablet Chew one tablet by mouth daily with food.   cetirizine (ZYRTEC) 10 mg tablet Take 10 mg by mouth every morning.   clobetasol (TEMOVATE) 0.05 % topical cream Apply to affected area twice daily as needed   diphenhydrAMINE (BENADRYL ALLERGY) 25 mg tablet Take 25 mg by mouth at bedtime as needed.   folic acid (FOLVITE) 1 mg tablet Take 1 mg by mouth daily.   furosemide (LASIX) 20 mg tablet Take 20 mg by mouth at bedtime daily.   GLUCOSAMINE HCL/CHONDROITIN SU (GLUCOSAMINE-CHONDROITIN PO) Take 1 tablet by mouth twice daily.   insulin aspart U-100 (NOVOLOG) 100 unit/mL injection Inject four Units under the skin three times daily with meals.   insulin NPH (HUMULIN N NPH U-100 INSULIN) 100 unit/mL injection Inject fourteen Units under the skin every morning.   Insulin Syringe-Needle U-100 (BD INSULIN SYRINGE ULTRA-FINE) 0.3 mL 31 gauge x 5/16 syrg Use four times daily with Insulin levothyroxine (SYNTHROID) 175 mcg tablet Take 175 mcg by mouth daily 30 minutes before breakfast.   metoprolol tartrate (LOPRESSOR) 100 mg tablet Take 100 mg by mouth twice daily.   nitroglycerin (NITROSTAT) 0.4 mg tablet Place 0.4 mg under tongue every 5 minutes as needed for Chest Pain. Max of 3 tablets, call 911.   omeprazole DR(+) (PRILOSEC) 40 mg capsule Take 40 mg by mouth twice daily.   ondansetron (ZOFRAN) 8 mg tablet Take 8 mg by mouth every 8 hours as needed for Nausea or Vomiting.   senna/docusate (SENOKOT-S) 8.6/50 mg tablet Take two tablets by mouth twice daily.  Patient taking differently: Take 2 tablets by mouth at bedtime daily.   tiZANidine (ZANAFLEX) 4 mg tablet Take 4 mg by mouth every 6 hours as needed.   traMADol (ULTRAM) 50 mg tablet Take one tablet by mouth every 6 hours as needed for Pain.         Review of Systems/Medical History      Patient summary reviewed  Nursing notes reviewed  Pertinent labs reviewed    PONV Screening: Female gender, Non-smoker, Hx PONV/motion sickness and Postoperative opioids  No history of anesthetic complications  No family history of anesthetic complications      Airway - negative        Pulmonary  Sleep apnea (OSA observed with hospitalization, no formal sleep study or diagnosis)      Cardiovascular       Recent diagnostic studies:          echocardiogram          1.  Technically difficult study despite use of echo contrast agent.  2.  Moderately dilated left ventricle.  Ejection fraction appears to be preserved.  3.  Moderately dilated right ventricle.  Unable to assess function.  4.  Normal pulmonary artery pressure.  5.  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 No evidence of active GI bleeding at this time.      Neuro/Psych         Recent MS changes, improved at this time.      Musculoskeletal         Back pain      Arthritis      Endocrine/Other       Diabetes, type 2      Hypothyroidism      Anemia   Physical Exam    Airway Findings      Mallampati: II      TM distance: >3 FB      Neck ROM: full      Mouth opening: good      Airway patency: adequate    Dental Findings:       Upper dentures    Cardiovascular Findings:       Rhythm: regular      Rate: normal    Pulmonary Findings:       Breath sounds clear to auscultation.    Abdominal Findings:       Obese    Neurological Findings: Negative         Diagnostic Tests  Hematology:   Lab Results   Component Value Date    HGB 8.7 03/17/2017    HCT 26.5 03/17/2017    PLTCT 194 03/17/2017    WBC 7.6 03/17/2017    NEUT 72 03/17/2017    ANC 5.50 03/17/2017    ALC 1.00 03/17/2017    MONA 10 03/17/2017    AMC 0.80 03/17/2017    EOSA 5 03/17/2017    ABC 0.00 03/17/2017    MCV 95.9 03/17/2017    MCH 31.5 03/17/2017    MCHC 32.8 03/17/2017    MPV 9.1 03/17/2017    RDW 18.6 03/17/2017         General Chemistry:   Lab Results   Component Value Date    NA 132 03/17/2017    K 4.7 03/17/2017    CL 100 03/17/2017    CO2 25 03/17/2017    GAP 7 03/17/2017    BUN 34 03/17/2017    CR 2.45 03/17/2017    GLU 251  03/17/2017    CA 9.3 03/17/2017    ALBUMIN 3.2 03/17/2017    LACTIC 0.8 02/05/2017    OBSCA 1.17 02/05/2017    MG 1.7 03/13/2017    TOTBILI 0.6 03/17/2017    PO4 3.3 03/13/2017      Coagulation:   Lab Results   Component Value Date    PTT 26.8 02/04/2017    INR 1.3 02/04/2017     Cath report:   - 80% distal left main,                - moderate LAD disease               -  LCx 80% ostial 70%               -  RCA with 50% lesion and distal 70% disease. - Aortic pressure 104/52, LVp 109/28, LVEDP , normal LV function    Anesthesia Plan    ASA score: 3   Plan: general and MAC  Induction method: intravenous  NPO status: acceptable      Informed Consent  Anesthetic plan and risks discussed with patient.  Use of blood products discussed with patient  Blood Consent: consented      Plan discussed with: CRNA and anesthesiologist.  Comments: (Pt with recent CABG now with GIB going for EGD)

## 2017-03-18 DIAGNOSIS — I4891 Unspecified atrial fibrillation: Secondary | ICD-10-CM

## 2017-03-18 LAB — URINALYSIS DIPSTICK REFLEX TO CULTURE
Lab: NEGATIVE
Lab: NEGATIVE
Lab: NEGATIVE

## 2017-03-18 LAB — URINALYSIS MICROSCOPIC REFLEX TO CULTURE

## 2017-03-18 LAB — CREATININE-URINE RANDOM: Lab: 244 mg/dL

## 2017-03-18 LAB — OSMOLALITY-URINE RANDOM: Lab: 364 mosm/kg (ref 50–1400)

## 2017-03-18 LAB — UREA NITROGEN-URINE RANDOM: Lab: 311 mg/dL

## 2017-03-18 LAB — CBC AND DIFF: Lab: 7 K/UL — ABNORMAL LOW (ref 4.5–11.0)

## 2017-03-18 LAB — POC GLUCOSE
Lab: 173 mg/dL — ABNORMAL HIGH (ref 70–100)
Lab: 195 mg/dL — ABNORMAL HIGH (ref 70–100)
Lab: 187 mg/dL — ABNORMAL HIGH (ref 70–100)
Lab: 138 mg/dL — ABNORMAL HIGH (ref 70–100)

## 2017-03-18 LAB — COMPREHENSIVE METABOLIC PANEL: Lab: 133 MMOL/L — ABNORMAL LOW (ref 137–147)

## 2017-03-18 MED ORDER — DILTIAZEM HCL 30 MG PO TAB
45 mg | ORAL | 0 refills | Status: DC
Start: 2017-03-18 — End: 2017-03-21
  Administered 2017-03-18 – 2017-03-21 (×12): 45 mg via ORAL

## 2017-03-18 MED ORDER — LORAZEPAM 1 MG PO TAB
1 mg | Freq: Once | ORAL | 0 refills | Status: CP
Start: 2017-03-18 — End: ?
  Administered 2017-03-19: 02:00:00 1 mg via ORAL

## 2017-03-18 MED ORDER — HEPARIN, PORCINE (PF) 5,000 UNIT/0.5 ML IJ SYRG
5000 [IU] | SUBCUTANEOUS | 0 refills | Status: DC
Start: 2017-03-18 — End: 2017-03-21
  Administered 2017-03-18 – 2017-03-21 (×8): 5000 [IU] via SUBCUTANEOUS

## 2017-03-18 NOTE — Progress Notes
OCCUPATIONAL THERAPY  ASSESSMENT NOTE    Patient Name: Diane Savage                   Room/Bed: ZO109/60  Admitting Diagnosis:   Past Medical History:   Diagnosis Date   ??? Acquired hypothyroidism    ??? Arthritis    ??? Back pain    ??? Bleeding disorder (HCC)    ??? CAD (coronary artery disease), native coronary artery 02/04/2017   ??? Coronary artery disease    ??? Diabetes mellitus (HCC)     Type II   ??? Essential hypertension 02/11/2017   ??? Hypertension    ??? Stomach disorder    ??? Vision decreased        Mobility  Progressive Mobility Level: Walk in room  Distance Walked (feet): 5 ft  Level of Assistance: Assist X1  Assistive Device: None  Time Tolerated: 0-10 minutes  Activity Limited By: Fatigue;Weakness;Lines / Medical Devices    Subjective  Pertinent Dx per Physician: s/p CABG/MAZE/ ligation LAA on Dr. Farris Has on 02/04/17 admitted 03/13/17 with atrial fibrillation and left pleural effusion. Chest tube placed 10/12.  Precautions: Falls;O2 Requirement (chest tube)  Pain / Complaints: Patient agrees to participate in therapy    Objective  Psychosocial Status: Willing and Cooperative to Participate  Persons Present: Spouse    Home Living  Type of Home: House  Home Layout: One Level;Ramped Entrance    Prior Function  Level Of Independence: Independent with ADLs and functional transfers  Lives With: Spouse  Other Function Comments: Spouse in power scooter. Patient states she was not having difficulty with ADLS or mobility at home since CABG    ADL's  Where Assessed: Chair  Eating Assist: Independent  Eating Deficits: No Assist Needed  Functional Transfer Assist: Minimal Assist (contact guard)  Functional Transfer Deficits: Steadying;Increased Time to Complete  Comment: Min assist for bed mobility as patient requested therapists had to assist with trunk. Patient able to take steps to chair with contact guard assist for steadying.    Activity Tolerance  Endurance: 1/5 Tolerates <10 Minutes Exercises, No Significant Change in Vital Signs  Sitting Balance: 3+/5 Sits w/o UE Support for 30 Seconds or Greater    Cognition  Overall Cognitive Status: WFL to Adequately Complete Self Care Tasks Safely    Education  Persons Educated: Patient  Teaching Methods: Verbal Instruction  Patient Response: Verbalized and Demo Understanding  Topics: Role of OT, Goals for Therapy  Goal Formulation: With Patient    Assessment  Assessment: Decreased ADL Status;Decreased Endurance;Decreased Self-Care Trans;Decreased High-Level ADLs  Prognosis: Good;w/Cont OT s/p Acute Discharge  Goal Formulation: Patient    AM-PAC 6 Clicks Daily Activity Inpatient  Putting on and taking off regular lower body clothes?: A Lot  Bathing (Including washing, rinsing, drying): A Lot  Toileting, which includes using toilet, bedpan, or urinal: A Little  Putting on and taking off regular upper body clothing: A Little  Taking care of personal grooming such as brushing teeth: None  Eating meals?: None  Daily Activity Raw Score: 18  Standardized (t-scale) score: 38.66  CMS 0-100% Score: 46.65  CMS G Code Modifier: CK    Plan  OT Frequency: 5x/week  OT Plan for Next Visit: grooming at sink, ambulate to toilet, lower body dressing in chair    ADL Goals  Patient Will Perform Grooming: Standing at Sink;w/ Stand By Assist  Patient Will Perform Toileting: w/ Stand By Assist    Functional Transfer Goals  Pt Will Perform All Functional Transfers: w/ Stand By Assist    OT Discharge Recommendations  OT Discharge Recommendations: Home with family assist, Home with Home Health  Equipment Recommendations: Too Diane to be determined        Therapist: Georgena Spurling OTR/L [id number]  Date: 03/18/2017

## 2017-03-18 NOTE — Progress Notes
PHYSICAL THERAPY  ASSESSMENT     MOBILITY:  Mobility  Progressive Mobility Level: Walk in hallway  Distance Walked (feet): 70 ft  Level of Assistance: Assist X1  Assistive Device: None  Time Tolerated: 11-30 minutes  Activity Limited By: Fatigue;Weakness    SUBJECTIVE:  Subjective  Significant hospital events: s/p CABG/MAZE/ ligation LAA on Dr. Farris Has on 02/04/17 admitted 03/13/17 with atrial fibrillation and left pleural effusion. Chest tube placed 10/12.  Mental / Cognitive Status: Alert;Cooperative;Follows Commands  Persons Present: Spouse  Pain: Patient complains of pain;Before activity;Patient does not rate pain  Pain Location: Back  Pain Description: Aching  Pain Interventions: Patient agrees to participate in therapy  Comments: Patient on 2L supplemental oxygen via NC this date. Chest tube to water seal.   Ambulation Assist: Independent Mobility at Household Level without Device  Patient Owned Equipment: Camera operator  Home Situation: Lives with Family  Type of Home: House  Entry Stairs: Ramp  In-Home Stairs: Able to Live on One Level  Comments: Patient denies fall in the last month since previous admission in September.     STRENGTH:  Strength  Strength Position Assessed: Seated  Overall Strength: Able to Move All Joints Independently Through Available ROM    POSTURE/NEURO:  Posture / Neurological  Head Control: Independent  Posture: Rounded Shoulders  Overall Tone: Normal    BED MOBILITY/TRANSFERS:  Bed Mobility/Transfers  Comments: Patient in bedside chair at start of session and sat edge of bed at conclusion. Bed mobility not assessed this date.   Transfer Type: Sit to Stand  Transfer: Assistance Level: From;Bed Side Chair;Standby Assist  Transfer: Assistive Device: None  Transfers: Type Of Assistance: For Safety Considerations  Other Transfer Type: Stand to Sit  Other Transfer: Assistance Level: To;Bed;Standby Assist  Other Transfer: Assistive Device: None Other Transfer: Type Of Assistance: For Safety Considerations  End Of Activity Status: Sitting at [facility] of Bed;Nursing Notified;Instructed Patient to Request Assist with Mobility;Instructed Patient to Use Call Light    BALANCE:  Balance  Sitting Balance: Static Sitting Balance;Dynamic Sitting Balance;Standby Assist;Even Surface  Standing Balance: Static Standing Balance;Dynamic Standing Balance;No UE support;Minimal Assist;Even Surface (Contact-guard assist)    GAIT:  Gait  Gait Distance: 70 feet  Gait: Assistance Level: Minimal Assist (Contact-guard assist)  Gait: Assistive Device: None  Gait: Descriptors: Pace: Slow;Normal step length  Comments: Patient ambulates with wide base of support and decreased arm swing bilaterally this date.   Activity Limited By: Complaint of Fatigue;Weakness    EDUCATION:  Education  Persons Educated: Patient/Family  Patient Barriers To Learning: None Noted  Interventions: Repetition of Instructions;Family Education  Teaching Methods: Verbal Instruction  Patient Response: Verbalized Understanding;More Instruction Required  Topics: Plan/Goals of PT Interventions;Mobility Progression;Safety Awareness;Up with Assist Only;Importance of Increasing Activity;Ambulate With Nursing;Recommend Continued Therapy    ASSESSMENT/PROGRESS:  Assessment/Progress  Impaired Mobility Due To: Decreased Activity Tolerance;Safety Concerns;Impaired Balance;Decreased Strength  Assessment/Progress: Should Improve w/ Continued PT  AM-PAC 6 Clicks Basic Mobility Inpatient  Turning from your back to your side while in a flat bed without using bed rails: A Little  Moving from lying on your back to sitting on the side of a flatbed without using bedrails : A Little  Moving to and from a bed to a chair (including a wheelchair): A Little  Standing up from a chair using your arms (e.g. wheelchair, or bedside chair): A Little  To walk in hospital room: A Little  Climbing 3-5 steps with a railing: A Lot  Raw Score:  17 Standardized (T-scale) Score: 39.67  Basic Mobility CMS 0-100%: 43.83  CMS G Code Modifier for Basic Mobility: CK    GOALS:  Goals  Goal Formulation: With Patient/Family  Time For Goal Achievement: 3 days, To, 5 days  Pt Will Go Supine To/From Sit: Independently  Pt Will Transfer Bed/Chair: Independently  Pt Will Transfer Sit to Stand: Independently  Pt Will Ambulate: 101-150 Feet, w/ No Device, w/ Stand By Assist    PLAN:  Plan   Treatment Interventions: Mobility Training;Endurance Training  Plan Frequency: 5 Days per Week  PT Plan for Next Visit: Progress gait distance and upright activity tolerance as able.     RECOMMENDATIONS:  PT Discharge Recommendations  PT Discharge Recommendations: Home with Assistance;and;Home Health Setting  Equipment Recommendations: Patient owns necessary equipment    Therapist: Edward Jolly, PT , DPT  Date: 03/18/2017

## 2017-03-18 NOTE — Case Management (ED)
Case Management Progress Note    NAME:Diane Savage                          MRN: [id number]              DOB:[birth year]          AGE: 61 y.o.  ADMISSION DATE: 03/13/2017             DAYS ADMITTED: LOS: 5 days      Todays Date: 03/18/2017    Plan  Pt anticipated to d/c to home when medically stable    Interventions  ? Support      ? Info or Referral      ? Discharge Planning    Pt discussed in huddle with team. Pt started on diltiazem - per team low cost medication. Anticipate initiation of antiarrhythmic, SW discussed with team need for low cost option as pt does not have insurance coverage currently - pt applying for Medicaid.     PT recommending home with assist; and HH. OT recommending home with family assist, home with HH.     ? Medication Needs      ? Financial      ? Legal      ? Other        Disposition  ? Expected Discharge Date    Expected Discharge Date: 03/21/17  ? Transportation   Does the patient need discharge transport arranged?: No  Transportation Name, Phone and Availability #1: Rush Landmark (husband) [phone]  Does the patient use Medicaid Transportation?: No  ? Next Level of Care (Acute Psych discharges only)      ? Discharge Disposition                                          Durable Medical Equipment     No service has been selected for the patient.      [facility]     No service has been selected for the patient.      [place]     No service has been selected for the patient.      [facility]     No service has been selected for the patient.          Currie Paris, LMSW  p. [phone]

## 2017-03-18 NOTE — Progress Notes
RT Adult Assessment Note    NAME:Diane Savage             MRN: [id number]             DOB:04/12/1956          AGE: 61 y.o.  ADMISSION DATE: 03/13/2017             DAYS ADMITTED: LOS: 5 days    RT Treatment Plan:       Protocol Plan: Procedures  PEP Therapy: Place a nursing order for "IS Q1h While Awake" for any of Lung Expansion indicators  Oxygen/Humidity: O2 to keep SpO2 > 92%  Monitoring: Pulse oximetry BID & PRN    Additional Comments:  Impressions of the patient: sleepy   Intervention(s)/outcome(s): venti mask 40%      Vital Signs:  Pulse: Pulse: 75  RR: Respirations: 18 PER MINUTE  SpO2: SpO2: 97 %  O2 Device: $$ O2 Device: Venturi Mask  O2%: O2 Percent: 40 %  Breath Sounds:  c/d  Respiratory Effort: Respiratory Effort: Non-Labored

## 2017-03-18 NOTE — Progress Notes
CTS update note:    Diane Savage is s/p CABG/MAZE/ ligation LAA on Dr. Alfonso Ramus on 02/04/17 admitted 03/13/17 with atrial fibrillation and left pleural effusion.     GI consulted for anticoagulation recommendations as she has h/o GI bleeds. 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     VS: afebrile, A fib/Aflutter rate 70-80s, BP 90-130s, 92% on 2-3L    On cardizem gtt @ 5mg /hr.    Labs:  Hgb 8.5-->7.9-->8.8-->8.7-->8.4  Cr 1.6-->1.49-->2.05-->2.45-->2.80, diuresis held    L pigtail placed in IR 10/12. CXR today with continued L effusion. Pleural CT with 212cc/24hr out.     Plan:  1. Cont L pigtail for now.  Will reassess output tomorrow.  If <100cc/24hr will dc tube.  2. 2V CXR tomorrow, followed by daily portable CXRs (ordered) until CT out.   3. Okay to start anticoagulation from CTS standpoint with CT in place. Will be good marker for risk of re-accumulation of pleural effusion.   4. Cont management per primary team.

## 2017-03-18 NOTE — Progress Notes
Pt complaining about her chest tube, stating she wants it out and that it is causing her anxiety and pain. RN tried to redirect patient, and adminsitered pain medication and anxiety medication, and stated that the goal is to have less that 100 ml out in 24 hours. RN stated multiple times that she wanted to speak with a MD about taking the tube out. CV[facility] paged, Dr. Sheppard Coil up to see patient. Stated will follow up with CTS regarding chest tube.

## 2017-03-18 NOTE — Progress Notes
Gastroenterology Progress Note  Patient Name:Diane Savage         UEA:[id number]  Admission Date: 03/13/2017  2:56 PM      Principal Problem:    Atrial fibrillation with rapid ventricular response (HCC)  Active Problems:    Type 2 diabetes mellitus with circulatory disorder, without long-term current use of insulin (HCC)    PAF (paroxysmal atrial fibrillation) (HCC)    Chronic gastrointestinal bleeding    Transfusion-dependent anemia    CAD (coronary artery disease), native coronary artery    HLD (hyperlipidemia)    Hypothyroidism    AKI (acute kidney injury) (HCC)    Essential hypertension    Pleural effusion on left    Left leg cellulitis    Barrett's esophagus      Reason for consult: chronic anemia, presumed recurrent GIB    Assessment:  61 yo female p/w SOB and atrial fibrillation. PMH significant for CAD, STEMI, prior CABG 9/28 with subsequent development of atrial fibrillation/flutter not currently on anticoagulation, DM, hypertension.  GI is consulted for evaluation of anticoagulation risk and presumed chronic GI bleeding.    #Presumed GI bleeding, recurrent  #GAVE  #Esophageal varices  #Need for recurrent blood transfusions  #Atrial fibrillation/flutter, not currently anticoagulated  #Recent CABG procedure    Recommendations:  ???EGD performed yesterday findings significant for small amount of gain, portal hypertensive gastropathy, grade 1 esophageal varices.  No APC performed due to low utility of gave in the setting of above findings  ???Spoke to the patient at length regarding the risks and benefits of anticoagulation versus no anticoagulation given her high risk atrial fibrillation.  From the GI perspective the patient is relatively safe to undergo anticoagulation to reduce her ischemic stroke risk.  However the patient is also been counseled that she is high risk for recurrent GI bleed given the above findings.  Patient elects to undergo anticoagulation at this time to lower her ischemic stroke risk and accepts the fact that she may be increasing her risk of recurrent GI bleed, which may warrant further interventions and resuscitative measures  ???Patient should have follow-up in the outpatient hematology clinic given her findings of esophageal varices, portal hypertensive gastropathy, splenomegaly previously seen on imaging patient's last imaging of her liver showed fatty steatosis and hepatomegaly, no cirrhotic architecture  ???GI will sign off at this point. If there are any further questions, please do not hesitate to give [place] a call.    Patient discussed with attending on service, Dr. Carver Fila A. Joanne Gavel, DO  Pager [phone]  GI Fellow  03/18/2017 6:32 PM       -----------------------------  Interval Events  No acute events overnight  EGD performed yesterday with above findings  Feeling well this morning, hemoglobin relatively stable currently 8.4.    The patient feels otherwise well, denies any fever/chills, SOB, chest pain, N/V, abd pain, dysuria.    PMH:  Past Medical History:   Diagnosis Date   ??? Acquired hypothyroidism    ??? Arthritis    ??? Back pain    ??? Bleeding disorder (HCC)    ??? CAD (coronary artery disease), native coronary artery 02/04/2017   ??? Coronary artery disease    ??? Diabetes mellitus (HCC)     Type II   ??? Essential hypertension 02/11/2017   ??? Hypertension    ??? Stomach disorder    ??? Vision decreased        Current medications:  No current facility-administered medications on file  prior to encounter.      Current Outpatient Prescriptions on File Prior to Encounter   Medication Sig Dispense Refill   ??? acetaminophen (TYLENOL) 325 mg tablet Take two tablets by mouth every 6 hours as needed.  0   ??? aspirin 81 mg chewable tablet Chew one tablet by mouth daily with food. 90 tablet 3   ??? cetirizine (ZYRTEC) 10 mg tablet Take 10 mg by mouth every morning.     ??? folic acid (FOLVITE) 1 mg tablet Take 1 mg by mouth daily. ??? GLUCOSAMINE HCL/CHONDROITIN SU (GLUCOSAMINE-CHONDROITIN PO) Take 1 tablet by mouth twice daily.     ??? insulin aspart U-100 (NOVOLOG) 100 unit/mL injection Inject four Units under the skin three times daily with meals. 10 mL 30   ??? insulin NPH (HUMULIN N NPH U-100 INSULIN) 100 unit/mL injection Inject fourteen Units under the skin every morning. 10 mL 30   ??? Insulin Syringe-Needle U-100 (BD INSULIN SYRINGE ULTRA-FINE) 0.3 mL 31 gauge x 5/16 syrg Use four times daily with Insulin 100 each 0   ??? levothyroxine (SYNTHROID) 175 mcg tablet Take 175 mcg by mouth daily 30 minutes before breakfast.     ??? nitroglycerin (NITROSTAT) 0.4 mg tablet Place 0.4 mg under tongue every 5 minutes as needed for Chest Pain. Max of 3 tablets, call 911.     ??? omeprazole DR(+) (PRILOSEC) 40 mg capsule Take 40 mg by mouth twice daily.     ??? ondansetron (ZOFRAN) 8 mg tablet Take 8 mg by mouth every 8 hours as needed for Nausea or Vomiting.     ??? senna/docusate (SENOKOT-S) 8.6/50 mg tablet Take two tablets by mouth twice daily. (Patient taking differently: Take 2 tablets by mouth at bedtime daily.) 20 tablet 0   ??? traMADol (ULTRAM) 50 mg tablet Take one tablet by mouth every 6 hours as needed for Pain. 30 tablet 0       PSH:  Past Surgical History:   Procedure Laterality Date   ??? HX HEART CATHETERIZATION  2017   ??? CORONARY STENT PLACEMENT  2017   ??? CORONARY ARTERY BYPASS GRAFT N/A 02/04/2017    CORONARY ARTERY BYPASS WITH ARTERIAL GRAFT - 4 GRAFTS (Internal Mammary Artery and Endovascular Vein Harvest) performed by Collene Schlichter, MD at [facility]   ??? HX MAZE N/A 02/04/2017    MAZE PROCEDURE performed by Collene Schlichter, MD at [facility]   ??? ANKLE SURGERY      Left ankle reconstruction   ??? COLONOSCOPY     ??? HX KNEE ARTHROSCOPY Left    ??? HX TONSIL AND ADENOIDECTOMY     ??? TUBAL LIGATION     ??? UPPER GASTROINTESTINAL ENDOSCOPY         SH:  Social History     Social History   ??? Marital status: Married     Spouse name: N/A   ??? Number of children: N/A ??? Years of education: N/A     Occupational History   ??? Not on file.     Social History Main Topics   ??? Smoking status: Former Smoker     Packs/day: 2.00     Years: 10.00     Types: Cigarettes     Quit date: 09/16/1980   ??? Smokeless tobacco: Never Used   ??? Alcohol use Yes      Comment: Seldom   ??? Drug use: No   ??? Sexual activity: Not on file     Other Topics Concern   ???  Not on file     Social History Narrative   ??? No narrative on file       FH:  Family History   Problem Relation Age of Onset   ??? Diabetes Mother    ??? Heart Disease Mother    ??? High Cholesterol Mother    ??? Arthritis-rheumatoid Mother    ??? Stroke Mother    ??? Thyroid Disease Mother    ??? Coronary Artery Disease Mother    ??? Hyperlipidemia Mother    ??? Cancer Father    ??? Cancer-Breast Sister    ??? Cancer Sister    ??? Hypertension Sister    ??? Heart Disease Sister    ??? High Cholesterol Sister    ??? Arthritis-osteo Sister    ??? Migraines Sister    ??? Rashes/Skin Problems Sister    ??? Depression Sister    ??? Hyperlipidemia Sister    ??? Cancer Brother    ??? Hypertension Brother    ??? Arthritis-rheumatoid Brother    ??? Rashes/Skin Problems Brother    ??? Coronary Artery Disease Brother    ??? Heart Disease Maternal Aunt    ??? Coronary Artery Disease Paternal Uncle    ??? Hyperlipidemia Paternal Uncle    ??? Cancer Maternal Grandmother    ??? Diabetes Maternal Grandmother    ??? Heart Disease Maternal Grandmother    ??? Cancer-Colon Paternal Grandmother    ??? Cancer Paternal Grandmother    ??? Diabetes Paternal Grandmother    ??? Cancer Paternal Grandfather        Physical Exam:  Vitals:    03/18/17 1521 03/18/17 1548 03/18/17 1816 03/18/17 1817   BP:  106/61     Pulse:  77     Temp:  36.6 ???C (97.9 ???F)     SpO2: 94% 92% (!) 86% 92%   Weight:       Height:         General - Alert and oriented, no acute distress.   Head - Normocephalic, atraumatic.   Eyes -  EOMI grossly. No icterus or injection.   Oropharynx- No ulcer or bleeding, moist mucosa.  Neck - No swelling or tracheal deviation. Abd -  Soft, non TTP, non distended, normal bowel sounds, no hepatospenomegaly.  Extremities - Warm, dry.   Skin - No exposed rash, lesion.  Neurological - No gross deficit    Labs/Imaging:  Pertient labs/imaging was reviewed on initiation of progress note.

## 2017-03-18 NOTE — Progress Notes
General Progress Note    Name:  Diane Savage   AOZHY'Q Date:  03/17/2017  Admission Date: 03/13/2017  LOS: 4 days                     Assessment/Plan:    Principal Problem:    Atrial fibrillation with rapid ventricular response (HCC)  Active Problems:    Type 2 diabetes mellitus with circulatory disorder, without long-term current use of insulin (HCC)    PAF (paroxysmal atrial fibrillation) (HCC)    Chronic gastrointestinal bleeding    Transfusion-dependent anemia    CAD (coronary artery disease), native coronary artery    HLD (hyperlipidemia)    Hypothyroidism    Essential hypertension    Pleural effusion on left    Left leg cellulitis    Diane Savage is a 61 y.o. female, the patient has a past medical history of Acquired hypothyroidism; Arthritis; Back pain; Bleeding disorder (HCC); Coronary artery disease; Diabetes mellitus (HCC); Hypertension; Stomach disorder; and Vision decreased. presenting with shortness of breath, dizziness/lightheadedness, chills, nausea, lower extremity swelling and palpitations.  ???  1. Atrial fluter, not on AC  - has had intermittent atrial flutter since CABG  - not on AC due to history of GIB  - has had palpitations for about 1 week prior to presentation, no CP or syncope  - EKG on admission: atrial fibrillation, rate 98, no ST changes  - O/N atrial flutter, and atrial fibrillation, intermittently  -EGD showing 3 columns of esophageal varices, mild portal hypertensive gastropathy without bleeding, gave without bleeding and gastric antrum, mild, and possible Barrett's esophagus, no biopsies taken               Plan for today:                > Continue ASA and Metoprolol, diltiazem drip, titrate heart rate from 60-100   > Plan on switching diltiazem drip to p.o. 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  2. Shortness of Breath/Lower extremity swelling, concern for DVT  - LLE swelling greater than right since surgery  - has history of DVT  - BNP 327 on admission   - CXR with persistent left sided pleural effusion   - IR drain and Chest tube on left side, 120 cc overnight  -CTS okay with anticoagulation while chest tube is in place               Plan for today:               >  Discontinue Lasix drip, held Lasix today due to AK I    > BC pending    > f/u urine output    3.  Cellulitis of lower extremity, left  -Amoxicillin started on 10/12, end 10/15  -Could potentially be adding to AK I  Plan:   >Discontinue amoxicillin   >Start doxycycline, end 03/23/17  ???  3. 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  4. History of GIB  - has had extensive work up and evaluation by GI  - frequent upper/lower GIB  - requires frequent blood transfusions               Plan for today:                > GI consulted, EGD on Monday, ok to use anticoagulation               > transfuse 1U pRBC on 10/12    5. Hypothyroidism  > Continue Synthroid  ???  6. HLD  > Continue PTA statin  ???  7. HTN  > Continue metoprolol  ???  8. DMII   - PTA regimen: NPH 18u QD, Novolog 4u TID w/ meals  > Continue PTA NPH, add on mid dose correction factor  ???  Fluids, electrolytes and Nutrition:  IVF: no IVF  Electrolytes: Monitor and replace PRN.  Diet:  Cardiac Diet  ???  Prophylaxis:  DVT: SCD  Stress ulcer: PPI  ???  Code status: Full Code  Disposition: admit to [facility]  ???  Patient seen and discussed with Dr. Duane Lope.    Ricard Dillon DO  Internal medicine PGY 1  Pager [phone]    ________________________________________________________________________    Subjective Patient seen and examined this morning.  No acute events overnight.  Very anxious this morning mainly due to having chest tube still in.  States that shortness of breath is improved, lower extremity edema is baseline for now.  No fevers chills, nausea, vomiting, chest pain, chest tightness, abdominal pain, diarrhea, constipation.    Medications  Scheduled Meds:    aspirin chewable tablet 81 mg 81 mg Oral QDAY   cetirizine (ZYRTEC) tablet 10 mg 10 mg Oral QAM8   doxycycline (VIBRAMYCIN) tablet 100 mg 100 mg Oral BID   folic acid (FOLVITE) tablet 1 mg 1 mg Oral QDAY   insulin aspart U-100 (NOVOLOG FLEXPEN) injection PEN 0-14 Units 0-14 Units Subcutaneous ACHS   insulin NPH (HUMULIN N KwikPen) injection PEN 18 Units 18 Units Subcutaneous QDAY(07)   levothyroxine (SYNTHROID) tablet 175 mcg 175 mcg Oral QDAY 30 min before breakfast   metoprolol tartrate (LOPRESSOR) tablet 100 mg 100 mg Oral BID   nystatin (NYSTOP) topical powder  Topical BID   pantoprazole DR (PROTONIX) tablet 80 mg 80 mg Oral QDAY(21)   rosuvastatin (CRESTOR) tablet 40 mg 40 mg Oral QDAY   senna/docusate (SENOKOT-S) tablet 2 tablet 2 tablet Oral BID   sucralfate (CARAFATE) tablet 1 g 1 g Oral QID   Continuous Infusions:  ??? diltiazem (cardIZEM) 125 mg in sodium chloride 0.9% (NS) 125 mL IV drip (std conc) 5 mg/hr (03/17/17 1442)   ??? lactated ringers infusion     ??? sodium chloride 0.9 %   infusion       PRN and Respiratory Meds:acetaminophen Q6H PRN, fentaNYL citrate PF Intra-procedure Med, hydrOXYzine TID PRN, midazolam Intra-procedure Med, nitroglycerin Q5 MIN PRN, ondansetron Q8H PRN, traMADol Q6H PRN      Review of Systems:  A 14 point review of systems was negative except for: Discussed in subjective    Objective:                          Vital Signs: Last Filed  Vital Signs: 24 Hour Range   BP: 129/72 (10/15 1400)  Temp: 36.5 ???C (97.7 ???F) (10/15 1400)  Pulse: 81 (10/15 1400)  Respirations: 18 PER MINUTE (10/15 1400) SpO2: 98 % (10/15 1400)  O2 Delivery: Nasal Cannula (10/15 1400)  SpO2 Pulse: 81 (10/15 1200)  Height: 162.6 cm (64.02) (10/15 1044) BP: (84-138)/(34-81)   Temp:  [36.4 ???C (97.6 ???F)-36.8 ???C (98.3 ???F)]   Pulse:  [59-84]   Respirations:  [14 PER MINUTE-23 PER MINUTE]   SpO2:  [92 %-100 %]   O2 Delivery: Nasal Cannula   Intensity Pain Scale (Self Report): 4 (03/16/17 2330) Vitals:    03/16/17 0924 03/17/17 0446 03/17/17 1044   Weight: 112.6 kg (248 lb 4 oz) 112.3 kg (247 lb 9.6 oz) 113.8 kg (250 lb 14.1 oz)       Intake/Output Summary:  (Last 24 hours)    Intake/Output Summary (Last 24 hours) at 03/17/17 1916  Last data filed at 03/17/17 1438   Gross per 24 hour   Intake              500 ml   Output              180 ml   Net              320 ml      Stool Occurrence: 1    Physical Exam  General:  Alert, cooperative, no distress, appears stated age, uncomfortable  Eyes:  Conjunctivae/corneas clear.  EOMI bilaterally, no conjunctival injection  Lungs:  Clear to auscultation bilaterally decreased breath sounds on the left  Heart:   Regular rhythm, tachycardic, S1, S2 normal, no appreciable murmur  Abdomen:  Soft, non-tender.  Bowel sounds normal.  No masses.  No organomegaly.  Extremities: Bilateral lower extremities.  1+ pitting edema bilaterally, mildly worse on the left side, there is a scar assumably where the vein graft was harvested, erythema improved, without any purulence noted  Skin: Scar on left leg from saphenous vein graft without purulence, there is erythema, warmth is equal bilaterally  Neurologic:   CNII - XII intact.  Normal strength, sensation and reflexes throughout.  Psych:   A and O x4 normal mood and affect    Lab Review  24-hour labs:    Results for orders placed or performed during the hospital encounter of 03/13/17 (from the past 24 hour(s))   POC GLUCOSE    Collection Time: 03/16/17  9:23 PM   Result Value Ref Range    Glucose, POC 241 (H) 70 - 100 MG/DL   CBC AND DIFF Collection Time: 03/17/17  5:00 AM   Result Value Ref Range    White Blood Cells 7.6 4.5 - 11.0 K/UL    RBC 2.76 (L) 4.0 - 5.0 M/UL    Hemoglobin 8.7 (L) 12.0 - 15.0 GM/DL    Hematocrit 16.1 (L) 36 - 45 %    MCV 95.9 80 - 100 FL    MCH 31.5 26 - 34 PG    MCHC 32.8 32.0 - 36.0 G/DL    RDW 09.6 (H) 11 - 15 %    Platelet Count 194 150 - 400 K/UL    MPV 9.1 7 - 11 FL    Neutrophils 72 41 - 77 %    Lymphocytes 13 (L) 24 - 44 %    Monocytes 10 4 - 12 %    Eosinophils 5 0 - 5 %    Basophils 0 0 - 2 %  Absolute Neutrophil Count 5.50 1.8 - 7.0 K/UL    Absolute Lymph Count 1.00 1.0 - 4.8 K/UL    Absolute Monocyte Count 0.80 0 - 0.80 K/UL    Absolute Eosinophil Count 0.30 0 - 0.45 K/UL    Absolute Basophil Count 0.00 0 - 0.20 K/UL   COMPREHENSIVE METABOLIC PANEL    Collection Time: 03/17/17  5:00 AM   Result Value Ref Range    Sodium 132 (L) 137 - 147 MMOL/L    Potassium 4.7 3.5 - 5.1 MMOL/L    Chloride 100 98 - 110 MMOL/L    Glucose 251 (H) 70 - 100 MG/DL    Blood Urea Nitrogen 34 (H) 7 - 25 MG/DL    Creatinine 1.61 (H) 0.4 - 1.00 MG/DL    Calcium 9.3 8.5 - 09.6 MG/DL    Total Protein 7.5 6.0 - 8.0 G/DL    Total Bilirubin 0.6 0.3 - 1.2 MG/DL    Albumin 3.2 (L) 3.5 - 5.0 G/DL    Alk Phosphatase 66 25 - 110 U/L    AST (SGOT) 27 7 - 40 U/L    CO2 25 21 - 30 MMOL/L    ALT (SGPT) 7 7 - 56 U/L    Anion Gap 7 3 - 12    eGFR Non African American 20 (L) >60 mL/min    eGFR African American 24 (L) >60 mL/min   POC GLUCOSE    Collection Time: 03/17/17  8:37 AM   Result Value Ref Range    Glucose, POC 215 (H) 70 - 100 MG/DL   POC GLUCOSE    Collection Time: 03/17/17 10:32 AM   Result Value Ref Range    Glucose, POC 203 (H) 70 - 100 MG/DL   POC GLUCOSE    Collection Time: 03/17/17 11:33 AM   Result Value Ref Range    Glucose, POC 214 (H) 70 - 100 MG/DL   CREATININE-URINE RANDOM    Collection Time: 03/17/17  4:02 PM   Result Value Ref Range    Creatinine, Random 194 MG/DL   SODIUM-URINE RANDOM    Collection Time: 03/17/17  4:02 PM Result Value Ref Range    Sodium, Random 12 MMOL/L   UREA NITROGEN-URINE RANDOM    Collection Time: 03/17/17  4:02 PM   Result Value Ref Range    Urea Nitrogen 247 MG/DL   OSMOLALITY-URINE RANDOM    Collection Time: 03/17/17  4:02 PM   Result Value Ref Range    Osmolality-Urine 342 50 - 1,400 MOS/KG   POC GLUCOSE    Collection Time: 03/17/17  5:07 PM   Result Value Ref Range    Glucose, POC 207 (H) 70 - 100 MG/DL       Point of Care Testing  (Last 24 hours)  Glucose: (!) 251 (03/17/17 0500)  POC Glucose (Download): (!) 207 (03/17/17 1707)    Radiology and other Diagnostics Review:    None    Ricard Dillon, DO   Internal medicine PGY 1  Pager [phone]

## 2017-03-18 NOTE — Progress Notes
General Progress Note    Name:  Diane Savage   ZOXWR'U Date:  03/18/2017  Admission Date: 03/13/2017  LOS: 5 days                     Assessment/Plan:    Principal Problem:    Atrial fibrillation with rapid ventricular response (HCC)  Active Problems:    Type 2 diabetes mellitus with circulatory disorder, without long-term current use of insulin (HCC)    PAF (paroxysmal atrial fibrillation) (HCC)    Chronic gastrointestinal bleeding    Transfusion-dependent anemia    CAD (coronary artery disease), native coronary artery    HLD (hyperlipidemia)    Hypothyroidism    AKI (acute kidney injury) (HCC)    Essential hypertension    Pleural effusion on left    Left leg cellulitis    Barrett's esophagus    Diane Savage is a 61 y.o. female, the patient has a past medical history of Acquired hypothyroidism; Arthritis; Back pain; Bleeding disorder (HCC); Coronary artery disease; Diabetes mellitus (HCC); Hypertension; Stomach disorder; and Vision decreased. presenting with shortness of breath, dizziness/lightheadedness, chills, nausea, lower extremity swelling and palpitations.  ???  1. 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 2. Shortness of Breath/Lower extremity swelling, concern for DVT  - LLE swelling greater than right since surgery  - has history of DVT  - BNP 327 on admission   - CXR with persistent left sided pleural effusion   - IR drain and Chest tube on left side, 120 cc overnight  -CTS okay with anticoagulation while chest tube is in place              Plan for today:   > BC pending   > required venturi mask overnight, but back to nasal canula today, will monitor     3.  Cellulitis of lower extremity, left  - Amoxicillin started on 10/12, end 10/15  Plan:  >continue doxycycline, end 03/23/17  ???  3. 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  4. History of GIB  - has  had extensive work up and evaluation by GI  - frequent upper/lower GIB  - requires frequent blood transfusions, last transfusion 10/12               Plan for today:                > monitor    5. Hypothyroidism  > Continue Synthroid  ???  6. HLD  > Continue PTA statin  ???  7. HTN  > Continue metoprolol  ???  8. DMII   - PTA regimen: NPH 18u QD, Novolog 4u TID w/ meals  > Continue PTA NPH, add on mid dose correction factor  ???  Fluids, electrolytes and Nutrition:  IVF: no IVF  Electrolytes: Monitor and replace PRN.  Diet:  Cardiac Diet  ???  Prophylaxis:  DVT: SCD  Stress ulcer: PPI  ???  Code status: Full Code  Disposition: contiue on CV1  ???  Patient discussed with Dr. Duane Lope.    Laban Emperor, MD  Internal Medicine PGY-2   Pager: [phone]    ________________________________________________________________________    Subjective    Having pain/discomfort from her chest tube site. No fevers, chills, nausea, vomiting, palpitation, chest pain, constipation, diarrhea. We discussed how her chest tube is still draining some fluid and that CTS is managing this tube. We also discussed the results of her EGD and the GI perspective moving forward.     Medications  Scheduled Meds:    aspirin chewable tablet 81 mg 81 mg Oral QDAY   cetirizine (ZYRTEC) tablet 10 mg 10 mg Oral QAM8   diltiazem (cardIZEM) tablet 45 mg 45 mg Oral Q6H   doxycycline (VIBRAMYCIN) tablet 100 mg 100 mg Oral BID   folic acid (FOLVITE) tablet 1 mg 1 mg Oral QDAY   heparin (porcine) PF syringe 5,000 Units 5,000 Units Subcutaneous Q8H   insulin aspart U-100 (NOVOLOG FLEXPEN) injection PEN 0-14 Units 0-14 Units Subcutaneous ACHS   insulin NPH (HUMULIN N KwikPen) injection PEN 18 Units 18 Units Subcutaneous QDAY(07)   levothyroxine (SYNTHROID) tablet 175 mcg 175 mcg Oral QDAY 30 min before breakfast   metoprolol tartrate (LOPRESSOR) tablet 100 mg 100 mg Oral BID   nystatin (NYSTOP) topical powder  Topical BID   pantoprazole DR (PROTONIX) tablet 80 mg 80 mg Oral QDAY(21)   rosuvastatin (CRESTOR) tablet 40 mg 40 mg Oral QDAY   senna/docusate (SENOKOT-S) tablet 2 tablet 2 tablet Oral BID   sucralfate (CARAFATE) tablet 1 g 1 g Oral QID   Continuous Infusions:  ??? lactated ringers infusion     ??? sodium chloride 0.9 %   infusion       PRN and Respiratory Meds:acetaminophen Q6H PRN, fentaNYL citrate PF Intra-procedure Med, hydrOXYzine TID PRN, midazolam Intra-procedure Med, nitroglycerin Q5 MIN PRN, ondansetron Q8H PRN, traMADol Q6H PRN      Review of Systems:  A 14 point review of systems was negative except for: Discussed in subjective    Objective:                          Vital Signs: Last Filed                 Vital Signs: 24 Hour Range   BP: 109/46 (10/16 1302)  Temp: 36.5 ???C (97.7 ???F) (10/16 1300)  Pulse: 56 (10/16 1302)  Respirations: 20 PER MINUTE (10/16 1300)  SpO2: 93 % (10/16 1302)  O2 Delivery: Nasal Cannula (10/16 1302) BP: (89-136)/(42-80)  Temp:  [36.3 ???C (97.4 ???F)-37.2 ???C (98.9 ???F)]   Pulse:  [56-111]   Respirations:  [16 PER MINUTE-20 PER MINUTE]   SpO2:  [88 %-100 %]   O2 Delivery: Nasal Cannula   Intensity Pain Scale (Self Report): 5 (03/18/17 1250) Verbal Pain Description: Mild Pain (03/18/17 0333) Vitals:    03/17/17 0446 03/17/17 1044 03/18/17 0750   Weight: 112.3 kg (247 lb 9.6 oz) 113.8 kg (250 lb 14.1 oz) 112.6 kg (248 lb 3.2 oz)       Intake/Output Summary:  (Last 24 hours)    Intake/Output Summary (Last 24 hours) at 03/18/17 1307  Last data filed at 03/18/17 1305   Gross per 24 hour   Intake              750 ml   Output              462 ml   Net              288 ml      Stool Occurrence: 1    Physical Exam  General:  Alert, cooperative, no distress, appears stated age, uncomfortable  Eyes:  Conjunctivae/corneas clear.  EOMI bilaterally, no conjunctival injection  Lungs:  Clear to auscultation bilaterally decreased breath sounds on the left  Heart:   Regular rhythm, tachycardic, S1, S2 normal, no appreciable murmur  Abdomen:  Soft, non-tender.  Bowel sounds normal.  No masses.  No organomegaly.  Extremities: Bilateral lower extremities.  1+ pitting edema bilaterally, mildly worse on the left side, there is a scar assumably where the vein graft was harvested, erythema improved, without any purulence noted  Skin: Scar on left leg from saphenous vein graft without purulence, there is erythema, warmth is equal bilaterally  Neurologic:   CNII - XII intact.  Normal strength, sensation and reflexes throughout.  Psych:   A and O x4 normal mood and affect    Lab Review  24-hour labs:    Results for orders placed or performed during the hospital encounter of 03/13/17 (from the past 24 hour(s))   CREATININE-URINE RANDOM    Collection Time: 03/17/17  4:02 PM   Result Value Ref Range    Creatinine, Random 194 MG/DL   SODIUM-URINE RANDOM    Collection Time: 03/17/17  4:02 PM   Result Value Ref Range    Sodium, Random 12 MMOL/L   UREA NITROGEN-URINE RANDOM    Collection Time: 03/17/17  4:02 PM   Result Value Ref Range    Urea Nitrogen 247 MG/DL   OSMOLALITY-URINE RANDOM    Collection Time: 03/17/17  4:02 PM   Result Value Ref Range Osmolality-Urine 342 50 - 1,400 MOS/KG   POC GLUCOSE    Collection Time: 03/17/17  5:07 PM   Result Value Ref Range    Glucose, POC 207 (H) 70 - 100 MG/DL   POC GLUCOSE    Collection Time: 03/17/17  8:42 PM   Result Value Ref Range    Glucose, POC 173 (H) 70 - 100 MG/DL   CBC AND DIFF    Collection Time: 03/18/17  4:10 AM   Result Value Ref Range    White Blood Cells 7.0 4.5 - 11.0 K/UL    RBC 2.67 (L) 4.0 - 5.0 M/UL    Hemoglobin 8.4 (L) 12.0 - 15.0 GM/DL    Hematocrit 16.1 (L) 36 - 45 %    MCV 98.5 80 - 100 FL    MCH 31.6 26 - 34 PG  MCHC 32.0 32.0 - 36.0 G/DL    RDW 16.1 (H) 11 - 15 %    Platelet Count 167 150 - 400 K/UL    MPV 8.2 7 - 11 FL    Neutrophils 75 41 - 77 %    Lymphocytes 12 (L) 24 - 44 %    Monocytes 11 4 - 12 %    Eosinophils 2 0 - 5 %    Basophils 0 0 - 2 %    Absolute Neutrophil Count 5.30 1.8 - 7.0 K/UL    Absolute Lymph Count 0.80 (L) 1.0 - 4.8 K/UL    Absolute Monocyte Count 0.70 0 - 0.80 K/UL    Absolute Eosinophil Count 0.10 0 - 0.45 K/UL    Absolute Basophil Count 0.00 0 - 0.20 K/UL   COMPREHENSIVE METABOLIC PANEL    Collection Time: 03/18/17  4:10 AM   Result Value Ref Range    Sodium 133 (L) 137 - 147 MMOL/L    Potassium 4.4 3.5 - 5.1 MMOL/L    Chloride 100 98 - 110 MMOL/L    Glucose 207 (H) 70 - 100 MG/DL    Blood Urea Nitrogen 40 (H) 7 - 25 MG/DL    Creatinine 0.96 (H) 0.4 - 1.00 MG/DL    Calcium 9.2 8.5 - 04.5 MG/DL    Total Protein 7.2 6.0 - 8.0 G/DL    Total Bilirubin 0.8 0.3 - 1.2 MG/DL    Albumin 3.2 (L) 3.5 - 5.0 G/DL    Alk Phosphatase 62 25 - 110 U/L    AST (SGOT) 10 7 - 40 U/L    CO2 25 21 - 30 MMOL/L    ALT (SGPT) 4 (L) 7 - 56 U/L    Anion Gap 8 3 - 12    eGFR Non African American 17 (L) >60 mL/min    eGFR African American 21 (L) >60 mL/min   POC GLUCOSE    Collection Time: 03/18/17  9:02 AM   Result Value Ref Range    Glucose, POC 195 (H) 70 - 100 MG/DL   URINALYSIS DIPSTICK REFLEX TO CULTURE    Collection Time: 03/18/17 12:30 PM   Result Value Ref Range Color,UA AMBER     Turbidity,UA 1+ (A) CLEAR-CLEAR    Specific Gravity-Urine 1.021 1.003 - 1.035    pH,UA 5.0 5.0 - 8.0    Protein,UA 1+ (A) NEG-NEG    Glucose,UA NEG NEG-NEG    Ketones,UA NEG NEG-NEG    Bilirubin,UA NEG NEG-NEG    Blood,UA NEG NEG-NEG    Urobilinogen,UA NORMAL NORM-NORMAL    Nitrite,UA NEG NEG-NEG    Leukocytes,UA NEG NEG-NEG    Urine Ascorbic Acid, UA NEG NEG-NEG   URINALYSIS MICROSCOPIC REFLEX TO CULTURE    Collection Time: 03/18/17 12:30 PM   Result Value Ref Range    WBCs,UA 2-10 0 - 2 /HPF    RBCs,UA 0-2 0 - 3 /HPF    Comment,UA       Urine submitted for reflex culture if criteria are met:WBC>10, positive nitrite   and/or >=1+ leukocyte esterase. If quantity is not sufficient, an addendum will   follow.      MucousUA TRACE     Bacteria,UA FEW (A) NEG-NEG    Squamous Epithelial Cells 10-20 0 - 5    Hyaline Cast PACKED     Renal Epitheilial 0-2     Transitional Epithelial 0-2        Point of Care Testing  (Last 24 hours)  Glucose: (!) 207 (03/18/17 0410)  POC Glucose (Download): (!) 195 (03/18/17 0902)    Radiology and other Diagnostics Review:    None

## 2017-03-19 ENCOUNTER — Encounter: Admit: 2017-03-19 | Discharge: 2017-03-19 | Payer: MEDICARE

## 2017-03-19 DIAGNOSIS — E039 Hypothyroidism, unspecified: ICD-10-CM

## 2017-03-19 DIAGNOSIS — M549 Dorsalgia, unspecified: ICD-10-CM

## 2017-03-19 DIAGNOSIS — D699 Hemorrhagic condition, unspecified: ICD-10-CM

## 2017-03-19 DIAGNOSIS — I4891 Unspecified atrial fibrillation: ICD-10-CM

## 2017-03-19 DIAGNOSIS — I5032 Chronic diastolic (congestive) heart failure: ICD-10-CM

## 2017-03-19 DIAGNOSIS — E119 Type 2 diabetes mellitus without complications: ICD-10-CM

## 2017-03-19 DIAGNOSIS — I10 Essential (primary) hypertension: Secondary | ICD-10-CM

## 2017-03-19 DIAGNOSIS — M1990 Unspecified osteoarthritis, unspecified site: ICD-10-CM

## 2017-03-19 DIAGNOSIS — I2510 Atherosclerotic heart disease of native coronary artery without angina pectoris: Principal | ICD-10-CM

## 2017-03-19 DIAGNOSIS — K319 Disease of stomach and duodenum, unspecified: ICD-10-CM

## 2017-03-19 DIAGNOSIS — H547 Unspecified visual loss: ICD-10-CM

## 2017-03-19 LAB — BLOOD GASES, ARTERIAL
Lab: 2.2 MMOL/L
Lab: 22 MMOL/L (ref 21–28)
Lab: 50 mmHg — ABNORMAL HIGH (ref 35–45)
Lab: 76 mmHg — ABNORMAL LOW (ref 80–100)
Lab: 7.3 — ABNORMAL LOW (ref 7.35–7.45)

## 2017-03-19 LAB — COMPREHENSIVE METABOLIC PANEL
Lab: 133 MMOL/L — ABNORMAL LOW (ref 137–147)
Lab: 0.7 mg/dL (ref 0.3–1.2)
Lab: 131 MMOL/L — ABNORMAL LOW (ref 137–147)
Lab: 14 U/L (ref 7–40)
Lab: 142 mg/dL — ABNORMAL HIGH (ref 70–100)
Lab: 15 mL/min — ABNORMAL LOW (ref >60)
Lab: 19 mL/min — ABNORMAL LOW (ref >60)
Lab: 24 MMOL/L (ref 21–30)
Lab: 3.1 mg/dL — ABNORMAL HIGH (ref 0.4–1.00)
Lab: 3.5 g/dL (ref 3.5–5.0)
Lab: 4.4 MMOL/L (ref 3.5–5.1)
Lab: 48 mg/dL — ABNORMAL HIGH (ref 7–25)
Lab: 6 U/L — ABNORMAL LOW (ref 7–56)
Lab: 60 U/L (ref 25–110)
Lab: 7.6 g/dL (ref 6.0–8.0)
Lab: 8 10*3/uL (ref 3–12)
Lab: 9.6 mg/dL (ref 8.5–10.6)
Lab: 99 MMOL/L — AB (ref 98–110)

## 2017-03-19 LAB — CULTURE-BLOOD W/SENSITIVITY

## 2017-03-19 LAB — POC GLUCOSE
Lab: 162 mg/dL — ABNORMAL HIGH (ref 70–100)
Lab: 141 mg/dL — ABNORMAL HIGH (ref 70–100)
Lab: 137 mg/dL — ABNORMAL HIGH (ref 70–100)
Lab: 173 mg/dL — ABNORMAL HIGH (ref 70–100)

## 2017-03-19 LAB — CBC AND DIFF: Lab: 10 10*3/uL — ABNORMAL LOW (ref 4.5–11.0)

## 2017-03-19 MED ORDER — FUROSEMIDE 10 MG/ML IJ SOLN
40 mg | Freq: Once | INTRAVENOUS | 0 refills | Status: CP
Start: 2017-03-19 — End: ?
  Administered 2017-03-20: 06:00:00 40 mg via INTRAVENOUS

## 2017-03-19 MED ORDER — LORAZEPAM 2 MG/ML IJ SOLN
.5 mg | Freq: Once | INTRAVENOUS | 0 refills | Status: CP
Start: 2017-03-19 — End: ?
  Administered 2017-03-20: 01:00:00 0.5 mg via INTRAVENOUS

## 2017-03-19 MED ORDER — PERFLUTREN LIPID MICROSPHERES 1.1 MG/ML IV SUSP
1-20 mL | Freq: Once | INTRAVENOUS | 0 refills | Status: CP
Start: 2017-03-19 — End: ?
  Administered 2017-03-19: 17:00:00 1.5 mL via INTRAVENOUS

## 2017-03-19 MED ORDER — FUROSEMIDE 10 MG/ML IJ SOLN
40 mg | Freq: Once | INTRAVENOUS | 0 refills | Status: CP
Start: 2017-03-19 — End: ?
  Administered 2017-03-19: 20:00:00 40 mg via INTRAVENOUS

## 2017-03-19 MED ORDER — FUROSEMIDE 10 MG/ML IJ SOLN
40 mg | Freq: Once | INTRAVENOUS | 0 refills | Status: CP
Start: 2017-03-19 — End: ?
  Administered 2017-03-19: 14:00:00 40 mg via INTRAVENOUS

## 2017-03-19 MED ORDER — CHLOROTHIAZIDE SODIUM 500 MG IV SOLR
250 mg | Freq: Once | INTRAVENOUS | 0 refills | Status: CP
Start: 2017-03-19 — End: ?
  Administered 2017-03-19: 23:00:00 250 mg via INTRAVENOUS

## 2017-03-19 MED ORDER — CHLOROTHIAZIDE SODIUM 500 MG IV SOLR
250 mg | Freq: Once | INTRAVENOUS | 0 refills | Status: CP
Start: 2017-03-19 — End: ?
  Administered 2017-03-20: 07:00:00 250 mg via INTRAVENOUS

## 2017-03-19 NOTE — Progress Notes
Patient was anxious during start of shift regarding chest tube and wanting it to be removed. Patient was given one time dose of Ativan. During the night patient's oxygen demand has increased and her oxygen saturation has been consistently around 87-90% on 3-4L of oxygen. Patient has be oriented to herself and being in a hosptial (believes she is in a hospital in [place]) and what year it is. Currently patient has become much more lethargic.Labs drawn and CV1 notified and ABG and chest x-ray ordered. Will continue to monitor.

## 2017-03-19 NOTE — Case Management (ED)
Case Management Progress Note    NAME:Diane Savage                          MRN: [id number]              DOB:June 27, 1955          AGE: 61 y.o.  ADMISSION DATE: 03/13/2017             DAYS ADMITTED: LOS: 6 days      Todays Date: 03/19/2017    Plan  *possible discharge 03/21/17  *chest tube pulled today  *start anticoagulation tomorrow  *TEE and cardioversion 03/21/17    Interventions  ? Support   Support: Pt/Family Updates re:POC or DC Plan, Patient Education  ? Info or Referral      ? Discharge Planning      ? Medication Needs   Medication Needs: Medication Assistance Resources in the Community    *PT/OT recommending " Home with family assist, Home with Home Health"   *Patient advised that since she has no health insurance this RN CM was unable to arrange home health. Patient and spouse stated they will be fine. Patient stated that her husband and daughter Sharee Pimple are available to help her at home.    *This RN CM discussed the patient not having home health with the SW CM Christina who did the patient's assessment. Margreta Journey agreed that she felt the patient's family would be supportive.     ? Financial      ? Legal      ? Other        Disposition  ? Expected Discharge Date    Expected Discharge Date: 03/22/17  ? Transportation   Does the patient need discharge transport arranged?: No  Transportation Name, Phone and Availability #1: [facility] (husband) [phone]  Does the patient use Medicaid Transportation?: No      ? Discharge Disposition           Durable Medical Equipment     No service has been selected for the patient.      [place] Destination     No service has been selected for the patient.      [place]     No service has been selected for the patient.      [facility]     No service has been selected for the patient.        Joetta Manners RN, BSN, MSN  Integrated Nursing Case Manager  Office [phone] M-F 8-5 pm

## 2017-03-19 NOTE — Progress Notes
PHYSICAL THERAPY  NOTE       Patient adamantly declined all mobility stating having knee pain and buckling when up yesterday. Patient with decreased arousal during conversation and unable to provide reason for not participating with repeatedly stating "no" despite open ended questions provided. RN aware of attempt. Patient declines any SOA or needs at this time prior to exiting room.  Physical therapy will continue to follow and provide intervention as indicated.      Therapist: Eleonore Chiquito  Date: 03/19/2017

## 2017-03-19 NOTE — Progress Notes
CTS update note:    Diane Savage is s/p CABG/MAZE/ ligation LAA on Dr. Alfonso Ramus on 02/04/17 admitted 03/13/17 with atrial fibrillation and left pleural effusion.     GI consulted for anticoagulation recommendations as she has h/o GI bleeds. 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     VS: afebrile, A fib/Aflutter rate 70-80s, BP 100-120s, 93% on 4L    On diltiazem 45mg  q6hrs    Labs:  Hgb 8.5-->7.9-->8.8-->8.7-->8.4-->8.9  Cr 1.6-->1.49-->2.05-->2.45-->2.80-->3.06, diuresis held    L pigtail placed in IR 10/12. CXR today with continued L effusion. Pleural CT with 40cc/24hr out. Atelectasis in left chest, basilar.     Plan:  1. D/c pigtail this afternoon  2. Atelectasis on CXR left chest. IS, respiratory therapy on board

## 2017-03-19 NOTE — Consults
Renal Consult    Diane Savage  Admission Date:  03/13/2017         Assessment and Plan     Principal Problem:    Atrial fibrillation with rapid ventricular response (HCC)  Active Problems:    Type 2 diabetes mellitus with circulatory disorder, without long-term current use of insulin (HCC)    PAF (paroxysmal atrial fibrillation) (HCC)    Chronic gastrointestinal bleeding    Transfusion-dependent anemia    CAD (coronary artery disease), native coronary artery    HLD (hyperlipidemia)    Hypothyroidism    AKI (acute kidney injury) (HCC)    Essential hypertension    Pleural effusion on left    Left leg cellulitis    Barrett's esophagus    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 female s/p CABG admitted for dyspnea and volume overload.    AKI  -no significant CKD  -renal ultrasound without hydronephrosis  -no recent nephrotoxins  -creatinine progressively increasing  -I&O appear inaccurate  -FeUrea 8% suggesting renal hypoperfusion    Atrial flutter  Leukocytosis  CAD s/p CABG  Anemia  HTN  Hyponatremia, hypervolemic    Recommendations:  -agree that patient appears volume overloaded  -repeat TTE without significant change in cardiac function  -increase IV diuretics, perhaps 80 mg furosemide IV BID with chlorothiazide 250 mg  -it is unclear what has caused her clinical decline; would send blood cultures, urine culture  -no indication for hemodialysis at this time  -will review urine sediment when available  -strict I&O, standing daily wieght  -patient seen with Dr. Gretchen   -please call with questions or concerns    [name], MD  PGY-5 Nephrology Fellow  Pager [phone]      History     Reason for Consult: AKI    HPI: Diane Savage is a 61 y.o. female with CAD s/p CABG 94/18, T2Dm, HTN, hypothyroidism, a-fib who was admitted for progressive dyspnea and edema.  She had been started on IV diuretics, but her response has been suboptimal and her creatinine continues to increase.  There has been no recent nephrotoxin exposure or hemodynamic insult. She is in mild respiratory distress at the time of examination and is able to answer simple questions, but is not a detailed historian.  Her husband was present at the time of consultation.  All questions were answered.      Past Medical History   Past Medical History:   Diagnosis Date   ??? Acquired hypothyroidism    ??? Arthritis    ??? Back pain    ??? Bleeding disorder (HCC)    ??? CAD (coronary artery disease), native coronary artery 02/04/2017   ??? Chronic diastolic heart failure (HCC) 03/19/2017   ??? Coronary artery disease    ??? Diabetes mellitus (HCC)     Type II   ??? Essential hypertension 02/11/2017   ??? Hypertension    ??? Stomach disorder    ??? Vision decreased        Past Surgical History:   Procedure Laterality Date   ??? HX HEART CATHETERIZATION  2017   ??? CORONARY STENT PLACEMENT  2017   ??? CORONARY ARTERY BYPASS GRAFT N/A 02/04/2017    CORONARY ARTERY BYPASS WITH ARTERIAL GRAFT - 4 GRAFTS (Internal Mammary Artery and Endovascular Vein Harvest) performed by Collene Schlichter, MD at [facility]   ??? HX MAZE  N/A 02/04/2017    MAZE PROCEDURE performed by Collene Schlichter, MD at [facility]   ??? UPPER GASTROINTESTINAL ENDOSCOPY N/A 03/17/2017    ESOPHAGOGASTRODUODENOSCOPY performed by Jolee Ewing, MD at [facility]   ??? ANKLE SURGERY      Left ankle reconstruction   ??? COLONOSCOPY     ??? HX KNEE ARTHROSCOPY Left    ??? HX TONSIL AND ADENOIDECTOMY     ??? TUBAL LIGATION     ??? UPPER GASTROINTESTINAL ENDOSCOPY         Family History  Family history reviewed; non-contributory    Social History   Social History     Social History   ??? Marital status: Married     Spouse name: N/A   ??? Number of children: N/A   ??? Years of education: N/A     Social History Main Topics   ??? Smoking status: Former Smoker     Packs/day: 2.00     Years: 10.00     Types: Cigarettes Quit date: 09/16/1980   ??? Smokeless tobacco: Never Used   ??? Alcohol use Yes      Comment: Seldom   ??? Drug use: No   ??? Sexual activity: Not on file     Other Topics Concern   ??? Not on file     Social History Narrative   ??? No narrative on file          Medications  MEDS  aspirin 81 mg Oral QDAY   cetirizine 10 mg Oral QAM8   diltiazem 45 mg Oral Q6H   doxycycline 100 mg Oral BID   folic acid 1 mg Oral QDAY   heparin (porcine) 5,000 Units Subcutaneous Q8H   insulin aspart U-100 0-14 Units Subcutaneous ACHS   insulin NPH 18 Units Subcutaneous QDAY(07)   levothyroxine 175 mcg Oral QDAY 30 min before breakfast   metoprolol tartrate 100 mg Oral BID   nystatin  Topical BID   pantoprazole DR 80 mg Oral QDAY(21)   rosuvastatin 40 mg Oral QDAY   senna/docusate 2 tablet Oral BID   sucralfate 1 g Oral QID    IV MEDS  Prn acetaminophen Q6H PRN 650 mg at 03/18/17 1246, fentaNYL citrate PF Intra-procedure Med 25 mcg at 03/14/17 1215, hydrOXYzine TID PRN 25 mg at 03/18/17 1246, midazolam Intra-procedure Med 1 mg at 03/14/17 1213, nitroglycerin Q5 MIN PRN, ondansetron Q8H PRN 8 mg at 03/17/17 0956, traMADol Q6H PRN 50 mg at 03/18/17 2114     HOME MEDS  Prior to Admission Medications   Prescriptions Last Dose Informant Patient Reported? Taking?   GLUCOSAMINE HCL/CHONDROITIN SU (GLUCOSAMINE-CHONDROITIN PO) 03/13/2017 Self Yes Yes   Sig: Take 1 tablet by mouth twice daily.   Insulin Syringe-Needle U-100 (BD INSULIN SYRINGE ULTRA-FINE) 0.3 mL 31 gauge x 5/16 syrg  Self No No   Sig: Use four times daily with Insulin   acetaminophen (TYLENOL) 325 mg tablet 03/13/2017 Self No Yes   Sig: Take two tablets by mouth every 6 hours as needed.   aspirin 81 mg chewable tablet 03/13/2017 Self No Yes   Sig: Chew one tablet by mouth daily with food.   cetirizine (ZYRTEC) 10 mg tablet 03/13/2017 Self Yes Yes   Sig: Take 10 mg by mouth every morning.   clobetasol (TEMOVATE) 0.05 % topical cream Past Week Self Yes Yes Sig: Apply to affected area twice daily as needed   diphenhydrAMINE (BENADRYL ALLERGY) 25 mg tablet 03/12/2017 Self Yes Yes   Sig: Take 25  mg by mouth at bedtime as needed.   folic acid (FOLVITE) 1 mg tablet 03/13/2017 Self Yes Yes   Sig: Take 1 mg by mouth daily.   furosemide (LASIX) 20 mg tablet 03/12/2017 Self Yes Yes   Sig: Take 20 mg by mouth at bedtime daily.   insulin NPH (HUMULIN N NPH U-100 INSULIN) 100 unit/mL injection 03/12/2017 Self No Yes   Sig: Inject fourteen Units under the skin every morning.   insulin aspart U-100 (NOVOLOG) 100 unit/mL injection 03/12/2017 Self No Yes   Sig: Inject four Units under the skin three times daily with meals.   levothyroxine (SYNTHROID) 175 mcg tablet 03/13/2017 Self Yes Yes   Sig: Take 175 mcg by mouth daily 30 minutes before breakfast.   metoprolol tartrate (LOPRESSOR) 100 mg tablet 03/13/2017 Self Yes Yes   Sig: Take 100 mg by mouth twice daily.   nitroglycerin (NITROSTAT) 0.4 mg tablet >1 Month Self Yes Yes   Sig: Place 0.4 mg under tongue every 5 minutes as needed for Chest Pain. Max of 3 tablets, call 911.   omeprazole DR(+) (PRILOSEC) 40 mg capsule 03/13/2017 Self Yes Yes   Sig: Take 40 mg by mouth twice daily.   ondansetron (ZOFRAN) 8 mg tablet Past Week Self Yes Yes   Sig: Take 8 mg by mouth every 8 hours as needed for Nausea or Vomiting.   senna/docusate (SENOKOT-S) 8.6/50 mg tablet 03/12/2017 Self No Yes   Sig: Take two tablets by mouth twice daily.   Patient taking differently: Take 2 tablets by mouth at bedtime daily.   tiZANidine (ZANAFLEX) 4 mg tablet 03/12/2017 Outside Pharmacy Yes Yes   Sig: Take 4 mg by mouth every 6 hours as needed.   traMADol (ULTRAM) 50 mg tablet 03/13/2017 Outside Pharmacy No Yes   Sig: Take one tablet by mouth every 6 hours as needed for Pain.      Facility-Administered Medications: None          Review of Systems  Constitutional: negative  Eyes: negative  Ears, nose, mouth, throat, and face: negative  Respiratory: negative Cardiovascular: negative  Gastrointestinal: negative  Genitourinary:negative  Integument/breast: negative  Hematologic/lymphatic: negative  Musculoskeletal:negative  Neurological: negative  Endocrine: negative      Physical Exam        Vital Signs: Last Filed In 24 Hours Vital Signs: 24 Hour Range   BP: 122/47 (10/17 1642)  Temp: 36.6 ???C (97.9 ???F) (10/17 1642)  Pulse: 74 (10/17 1642)  Respirations: 18 PER MINUTE (10/17 1642)  SpO2: 96 % (10/17 1642)  O2 Delivery: Nasal Cannula (10/17 1642)  Height: 162.6 cm (64) (10/17 1157) BP: (106-130)/(40-60)   Temp:  [36.4 ???C (97.6 ???F)-36.7 ???C (98.1 ???F)]   Pulse:  [74-89]   Respirations:  [16 PER MINUTE-18 PER MINUTE]   SpO2:  [91 %-100 %]   O2 Delivery: Nasal Cannula   Intensity Pain Scale (Self Report): 7 (03/18/17 2013)      Vitals:    03/18/17 0750 03/19/17 0500 03/19/17 1157   Weight: 112.6 kg (248 lb 3.2 oz) 113 kg (249 lb 1.9 oz) 112.9 kg (249 lb)       Intake/Output Summary (Last 24 hours) at 03/19/17 1845  Last data filed at 03/19/17 1640   Gross per 24 hour   Intake              500 ml   Output              380 ml  Net              120 ml        Gen: drowsy, mild respiratory distress   HEENT: Sclera normal; MMM   CV:+ JVD, regular rhythm  Pulm: Clear to Auscultation bilateral, diminished  GI: BS+ x4, non-tender to palpation  Neuro: drowsy, oriented  Ext: moderate ble edema, no clubbing or cyanosis   Skin: no rash, dry      Labs     Recent Labs      03/17/17   0500  03/18/17   0410  03/19/17   0350  03/19/17   1535   NA  132*  133*  133*  131*   K  4.7  4.4  4.3  4.4   CL  100  100  99  99   CO2  25  25  23  24    GAP  7  8  11  8    BUN  34*  40*  48*  48*   CR  2.45*  2.80*  3.06*  3.10*   GLU  251*  207*  149*  142*   CA  9.3  9.2  9.3  9.6   ALBUMIN  3.2*  3.2*  3.4*  3.5       Recent Labs      03/17/17   0500  03/18/17   0410  03/19/17   0350  03/19/17   1535   WBC  7.6  7.0  10.9   --    HGB  8.7*  8.4*  8.9*   --    HCT  26.5*  26.3*  27.4*   -- PLTCT  194  167  200   --    AST  27  10  14  14    ALT  7  4*  5*  6*   ALKPHOS  66  62  59  60      Estimated Creatinine Clearance: 23.8 mL/min (A) (based on SCr of 3.1 mg/dL (H)).  Vitals:    03/18/17 0750 03/19/17 0500 03/19/17 1157   Weight: 112.6 kg (248 lb 3.2 oz) 113 kg (249 lb 1.9 oz) 112.9 kg (249 lb)    Recent Labs      03/19/17   0445   PHART  7.30*   PO2ART  76*                           Radiology     Pertinent radiology reviewed.

## 2017-03-19 NOTE — Progress Notes
General Progress Note    Name:  Diane Savage   ZOXWR'[id number] Date:  03/19/2017  Admission Date: 03/13/2017  LOS: 6 days                     Assessment/Plan:    Principal Problem:    Atrial fibrillation with rapid ventricular response (HCC)  Active Problems:    Type 2 diabetes mellitus with circulatory disorder, without long-term current use of insulin (HCC)    PAF (paroxysmal atrial fibrillation) (HCC)    Chronic gastrointestinal bleeding    Transfusion-dependent anemia    CAD (coronary artery disease), native coronary artery    HLD (hyperlipidemia)    Hypothyroidism    AKI (acute kidney injury) (HCC)    Essential hypertension    Pleural effusion on left    Left leg cellulitis    Barrett's esophagus    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 female, the patient has a past medical history of Acquired hypothyroidism; Arthritis; Back pain; Bleeding disorder (HCC); Coronary artery disease; Diabetes mellitus (HCC); Hypertension; Stomach disorder; and Vision decreased. presenting with shortness of breath, dizziness/lightheadedness, chills, nausea, lower extremity swelling and palpitations.  ???  1. 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  2. Shortness of Breath/Lower extremity swelling, concern for DVT, volume overload  - LLE swelling greater than right since surgery  - has history of DVT  - BNP 327 on admission   - CXR with persistent left sided pleural effusion   - IR drain and Chest tube on left side, 40 cc overnight  -CTS okay with anticoagulation while chest tube is in place  -Chest x-ray worse overnight showing increased pulmonary vascular congestion  -Net positive 300 cc over last 24 hours  Plan for today:   > Pulling chest tube today   > 40 IV Lasix in a.m., repeat around noon   > Repeat 2D echo pending, concern for increased pericardial effusion   > low threshold for BiPAP    3.  Acute kidney injury  -Likely combination of prerenal and intrinsic  -Consistent with prerenal disease  -Renal ultrasound unremarkable: Urine output, oliguria  -Changed amoxicillin to doxycycline  -Held Lasix for 2 days but creatinine still rose  -Urine output, oliguria  Plan:   > Avoid nephrotoxic  medications   > Nephrology consult    4.  Cellulitis of lower extremity, left  - Amoxicillin started on 10/12, end 10/15  Plan:  >continue doxycycline, end 03/23/17  ???  5. 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  6. 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 Hypothyroidism  > Continue Synthroid  ???  8. HLD  > Continue PTA statin  ???  9. HTN  > Continue metoprolol  ???  10. DMII   - PTA regimen: NPH 18u QD, Novolog 4u TID w/ meals  > Continue PTA NPH, add on mid dose correction factor  ???  Fluids, electrolytes and Nutrition:  IVF: no IVF  Electrolytes: Monitor and replace PRN.  Diet:  Cardiac Diet  ???  Prophylaxis:  DVT: SCD  Stress ulcer: PPI  ???  Code status: Full Code  Disposition: contiue on CV1  ???  Patient discussed with Dr. Duane Lope.    Ricard Dillon DO  Internal Medicine PGY-1   Pager: [phone]    ________________________________________________________________________    Subjective    Patient seen and examined this morning.  Overnight had increased oxygen requirements up to 4 L, O2 sats in high 80s to 92%.  ABG consistent with CO2 retention.  Chest x-ray consistent with increased pulmonary vascular congestion.  Also noted to have worsening creatinine.  Upon examination this morning patient was confused and complaining of chest pain secondary to chest tube.  Seemed lethargic this morning as well.  Denies fever, chills, n/v, chest tightness, abdominal pain, diarrhea, constipation.    Medications  Scheduled Meds:    aspirin chewable tablet 81 mg 81 mg Oral QDAY   cetirizine (ZYRTEC) tablet 10 mg 10 mg Oral QAM8   diltiazem (cardIZEM) tablet 45 mg 45 mg Oral Q6H   doxycycline (VIBRAMYCIN) tablet 100 mg 100 mg Oral BID   folic acid (FOLVITE) tablet 1 mg 1 mg Oral QDAY   heparin (porcine) PF syringe 5,000 Units 5,000 Units Subcutaneous Q8H   insulin aspart U-100 (NOVOLOG FLEXPEN) injection PEN 0-14 Units 0-14 Units Subcutaneous ACHS   insulin NPH (HUMULIN N KwikPen) injection PEN 18 Units 18 Units Subcutaneous QDAY(07)   levothyroxine (SYNTHROID) tablet 175 mcg 175 mcg Oral QDAY 30 min before breakfast metoprolol tartrate (LOPRESSOR) tablet 100 mg 100 mg Oral BID   nystatin (NYSTOP) topical powder  Topical BID   pantoprazole DR (PROTONIX) tablet 80 mg 80 mg Oral QDAY(21)   rosuvastatin (CRESTOR) tablet 40 mg 40 mg Oral QDAY   senna/docusate (SENOKOT-S) tablet 2 tablet  2 tablet Oral BID   sucralfate (CARAFATE) tablet 1 g 1 g Oral QID   Continuous Infusions:    PRN and Respiratory Meds:acetaminophen Q6H PRN, fentaNYL citrate PF Intra-procedure Med, hydrOXYzine TID PRN, midazolam Intra-procedure Med, nitroglycerin Q5 MIN PRN, ondansetron Q8H PRN, traMADol Q6H PRN      Review of Systems:  A 14 point review of systems was negative except for: Discussed in subjective    Objective:                          Vital Signs: Last Filed                 Vital Signs: 24 Hour Range   BP: 127/60 (10/17 1230)  Temp: 36.6 ???C (97.9 ???F) (10/17 1230)  Pulse: 75 (10/17 1230)  Respirations: 18 PER MINUTE (10/17 1230)  SpO2: 97 % (10/17 1230)  O2 Delivery: Nasal Cannula (10/17 1230)  Height: 162.6 cm (64) (10/17 1157) BP: (89-130)/(40-61)   Temp:  [36.4 ???C (97.6 ???F)-36.7 ???C (98.1 ???F)]   Pulse:  [56-89]   Respirations:  [16 PER MINUTE-20 PER MINUTE]   SpO2:  [86 %-100 %]   O2 Delivery: Nasal Cannula   Intensity Pain Scale (Self Report): 7 (03/18/17 2013) Vitals:    03/18/17 0750 03/19/17 0500 03/19/17 1157   Weight: 112.6 kg (248 lb 3.2 oz) 113 kg (249 lb 1.9 oz) 112.9 kg (249 lb)       Intake/Output Summary:  (Last 24 hours)    Intake/Output Summary (Last 24 hours) at 03/19/17 1258  Last data filed at 03/19/17 1231   Gross per 24 hour   Intake              500 ml   Output              245 ml   Net              255 ml      Stool Occurrence: 1    Physical Exam  General:  Alert, cooperative, no distress, appears stated age, uncomfortable  Eyes:  Conjunctivae/corneas clear.  EOMI bilaterally, no conjunctival injection  Lungs:  Clear to auscultation bilaterally decreased breath sounds on the left Heart:   Regular rhythm, tachycardic, S1, S2 normal, no appreciable murmur  Abdomen:  Soft, non-tender.  Bowel sounds normal.  No masses.  No organomegaly.  Extremities: Bilateral lower extremities.  1+ pitting edema bilaterally, mildly worse on the left side, there is a scar assumably where the vein graft was harvested, erythema improved, without any purulence noted  Skin: Scar on left leg from saphenous vein graft without purulence, there is erythema, warmth is equal bilaterally  Neurologic:   CNII - XII intact.  Normal strength, sensation and reflexes throughout.  Psych:   A and O x3, lethargic, slow to answer questions    Lab Review  24-hour labs:    Results for orders placed or performed during the hospital encounter of 03/13/17 (from the past 24 hour(s))   POC GLUCOSE    Collection Time: 03/18/17  5:25 PM   Result Value Ref Range    Glucose, POC 138 (H) 70 - 100 MG/DL   POC GLUCOSE    Collection Time: 03/18/17  9:31 PM   Result Value Ref Range    Glucose, POC 162 (H) 70 - 100 MG/DL   CBC AND DIFF    Collection Time: 03/19/17  3:50 AM   Result  Value Ref Range    White Blood Cells 10.9 4.5 - 11.0 K/UL    RBC 2.85 (L) 4.0 - 5.0 M/UL    Hemoglobin 8.9 (L) 12.0 - 15.0 GM/DL    Hematocrit 21.3 (L) 36 - 45 %    MCV 96.1 80 - 100 FL    MCH 31.2 26 - 34 PG    MCHC 32.4 32.0 - 36.0 G/DL    RDW 08.6 (H) 11 - 15 %    Platelet Count 200 150 - 400 K/UL    MPV 8.9 7 - 11 FL    Neutrophils 75 41 - 77 %    Lymphocytes 10 (L) 24 - 44 %    Monocytes 11 4 - 12 %    Eosinophils 3 0 - 5 %    Basophils 1 0 - 2 %    Absolute Neutrophil Count 8.20 (H) 1.8 - 7.0 K/UL    Absolute Lymph Count 1.10 1.0 - 4.8 K/UL    Absolute Monocyte Count 1.20 (H) 0 - 0.80 K/UL    Absolute Eosinophil Count 0.30 0 - 0.45 K/UL    Absolute Basophil Count 0.10 0 - 0.20 K/UL   COMPREHENSIVE METABOLIC PANEL    Collection Time: 03/19/17  3:50 AM   Result Value Ref Range    Sodium 133 (L) 137 - 147 MMOL/L    Potassium 4.3 3.5 - 5.1 MMOL/L Chloride 99 98 - 110 MMOL/L    Glucose 149 (H) 70 - 100 MG/DL    Blood Urea Nitrogen 48 (H) 7 - 25 MG/DL    Creatinine 5.78 (H) 0.4 - 1.00 MG/DL    Calcium 9.3 8.5 - 46.9 MG/DL    Total Protein 7.5 6.0 - 8.0 G/DL    Total Bilirubin 0.7 0.3 - 1.2 MG/DL    Albumin 3.4 (L) 3.5 - 5.0 G/DL    Alk Phosphatase 59 25 - 110 U/L    AST (SGOT) 14 7 - 40 U/L    CO2 23 21 - 30 MMOL/L    ALT (SGPT) 5 (L) 7 - 56 U/L    Anion Gap 11 3 - 12    eGFR Non African American 16 (L) >60 mL/min    eGFR African American 19 (L) >60 mL/min   BLOOD GASES, ARTERIAL    Collection Time: 03/19/17  4:45 AM   Result Value Ref Range    pH-Arterial 7.30 (L) 7.35 - 7.45    pCO2-Arterial 50 (H) 35 - 45 MMHG    pO2-Arterial 76 (L) 80 - 100 MMHG    Base Deficit-Arterial 2.2 MMOL/L    O2 Sat-Arterial 94.8 (L) 95 - 99 %    Bicarbonate-ART-Cal 22.5 21 - 28 MMOL/L   POC GLUCOSE    Collection Time: 03/19/17  7:34 AM   Result Value Ref Range    Glucose, POC 141 (H) 70 - 100 MG/DL   POC GLUCOSE    Collection Time: 03/19/17 12:17 PM   Result Value Ref Range    Glucose, POC 137 (H) 70 - 100 MG/DL       Point of Care Testing  (Last 24 hours)  Glucose: (!) 149 (03/19/17 0350)  POC Glucose (Download): (!) 137 (03/19/17 1217)    Radiology and other Diagnostics Review:    None    Ricard Dillon DO  Internal Medicine PGY 1  Pager [phone]

## 2017-03-20 ENCOUNTER — Inpatient Hospital Stay: Admit: 2017-03-20 | Discharge: 2017-03-20 | Payer: MEDICARE

## 2017-03-20 ENCOUNTER — Encounter: Admit: 2017-03-20 | Discharge: 2017-03-20 | Payer: MEDICARE

## 2017-03-20 LAB — CBC AND DIFF
Lab: 0.1 10*3/uL (ref 0–0.20)
Lab: 0.3 10*3/uL (ref 0–0.45)
Lab: 0.9 10*3/uL — ABNORMAL HIGH (ref 0–0.80)
Lab: 1 % (ref 0–2)
Lab: 1 10*3/uL (ref 1.0–4.8)
Lab: 12 % (ref 4–12)
Lab: 14 % — ABNORMAL LOW (ref 24–44)
Lab: 196 10*3/uL — ABNORMAL LOW (ref >60)
Lab: 4.9 10*3/uL (ref 1.8–7.0)
Lab: 5 % (ref 0–5)
Lab: 68 % (ref 41–77)
Lab: 7.2 K/UL (ref 4.5–11.0)
Lab: 8.2 FL — ABNORMAL LOW (ref >60)

## 2017-03-20 LAB — SODIUM-URINE RANDOM: Lab: 11 MMOL/L (ref 5.0–8.0)

## 2017-03-20 LAB — UREA NITROGEN-URINE RANDOM: Lab: 294 mg/dL

## 2017-03-20 LAB — CREATININE-URINE RANDOM: Lab: 209 mg/dL

## 2017-03-20 LAB — BLOOD GASES, ARTERIAL
Lab: 0.8 MMOL/L
Lab: 7.2 — ABNORMAL LOW (ref 7.35–7.45)
Lab: 76 mmHg — ABNORMAL LOW (ref 80–100)

## 2017-03-20 LAB — URINALYSIS DIPSTICK REFLEX TO CULTURE
Lab: NEGATIVE
Lab: NEGATIVE
Lab: NEGATIVE
Lab: NEGATIVE

## 2017-03-20 LAB — BLOOD GASES, PERIPHERAL VENOUS
Lab: 0.9 MMOL/L — ABNORMAL HIGH (ref 26–34)
Lab: 44 mmHg — ABNORMAL HIGH (ref 33–48)
Lab: 65 mmHg — ABNORMAL HIGH (ref 36–50)
Lab: 7.2 g/dL — ABNORMAL LOW (ref 7.30–7.40)

## 2017-03-20 LAB — POC GLUCOSE
Lab: 160 mg/dL — ABNORMAL HIGH (ref 70–100)
Lab: 169 mg/dL — ABNORMAL HIGH (ref 70–100)
Lab: 140 mg/dL — ABNORMAL HIGH (ref 70–100)
Lab: 143 mg/dL — ABNORMAL HIGH (ref 70–100)

## 2017-03-20 LAB — CREATINE KINASE-CPK: Lab: 16 U/L — ABNORMAL LOW (ref 21–215)

## 2017-03-20 LAB — URIC ACID: Lab: 13 mg/dL — ABNORMAL HIGH (ref 2.0–7.0)

## 2017-03-20 LAB — URINALYSIS MICROSCOPIC REFLEX TO CULTURE

## 2017-03-20 LAB — LACTIC ACID (BG - RAPID LACTATE): Lab: 1.3 MMOL/L (ref 0.5–2.0)

## 2017-03-20 LAB — MAGNESIUM: Lab: 1.7 mg/dL — ABNORMAL LOW (ref 1.6–2.6)

## 2017-03-20 LAB — BASIC METABOLIC PANEL: Lab: 132 MMOL/L — ABNORMAL LOW (ref 137–147)

## 2017-03-20 MED ORDER — HEPARIN (PORCINE) IN 5 % DEX 20,000 UNIT/500 ML (40 UNIT/ML) IV SOLP
0-2000 [IU]/h | INTRAVENOUS | 0 refills | Status: DC
Start: 2017-03-20 — End: 2017-03-21
  Administered 2017-03-21: 05:00:00 1670 [IU]/h via INTRAVENOUS

## 2017-03-20 MED ORDER — CALCIUM CARBONATE 200 MG CALCIUM (500 MG) PO CHEW
500 mg | Freq: Once | ORAL | 0 refills | Status: CP
Start: 2017-03-20 — End: ?
  Administered 2017-03-21: 02:00:00 500 mg via ORAL

## 2017-03-20 MED ORDER — FUROSEMIDE 10 MG/ML IJ SOLN
60 mg | Freq: Once | INTRAVENOUS | 0 refills | Status: CP
Start: 2017-03-20 — End: ?
  Administered 2017-03-20: 23:00:00 60 mg via INTRAVENOUS

## 2017-03-20 MED ORDER — MAGNESIUM SULFATE IN D5W 1 GRAM/100 ML IV PGBK
1 g | Freq: Once | INTRAVENOUS | 0 refills | Status: CP
Start: 2017-03-20 — End: ?
  Administered 2017-03-20: 18:00:00 1 g via INTRAVENOUS

## 2017-03-20 MED ORDER — FUROSEMIDE 10 MG/ML IJ SOLN
60 mg | Freq: Once | INTRAVENOUS | 0 refills | Status: CP
Start: 2017-03-20 — End: ?
  Administered 2017-03-20: 22:00:00 60 mg via INTRAVENOUS

## 2017-03-20 MED ORDER — FUROSEMIDE 10 MG/ML IJ SOLN
80 mg | Freq: Once | INTRAVENOUS | 0 refills | Status: CP
Start: 2017-03-20 — End: ?
  Administered 2017-03-20: 17:00:00 80 mg via INTRAVENOUS

## 2017-03-20 MED ORDER — HEPARIN (PORCINE) BOLUS FOR CONTINUOUS INF (BAG)
20-40 [IU]/kg | INTRAVENOUS | 0 refills | Status: DC
Start: 2017-03-20 — End: 2017-03-21

## 2017-03-20 MED ORDER — MELATONIN 5 MG PO TAB
5 mg | Freq: Every evening | ORAL | 0 refills | Status: DC
Start: 2017-03-20 — End: 2017-03-28
  Administered 2017-03-22 – 2017-03-27 (×5): 5 mg via ORAL

## 2017-03-20 MED ORDER — HEPARIN (PORCINE) BOLUS FOR CONTINUOUS INF (BAG)
7500 [IU] | Freq: Once | INTRAVENOUS | 0 refills | Status: CP
Start: 2017-03-20 — End: ?

## 2017-03-20 MED ORDER — FUROSEMIDE IV DRIP SYR (MAX CONC)
10 mg/h | INTRAVENOUS | 0 refills | Status: DC
Start: 2017-03-20 — End: 2017-03-24
  Administered 2017-03-20: 18:00:00 5 mg/h via INTRAVENOUS
  Administered 2017-03-21 – 2017-03-23 (×3): 10 mg/h via INTRAVENOUS

## 2017-03-20 MED ORDER — FUROSEMIDE 10 MG/ML IJ SOLN
120 mg | Freq: Once | INTRAVENOUS | 0 refills | Status: DC
Start: 2017-03-20 — End: 2017-03-20

## 2017-03-20 NOTE — Progress Notes
OCCUPATIONAL THERAPY  NOTE    Patient rapid responded this morning and transferred to CICU for change in medical status. Occupational therapy will continue to follow and provide intervention as indicated.    [employer] OTR/L [phone]

## 2017-03-20 NOTE — Progress Notes
PHYSICAL THERAPY  NOTE     Per chart review and team discussion, patient has transferred to the CICU this date. PT to hold this date and will follow-up with patient at a later time.     Therapist: Leveda Anna, PT, DPT  Date: 03/20/2017

## 2017-03-20 NOTE — Progress Notes
Patient arrived to room # [phone]*) via bed accompanied by RN. Patient transferred to the bed with assistance. Bedside safety checks completed. Initial patient assessment completed, refer to flowsheet for details. Admission skin assessment completed by: Huey Romans, RN and Estill Bamberg, RN   Pressure Injury Present on Hospital Admission (within 24 hours): No    1. Occiput: No  2. Ear: No  3. Scapula: No  4. Spinous Process: No  5. Shoulder: No  6. Elbow: No  7. Iliac Crest: No  8. Sacrum/Coccyx: No  9. Ischial Tuberosity: No  10. Trochanter: No  11. Knee: No  12. Malleolus: No  13. Heel: No  14. Toes: No  15. Assessed for device associated injury Yes  16. Nursing Nutrition Assessment Completed Yes    See Doc Flowsheet for additional wound details.     INTERVENTIONS:

## 2017-03-20 NOTE — Response Teams
Code Blue Team Progress Note    Date: 03/20/2017 Time: 9:39 AM  Patient: Diane Savage  Attending: Samuel Jester, MD Service: Cardiology-1st Round/CCU- [id number]  Admission Date: 03/13/2017  LOS: 7 days    A Code/Rapid Response Timeline Event Report has been created for this patient on 03-20-17 at 0917        The lead provider for this event was Dr Hoover Brunette.  Pt transferred to CICU at this time.      Alba Cory, RN

## 2017-03-20 NOTE — Progress Notes
Renal Progress Note    Name:  Diane Savage   ZOXWR'U Date:  03/20/2017  Admission Date: 03/13/2017  LOS: 7 days          Assessment and Plan   Principal Problem:    Atrial fibrillation with rapid ventricular response (HCC)  Active Problems:    Type 2 diabetes mellitus with circulatory disorder, without long-term current use of insulin (HCC)    PAF (paroxysmal atrial fibrillation) (HCC)    Chronic gastrointestinal bleeding    Transfusion-dependent anemia    CAD (coronary artery disease), native coronary artery    HLD (hyperlipidemia)    Hypothyroidism    AKI (acute kidney injury) (HCC)    Essential hypertension    Pleural effusion on left    Left leg cellulitis    Barrett's esophagus    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 female s/p CABG admitted for dyspnea and volume overload.  ???  AKI  -no significant CKD  -renal ultrasound without hydronephrosis  -no recent nephrotoxins  -creatinine progressively increasing  -I&O appear inaccurate  -FeUrea 8% suggesting renal hypoperfusion  ???  Atrial flutter  Leukocytosis  CAD s/p CABG  Anemia  HTN  Hyponatremia, hypervolemic  ???  Recommendations:  -renal function stable  -continue IV diuresis  -no indication for hemodialysis at this time  -strict I&O, standing daily wieght  -please call with questions or concerns  ???  [name], MD  PGY-5 Nephrology Fellow  Pager [phone]  ???      Subjective     Diane Savage is a 61 y.o. female remains altered, transferred to CICU, foley catheter being placed, blood cultures drawn.      Medications     Medications    MEDS  aspirin 81 mg Oral QDAY   cetirizine 10 mg Oral QAM8   diltiazem 45 mg Oral Q6H   doxycycline 100 mg Oral BID   folic acid 1 mg Oral QDAY   furosemide 60 mg Intravenous ONCE   Followed by      furosemide 60 mg Intravenous ONCE   heparin (porcine) 5,000 Units Subcutaneous Q8H insulin aspart U-100 0-14 Units Subcutaneous ACHS   insulin NPH 18 Units Subcutaneous QDAY(07)   levothyroxine 175 mcg Oral QDAY 30 min before breakfast   metoprolol tartrate 100 mg Oral BID   nystatin  Topical BID   pantoprazole DR 80 mg Oral QDAY(21)   rosuvastatin 40 mg Oral QDAY   senna/docusate 2 tablet Oral BID   sucralfate 1 g Oral QID    IV MEDS  ??? furosemide (LASIX) 500 mg in 50 mL IV drip syr (max conc) 10 mg/hr (03/20/17 1537)     Prn acetaminophen Q6H PRN 650 mg at 03/20/17 0826, hydrOXYzine TID PRN 25 mg at 03/18/17 1246, nitroglycerin Q5 MIN PRN, ondansetron Q8H PRN 8 mg at 03/17/17 0956, traMADol Q6H PRN 50 mg at 03/18/17 2114           Physical Exam     Vital Signs: Last Filed In 24 Hours Vital Signs: 24 Hour Range   BP: 143/67 (10/18 1600)  Temp: 36.6 ???C (97.8 ???F) (10/18 1200)  Pulse: 75 (10/18 1600)  Respirations: 21 PER MINUTE (10/18 1600)  SpO2: 96 % (10/18 1600)  O2 Delivery: Nasal Cannula (10/18 1600)  SpO2 Pulse: 74 (10/18 1600) BP: (103-148)/(42-78)   Temp:  [36.6 ???C (97.8 ???F)-36.9 ???C (98.4 ???  F)]   Pulse:  [69-76]   Respirations:  [15 PER MINUTE-21 PER MINUTE]   SpO2:  [93 %-99 %]   O2 Delivery: Nasal Cannula   Intensity Pain Scale (Self Report): 5 (03/20/17 0930)        Intake/Output Summary (Last 24 hours) at 03/20/17 1649  Last data filed at 03/20/17 1400   Gross per 24 hour   Intake              201 ml   Output              130 ml   Net               71 ml      Vitals:    03/19/17 0500 03/19/17 1157 03/20/17 0452   Weight: 113 kg (249 lb 1.9 oz) 112.9 kg (249 lb) 111.6 kg (246 lb 0.5 oz)     ???  Gen: drowsy, mild respiratory distress   HEENT: Sclera normal; MMM   CV:+ JVD, regular rhythm  Pulm: Clear to Auscultation bilateral, diminished  GI: BS+ x4, non-tender to palpation  Neuro: drowsy, moving all ext  Ext: moderate ble edema, no clubbing or cyanosis   Skin: no rash, dry      Labs:      Recent Labs      03/18/17   0410  03/19/17   0350  03/19/17   1535  03/20/17   0750 NA  133*  133*  131*  132*   K  4.4  4.3  4.4  4.2   CL  100  99  99  98   CO2  25  23  24  24    GAP  8  11  8  10    BUN  40*  48*  48*  50*   CR  2.80*  3.06*  3.10*  2.97*   GLU  207*  149*  142*  160*   CA  9.2  9.3  9.6  9.6   ALBUMIN  3.2*  3.4*  3.5   --    MG   --    --    --   1.7       Recent Labs      03/18/17   0410  03/19/17   0350  03/19/17   1535  03/20/17   0750   WBC  7.0  10.9   --   7.2   HGB  8.4*  8.9*   --   9.1*   HCT  26.3*  27.4*   --   28.3*   PLTCT  167  200   --   196   AST  10  14  14    --    ALT  4*  5*  6*   --    ALKPHOS  62  59  60   --       Estimated Creatinine Clearance: 24.6 mL/min (A) (based on SCr of 2.97 mg/dL (H)).  Vitals:    03/19/17 0500 03/19/17 1157 03/20/17 0452   Weight: 113 kg (249 lb 1.9 oz) 112.9 kg (249 lb) 111.6 kg (246 lb 0.5 oz)    Recent Labs      03/19/17   0445  03/19/17   2357   PHART  7.30*  7.27*   PO2ART  76*  76*

## 2017-03-20 NOTE — Case Management (ED)
Case Management Progress Note    NAME:Diane Savage                          MRN: [id number]              DOB:Jul 22, 1955          AGE: 61 y.o.  ADMISSION DATE: 03/13/2017             DAYS ADMITTED: LOS: 7 days      Todays Date: 03/20/2017    Plan  Pt to continue inpt CICU care. Pt anticipated to d/c to home with family assist when medically stable, likely early next week. PT/OT consulted.    Interventions  ? Support   Support: Pt/Family Updates re:POC or DC Plan, Patient Education  ? Info or Referral      ? Discharge Planning      ? Medication Needs   Medication Needs: Medication Assistance Resources in the Community    SW attempted to meet with pt to discuss Pradaxa PAP and obtain information and signature on application, pt currently experiencing discomfort and requested SW leave room. SW to f/u at a later time.     Pt discussed with team in huddle, no longer planning for antiarrythmic and now planning for Coumadin instead of Pradaxa at this time for anticoagulation. Pt anticipated to d/c next week.    ? Financial      ? Legal      ? Other        Disposition  ? Expected Discharge Date    Expected Discharge Date: 03/22/17  ? Transportation   Does the patient need discharge transport arranged?: No  Transportation Name, Phone and Availability #1: Rush Landmark (husband) [phone]  Does the patient use Medicaid Transportation?: No  ? Next Level of Care (Acute Psych discharges only)      ? Discharge Disposition                                          Durable Medical Equipment     No service has been selected for the patient.      [place] [facility]     No service has been selected for the patient.      [place]     No service has been selected for the patient.      [facility]     No service has been selected for the patient.        Currie Paris, LMSW  p. [phone]

## 2017-03-20 NOTE — Progress Notes
General Progress Note    Name:  Diane Savage   ZOXWR'U Date:  03/20/2017  Admission Date: 03/13/2017  LOS: 7 days                     Assessment/Plan:    Principal Problem:    Atrial fibrillation with rapid ventricular response (HCC)  Active Problems:    Type 2 diabetes mellitus with circulatory disorder, without long-term current use of insulin (HCC)    PAF (paroxysmal atrial fibrillation) (HCC)    Chronic gastrointestinal bleeding    Transfusion-dependent anemia    CAD (coronary artery disease), native coronary artery    HLD (hyperlipidemia)    Hypothyroidism    AKI (acute kidney injury) (HCC)    Essential hypertension    Pleural effusion on left    Left leg cellulitis    Barrett's esophagus    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 female, the patient has a past medical history of Acquired hypothyroidism; Arthritis; Back pain; Bleeding disorder (HCC); Coronary artery disease; Diabetes mellitus (HCC); Hypertension; Stomach disorder; and Vision decreased. presenting with shortness of breath, dizziness/lightheadedness, chills, nausea, lower extremity swelling and palpitations.  ???  1. Atrial fluter, not on AC  - has had intermittent atrial flutter since CABG  - not on AC due to history of GIB  - has had palpitations for about 1 week prior to presentation, no CP or syncope  - EKG on admission: atrial fibrillation, rate 98, no ST changes  - GI and CTS consulted to discuss effusion and AC in setting of bleeding history   - EGD showing 3 columns of esophageal varices, mild portal hypertensive gastropathy without bleeding, gave without bleeding and gastric antrum, mild, and possible Barrett's esophagus, no biopsies taken  - Rates controlled on dilt               Plan for today:               > Continue ASA and Metoprolol   > diltiazem 45mg  Q6hr, rates controlled   > starting heparin drip if CT Head negative    > consider sotalol > considering TEE Friday, with possible cardioversion     > will likely opt for rate control only, lifetime warfarin is current plan   > CT Head given altered mental status  ???  2. Shortness of Breath/Lower extremity swelling, concern for DVT, volume overload  - LLE swelling greater than right since surgery  - has history of DVT  - BNP 327 on admission   - CXR with persistent left sided pleural effusion   - IR drain and Chest tube on left side, 40 cc overnight  -CTS okay with anticoagulation while chest tube is in place  -Chest x-ray worse overnight showing increased pulmonary vascular congestion  -Net positive 250 cc over last 24 hours  -I/O not accurate  Plan for today:   > 80 IV Lasix in a.m., started lasix drip, intermittent diuril   > Start BiPAP    3.  Acute kidney injury  -Likely combination of prerenal and intrinsic  -Consistent with prerenal disease  -Renal ultrasound unremarkable: Urine output, oliguria  -Changed amoxicillin to doxycycline  - Urine lytes, eosinophils, culture, CPK, uric acid, urine creatinine pending  Plan:   > Avoid nephrotoxic medications   > Nephrology consult    4.  Altered mental status  -Hypercapnia  versus uremia versus infectious versus delirium  -Has been lethargic and confused for the past 3 days now  -Wanted to leave AMA twice even after describing the severity of her illness  Plan:   >BiPAP, repeat ABG   >Diuresis with lasix drip   >on doxycycline for LLE cellulitis    5.  Cellulitis of lower extremity, left  - Amoxicillin started on 10/12, end 10/15  Plan:   >continue doxycycline, end 03/23/17  ???  6. 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  7. Anemia, normocytic, chronic blood loss, not due to postoperative complications  -Chronic secondary to blood loss -EGD showing 3 columns of esophageal varices, mild portal hypertensive gastropathy without bleeding, G AVE without bleeding in gastric antrum, mild, and possible Barrett's esophagus, no biopsies taken  - has had extensive work up and evaluation by GI  - frequent upper/lower GIB  - requires frequent blood transfusions, last transfusion 10/12               Plan for today:                > monitor    8. Hypothyroidism  > Continue Synthroid  ???  9. HLD  > Continue PTA statin  ???  10. HTN  > Continue metoprolol  ???  10. DMII   - PTA regimen: NPH 18u QD, Novolog 4u TID w/ meals  > Continue PTA NPH, add on mid dose correction factor  ???  Fluids, electrolytes and Nutrition:  IVF: no IVF  Electrolytes: Monitor and replace PRN.  Diet:  Cardiac Diet  ???  Prophylaxis:  DVT: SCD  Stress ulcer: PPI  ???  Code status: Full Code  Disposition: contiue on CV1, transferred to ICU  ???  Patient discussed with Dr. Duane Lope.    Ricard Dillon DO  Internal Medicine PGY-1   Pager: [phone]    ________________________________________________________________________    Subjective    Patient seen and examined this morning.  During the evening she was given 0.5 mg of Ativan for agitation.  Per nursing, she was only responsive to sternal rub later in the night.  This morning upon examination patient was awake and again asking to go home.  After another long discussion she was agreeable to go up to the ICU.  Patient was alert and oriented this morning but unable to again answer why she was in the hospital.  Patient denied any headache, nausea, vomiting, fevers, chills, chest pain, palpitations, chest tightness, shortness of breath, abdominal pain.  She was very argumentative this morning, similar to the previous night.    Medications  Scheduled Meds:    aspirin chewable tablet 81 mg 81 mg Oral QDAY   cetirizine (ZYRTEC) tablet 10 mg 10 mg Oral QAM8   diltiazem (cardIZEM) tablet 45 mg 45 mg Oral Q6H doxycycline (VIBRAMYCIN) tablet 100 mg 100 mg Oral BID   folic acid (FOLVITE) tablet 1 mg 1 mg Oral QDAY   heparin (porcine) PF syringe 5,000 Units 5,000 Units Subcutaneous Q8H   insulin aspart U-100 (NOVOLOG FLEXPEN) injection PEN 0-14 Units 0-14 Units Subcutaneous ACHS  insulin NPH (HUMULIN N KwikPen) injection PEN 18 Units 18 Units Subcutaneous QDAY(07)   levothyroxine (SYNTHROID) tablet 175 mcg 175 mcg Oral QDAY 30 min before breakfast   magnesium sulfate   1 g/D5W 100 mL IVPB 1 g Intravenous ONCE   metoprolol tartrate (LOPRESSOR) tablet 100 mg 100 mg Oral BID   nystatin (NYSTOP) topical powder  Topical BID   pantoprazole DR (PROTONIX) tablet 80 mg 80 mg Oral QDAY(21)   rosuvastatin (CRESTOR) tablet 40 mg 40 mg Oral QDAY   senna/docusate (SENOKOT-S) tablet 2 tablet 2 tablet Oral BID   sucralfate (CARAFATE) tablet 1 g 1 g Oral QID   Continuous Infusions:  ??? furosemide (LASIX) 500 mg in 50 mL IV drip syr (max conc) 5 mg/hr (03/20/17 1259)     PRN and Respiratory Meds:acetaminophen Q6H PRN, hydrOXYzine TID PRN, nitroglycerin Q5 MIN PRN, ondansetron Q8H PRN, traMADol Q6H PRN      Review of Systems:  A 14 point review of systems was negative except for: Discussed in subjective    Objective:                          Vital Signs: Last Filed                 Vital Signs: 24 Hour Range   BP: 123/64 (10/18 1200)  Temp: 36.6 ???C (97.8 ???F) (10/18 1200)  Pulse: 74 (10/18 1200)  Respirations: 18 PER MINUTE (10/18 1200)  SpO2: 99 % (10/18 1213)  O2 Delivery: Non Invasive Ventilation (CPAP) (10/18 1213)  SpO2 Pulse: 74 (10/18 1200) BP: (103-148)/(42-78)   Temp:  [36.6 ???C (97.8 ???F)-36.9 ???C (98.4 ???F)]   Pulse:  [69-76]   Respirations:  [15 PER MINUTE-18 PER MINUTE]   SpO2:  [93 %-99 %]   O2 Delivery: Non Invasive Ventilation (CPAP)   Intensity Pain Scale (Self Report): 5 (03/20/17 0930) Vitals:    03/19/17 0500 03/19/17 1157 03/20/17 0452   Weight: 113 kg (249 lb 1.9 oz) 112.9 kg (249 lb) 111.6 kg (246 lb 0.5 oz) Intake/Output Summary:  (Last 24 hours)    Intake/Output Summary (Last 24 hours) at 03/20/17 1307  Last data filed at 03/20/17 1100   Gross per 24 hour   Intake              500 ml   Output              280 ml   Net              220 ml      Stool Occurrence: 1    Physical Exam  General:  Alert, cooperative, no distress, appears stated age, uncomfortable  Eyes:  Conjunctivae/corneas clear.  EOMI bilaterally, no conjunctival injection  Lungs:  Clear to auscultation bilaterally decreased breath sounds on the left  Heart:   Regular rhythm, tachycardic, S1, S2 normal, no appreciable murmur  Abdomen:  Soft, non-tender.  Bowel sounds normal.  No masses.  No organomegaly.  Extremities: Bilateral lower extremities.  1+ pitting edema bilaterally, mildly worse on the left side, there is a scar assumably where the vein graft was harvested, erythema improved, without any purulence noted  Skin: Scar on left leg from saphenous vein graft without purulence, there is erythema, warmth is equal bilaterally  Neurologic:   CNII - XII intact.  Normal strength, sensation and reflexes throughout.  Psych:   A and O x3, lethargic, slow to answer questions, argumentative  Lab Review  24-hour labs:    Results for orders placed or performed during the hospital encounter of 03/13/17 (from the past 24 hour(s))   COMPREHENSIVE METABOLIC PANEL    Collection Time: 03/19/17  3:35 PM   Result Value Ref Range    Sodium 131 (L) 137 - 147 MMOL/L    Potassium 4.4 3.5 - 5.1 MMOL/L    Chloride 99 98 - 110 MMOL/L    Glucose 142 (H) 70 - 100 MG/DL    Blood Urea Nitrogen 48 (H) 7 - 25 MG/DL    Creatinine 4.54 (H) 0.4 - 1.00 MG/DL    Calcium 9.6 8.5 - 09.8 MG/DL    Total Protein 7.6 6.0 - 8.0 G/DL    Total Bilirubin 0.7 0.3 - 1.2 MG/DL    Albumin 3.5 3.5 - 5.0 G/DL    Alk Phosphatase 60 25 - 110 U/L    AST (SGOT) 14 7 - 40 U/L    CO2 24 21 - 30 MMOL/L    ALT (SGPT) 6 (L) 7 - 56 U/L    Anion Gap 8 3 - 12    eGFR Non African American 15 (L) >60 mL/min eGFR African American 19 (L) >60 mL/min   POC GLUCOSE    Collection Time: 03/19/17  5:48 PM   Result Value Ref Range    Glucose, POC 173 (H) 70 - 100 MG/DL   POC GLUCOSE    Collection Time: 03/19/17  9:12 PM   Result Value Ref Range    Glucose, POC 160 (H) 70 - 100 MG/DL   CULTURE-BLOOD W/SENSITIVITY    Collection Time: 03/19/17  9:50 PM   Result Value Ref Range    Battery Name BLOOD CULTURE     Specimen Description BLOOD  RIGHT  ANTECUBITAL       Special Requests NONE     Culture NO GROWTH 1 DAY     Report Status     LACTIC ACID (BG - RAPID LACTATE)    Collection Time: 03/19/17  9:50 PM   Result Value Ref Range    Lactic Acid,BG 1.3 0.5 - 2.0 MMOL/L   CULTURE-BLOOD W/SENSITIVITY    Collection Time: 03/19/17  9:58 PM   Result Value Ref Range    Battery Name BLOOD CULTURE     Specimen Description BLOOD  LEFT  ANTECUBITAL       Special Requests NONE     Culture NO GROWTH 1 DAY     Report Status     BLOOD GASES, ARTERIAL    Collection Time: 03/19/17 11:57 PM   Result Value Ref Range    pH-Arterial 7.27 (L) 7.35 - 7.45    pCO2-Arterial 59 (H) 35 - 45 MMHG    pO2-Arterial 76 (L) 80 - 100 MMHG    Base Deficit-Arterial 0.8 MMOL/L    O2 Sat-Arterial 95.0 95 - 99 %    Bicarbonate-ART-Cal 23.7 21 - 28 MMOL/L   BASIC METABOLIC PANEL    Collection Time: 03/20/17  7:50 AM   Result Value Ref Range    Sodium 132 (L) 137 - 147 MMOL/L    Potassium 4.2 3.5 - 5.1 MMOL/L    Chloride 98 98 - 110 MMOL/L    CO2 24 21 - 30 MMOL/L    Anion Gap 10 3 - 12    Glucose 160 (H) 70 - 100 MG/DL    Blood Urea Nitrogen 50 (H) 7 - 25 MG/DL    Creatinine 1.19 (H) 0.4 - 1.00  MG/DL    Calcium 9.6 8.5 - 16.1 MG/DL    eGFR Non African American 16 (L) >60 mL/min    eGFR African American 19 (L) >60 mL/min   CBC AND DIFF    Collection Time: 03/20/17  7:50 AM   Result Value Ref Range    White Blood Cells 7.2 4.5 - 11.0 K/UL    RBC 2.88 (L) 4.0 - 5.0 M/UL    Hemoglobin 9.1 (L) 12.0 - 15.0 GM/DL    Hematocrit 09.6 (L) 36 - 45 %    MCV 98.1 80 - 100 FL MCH 31.4 26 - 34 PG    MCHC 32.0 32.0 - 36.0 G/DL    RDW 04.5 (H) 11 - 15 %    Platelet Count 196 150 - 400 K/UL    MPV 8.2 7 - 11 FL    Neutrophils 68 41 - 77 %    Lymphocytes 14 (L) 24 - 44 %    Monocytes 12 4 - 12 %    Eosinophils 5 0 - 5 %    Basophils 1 0 - 2 %    Absolute Neutrophil Count 4.90 1.8 - 7.0 K/UL    Absolute Lymph Count 1.00 1.0 - 4.8 K/UL    Absolute Monocyte Count 0.90 (H) 0 - 0.80 K/UL    Absolute Eosinophil Count 0.30 0 - 0.45 K/UL    Absolute Basophil Count 0.10 0 - 0.20 K/UL   MAGNESIUM    Collection Time: 03/20/17  7:50 AM   Result Value Ref Range    Magnesium 1.7 1.6 - 2.6 mg/dL   CREATINE KINASE-CPK    Collection Time: 03/20/17  7:50 AM   Result Value Ref Range    Creatine Kinase 16 (L) 21 - 215 U/L   URIC ACID    Collection Time: 03/20/17  7:50 AM   Result Value Ref Range    Uric Acid 13.7 (H) 2.0 - 7.0 MG/DL   POC GLUCOSE    Collection Time: 03/20/17  8:25 AM   Result Value Ref Range    Glucose, POC 169 (H) 70 - 100 MG/DL   URINALYSIS DIPSTICK REFLEX TO CULTURE    Collection Time: 03/20/17 11:40 AM   Result Value Ref Range    Color,UA YELLOW     Turbidity,UA 1+ (A) CLEAR-CLEAR    Specific Gravity-Urine 1.014 1.003 - 1.035    pH,UA 5.0 5.0 - 8.0    Protein,UA NEG NEG-NEG    Glucose,UA NEG NEG-NEG    Ketones,UA NEG NEG-NEG    Bilirubin,UA NEG NEG-NEG    Blood,UA NEG NEG-NEG    Urobilinogen,UA NORMAL NORM-NORMAL    Nitrite,UA NEG NEG-NEG    Leukocytes,UA NEG NEG-NEG    Urine Ascorbic Acid, UA NEG NEG-NEG   URINALYSIS MICROSCOPIC REFLEX TO CULTURE    Collection Time: 03/20/17 11:40 AM   Result Value Ref Range    WBCs,UA 0-2 0 - 2 /HPF    RBCs,UA 0-2 0 - 3 /HPF    Comment,UA       Urine submitted for reflex culture if criteria are met:WBC>10, positive nitrite   and/or >=1+ leukocyte esterase. If quantity is not sufficient, an addendum will   follow.      MucousUA TRACE     Hyaline Cast 2-5     Amorphous Sedimate,UA FEW    BLOOD GASES, PERIPHERAL VENOUS    Collection Time: 03/20/17 11:55 AM Result Value Ref Range    pH-Venous 7.24 (L) 7.30 - 7.40  PCO2-Venous 65 (H) 36 - 50 MMHG    PO2-Venous 44 33 - 48 MMHG    Base Deficit-Venous 0.9 MMOL/L    O2 Sat-Venous 74.1 (H) 55 - 71 %    Bicarbonate-VEN-Cal 23.4 MMOL/L   POC GLUCOSE    Collection Time: 03/20/17 12:13 PM   Result Value Ref Range    Glucose, POC 140 (H) 70 - 100 MG/DL       Point of Care Testing  (Last 24 hours)  Glucose: (!) 160 (03/20/17 0750)  POC Glucose (Download): (!) 140 (03/20/17 1213)    Radiology and other Diagnostics Review:    Chest x-ray 03/20/17  1. ???Persistent marked enlargement of the cardiac silhouette with pulmonary   edema.  2. ???Stable left pleural effusion and left basilar consolidation, likely   Atelectasis.    CT head pending    Ricard Dillon DO  Internal Medicine PGY 1  Pager [phone]

## 2017-03-20 NOTE — Progress Notes
Called patient room at approximately 19: 00 as patient was agitated and wanted to leave AMA.  Upon entering room patient stated that she wanted to go home.  Asked her what the issue was and she stated that her anxiety was the reason why her breathing was so poor in the hospital.  I described in detail that her chest x-ray findings this morning were consistent with volume overload which we did in fact go over this morning.  She stated that this is not actually the problem.  I also discussed that earlier in the day we had, as a team, explore the option of BiPAP and potential ICU transfer.  She believed that by going home she would be more comfortable and be able to breathe better.    Additionally she did not understand that her poor kidney function and decreased urine output is playing a part in her worsening respiratory status.  She was not able to converse appropriately.  Simply replied that she wanted to go home.  Did not feel that she had capacity to make such a decision.  Husband at bedside agreed that she was in fact confused and not at her baseline.    Eventually came to agreement that I would revisit patient and she would try Ativan 0.5 mg IV for anxiety.    Melchor Amour DO  Internal medicine PGY 1  Pager [phone]

## 2017-03-20 NOTE — Progress Notes
Critical Care Progress Note      Today's Date:  03/20/2017  Name:  Diane Savage                       MRN:  [id number]   Admission Date: 03/13/2017  LOS: 7 days                     Assessment/Plan:   Principal Problem:    Atrial fibrillation with rapid ventricular response (HCC)  Active Problems:    Type 2 diabetes mellitus with circulatory disorder, without long-term current use of insulin (HCC)    PAF (paroxysmal atrial fibrillation) (HCC)    Chronic gastrointestinal bleeding    Transfusion-dependent anemia    CAD (coronary artery disease), native coronary artery    HLD (hyperlipidemia)    Hypothyroidism    AKI (acute kidney injury) (HCC)    Essential hypertension    Pleural effusion on left    Left leg cellulitis    Barrett's esophagus    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  She has not been anticoagulated for her atrial fib due to her bleeding issues.  She also had a persistent left pleural effusion which is been present since her bypass surgery. Transferred to CCU for respiratory distress and AMS.    Neuro: PRN pain meds; Alert and oriented on arrival to CCU.   Cardiac: HFpEF with recent CABG in September. ASA, dilt, metop, statin. Start lasix gtt for volume overload.   Pulmonary: L pleural effusion with recent pig tail placed by IR. Never fully drained per review of CXRs. Removed on 10/17. Would not attempt to tap again at this point. Start NiPPV on arrival to ICU for hypercarbia (likely chronic) and pulmonary edema/increased WOB.  FEN: NPO while on NiPPV but can have diet off mask intermittently. ID:  On doxy for cellulitis. Recently switched from Beta Lactam class on 10/15 out of concern for AIN as etiology of worsening renal Fx. Clinically cellulitis much improved. Scr seems to be plat  Renal:  AKI on CKD. Renal following. Unclear etiology of worsening renal Fx at this time. Appears to be peaking.  Heme:  Stable. 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 GI scoped recently and felt safe to anticoagulate.  Endo:  Modified [facility] Insulin Protocol, synthroid  Prophylaxis: HOB>40, PPI, DVT prophylaxis per primary team  Disposition/Family: Needs ICU for AMS, worsening respiratory status, renal failure.  __________________________________________________________________________________  Subjective:  Diane Savage is a 61 y.o. female.  Overnight Events: No new events noted.  Patient arrived to ICU alert and oriented. POC discussed with her and husband. Not agreeable to foley as it was irritating her despite discussion about need for accurate I/Os. Was planning to leave AMA yesterday evening and this AM per documentation.    Objective:  Medications:  Scheduled Meds:  aspirin chewable tablet 81 mg 81 mg Oral QDAY   cetirizine (ZYRTEC) tablet 10 mg 10 mg Oral QAM8   diltiazem (cardIZEM) tablet 45 mg 45 mg Oral Q6H   doxycycline (VIBRAMYCIN) tablet 100 mg 100 mg  Oral BID   folic acid (FOLVITE) tablet 1 mg 1 mg Oral QDAY   heparin (porcine) PF syringe 5,000 Units 5,000 Units Subcutaneous Q8H   insulin aspart U-100 (NOVOLOG FLEXPEN) injection PEN 0-14 Units 0-14 Units Subcutaneous ACHS   insulin NPH (HUMULIN N KwikPen) injection PEN 18 Units 18 Units Subcutaneous QDAY(07)   levothyroxine (SYNTHROID) tablet 175 mcg 175 mcg Oral QDAY 30 min before breakfast   magnesium sulfate   1 g/D5W 100 mL IVPB 1 g Intravenous ONCE   metoprolol tartrate (LOPRESSOR) tablet 100 mg 100 mg Oral BID   nystatin (NYSTOP) topical powder  Topical BID   pantoprazole DR (PROTONIX) tablet 80 mg 80 mg Oral QDAY(21) rosuvastatin (CRESTOR) tablet 40 mg 40 mg Oral QDAY   senna/docusate (SENOKOT-S) tablet 2 tablet 2 tablet Oral BID   sucralfate (CARAFATE) tablet 1 g 1 g Oral QID   Continuous Infusions:  ??? furosemide (LASIX) 500 mg in 50 mL IV drip syr (max conc) 5 mg/hr (03/20/17 1259)     PRN and Respiratory Meds:acetaminophen Q6H PRN, hydrOXYzine TID PRN, nitroglycerin Q5 MIN PRN, ondansetron Q8H PRN, traMADol Q6H PRN                     Vital Signs: Last Filed                  Vital Signs: 24 Hour Range   BP: 123/64 (10/18 1200)  Temp: 36.6 ???C (97.8 ???F) (10/18 1200)  Pulse: 74 (10/18 1200)  Respirations: 18 PER MINUTE (10/18 1200)  SpO2: 99 % (10/18 1213)  O2 Delivery: Non Invasive Ventilation (CPAP) (10/18 1213)  Weight: 111.6 kg (246 lb 0.5 oz) (10/18 0452)  BP: (103-148)/(42-78)   Temp:  [36.6 ???C (97.8 ???F)-36.9 ???C (98.4 ???F)]   Pulse:  [69-76]   Respirations:  [15 PER MINUTE-18 PER MINUTE]   SpO2:  [93 %-99 %]   O2 Delivery: Non Invasive Ventilation (CPAP)    Intensity Pain Scale (Self Report): (not recorded) Vitals:    03/19/17 0500 03/19/17 1157 03/20/17 0452   Weight: 113 kg (249 lb 1.9 oz) 112.9 kg (249 lb) 111.6 kg (246 lb 0.5 oz)       Critical Care Vitals:      ICP Monitoring:     PA  Catheter:     Hemodynamics/Oxycalcs:       Intake/Output Summary:  (Last 24 hours)    Intake/Output Summary (Last 24 hours) at 03/20/17 1315  Last data filed at 03/20/17 1100   Gross per 24 hour   Intake              500 ml   Output              280 ml   Net              220 ml         Physical Exam:    General:  Mild distress, Appears stated age  Lungs:  Clear to auscultation bilaterally  CV: RRR  Abdomen:  Soft, non-tender.  Bowel sounds normal.  No masses.  No organomegaly.  Extremities:  Extremities normal, atraumatic, no cyanosis or edema  Neurologic:  CNII - XII intact.  PERRL      Prophylaxis Review:  Lines:  No  Urinary Catheter:  No  Antibiotic Usage:  Yes; Infection present or suspected:  Wound/Skin/Tissue;  Cellulitis VTE: Per Primary    Lab Review:  Pertinent labs reviewed  Point of  Care Testing:  (Last 24 hours):  Glucose: (!) 160 (03/20/17 0750)  POC Glucose (Download): (!) 140 (03/20/17 1213)    Radiology and Other Diagnostic Procedures Review:    Pertinent radiology reviewed.    I have seen, examined and reviewed data concerning this patient.  I discussed the findings and plan of care with the ICU team. I spent 50 minutes in critical care time, excluding procedures today.    Caralee Ates, MD  Assistant Professor  Anesthesiology/Critical Care Medicine  Pager: [phone]

## 2017-03-20 NOTE — Progress Notes
Two sets of blood cultures drawn by IV therapy. First set from the right antecubital vein and the second from the left antecubital vein. Tubes labeled at bedside. Pt tolerated well.

## 2017-03-20 NOTE — Progress Notes
Patient arrived on unit via bed accompanied by RN. Patient transferred to the bed with assistance. Assessment completed, refer to flowsheet for details. Orders released, reviewed, and implemented as appropriate. Oriented to surroundings, call light within reach. Plan of care reviewed.  Will continue to monitor and assess.

## 2017-03-20 NOTE — Progress Notes
1400- Dr. Justus Memory notified that pt has not had any urine output since lasix bolus admin and gtt start. Will continue to monitor.     1615- Pt refusing re-attempt of foley catheter placement/straight cath. 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 Ok to give lasix doses when able per Dr. Justus Memory.       1945- pt transported to CT with this RN and Nicole Kindred, RN with monitor and red box. Pt transported travel well without complications.     2000- pt return to HC905. VSS. After CT scan pt refusing to have purewick replaced to track urine output.

## 2017-03-21 LAB — PTT (APTT)
Lab: 26 s — ABNORMAL LOW (ref 20.0–36.0)
Lab: 126 s — ABNORMAL HIGH (ref 20.0–36.0)
Lab: 88 s — ABNORMAL HIGH (ref 20.0–36.0)

## 2017-03-21 LAB — CBC AND DIFF
Lab: 0 % (ref 0–2)
Lab: 0 10*3/uL (ref 0–0.20)
Lab: 0.4 10*3/uL (ref 0–0.45)
Lab: 0.6 10*3/uL — ABNORMAL LOW (ref 1.0–4.8)
Lab: 0.8 10*3/uL (ref 0–0.80)
Lab: 13 % — ABNORMAL HIGH (ref 4–12)
Lab: 18 % — ABNORMAL HIGH (ref 11–15)
Lab: 187 10*3/uL (ref 150–400)
Lab: 25 % — ABNORMAL LOW (ref 36–45)
Lab: 31 pg (ref 26–34)
Lab: 32 g/dL (ref 32.0–36.0)
Lab: 4.5 10*3/uL (ref 1.8–7.0)
Lab: 6 % — ABNORMAL HIGH (ref 0–5)
Lab: 6.3 10*3/uL (ref 4.5–11.0)
Lab: 7.9 FL (ref 7–11)
Lab: 72 % (ref 41–77)
Lab: 8.2 g/dL — ABNORMAL LOW (ref 12.0–15.0)
Lab: 9 % — ABNORMAL LOW (ref 24–44)
Lab: 97 FL (ref 80–100)

## 2017-03-21 LAB — BASIC METABOLIC PANEL
Lab: 132 MMOL/L — ABNORMAL LOW (ref 137–147)
Lab: 150 mg/dL — ABNORMAL HIGH (ref 70–100)
Lab: 16 mL/min — ABNORMAL LOW (ref >60)
Lab: 2.9 mg/dL — ABNORMAL HIGH (ref 0.4–1.00)
Lab: 20 mL/min — ABNORMAL LOW (ref >60)
Lab: 26 MMOL/L (ref 21–30)
Lab: 4.4 MMOL/L (ref 3.5–5.1)
Lab: 53 mg/dL — ABNORMAL HIGH (ref 7–25)
Lab: 8 (ref 3–12)
Lab: 9.3 mg/dL (ref 8.5–10.6)
Lab: 98 MMOL/L (ref 98–110)
Lab: 132 MMOL/L — ABNORMAL LOW (ref 137–147)
Lab: 20 mL/min — ABNORMAL LOW (ref >60)
Lab: 25 mL/min — ABNORMAL LOW (ref >60)
Lab: 56 mg/dL — ABNORMAL HIGH (ref 7–25)
Lab: 9 pg (ref 3–12)
Lab: 9.1 mg/dL (ref 8.5–10.6)
Lab: 97 MMOL/L — ABNORMAL LOW (ref 98–110)

## 2017-03-21 LAB — EOSINOPHIL STAIN

## 2017-03-21 LAB — POC GLUCOSE
Lab: 193 mg/dL — ABNORMAL HIGH (ref 70–100)
Lab: 174 mg/dL — ABNORMAL HIGH (ref 70–100)
Lab: 189 mg/dL — ABNORMAL HIGH (ref 70–100)
Lab: 170 mg/dL — ABNORMAL HIGH (ref 70–100)

## 2017-03-21 LAB — CBC: Lab: 7.3 10*3/uL (ref 4.5–11.0)

## 2017-03-21 LAB — MAGNESIUM: Lab: 1.7 mg/dL — ABNORMAL LOW (ref 1.6–2.6)

## 2017-03-21 MED ORDER — HEPARIN (PORCINE) 1,000 UNIT/ML IJ SOLN
20-40 [IU]/kg | INTRAVENOUS | 0 refills | Status: DC
Start: 2017-03-21 — End: 2017-03-25

## 2017-03-21 MED ORDER — CHLOROTHIAZIDE SODIUM 500 MG IV SOLR
250 mg | Freq: Once | INTRAVENOUS | 0 refills | Status: CP
Start: 2017-03-21 — End: ?
  Administered 2017-03-21: 21:00:00 250 mg via INTRAVENOUS

## 2017-03-21 MED ORDER — CHLOROTHIAZIDE SODIUM 500 MG IV SOLR
500 mg | Freq: Once | INTRAVENOUS | 0 refills | Status: CP
Start: 2017-03-21 — End: ?
  Administered 2017-03-21: 14:00:00 500 mg via INTRAVENOUS

## 2017-03-21 MED ORDER — HEPARIN (PORCINE) 20,000 UNIT/250 ML IV INFUSION
INTRAVENOUS | 0 refills | Status: DC
Start: 2017-03-21 — End: 2017-03-25
  Administered 2017-03-21 – 2017-03-24 (×12): 1570 [IU]/h via INTRAVENOUS

## 2017-03-21 MED ORDER — POTASSIUM CHLORIDE 20 MEQ PO TBTQ
40 meq | Freq: Once | ORAL | 0 refills | Status: CP
Start: 2017-03-21 — End: ?
  Administered 2017-03-21: 13:00:00 40 meq via ORAL

## 2017-03-21 MED ORDER — MAGNESIUM OXIDE 400 MG (241.3 MG MAGNESIUM) PO TAB
400 mg | Freq: Two times a day (BID) | ORAL | 0 refills | Status: DC
Start: 2017-03-21 — End: 2017-03-28
  Administered 2017-03-21 – 2017-03-27 (×13): 400 mg via ORAL

## 2017-03-21 MED ORDER — DILTIAZEM HCL 180 MG PO CP24
180 mg | Freq: Every day | ORAL | 0 refills | Status: DC
Start: 2017-03-21 — End: 2017-03-28
  Administered 2017-03-21 – 2017-03-27 (×7): 180 mg via ORAL

## 2017-03-21 NOTE — Progress Notes
CLINICAL NUTRITION                                                        Clinical Nutrition Re-Eval Summary     NAME:Diane Savage             MRN: [id number]             DOB:[birth year]          AGE: 61 y.o.  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  She has not been anticoagulated for her atrial fib due to her bleeding issues.  She also had a persistent left pleural effusion which is been present since her bypass surgery. Transferred to CCU for respiratory distress and AMS. Pt continues with agitation and confusion, not appropriate for interview. She appears to be eating adequately on a cardiac/diabetic diet, consuming 50-100% of recent meals. No GI complaints expressed. LBM was 10/18. Wt is -1.4 kg since admission. Per chart review, pt appears to have lost ~20 lbs from May to August of this year, wts stable since then. Unclear if wt loss was intentional or unintentional but per MST screening score, pt denied unintentional wt loss. Pt with AKI, Renal following, no indication for HD. Pitting 2+ BLE/BUE edema noted. Lasix onboard. No pressure injuries noted. A1c was 7.1 01/31/17 and FSBS ranging from 137-241 mg/dL thus far this admission. Not currently at nutritional risk. Will re-eval early to determine if pt is more appropriate for interview/education.    Recommendation:  Recommend Cardiac + 45 g CHO/meal diet.                                     Crissie Sickles, BS-NDTR  Phone: [phone]  Pager: [phone]

## 2017-03-21 NOTE — Progress Notes
OCCUPATIONAL THERAPY  RE-ASSESSMENT NOTE    Patient Name: Diane Savage                   Room/Bed: HC905/01  Admitting Diagnosis:   Past Medical History:   Diagnosis Date   ??? Acquired hypothyroidism    ??? Arthritis    ??? Back pain    ??? Bleeding disorder (HCC)    ??? CAD (coronary artery disease), native coronary artery 02/04/2017   ??? Chronic diastolic heart failure (HCC) 03/19/2017   ??? Coronary artery disease    ??? Diabetes mellitus (HCC)     Type II   ??? Essential hypertension 02/11/2017   ??? Hypertension    ??? Stomach disorder    ??? Vision decreased      Mobility  Progressive Mobility Level: Active transfer to chair  Level of Assistance: Assist X2  Assistive Device: Hand Held  Time Tolerated: 11-30 minutes  Activity Limited By: Fatigue;Weakness    Subjective  Pertinent Dx per Physician: s/p CABG/MAZE/ ligation LAA on Dr. Farris Has on 02/04/17 admitted 03/13/17 with atrial fibrillation and left pleural effusion. Chest tube placed 10/12. Transferred to [facility] 10/18 for respiratory concerns.  Precautions: Falls;O2 Requirement  Pain / Complaints: Patient agrees to participate in therapy;Patient has no c/o pain    Objective  Psychosocial Status: Willing and Cooperative to Participate  Persons Present: Physical Therapist;Spouse    Home Living  Type of Home: House  Home Layout: One Level;Ramped Entrance    Prior Function  Level Of Independence: Independent with ADLs and functional transfers  Lives With: Spouse  Other Function Comments: Spouse in power scooter. Patient states she was not having difficulty with ADLS or mobility at home since CABG    ADL's  Where Assessed: Edge of Bed;Chair  Toileting Assist: Maximum Assist  Toileting Deficits: Perineal Hygiene  Functional Transfer Assist: Minimal Assist (x2)  Comment: Required minimal assist for supine to sit. Stood for standing weight with minimal assist of 2. Stood and took steps to chair with minimal hand hold assist of 2. Stood from chair with minimal assist of 2 and RN completed perineal care.    Activity Tolerance  Endurance: 2/5 Tolerates 10-20 Minutes Exercise w/Multiple Rests  Sitting Balance: 3+/5 Sits w/o UE Support for 30 Seconds or Greater    Cognition  Overall Cognitive Status: WFL to Adequately Complete Self Care Tasks Safely     Education  Persons Educated: Patient  Teaching Methods: Verbal Instruction  Patient Response: Verbalized Understanding  Topics: Role of OT, Goals for Therapy  Goal Formulation: With Patient    Assessment  Assessment: Decreased ADL Status;Decreased Endurance;Decreased Self-Care Trans;Decreased High-Level ADLs;Decreased UE Strength  Prognosis: Good;w/Cont OT s/p Acute Discharge  Goal Formulation: Patient  Comments: Patient self limiting requiring encouragement to participate and work towards getting to chair. Prior to ICU transfer, patient was ambulating in the hallway. Anticipate patient will continue to progress towards prior level of function with therapeutic intervention.    AM-PAC 6 Clicks Daily Activity Inpatient  Putting on and taking off regular lower body clothes?: A Lot  Bathing (Including washing, rinsing, drying): A Lot  Toileting, which includes using toilet, bedpan, or urinal: A Lot  Putting on and taking off regular upper body clothing: A Lot  Taking care of personal grooming such as brushing teeth: None  Eating meals?: None  Daily Activity Raw Score: 16  Standardized (t-scale) score: 35.96  CMS 0-100% Score: 53.32  CMS G Code Modifier: CK  Plan  Progress: Slow Progress, Medical Status Limitations  OT Frequency: 5x/week  OT Plan for Next Visit: Progress mobility to standing at sink for grooming with chair behind as needed.    ADL Goals  Patient Will Perform Grooming: Standing at Sink;w/ Stand By Assist  Patient Will Perform Toileting: w/ Stand By Assist    Functional Transfer Goals  Pt Will Perform All Functional Transfers: w/ Stand By Assist    OT Discharge Recommendations  OT Discharge Recommendations: Inpatient Setting Equipment Recommendations: Too early to be determined    Therapist: Azucena Kuba, OTR/L  Date: 03/21/2017

## 2017-03-21 NOTE — Progress Notes
PHYSICAL THERAPY  RE-ASSESSMENT      MOBILITY:  Mobility  Progressive Mobility Level: Active transfer to chair  Level of Assistance: Assist X2  Assistive Device: Hand Held  Time Tolerated: 11-30 minutes  Activity Limited By: Fatigue;Weakness    SUBJECTIVE:  Subjective  Significant hospital events: s/p CABG/MAZE/ ligation LAA on Dr. Farris Has on 02/04/17 admitted 03/13/17 with atrial fibrillation and left pleural effusion. Chest tube placed 10/12. rapid responded 10/18 and tranaferred to cic  Mental / Cognitive Status: Alert;Cooperative;Follows Commands  Persons Present: Physical Therapist;Spouse  Pain: Patient has no complaint of pain  Ambulation Assist: Independent Mobility at Household Level without Device  Patient Owned Equipment: None  Home Situation: Lives with Family  Type of Home: House  Entry Stairs: Ramp  In-Home Stairs: Able to Live on One Level    BED MOBILITY/TRANSFERS:  Bed Mobility/Transfers  Bed Mobility: Supine to Sit: Minimal Assist;Head of Bed Elevated;Assist with Trunk  Comments: Patient very anxious about knees buckling   Transfer Type: Sit to Stand (3 repetitions)  Transfer: Assistance Level: To/From;Bed;Minimal Assist;x2 People  Transfer: Assistive Device: None  Transfers: Type Of Assistance: For Safety Considerations;Verbal Cues    BALANCE:  Balance  Sitting Balance: Static Sitting Balance;Dynamic Sitting Balance;Standby Assist;Even Surface    GAIT:  Comments: Did not attempt ambulation patient very anxious about her knees possibly buckling     ACTIVITY/EXERCISE:  Activity / Exercise  Sit Edge Of Bed: 7 minutes  Sit Edge Of Bed Assist: Stand By Assist  Stand At Bedside : 1 minutes  Stand At Bedside Assist: Minimal Assist;x2 People    EDUCATION:  Education  Persons Educated: Patient/Family  Patient Barriers To Learning: None Noted  Interventions: Repetition of Instructions;Family Education  Teaching Methods: Verbal Instruction  Patient Response: Verbalized Understanding;More Instruction Required Topics: Plan/Goals of PT Interventions;Mobility Progression;Safety Awareness;Up with Assist Only;Importance of Increasing Activity;Ambulate With Nursing;Recommend Continued Therapy    ASSESSMENT/PROGRESS:  Assessment/Progress  Impaired Mobility Due To: Decreased Activity Tolerance;Safety Concerns;Impaired Balance;Decreased Strength  Assessment/Progress: Should Improve w/ Continued PT  AM-PAC 6 Clicks Basic Mobility Inpatient  Turning from your back to your side while in a flat bed without using bed rails: A Little  Moving from lying on your back to sitting on the side of a flatbed without using bedrails : A Little  Moving to and from a bed to a chair (including a wheelchair): A Little  Standing up from a chair using your arms (e.g. wheelchair, or bedside chair): A Little  To walk in hospital room: A Little  Climbing 3-5 steps with a railing: A Lot  Raw Score: 17  Standardized (T-scale) Score: 39.67  Basic Mobility CMS 0-100%: 43.83  CMS G Code Modifier for Basic Mobility: CK    GOALS:  Goals  Goal Formulation: With Patient  Time For Goal Achievement: 5 days  Pt Will Go Supine To/From Sit: Independently  Pt Will Transfer Bed/Chair: Independently  Pt Will Transfer Sit to Stand: Independently  Pt Will Ambulate: 101-150 Feet, w/ No Device, w/ Stand By Assist    PLAN:  Treatment Interventions: Mobility Training;Endurance Training  Plan Frequency: 5 Days per Week  PT Plan for Next Visit: work on bed mobility, transfers, and attempt ambulation next session with chair follow     RECOMMENDATIONS:  PT Discharge Recommendations  PT Discharge Recommendations: Inpatient Setting    Therapist: Lynett Grimes, PT, DPT   Date: 03/21/2017

## 2017-03-21 NOTE — Progress Notes
03/21/17 0117   Critical Care Vitals Adult   Pulse 85   Pulse Source Monitored   Monitored Rhythm Aflut   PVC / Minute 1 /min.   Respirations 17 PER MINUTE   SpO2 Pulse 77   SpO2 (!) 89 %   O2 Delivery RA   ST Segment Monitoring   ST-II -0.1 mm   ST-I 0 mm   ST-V1 0.2 mm   ST-V2 0.4 mm   ST-V3 0.2 mm   ST-V4 0 mm   ST-V5 -0.1 mm   ST-V6 -0.1 mm   ST-III -0.1 mm   ST-AVR 0.1 mm   ST-AVL 0 mm   ST-AVF -0.1 mm   Pt. found to have taken off NIV mask because pt. stated, "It was beeping too much." RN attempted to assist pt. in putting NIV mask back on but pt. verbally and physically resisted this therapy stating, "I do not want this on right now." RN provided remedial education on the purpose of the device and pt. stated, "No." Rn replaced NIV with 4 L NC device which pt. agreed to. Contiuing to monitor vital signs closely.

## 2017-03-21 NOTE — Progress Notes
General Progress Note    Name:  Diane Savage   QIONG'E Date:  03/21/2017  Admission Date: 03/13/2017  LOS: 8 days                     Assessment/Plan:    Principal Problem:    Atrial fibrillation with rapid ventricular response (HCC)  Active Problems:    Type 2 diabetes mellitus with circulatory disorder, without long-term current use of insulin (HCC)    PAF (paroxysmal atrial fibrillation) (HCC)    Chronic gastrointestinal bleeding    Transfusion-dependent anemia    CAD (coronary artery disease), native coronary artery    HLD (hyperlipidemia)    Hypothyroidism    AKI (acute kidney injury) (HCC)    Essential hypertension    Pleural effusion on left    Left leg cellulitis    Barrett's esophagus    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 female, the patient has a past medical history of Acquired hypothyroidism; Arthritis; Back pain; Bleeding disorder (HCC); Coronary artery disease; Diabetes mellitus (HCC); Hypertension; Stomach disorder; and Vision decreased. presenting with shortness of breath, dizziness/lightheadedness, chills, nausea, lower extremity swelling and palpitations.  ???  1. Atrial fluter, not on AC  - has had intermittent atrial flutter since CABG  - not on AC due to history of GIB  - has had palpitations for about 1 week prior to presentation, no CP or syncope  - EKG on admission: atrial fibrillation, rate 98, no ST changes  - GI and CTS consulted to discuss effusion and AC in setting of bleeding history   - EGD showing 3 columns of esophageal varices, mild portal hypertensive gastropathy without bleeding, gave without bleeding and gastric antrum, mild, and possible Barrett's esophagus, no biopsies taken  - Rates controlled on dilt and metoprolol 100 mg BID               Plan for today:               > Continue ASA and Metoprolol   > diltiazem 45mg  Q6hr, rates controlled    > change to Diltiazem 180 mg daily > started heparin drip    ???  2. Shortness of Breath/Lower extremity swelling, concern for DVT, volume overload  - LLE swelling greater than right since surgery  - has history of DVT  - BNP 327 on admission   - CXR with persistent left sided pleural effusion   - IR drain and Chest tube on left side  -CTS okay with anticoagulation while chest tube is in place  -Chest x-ray worse overnight showing increased pulmonary vascular congestion  -Net even over last 24 hours despite aggressive diuretic use  -I/O not accurate  -Started with 80 IV Lasix bolus, 5 mg IV Lasix drip, 120 IV Lasix bolus, 10 mg IV Lasix drip  -Also received 500 chlorothiazide  -Refusing BiPAP  Plan for today:   > Continue Lasix drip and intermittent boluses   > Encourage BiPAP    3.  Acute kidney injury  -FE urea 8.4%, consistent with prerenal etiology  -Improving with diuresis, consistent with cardiorenal syndrome  -Renal ultrasound unremarkable: Urine output, oliguria  -Changed amoxicillin to doxycycline  -Urine eosinophils pending  Plan:   > Avoid nephrotoxic medications   > Nephrology consulted    4.  Altered mental status  -Hypercapnia versus uremia versus infectious  -Has been lethargic  and confused for the past 3 days now  -Wanted to leave AMA multiple times  Plan:   >BiPAP, repeat ABG   >Diuresis with lasix drip   >on doxycycline for LLE cellulitis    5.  Cellulitis of lower extremity, left  - Amoxicillin started on 10/12, end 10/15  Plan:   >continue doxycycline, end 03/23/17   ???  6. 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  7. Anemia, normocytic, chronic blood loss, not due to postoperative complications  -Chronic secondary to blood loss  -EGD showing 3 columns of esophageal varices, mild portal hypertensive gastropathy without bleeding, G AVE without bleeding in gastric antrum, mild, and possible Barrett's esophagus, no biopsies taken  - has had extensive work up and evaluation by GI  - frequent upper/lower GIB  - requires frequent blood transfusions, last transfusion 10/12  -JKA antibodies               Plan for today:                > monitor    8. Hypothyroidism  > Continue Synthroid  ???  9. HLD  > Continue PTA statin  ???  10. HTN  > Continue metoprolol  ???  10. DMII   - PTA regimen: NPH 18u QD, Novolog 4u TID w/ meals  > Continue PTA NPH, add on mid dose correction factor  ???  Fluids, electrolytes and Nutrition:  IVF: no IVF  Electrolytes: Monitor and replace PRN.  Diet:  Cardiac Diet  ???  Prophylaxis:  DVT: SCD  Stress ulcer: PPI  ???  Code status: Full Code  Disposition: contiue on CV1, continue ICU management  ???  Patient discussed with Dr. Duane Lope.    Ricard Dillon DO  Internal Medicine PGY-1   Pager: [phone]    ________________________________________________________________________    Subjective    Patient seen and examined this morning.  No acute events overnight.  CT head was negative and heparin drip was started.  Did not want BiPAP consistently and frequently asked to go home.  On 6 L nasal cannula this morning with accessory muscle use.  Patient still denying shortness of breath.  Does not seem to understand that her breathing issues are secondary to pulmonary vascular congestion.  Denies headache, lightheadedness, dizziness, chest pain, chest tightness, shortness of breath, fevers, chills, nausea, vomiting, compact, diarrhea, constipation.  Repeatedly asked if she could go home.      Medications  Scheduled Meds:    aspirin chewable tablet 81 mg 81 mg Oral QDAY   cetirizine (ZYRTEC) tablet 10 mg 10 mg Oral QAM8   diltiazem CD (cardIZEM CD) capsule 180 mg 180 mg Oral QDAY   doxycycline (VIBRAMYCIN) tablet 100 mg 100 mg Oral BID   folic acid (FOLVITE) tablet 1 mg 1 mg Oral QDAY   heparin (porcine) injection 2,250-4,500 Units 20-40 Units/kg Intravenous As Prescribed insulin aspart U-100 (NOVOLOG FLEXPEN) injection PEN 0-14 Units 0-14 Units Subcutaneous ACHS   insulin NPH (HUMULIN N KwikPen) injection PEN 18 Units 18 Units Subcutaneous QDAY(07)   levothyroxine (SYNTHROID) tablet 175 mcg 175 mcg  Oral QDAY 30 min before breakfast   magnesium oxide (MAG-OX) tablet 400 mg 400 mg Oral BID   melatonin tablet 5 mg 5 mg Oral QHS   metoprolol tartrate (LOPRESSOR) tablet 100 mg 100 mg Oral BID   nystatin (NYSTOP) topical powder  Topical BID   pantoprazole DR (PROTONIX) tablet 80 mg 80 mg Oral QDAY(21)   rosuvastatin (CRESTOR) tablet 40 mg 40 mg Oral QDAY   senna/docusate (SENOKOT-S) tablet 2 tablet 2 tablet Oral BID   sucralfate (CARAFATE) tablet 1 g 1 g Oral QID   Continuous Infusions:  ??? furosemide (LASIX) 500 mg in 50 mL IV drip syr (max conc) 10 mg/hr (03/21/17 1339)   ??? heparin (porcine) 20,000 Units in dextrose 5% (D5W) 250 mL IV infusion (dbl conc) 1,570 Units/hr (03/21/17 0845)     PRN and Respiratory Meds:acetaminophen Q6H PRN, hydrOXYzine TID PRN, nitroglycerin Q5 MIN PRN, ondansetron Q8H PRN, traMADol Q6H PRN      Review of Systems:  A 14 point review of systems was negative except for: Discussed in subjective    Objective:                          Vital Signs: Last Filed                 Vital Signs: 24 Hour Range   BP: 123/45 (10/19 1200)  Temp: 36.9 ???C (98.4 ???F) (10/19 1200)  Pulse: 78 (10/19 1300)  Respirations: 25 PER MINUTE (10/19 1300)  SpO2: 97 % (10/19 1300)  O2 Delivery: Nasal Cannula (10/19 1200)  SpO2 Pulse: 78 (10/19 1300) BP: (103-148)/(42-69)   Temp:  [36.5 ???C (97.7 ???F)-37.1 ???C (98.8 ???F)]   Pulse:  [74-92]   Respirations:  [15 PER MINUTE-30 PER MINUTE]   SpO2:  [89 %-98 %]   O2 Delivery: Nasal Cannula   Intensity Pain Scale (Self Report): 6 (03/21/17 0000) Vitals:    03/20/17 0452 03/21/17 0600 03/21/17 1130   Weight: 111.6 kg (246 lb 0.5 oz) 112.4 kg (247 lb 12.8 oz) 100.5 kg (221 lb 9.6 oz)       Intake/Output Summary:  (Last 24 hours) Intake/Output Summary (Last 24 hours) at 03/21/17 1406  Last data filed at 03/21/17 1300   Gross per 24 hour   Intake          1590.32 ml   Output             2200 ml   Net          -609.68 ml      Stool Occurrence: 1    Physical Exam  General:  Alert, mild distress, uncooperative, A&O x3  Eyes:  Conjunctivae/corneas clear.  EOMI bilaterally, no conjunctival injection  Lungs:  Clear to auscultation bilaterally decreased breath sounds on the left  Heart:   Regular rhythm, tachycardic, S1, S2 normal, no appreciable murmur  Abdomen:  Soft, non-tender.  Bowel sounds normal.  No masses.  No organomegaly.  Extremities: Bilateral lower extremities.  2+ pitting edema bilaterally, mildly worse on the left side, there is a scar assumably where the vein graft was harvested, erythema improved, without any purulence noted  Skin: Scar on left leg from saphenous vein graft without purulence, there is erythema, warmth is equal bilaterally  Neurologic:   CNII - XII intact.  Normal strength, sensation and reflexes throughout.  Psych:   A and O x3, argumentative    Lab Review  24-hour labs:  Results for orders placed or performed during the hospital encounter of 03/13/17 (from the past 24 hour(s))   POC GLUCOSE    Collection Time: 03/20/17  5:57 PM   Result Value Ref Range    Glucose, POC 143 (H) 70 - 100 MG/DL   BASIC METABOLIC PANEL    Collection Time: 03/20/17  7:27 PM   Result Value Ref Range    Sodium 132 (L) 137 - 147 MMOL/L    Potassium 4.4 3.5 - 5.1 MMOL/L    Chloride 98 98 - 110 MMOL/L    CO2 26 21 - 30 MMOL/L    Anion Gap 8 3 - 12    Glucose 150 (H) 70 - 100 MG/DL    Blood Urea Nitrogen 53 (H) 7 - 25 MG/DL    Creatinine 2.95 (H) 0.4 - 1.00 MG/DL    Calcium 9.3 8.5 - 62.1 MG/DL    eGFR Non African American 16 (L) >60 mL/min    eGFR African American 20 (L) >60 mL/min   POC GLUCOSE    Collection Time: 03/20/17  9:12 PM   Result Value Ref Range    Glucose, POC 193 (H) 70 - 100 MG/DL   CBC AND DIFF Collection Time: 03/20/17 11:24 PM   Result Value Ref Range    White Blood Cells 6.3 4.5 - 11.0 K/UL    RBC 2.62 (L) 4.0 - 5.0 M/UL    Hemoglobin 8.2 (L) 12.0 - 15.0 GM/DL    Hematocrit 30.8 (L) 36 - 45 %    MCV 97.4 80 - 100 FL    MCH 31.3 26 - 34 PG    MCHC 32.1 32.0 - 36.0 G/DL    RDW 65.7 (H) 11 - 15 %    Platelet Count 187 150 - 400 K/UL    MPV 7.9 7 - 11 FL    Neutrophils 72 41 - 77 %    Lymphocytes 9 (L) 24 - 44 %    Monocytes 13 (H) 4 - 12 %    Eosinophils 6 (H) 0 - 5 %    Basophils 0 0 - 2 %    Absolute Neutrophil Count 4.50 1.8 - 7.0 K/UL    Absolute Lymph Count 0.60 (L) 1.0 - 4.8 K/UL    Absolute Monocyte Count 0.80 0 - 0.80 K/UL    Absolute Eosinophil Count 0.40 0 - 0.45 K/UL    Absolute Basophil Count 0.00 0 - 0.20 K/UL   PTT (APTT)    Collection Time: 03/20/17 11:24 PM   Result Value Ref Range    APTT 26.4 20.0 - 36.0 SEC   CBC    Collection Time: 03/21/17  5:20 AM   Result Value Ref Range    White Blood Cells 7.3 4.5 - 11.0 K/UL    RBC 2.63 (L) 4.0 - 5.0 M/UL    Hemoglobin 8.3 (L) 12.0 - 15.0 GM/DL    Hematocrit 84.6 (L) 36 - 45 %    MCV 97.4 80 - 100 FL    MCH 31.7 26 - 34 PG    MCHC 32.5 32.0 - 36.0 G/DL    RDW 96.2 (H) 11 - 15 %    Platelet Count 193 150 - 400 K/UL    MPV 8.3 7 - 11 FL   BASIC METABOLIC PANEL    Collection Time: 03/21/17  5:20 AM   Result Value Ref Range    Sodium 132 (L) 137 - 147 MMOL/L    Potassium 3.7 3.5 -  5.1 MMOL/L    Chloride 97 (L) 98 - 110 MMOL/L    CO2 26 21 - 30 MMOL/L    Anion Gap 9 3 - 12    Glucose 164 (H) 70 - 100 MG/DL    Blood Urea Nitrogen 56 (H) 7 - 25 MG/DL    Creatinine 1.61 (H) 0.4 - 1.00 MG/DL    Calcium 9.1 8.5 - 09.6 MG/DL    eGFR Non African American 20 (L) >60 mL/min    eGFR African American 25 (L) >60 mL/min   MAGNESIUM    Collection Time: 03/21/17  5:20 AM   Result Value Ref Range    Magnesium 1.7 1.6 - 2.6 mg/dL   PTT (APTT)    Collection Time: 03/21/17  5:20 AM   Result Value Ref Range    APTT 126.3 (H) 20.0 - 36.0 SEC   POC GLUCOSE Collection Time: 03/21/17  6:57 AM   Result Value Ref Range    Glucose, POC 174 (H) 70 - 100 MG/DL   POC GLUCOSE    Collection Time: 03/21/17 11:38 AM   Result Value Ref Range    Glucose, POC 189 (H) 70 - 100 MG/DL       Point of Care Testing  (Last 24 hours)  Glucose: (!) 164 (03/21/17 0520)  POC Glucose (Download): (!) 189 (03/21/17 1138)    Radiology and other Diagnostics Review:    CT head March 20, 2017  No acute intracranial hemorrhage or mass effect.    Chest x-ray March 21, 2017  1. ???Persistent generalized cardiomegaly and persistent widening of the   superior mediastinum with pulmonary vascular congestion and edema.  2. ???Persistent left pleural effusion and left basilar consolidation likely   atelectasis.      Ricard Dillon DO  Internal Medicine PGY 1  Pager [phone]

## 2017-03-21 NOTE — Progress Notes
RT Adult Assessment Note    NAME:Mella DELROSE ROHWER             MRN: [id number]             DOB:05-Mar-1956          AGE: 61 y.o.  ADMISSION DATE: 03/13/2017             DAYS ADMITTED: LOS: 8 days    RT Treatment Plan:       Protocol Plan: Procedures  PEP Therapy: Place a nursing order for "IS Q1h While Awake" for any of Lung Expansion indicators  PAP: Q4h PAP While Awake  Oxygen/Humidity: O2 to keep SpO2 > 92%  Monitoring: Pulse oximetry BID & PRN    Additional Comments:  Impressions of the patient: Somewhat sleepy but able to answer questions, encouraged  to sit HOB up but patient refused. SOA on exertion.  Intervention(s)/outcome(s): NIPPV on s/b at this time, patient vitals stable on 5 L NC  Patient education that was completed: Lung expansion education  Recommendations to the care team: Treat underlying causes, continue plan of care.    Vital Signs:  Pulse:  76  RR:  21  SpO2:  96  O2 Device: $$ O2 Device: Cannula  Liter Flow: O2 Liter Flow: (S) 5 lpm  O2%:    Breath Sounds:    Respiratory Effort: Respiratory Effort: Non-Labored;SOA (Short of Air) on Exertion

## 2017-03-21 NOTE — Progress Notes
Renal Progress Note    Name:  Diane Savage   Diane Savage Date:  03/21/2017  Admission Date: 03/13/2017  LOS: 8 days          Assessment and Plan   Principal Problem:    Atrial fibrillation with rapid ventricular response (HCC)  Active Problems:    Type 2 diabetes mellitus with circulatory disorder, without long-term current use of insulin (HCC)    PAF (paroxysmal atrial fibrillation) (HCC)    Chronic gastrointestinal bleeding    Transfusion-dependent anemia    CAD (coronary artery disease), native coronary artery    HLD (hyperlipidemia)    Hypothyroidism    AKI (acute kidney injury) (HCC)    Essential hypertension    Pleural effusion on left    Left leg cellulitis    Barrett's esophagus    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 female s/p CABG admitted for dyspnea and volume overload.  ???  AKI  -no significant CKD  -renal ultrasound without hydronephrosis  -no recent nephrotoxins  -creatinine progressively increasing  -I&O appear inaccurate  -FeUrea 8% suggesting renal hypoperfusion  ???  Atrial flutter  Leukocytosis  CAD s/p CABG  Anemia  HTN  Hyponatremia, hypervolemic  Encephalopathy, likely related to hypercapnea  ???  Recommendations:  -renal function improving  -patient continues to have signs of volume overload  -continue IV diuresis  -strict I&O, standing daily wieght  -please call with questions or concerns  ???  [name], MD  PGY-5 Nephrology Fellow  Pager [phone]  ???      Subjective     Diane Savage is a 61 y.o. female mental status much improved, requesting ativan for anxiety, no fever, no issues overnight.      Medications     Medications    MEDS    aspirin 81 mg Oral QDAY   cetirizine 10 mg Oral QAM8   diltiazem CD 180 mg Oral QDAY   doxycycline 100 mg Oral BID   folic acid 1 mg Oral QDAY   heparin (porcine) 20-40 Units/kg Intravenous As Prescribed   insulin aspart U-100 0-14 Units Subcutaneous ACHS insulin NPH 18 Units Subcutaneous QDAY(07)   levothyroxine 175 mcg Oral QDAY 30 min before breakfast   magnesium oxide 400 mg Oral BID   melatonin 5 mg Oral QHS   metoprolol tartrate 100 mg Oral BID   nystatin  Topical BID   pantoprazole DR 80 mg Oral QDAY(21)   rosuvastatin 40 mg Oral QDAY   senna/docusate 2 tablet Oral BID   sucralfate 1 g Oral QID    IV MEDS  ??? furosemide (LASIX) 500 mg in 50 mL IV drip syr (max conc) 10 mg/hr (03/21/17 1600)   ??? heparin (porcine) 20,000 Units in dextrose 5% (D5W) 250 mL IV infusion (dbl conc) 1,570 Units/hr (03/21/17 0845)     Prn acetaminophen Q6H PRN 650 mg at 03/20/17 0826, hydrOXYzine TID PRN 25 mg at 03/20/17 2227, nitroglycerin Q5 MIN PRN, ondansetron Q8H PRN 8 mg at 03/17/17 0956, traMADol Q6H PRN 50 mg at 03/20/17 2126           Physical Exam     Vital Signs: Last Filed In 24 Hours Vital Signs: 24 Hour Range   BP: 124/60 (10/19 1500)  Temp: 36.4 ???C (97.6 ???F) (10/19 1600)  Pulse: 78 (10/19 1600)  Respirations: 21 PER MINUTE (10/19 1600)  SpO2: 94 % (  10/19 1600)  O2 Delivery: Nasal Cannula (10/19 1600)  SpO2 Pulse: 78 (10/19 1600) BP: (103-148)/(42-69)   Temp:  [36.4 ???C (97.6 ???F)-37.1 ???C (98.8 ???F)]   Pulse:  [76-92]   Respirations:  [15 PER MINUTE-30 PER MINUTE]   SpO2:  [89 %-98 %]   O2 Delivery: Nasal Cannula   Intensity Pain Scale (Self Report): 6 (03/21/17 0000)        Intake/Output Summary (Last 24 hours) at 03/21/17 1603  Last data filed at 03/21/17 1600   Gross per 24 hour   Intake          1650.93 ml   Output             2400 ml   Net          -749.07 ml      Vitals:    03/20/17 0452 03/21/17 0600 03/21/17 1130   Weight: 111.6 kg (246 lb 0.5 oz) 112.4 kg (247 lb 12.8 oz) 100.5 kg (221 lb 9.6 oz)     ???  Gen: drowsy, mild respiratory distress   HEENT: Sclera normal; MMM   CV:+ JVD, regular rhythm  Pulm: Clear to Auscultation bilateral, diminished  GI: BS+ x4, non-tender to palpation  Neuro: drowsy, moving all ext  Ext: moderate ble edema, no clubbing or cyanosis Skin: no rash, dry      Labs:      Recent Labs      03/19/17   0350  03/19/17   1535  03/20/17   0750  03/20/17   1927  03/21/17   0520   NA  133*  131*  132*  132*  132*   K  4.3  4.4  4.2  4.4  3.7   CL  99  99  98  98  97*   CO2  23  24  24  26  26    GAP  11  8  10  8  9    BUN  48*  48*  50*  53*  56*   CR  3.06*  3.10*  2.97*  2.94*  2.42*   GLU  149*  142*  160*  150*  164*   CA  9.3  9.6  9.6  9.3  9.1   ALBUMIN  3.4*  3.5   --    --    --    MG   --    --   1.7   --   1.7       Recent Labs      03/19/17   0350  03/19/17   1535  03/20/17   0750  03/20/17   2324  03/21/17   0520  03/21/17   1400   WBC  10.9   --   7.2  6.3  7.3   --    HGB  8.9*   --   9.1*  8.2*  8.3*   --    HCT  27.4*   --   28.3*  25.5*  25.6*   --    PLTCT  200   --   196  187  193   --    PTT   --    --    --   26.4  126.3*  88.1*   AST  14  14   --    --    --    --    ALT  5*  6*   --    --    --    --  ALKPHOS  59  60   --    --    --    --       Estimated Creatinine Clearance: 28.5 mL/min (A) (based on SCr of 2.42 mg/dL (H)).  Vitals:    03/20/17 0452 03/21/17 0600 03/21/17 1130   Weight: 111.6 kg (246 lb 0.5 oz) 112.4 kg (247 lb 12.8 oz) 100.5 kg (221 lb 9.6 oz)      Recent Labs      03/19/17   0445  03/19/17   2357   PHART  7.30*  7.27*   PO2ART  76*  76*

## 2017-03-21 NOTE — Progress Notes
Critical Care Progress Note      Today's Date:  03/21/2017  Name:  Diane Savage                       MRN:  [id number]   Admission Date: 03/13/2017  LOS: 8 days                     Assessment/Plan:   Principal Problem:    Atrial fibrillation with rapid ventricular response (HCC)  Active Problems:    Type 2 diabetes mellitus with circulatory disorder, without long-term current use of insulin (HCC)    PAF (paroxysmal atrial fibrillation) (HCC)    Chronic gastrointestinal bleeding    Transfusion-dependent anemia    CAD (coronary artery disease), native coronary artery    HLD (hyperlipidemia)    Hypothyroidism    AKI (acute kidney injury) (HCC)    Essential hypertension    Pleural effusion on left    Left leg cellulitis    Barrett's esophagus    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  She has not been anticoagulated for her atrial fib due to her bleeding issues.  She also had a persistent left pleural effusion which is been present since her bypass surgery. Transferred to CCU for respiratory distress and AMS.    Neuro: PRN pain meds  Cardiac: HFpEF with recent CABG in September. ASA, dilt, metop, statin. Lasix gtt for volume overload with intermittent diuril.   Pulmonary: L pleural effusion with recent pig tail placed by IR. Never fully drained per review of CXRs. Removed on 10/17. Would not attempt to tap again at this point as appears stable. Pt refusing NiPPV despite repeated discussions with her regarding utility. Now requiring 6L NC.  FEN: ADAT  ID:  On doxy for cellulitis through 10/21. Recently switched from Beta Lactam class on 10/15 out of concern for AIN as etiology of worsening renal Fx. Clinically cellulitis much improved. Serum Cr improved this AM  Renal:  AKI on CKD. Renal following. Unclear etiology of worsening renal Fx.  Heme:  Stable. 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 GI scoped recently and felt safe to anticoagulate. Heparin gtt started overnight.  Endo:  Modified [facility] Insulin Protocol, synthroid  Prophylaxis: HOB>40, PPI, Therapeutic heparin gtt  Disposition/Family: Needs ICU for AMS, worsening respiratory status, renal failure.  __________________________________________________________________________________  Subjective:  Diane Savage is a 61 y.o. female.  Overnight Events: No new events noted.  Patient not receptive to plan of care from medical team and continuously mentions the idea of leaving AMA. Refuses NiPPV and foley cathter despite numerous discussions with her about utility in improving her respiratory status. Husband has asked team privately for [place] to help keep his wife in the hospital until she is well enough to go home.    Objective:  Medications:  Scheduled Meds:    aspirin chewable tablet 81 mg 81 mg Oral QDAY   cetirizine (ZYRTEC) tablet 10 mg 10 mg Oral QAM8   diltiazem (cardIZEM) tablet 45 mg 45 mg Oral Q6H   doxycycline (VIBRAMYCIN) tablet  100 mg 100 mg Oral BID   folic acid (FOLVITE) tablet 1 mg 1 mg Oral QDAY   heparin (porcine) BOLUS for continuous inf (bag) [phone] Units 20-40 Units/kg Intravenous As Prescribed   insulin aspart U-100 (NOVOLOG FLEXPEN) injection PEN 0-14 Units 0-14 Units Subcutaneous ACHS   insulin NPH (HUMULIN N KwikPen) injection PEN 18 Units 18 Units Subcutaneous QDAY(07)   levothyroxine (SYNTHROID) tablet 175 mcg 175 mcg Oral QDAY 30 min before breakfast   melatonin tablet 5 mg 5 mg Oral QHS   metoprolol tartrate (LOPRESSOR) tablet 100 mg 100 mg Oral BID   nystatin (NYSTOP) topical powder  Topical BID pantoprazole DR (PROTONIX) tablet 80 mg 80 mg Oral QDAY(21)   rosuvastatin (CRESTOR) tablet 40 mg 40 mg Oral QDAY   senna/docusate (SENOKOT-S) tablet 2 tablet 2 tablet Oral BID   sucralfate (CARAFATE) tablet 1 g 1 g Oral QID   Continuous Infusions:  ??? furosemide (LASIX) 500 mg in 50 mL IV drip syr (max conc) 10 mg/hr (03/21/17 0000)   ??? heparin (porcine) 20,000 units/D5W 500 mL infusion (std conc)(premade) Stopped (03/21/17 0652)     PRN and Respiratory Meds:acetaminophen Q6H PRN, hydrOXYzine TID PRN, nitroglycerin Q5 MIN PRN, ondansetron Q8H PRN, traMADol Q6H PRN                     Vital Signs: Last Filed                  Vital Signs: 24 Hour Range   BP: 103/42 (10/19 0600)  Temp: 37.1 ???C (98.8 ???F) (10/19 0400)  Pulse: 79 (10/19 0600)  Respirations: 21 PER MINUTE (10/19 0600)  SpO2: 93 % (10/19 0600)  O2 Delivery: Nasal Cannula (10/19 0600)  BP: (103-148)/(42-78)   Temp:  [36.5 ???C (97.7 ???F)-37.1 ???C (98.8 ???F)]   Pulse:  [70-92]   Respirations:  [15 PER MINUTE-28 PER MINUTE]   SpO2:  [89 %-99 %]   O2 Delivery: Nasal Cannula    Intensity Pain Scale (Self Report): (not recorded) Vitals:    03/19/17 0500 03/19/17 1157 03/20/17 0452   Weight: 113 kg (249 lb 1.9 oz) 112.9 kg (249 lb) 111.6 kg (246 lb 0.5 oz)       Critical Care Vitals:      ICP Monitoring:     PA  Catheter:     Hemodynamics/Oxycalcs:       Intake/Output Summary:  (Last 24 hours)    Intake/Output Summary (Last 24 hours) at 03/21/17 0716  Last data filed at 03/21/17 0600   Gross per 24 hour   Intake             1394 ml   Output             1430 ml   Net              -36 ml         Physical Exam:    General:  Mild distress, Appears stated age  Lungs:  Crackles bilaterally  CV: RRR  Abdomen:  Soft, non-tender.  Bowel sounds normal.  No masses.  No organomegaly.  Extremities:  Extremities normal, atraumatic, no cyanosis or edema  Neurologic:  CNII - XII intact.  PERRL      Prophylaxis Review:  Lines:  No  Urinary Catheter:  No Antibiotic Usage:  Yes; Infection present or suspected:  Wound/Skin/Tissue;  Cellulitis  VTE: Per Primary    Lab Review:  Pertinent labs reviewed  Point of  Care Testing:  (Last 24 hours):  Glucose: (!) 164 (03/21/17 0520)  POC Glucose (Download): (!) 174 (03/21/17 1610)    Radiology and Other Diagnostic Procedures Review:    Pertinent radiology reviewed.    I have seen, examined and reviewed data concerning this patient.  I discussed the findings and plan of care with the ICU team. I spent 46 minutes in critical care time, excluding procedures today.    Caralee Ates, MD  Assistant Professor  Anesthesiology/Critical Care Medicine  Pager: [phone]

## 2017-03-21 NOTE — Progress Notes
When Rn in pt.'s room passing medications. pt. described to Rn how she couldn't keep doing this. When questioned about the route cause of her distress, the pt. verbalized, "I just need some ativan or I'm leaving now." When consulted with medical team and medications reviewed, RN discussed medication changes with pt. Pt. refused one medication. Continuing to monitor.

## 2017-03-22 DIAGNOSIS — I4891 Unspecified atrial fibrillation: Secondary | ICD-10-CM

## 2017-03-22 LAB — PTT (APTT)
Lab: 85 s — ABNORMAL HIGH (ref 20.0–36.0)
Lab: 96 s — ABNORMAL HIGH (ref 20.0–36.0)

## 2017-03-22 LAB — POC GLUCOSE
Lab: 192 mg/dL — ABNORMAL HIGH (ref 70–100)
Lab: 212 mg/dL — ABNORMAL HIGH (ref 70–100)
Lab: 185 mg/dL — ABNORMAL HIGH (ref 70–100)
Lab: 180 mg/dL — ABNORMAL HIGH (ref 70–100)

## 2017-03-22 LAB — BASIC METABOLIC PANEL
Lab: 1.9 mg/dL — ABNORMAL HIGH (ref 0.4–1.00)
Lab: 133 MMOL/L — ABNORMAL LOW (ref 137–147)
Lab: 179 mg/dL — ABNORMAL HIGH (ref 70–100)
Lab: 27 mL/min — ABNORMAL LOW (ref >60)
Lab: 28 MMOL/L (ref 21–30)
Lab: 32 mL/min — ABNORMAL LOW (ref >60)
Lab: 54 mg/dL — ABNORMAL HIGH (ref 7–25)
Lab: 8 pg (ref 3–12)
Lab: 9.1 mg/dL (ref 8.5–10.6)
Lab: 97 MMOL/L — ABNORMAL LOW (ref 98–110)

## 2017-03-22 LAB — CBC: Lab: 5.1 10*3/uL (ref 4.5–11.0)

## 2017-03-22 LAB — MAGNESIUM: Lab: 1.8 mg/dL — ABNORMAL LOW (ref 1.6–2.6)

## 2017-03-22 MED ORDER — CHLOROTHIAZIDE SODIUM 500 MG IV SOLR
500 mg | Freq: Once | INTRAVENOUS | 0 refills | Status: CP
Start: 2017-03-22 — End: ?
  Administered 2017-03-22: 15:00:00 500 mg via INTRAVENOUS

## 2017-03-22 MED ORDER — WARFARIN 2.5 MG PO TAB
2.5 mg | Freq: Every evening | ORAL | 0 refills | Status: DC
Start: 2017-03-22 — End: 2017-03-25
  Administered 2017-03-23 – 2017-03-24 (×2): 2.5 mg via ORAL

## 2017-03-22 MED ORDER — WARFARIN PHARMACY TO MANAGE
1 | 0 refills | Status: DC
Start: 2017-03-22 — End: 2017-03-25

## 2017-03-22 MED ORDER — POTASSIUM CHLORIDE 20 MEQ PO TBTQ
60 meq | Freq: Once | ORAL | 0 refills | Status: CP
Start: 2017-03-22 — End: ?
  Administered 2017-03-22: 14:00:00 60 meq via ORAL

## 2017-03-22 MED ORDER — WARFARIN 2.5 MG PO TAB
2.5 mg | Freq: Every evening | ORAL | 0 refills | Status: DC
Start: 2017-03-22 — End: 2017-03-22

## 2017-03-22 NOTE — Progress Notes
Critical Care Progress Note      Today's Date:  03/22/2017  Name:  Diane Savage                       MRN:  [id number]   Admission Date: 03/13/2017  LOS: 9 days                     Assessment/Plan:   Principal Problem:    Atrial fibrillation with rapid ventricular response (HCC)  Active Problems:    Type 2 diabetes mellitus with circulatory disorder, without long-term current use of insulin (HCC)    PAF (paroxysmal atrial fibrillation) (HCC)    Chronic gastrointestinal bleeding    Transfusion-dependent anemia    CAD (coronary artery disease), native coronary artery    HLD (hyperlipidemia)    Hypothyroidism    AKI (acute kidney injury) (HCC)    Essential hypertension    Pleural effusion on left    Left leg cellulitis    Barrett's esophagus    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  She has not been anticoagulated for her atrial fib due to her bleeding issues.  She also had a persistent left pleural effusion which is been present since her bypass surgery. Transferred to CCU for respiratory distress and AMS.    Neuro: PRN pain meds  Cardiac: HFpEF with recent CABG in September. ASA, dilt, metop, statin. Lasix gtt for volume overload with intermittent diuril. Volume status appears to be improving.  Pulmonary: L pleural effusion with recent pig tail placed by IR. Never fully drained per review of CXRs. Removed on 10/17. Would not attempt to tap again at this point as appears stable. Pt refusing NiPPV despite repeated discussions with her regarding utility. Now weaned to 3L NC  FEN: ADAT  ID:  On doxy for cellulitis through 10/21. Recently switched from Beta Lactam class on 10/15 out of concern for AIN as etiology of worsening renal Fx. Clinically cellulitis much improved.  Renal:  AKI on CKD. Renal following. SCr improving at this time.  Heme:  Stable. 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 GI scoped recently and felt safe to anticoagulate. Heparin gtt  Endo:  Modified [facility] Insulin Protocol, synthroid  Prophylaxis: HOB>40, PPI, Therapeutic heparin gtt, Start Coumadin tonight  Disposition/Family: Needs ICU for AMS, worsened respiratory status, renal failure.  __________________________________________________________________________________  Subjective:  [name] is a 61 y.o. female.  Overnight Events: No new events noted.  Patient sitting up in a chair this AM. POC discussed with patient and her husband at the bedside on AM rounds.    Objective:  Medications:  Scheduled Meds:    aspirin chewable tablet 81 mg 81 mg Oral QDAY   cetirizine (ZYRTEC) tablet 10 mg 10 mg Oral QAM8   diltiazem CD (cardIZEM CD) capsule 180 mg 180 mg Oral QDAY   doxycycline (VIBRAMYCIN) tablet 100 mg 100 mg Oral BID   folic acid (FOLVITE) tablet 1 mg 1 mg Oral QDAY   heparin (porcine) injection 2,250-4,500 Units 20-40 Units/kg Intravenous As Prescribed   insulin aspart  U-100 (NOVOLOG FLEXPEN) injection PEN 0-14 Units 0-14 Units Subcutaneous ACHS   insulin NPH (HUMULIN N KwikPen) injection PEN 18 Units 18 Units Subcutaneous QDAY(07)   levothyroxine (SYNTHROID) tablet 175 mcg 175 mcg Oral QDAY 30 min before breakfast   magnesium oxide (MAG-OX) tablet 400 mg 400 mg Oral BID   melatonin tablet 5 mg 5 mg Oral QHS   metoprolol tartrate (LOPRESSOR) tablet 100 mg 100 mg Oral BID   nystatin (NYSTOP) topical powder  Topical BID   pantoprazole DR (PROTONIX) tablet 80 mg 80 mg Oral QDAY(21)   potassium chloride SR (K-DUR) tablet 60 mEq 60 mEq Oral ONCE   rosuvastatin (CRESTOR) tablet 40 mg 40 mg Oral QDAY   senna/docusate (SENOKOT-S) tablet 2 tablet 2 tablet Oral BID sucralfate (CARAFATE) tablet 1 g 1 g Oral QID   Continuous Infusions:  ??? furosemide (LASIX) 500 mg in 50 mL IV drip syr (max conc) 10 mg/hr (03/22/17 0400)   ??? heparin (porcine) 20,000 Units in dextrose 5% (D5W) 250 mL IV infusion (dbl conc) 1,570 Units/hr (03/22/17 0714)     PRN and Respiratory Meds:acetaminophen Q6H PRN, hydrOXYzine TID PRN, nitroglycerin Q5 MIN PRN, ondansetron Q8H PRN, traMADol Q6H PRN                     Vital Signs: Last Filed                  Vital Signs: 24 Hour Range   BP: 122/51 (10/20 0700)  Temp: 36.4 ???C (97.5 ???F) (10/20 0400)  Pulse: 75 (10/20 0700)  Respirations: 26 PER MINUTE (10/20 0600)  SpO2: 92 % (10/20 0700)  O2 Delivery: Nasal Cannula (10/20 0700)  Weight: 109.1 kg (240 lb 9.6 oz) (10/20 0500)  BP: (106-148)/(40-87)   Temp:  [36.4 ???C (97.5 ???F)-36.9 ???C (98.4 ???F)]   Pulse:  [75-87]   Respirations:  [18 PER MINUTE-30 PER MINUTE]   SpO2:  [92 %-98 %]   O2 Delivery: Nasal Cannula    Intensity Pain Scale (Self Report): (not recorded) Vitals:    03/20/17 0452 03/21/17 0600 03/22/17 0500   Weight: 111.6 kg (246 lb 0.5 oz) 112.4 kg (247 lb 12.8 oz) 109.1 kg (240 lb 9.6 oz)       Critical Care Vitals:      ICP Monitoring:     PA  Catheter:     Hemodynamics/Oxycalcs:       Intake/Output Summary:  (Last 24 hours)    Intake/Output Summary (Last 24 hours) at 03/22/17 0725  Last data filed at 03/22/17 0700   Gross per 24 hour   Intake           730.89 ml   Output             2600 ml   Net         -1869.11 ml         Physical Exam:    General:  Mild distress, Appears stated age  Lungs:  Crackles bilaterally  CV: RRR  Abdomen:  Soft, non-tender.  Bowel sounds normal.  No masses.  No organomegaly.  Extremities:  Extremities normal, atraumatic, no cyanosis or edema  Neurologic:  CNII - XII intact.  PERRL      Prophylaxis Review:  Lines:  No  Urinary Catheter:  No  Antibiotic Usage:  Yes; Infection present or suspected:  Wound/Skin/Tissue;  Cellulitis  VTE: Per Primary Lab Review:  Pertinent labs reviewed  Point of Care Testing:  (Last  24 hours):  Glucose: (!) 179 (03/22/17 0424)  POC Glucose (Download): (!) 212 (03/22/17 1610)    Radiology and Other Diagnostic Procedures Review:    Pertinent radiology reviewed.    I have seen, examined and reviewed data concerning this patient.  I discussed the findings and plan of care with the ICU team. I spent 45 minutes in critical care time, excluding procedures today.    Caralee Ates, MD  Assistant Professor  Anesthesiology/Critical Care Medicine  Pager: [phone]

## 2017-03-22 NOTE — Progress Notes
General Progress Note    Name:  Diane Savage   [id number] Date:  03/22/2017  Admission Date: 03/13/2017  LOS: 9 days                     Assessment/Plan:    Principal Problem:    Atrial fibrillation with rapid ventricular response (HCC)  Active Problems:    Type 2 diabetes mellitus with circulatory disorder, without long-term current use of insulin (HCC)    PAF (paroxysmal atrial fibrillation) (HCC)    Chronic gastrointestinal bleeding    Transfusion-dependent anemia    CAD (coronary artery disease), native coronary artery    HLD (hyperlipidemia)    Hypothyroidism    AKI (acute kidney injury) (HCC)    Essential hypertension    Pleural effusion on left    Left leg cellulitis    Barrett's esophagus    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 female, the patient has a past medical history of Acquired hypothyroidism; Arthritis; Back pain; Bleeding disorder (HCC); Coronary artery disease; Diabetes mellitus (HCC); Hypertension; Stomach disorder; and Vision decreased. presenting with shortness of breath, dizziness/lightheadedness, chills, nausea, lower extremity swelling and palpitations.  ???  1. Atrial fluter, not on AC  - has had intermittent atrial flutter since CABG  - not on AC due to history of GIB  - has had palpitations for about 1 week prior to presentation, no CP or syncope  - EKG on admission: atrial fibrillation, rate 98, no ST changes  - GI and CTS consulted to discuss effusion and AC in setting of bleeding history   - EGD showing 3 columns of esophageal varices, mild portal hypertensive gastropathy without bleeding, gave without bleeding and gastric antrum, mild, and possible Barrett's esophagus, no biopsies taken  - Rates controlled on dilt and metoprolol 100 mg BID               Plan for today:               > Continue ASA and Metoprolol   > diltiazem 45mg  Q6hr, rates controlled    > change to Diltiazem 180 mg daily > on heparin drip   > starting warfarin 2.5mg  tonight, monitor INR    ???  2. Shortness of Breath/Lower extremity swelling, concern for DVT, volume overload  - LLE swelling greater than right since surgery  - has history of DVT  - BNP 327 on admission   - CXR with persistent left sided pleural effusion   - IR drain and Chest tube on left side  -CTS okay with anticoagulation while chest tube is in place  -Chest x-ray worse overnight showing increased pulmonary vascular congestion  -Net even over last 24 hours despite aggressive diuretic use  -I/O not accurate  -Refusing BiPAP, 3L nasal canula now  Plan for today:   > Continue Lasix drip 10mg /hr and intermittent boluses   > Encourage BiPAP   > 500 diuril today    3.  Acute kidney injury, improving  -FE urea 8.4%, consistent with prerenal etiology  -Improving with diuresis, consistent with cardiorenal syndrome  -Renal ultrasound unremarkable: Urine output, oliguria  -Changed amoxicillin to doxycycline  -Urine eosinophils negative  Plan:   > Avoid nephrotoxic medications   > Nephrology consulted   > continue diuresis    4.  Altered mental status, improved  -Hypercapnia versus uremia versus infectious  -Has been  lethargic and confused for the past 3 days now  -Wanted to leave AMA multiple times  Plan:   >Diuresis with lasix drip   >on doxycycline for LLE cellulitis    5.  Cellulitis of lower extremity, left  - Amoxicillin started on 10/12, end 10/15  Plan:   >continue doxycycline, end 03/23/17   ???  6. 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  7. Anemia, normocytic, chronic blood loss, not due to postoperative complications, stable  -Chronic secondary to blood loss  -EGD showing 3 columns of esophageal varices, mild portal hypertensive gastropathy without bleeding, G AVE without bleeding in gastric antrum, mild, and possible Barrett's esophagus, no biopsies taken  - has had extensive work up and evaluation by GI  - frequent upper/lower GIB  - requires frequent blood transfusions, last transfusion 10/12  -JKA antibodies               Plan for today:                > monitor    8. Hypothyroidism  > Continue Synthroid  ???  9. HLD  > Continue PTA statin  ???  10. HTN  > Continue metoprolol  ???  10. DMII   - PTA regimen: NPH 18u QD, Novolog 4u TID w/ meals  > Continue PTA NPH, add on mid dose correction factor  ???  Fluids, electrolytes and Nutrition:  IVF: no IVF  Electrolytes: Monitor and replace PRN.  Diet:  Cardiac Diet  ???  Prophylaxis:  DVT: SCD  Stress ulcer: PPI  ???  Code status: Full Code  Disposition: contiue on CV1, continue ICU management  ???  Patient discussed with Dr. Duane Lope.    Ricard Dillon DO  Internal Medicine PGY-1   Pager: [phone]    ________________________________________________________________________    Subjective  Patient seen and examined this morning.  No acute events overnight.  Received atarax this morning and was lethargic during my interview.  Was more interactive later on in the morning.  Still seemed to have some labored breathing.    Medications  Scheduled Meds:    aspirin chewable tablet 81 mg 81 mg Oral QDAY   cetirizine (ZYRTEC) tablet 10 mg 10 mg Oral QAM8   diltiazem CD (cardIZEM CD) capsule 180 mg 180 mg Oral QDAY   doxycycline (VIBRAMYCIN) tablet 100 mg 100 mg Oral BID   folic acid (FOLVITE) tablet 1 mg 1 mg Oral QDAY   heparin (porcine) injection 2,250-4,500 Units 20-40 Units/kg Intravenous As Prescribed   insulin aspart U-100 (NOVOLOG FLEXPEN) injection PEN 0-14 Units 0-14 Units Subcutaneous ACHS   insulin NPH (HUMULIN N KwikPen) injection PEN 18 Units 18 Units Subcutaneous QDAY(07)   levothyroxine (SYNTHROID) tablet 175 mcg 175 mcg Oral QDAY 30 min before breakfast   magnesium oxide (MAG-OX) tablet 400 mg 400 mg Oral BID   melatonin tablet 5 mg 5 mg Oral QHS metoprolol tartrate (LOPRESSOR) tablet 100 mg 100 mg Oral BID   nystatin (NYSTOP) topical powder  Topical BID   pantoprazole DR (PROTONIX) tablet 80 mg 80 mg Oral QDAY(21)  rosuvastatin (CRESTOR) tablet 40 mg 40 mg Oral QDAY   senna/docusate (SENOKOT-S) tablet 2 tablet 2 tablet Oral BID   sucralfate (CARAFATE) tablet 1 g 1 g Oral QID   warfarin (COUMADIN) tablet 2.5 mg 2.5 mg Oral QHS   Continuous Infusions:  ??? furosemide (LASIX) 500 mg in 50 mL IV drip syr (max conc) 10 mg/hr (03/22/17 1200)   ??? heparin (porcine) 20,000 Units in dextrose 5% (D5W) 250 mL IV infusion (dbl conc) 1,570 Units/hr (03/22/17 1151)     PRN and Respiratory Meds:acetaminophen Q6H PRN, hydrOXYzine TID PRN, nitroglycerin Q5 MIN PRN, ondansetron Q8H PRN, traMADol Q6H PRN      Review of Systems:  A 14 point review of systems was negative except for: Discussed in subjective    Objective:                          Vital Signs: Last Filed                 Vital Signs: 24 Hour Range   BP: 119/55 (10/20 1200)  Temp: 36.4 ???C (97.6 ???F) (10/20 1200)  Pulse: 75 (10/20 1200)  Respirations: 15 PER MINUTE (10/20 1200)  SpO2: 99 % (10/20 1200)  O2 Delivery: Nasal Cannula (10/20 1200)  SpO2 Pulse: 75 (10/20 1200) BP: (106-148)/(40-87)   Temp:  [36.3 ???C (97.4 ???F)-36.7 ???C (98 ???F)]   Pulse:  [71-84]   Respirations:  [15 PER MINUTE-26 PER MINUTE]   SpO2:  [92 %-99 %]   O2 Delivery: Nasal Cannula   Intensity Pain Scale (Self Report): 7 (03/22/17 0400) Vitals:    03/20/17 0452 03/21/17 0600 03/22/17 0500   Weight: 111.6 kg (246 lb 0.5 oz) 112.4 kg (247 lb 12.8 oz) 109.1 kg (240 lb 9.6 oz)       Intake/Output Summary:  (Last 24 hours)    Intake/Output Summary (Last 24 hours) at 03/22/17 1329  Last data filed at 03/22/17 1300   Gross per 24 hour   Intake            494.4 ml   Output             2425 ml   Net          -1930.6 ml      Stool Occurrence: 1    Physical Exam  General:  lethargic  Eyes:  Conjunctivae/corneas clear.  EOMI bilaterally, no conjunctival injection  Lungs:  Crackles throughout lung fields  Heart:   Regular rhythm, tachycardic, S1, S2 normal, no appreciable murmur  Abdomen:  Soft, non-tender.  Bowel sounds normal.  No masses.  No organomegaly.  Extremities: Bilateral lower extremities.  2+ pitting edema bilaterally, mildly worse on the left side, there is a scar assumably where the vein graft was harvested, erythema improved, without any purulence noted  Skin: Scar on left leg from saphenous vein graft without purulence, there is erythema, warmth is equal bilaterally  Neurologic:   CNII - XII intact.  Normal strength, sensation and reflexes throughout.  Psych: lethargic    Lab Review  24-hour labs:    Results for orders placed or performed during the hospital encounter of 03/13/17 (from the past 24 hour(s))   PTT (APTT)    Collection Time: 03/21/17  2:00 PM   Result Value Ref Range    APTT 88.1 (H) 20.0 - 36.0 SEC   POC GLUCOSE    Collection Time: 03/21/17  5:15 PM   Result Value  Ref Range    Glucose, POC 170 (H) 70 - 100 MG/DL   POC GLUCOSE    Collection Time: 03/21/17  8:52 PM   Result Value Ref Range    Glucose, POC 192 (H) 70 - 100 MG/DL   PTT (APTT)    Collection Time: 03/21/17  9:04 PM   Result Value Ref Range    APTT 85.7 (H) 20.0 - 36.0 SEC   CBC    Collection Time: 03/22/17  4:24 AM   Result Value Ref Range    White Blood Cells 5.1 4.5 - 11.0 K/UL    RBC 2.53 (L) 4.0 - 5.0 M/UL    Hemoglobin 8.0 (L) 12.0 - 15.0 GM/DL    Hematocrit 91.4 (L) 36 - 45 %    MCV 97.2 80 - 100 FL    MCH 31.6 26 - 34 PG    MCHC 32.5 32.0 - 36.0 G/DL    RDW 78.2 (H) 11 - 15 %    Platelet Count 158 150 - 400 K/UL    MPV 8.4 7 - 11 FL   BASIC METABOLIC PANEL    Collection Time: 03/22/17  4:24 AM   Result Value Ref Range    Sodium 133 (L) 137 - 147 MMOL/L    Potassium 3.5 3.5 - 5.1 MMOL/L    Chloride 97 (L) 98 - 110 MMOL/L    CO2 28 21 - 30 MMOL/L    Anion Gap 8 3 - 12    Glucose 179 (H) 70 - 100 MG/DL    Blood Urea Nitrogen 54 (H) 7 - 25 MG/DL Creatinine 9.56 (H) 0.4 - 1.00 MG/DL    Calcium 9.1 8.5 - 21.3 MG/DL    eGFR Non African American 27 (L) >60 mL/min    eGFR African American 32 (L) >60 mL/min   MAGNESIUM    Collection Time: 03/22/17  4:24 AM   Result Value Ref Range    Magnesium 1.8 1.6 - 2.6 mg/dL   PTT (APTT)    Collection Time: 03/22/17  4:24 AM   Result Value Ref Range    APTT 96.4 (H) 20.0 - 36.0 SEC   POC GLUCOSE    Collection Time: 03/22/17  6:24 AM   Result Value Ref Range    Glucose, POC 212 (H) 70 - 100 MG/DL   POC GLUCOSE    Collection Time: 03/22/17 11:46 AM   Result Value Ref Range    Glucose, POC 185 (H) 70 - 100 MG/DL       Point of Care Testing  (Last 24 hours)  Glucose: (!) 179 (03/22/17 0424)  POC Glucose (Download): (!) 185 (03/22/17 1146)    Radiology and other Diagnostics Review:    CT head March 20, 2017  No acute intracranial hemorrhage or mass effect.    Chest x-ray March 22, 2017  Impression: No significant change in appearance of chest with stable   postoperative mediastinal configuration, cardiomegaly, left pleural   effusion with associated left lung consolidation as described. Stable mild   central venous congestion.      Ricard Dillon DO  Internal Medicine PGY 1  Pager [phone]

## 2017-03-22 NOTE — Progress Notes
Renal Progress Note    Name:  Diane Savage   [id number]'U Date:  03/22/2017  Admission Date: 03/13/2017  LOS: 9 days          Assessment and Plan   Principal Problem:    Atrial fibrillation with rapid ventricular response (HCC)  Active Problems:    Type 2 diabetes mellitus with circulatory disorder, without long-term current use of insulin (HCC)    PAF (paroxysmal atrial fibrillation) (HCC)    Chronic gastrointestinal bleeding    Transfusion-dependent anemia    CAD (coronary artery disease), native coronary artery    HLD (hyperlipidemia)    Hypothyroidism    AKI (acute kidney injury) (HCC)    Essential hypertension    Pleural effusion on left    Left leg cellulitis    Barrett's esophagus    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 female s/p CABG admitted for dyspnea and volume overload.  ???  AKI  -no significant CKD  -renal ultrasound without hydronephrosis  -no recent nephrotoxins  -creatinine progressively increasing  -I&O appear inaccurate  -FeUrea 8% suggesting renal hypoperfusion  ???  Atrial flutter  Leukocytosis  CAD s/p CABG  Anemia  HTN  Hyponatremia, hypervolemic  Encephalopathy, likely related to hypercapnea  ???  Recommendations:  -renal function continues to improve  -CXR from today reviewed, pulmonary edema persistent  -continue IV diuresis  -strict I&O, standing daily wieght  -please call with questions or concerns  ???  [name], MD  PGY-5 Nephrology Fellow  Pager [phone]  ???      Subjective     Diane Savage is a 61 y.o. female no issues overnight, diuresing well, remains on oxygen, edema improving.      Medications     Medications    MEDS    aspirin 81 mg Oral QDAY   cetirizine 10 mg Oral QAM8   diltiazem CD 180 mg Oral QDAY   doxycycline 100 mg Oral BID   folic acid 1 mg Oral QDAY   heparin (porcine) 20-40 Units/kg Intravenous As Prescribed   insulin aspart U-100 0-14 Units Subcutaneous ACHS insulin NPH 18 Units Subcutaneous QDAY(07)   levothyroxine 175 mcg Oral QDAY 30 min before breakfast   magnesium oxide 400 mg Oral BID   melatonin 5 mg Oral QHS   metoprolol tartrate 100 mg Oral BID   nystatin  Topical BID   pantoprazole DR 80 mg Oral QDAY(21)   rosuvastatin 40 mg Oral QDAY   senna/docusate 2 tablet Oral BID   sucralfate 1 g Oral QID   warfarin 2.5 mg Oral QHS    IV MEDS  ??? furosemide (LASIX) 500 mg in 50 mL IV drip syr (max conc) 10 mg/hr (03/22/17 1600)   ??? heparin (porcine) 20,000 Units in dextrose 5% (D5W) 250 mL IV infusion (dbl conc) 1,570 Units/hr (03/22/17 1151)     Prn acetaminophen Q6H PRN 650 mg at 03/20/17 0826, hydrOXYzine TID PRN 25 mg at 03/22/17 0233, nitroglycerin Q5 MIN PRN, ondansetron Q8H PRN 8 mg at 03/17/17 0956, traMADol Q6H PRN 50 mg at 03/20/17 2126, warfarin QHS **AND** warfarin, pharmacy to manage Per Pharmacy           Physical Exam     Vital Signs: Last Filed In 24 Hours Vital Signs: 24 Hour Range   BP: 135/60 (10/20 1600)  Temp: 36.4 ???C (97.6 ???F) (10/20 1600)  Pulse:  79 (10/20 1600)  Respirations: 22 PER MINUTE (10/20 1600)  SpO2: 98 % (10/20 1600)  O2 Delivery: Nasal Cannula (10/20 1200)  SpO2 Pulse: 79 (10/20 1600) BP: (106-148)/(40-87)   Temp:  [36.3 ???C (97.4 ???F)-36.7 ???C (98 ???F)]   Pulse:  [71-84]   Respirations:  [15 PER MINUTE-26 PER MINUTE]   SpO2:  [92 %-99 %]   O2 Delivery: Nasal Cannula   Intensity Pain Scale (Self Report): 7 (03/22/17 0400)        Intake/Output Summary (Last 24 hours) at 03/22/17 1725  Last data filed at 03/22/17 1700   Gross per 24 hour   Intake            614.4 ml   Output             2575 ml   Net          -1960.6 ml      Vitals:    03/20/17 0452 03/21/17 0600 03/22/17 0500   Weight: 111.6 kg (246 lb 0.5 oz) 112.4 kg (247 lb 12.8 oz) 109.1 kg (240 lb 9.6 oz)     ???  Gen: drowsy, mild respiratory distress   HEENT: Sclera normal; MMM   CV:+ JVD, regular rhythm  Pulm: Clear to Auscultation bilateral, diminished GI: BS+ x4, non-tender to palpation  Neuro: drowsy, moving all ext  Ext: moderate ble edema, no clubbing or cyanosis   Skin: no rash, dry      Labs:      Recent Labs      03/20/17   0750  03/20/17   1927  03/21/17   0520  03/22/17   0424   NA  132*  132*  132*  133*   K  4.2  4.4  3.7  3.5   CL  98  98  97*  97*   CO2  24  26  26  28    GAP  10  8  9  8    BUN  50*  53*  56*  54*   CR  2.97*  2.94*  2.42*  1.91*   GLU  160*  150*  164*  179*   CA  9.6  9.3  9.1  9.1   MG  1.7   --   1.7  1.8       Recent Labs      03/20/17   0750  03/20/17   2324  03/21/17   0520  03/21/17   1400  03/21/17   2104  03/22/17   0424   WBC  7.2  6.3  7.3   --    --   5.1   HGB  9.1*  8.2*  8.3*   --    --   8.0*   HCT  28.3*  25.5*  25.6*   --    --   24.6*   PLTCT  196  187  193   --    --   158   PTT   --   26.4  126.3*  88.1*  85.7*  96.4*      Estimated Creatinine Clearance: 37.8 mL/min (A) (based on SCr of 1.91 mg/dL (H)).  Vitals:    03/20/17 0452 03/21/17 0600 03/22/17 0500   Weight: 111.6 kg (246 lb 0.5 oz) 112.4 kg (247 lb 12.8 oz) 109.1 kg (240 lb 9.6 oz)      Recent Labs      03/19/17   2357   PHART  7.27*   PO2ART  76*

## 2017-03-23 DIAGNOSIS — I4891 Unspecified atrial fibrillation: Secondary | ICD-10-CM

## 2017-03-23 LAB — CBC: Lab: 4.6 10*3/uL (ref 4.5–11.0)

## 2017-03-23 LAB — BASIC METABOLIC PANEL
Lab: 1.5 mg/dL — ABNORMAL HIGH (ref 0.4–1.00)
Lab: 133 mg/dL — ABNORMAL HIGH (ref 70–100)
Lab: 137 MMOL/L — ABNORMAL LOW (ref 137–147)
Lab: 33 MMOL/L — ABNORMAL HIGH (ref 21–30)
Lab: 35 mL/min — ABNORMAL LOW (ref >60)
Lab: 43 mL/min — ABNORMAL LOW (ref >60)
Lab: 50 mg/dL — ABNORMAL HIGH (ref 7–25)
Lab: 6 pg (ref 3–12)
Lab: 9.4 mg/dL (ref 8.5–10.6)
Lab: 98 MMOL/L — ABNORMAL LOW (ref 98–110)

## 2017-03-23 LAB — POC GLUCOSE
Lab: 127 mg/dL — ABNORMAL HIGH (ref 70–100)
Lab: 133 mg/dL — ABNORMAL HIGH (ref 70–100)
Lab: 138 mg/dL — ABNORMAL HIGH (ref 70–100)
Lab: 150 mg/dL — ABNORMAL HIGH (ref 70–100)

## 2017-03-23 LAB — MAGNESIUM: Lab: 1.7 mg/dL — ABNORMAL LOW (ref 1.6–2.6)

## 2017-03-23 LAB — PTT (APTT): Lab: 105 s — ABNORMAL HIGH (ref 20.0–36.0)

## 2017-03-23 LAB — PROTIME INR (PT): Lab: 1.4 MMOL/L — ABNORMAL HIGH (ref 0.8–1.2)

## 2017-03-23 LAB — HEMOGLOBIN & HEMATOCRIT
Lab: 24 % — ABNORMAL LOW (ref 36–45)
Lab: 8.1 g/dL — ABNORMAL LOW (ref 12.0–15.0)

## 2017-03-23 MED ORDER — MAGNESIUM SULFATE IN WATER 4 GRAM/50 ML (8 %) IV PGBK
2 g | Freq: Once | INTRAVENOUS | 0 refills | Status: CP
Start: 2017-03-23 — End: ?
  Administered 2017-03-23: 18:00:00 2 g via INTRAVENOUS

## 2017-03-23 MED ORDER — POTASSIUM CHLORIDE 20 MEQ PO TBTQ
60 meq | Freq: Once | ORAL | 0 refills | Status: CP
Start: 2017-03-23 — End: ?
  Administered 2017-03-23: 13:00:00 60 meq via ORAL

## 2017-03-23 MED ORDER — CALCIUM CARBONATE 200 MG CALCIUM (500 MG) PO CHEW
500 mg | Freq: Once | ORAL | 0 refills | Status: CP
Start: 2017-03-23 — End: ?
  Administered 2017-03-23: 09:00:00 500 mg via ORAL

## 2017-03-23 NOTE — Progress Notes
Patient arrived on unit via wheelchair accompanied by RN. Patient transferred to the bed with assistance x 2. Assessment completed, refer to flowsheet for details. Heparin, lasix, and magnesium sulfate infusing; see eMAR for details. Oriented to surroundings, call light within reach. Plan of care reviewed.  Will continue to monitor and assess.

## 2017-03-23 NOTE — Progress Notes
General Progress Note    Name:  Diane Savage   ZOXWR'U Date:  03/23/2017  Admission Date: 03/13/2017  LOS: 10 days                     Assessment/Plan:    Principal Problem:    Atrial fibrillation with rapid ventricular response (HCC)  Active Problems:    Type 2 diabetes mellitus with circulatory disorder, without long-term current use of insulin (HCC)    PAF (paroxysmal atrial fibrillation) (HCC)    Chronic gastrointestinal bleeding    Transfusion-dependent anemia    CAD (coronary artery disease), native coronary artery    HLD (hyperlipidemia)    Hypothyroidism    AKI (acute kidney injury) (HCC)    Essential hypertension    Pleural effusion on left    Left leg cellulitis    Barrett's esophagus    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 female, the patient has a past medical history of Acquired hypothyroidism; Arthritis; Back pain; Bleeding disorder (HCC); Coronary artery disease; Diabetes mellitus (HCC); Hypertension; Stomach disorder; and Vision decreased. presenting with shortness of breath, dizziness/lightheadedness, chills, nausea, lower extremity swelling and palpitations.  ???  1. Atrial fluter, not on AC  - has had intermittent atrial flutter since CABG  - not on AC due to history of GIB  - has had palpitations for about 1 week prior to presentation, no CP or syncope  - EKG on admission: atrial fibrillation, rate 98, no ST changes  - GI and CTS consulted to discuss effusion and AC in setting of bleeding history   - EGD showing 3 columns of esophageal varices, mild portal hypertensive gastropathy without bleeding, gave without bleeding and gastric antrum, mild, and possible Barrett's esophagus, no biopsies taken  - Rates controlled on dilt and metoprolol 100 mg BID               Plan for today:               > Continue ASA and Metoprolol 100 twice daily   > Diltiazem 180 mg daily   > on heparin drip > warfarin 2.5 mg, monitor INR    ???  2. Shortness of Breath/Lower extremity swelling, concern for DVT, volume overload  - LLE swelling greater than right since surgery  - has history of DVT  - BNP 327 on admission   - CXR with persistent left sided pleural effusion   - IR drain and Chest tube on left side  -CTS okay with anticoagulation while chest tube is in place  -Chest x-ray worse overnight showing increased pulmonary vascular congestion  -Net even over last 24 hours despite aggressive diuretic use  -I/O not accurate  -Refusing BiPAP, 2L nasal canula now  Plan for today:   > Continue Lasix drip 10mg /hr and intermittent boluses of chlorothiazide   > Encourage BiPAP    3.  Acute kidney injury, improving  -FE urea 8.4%, consistent with prerenal etiology  -Improving with diuresis, consistent with cardiorenal syndrome  -Renal ultrasound unremarkable: Urine output, oliguria  -Changed amoxicillin to doxycycline  -Urine eosinophils negative  - back to baseline this am  Plan:   > Avoid nephrotoxic medications   > Nephrology consulted   > continue diuresis    4.  Altered mental status, improved  -Hypercapnia versus uremia versus infectious  -Has been lethargic and confused for the past  3 days now  -Wanted to leave AMA multiple times  Plan:   >Diuresis with lasix drip   >on doxycycline for LLE cellulitis    5.  Cellulitis of lower extremity, left, improved  - Amoxicillin started on 10/12, end 10/15  Plan:   >continue doxycycline, end 03/23/17   ???  6. 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  7. Anemia, normocytic, chronic blood loss, not due to postoperative complications, stable  -Chronic secondary to blood loss  -EGD showing 3 columns of esophageal varices, mild portal hypertensive gastropathy without bleeding, G AVE without bleeding in gastric antrum, mild, and possible Barrett's esophagus, no biopsies taken  - has had extensive work up and evaluation by GI  - frequent upper/lower GIB  - requires frequent blood transfusions, last transfusion 10/12  -JKA antibodies               Plan for today:                > monitor    8. Hypothyroidism  > Continue Synthroid  ???  9. HLD  > Continue PTA statin  ???  10. HTN  > Continue metoprolol  ???  10. DMII tomorrow  - PTA regimen: NPH 18u QD, Novolog 4u TID w/ meals  > Continue PTA NPH, add on mid dose correction factor  ???  Fluids, electrolytes and Nutrition:  IVF: no IVF  Electrolytes: Monitor and replace PRN.  Diet:  Cardiac Diet  ???  Prophylaxis:  DVT: SCD  Stress ulcer: PPI  ???  Code status: Full Code  Disposition: contiue on CV1, continue ICU management  ???  Patient discussed with Dr. Duane Lope.    Ricard Dillon DO  Internal Medicine PGY-1   Pager: [phone]    ________________________________________________________________________    Subjective  Patient seen and examined.  Doing well this morning.  Breathing is improved on 2 L of oxygen now.  Only complaint is secondary to diuretics.  Denies headache, lightheadedness, dizziness, chest pain, chest tightness, shortness of breath, abdominal pain, diarrhea, fevers, chills.  No acute events overnight.  Did have one large dark colored bowel movement overnight.    Medications  Scheduled Meds:    aspirin chewable tablet 81 mg 81 mg Oral QDAY   cetirizine (ZYRTEC) tablet 10 mg 10 mg Oral QAM8   diltiazem CD (cardIZEM CD) capsule 180 mg 180 mg Oral QDAY   folic acid (FOLVITE) tablet 1 mg 1 mg Oral QDAY   heparin (porcine) injection 2,250-4,500 Units 20-40 Units/kg Intravenous As Prescribed   insulin aspart U-100 (NOVOLOG FLEXPEN) injection PEN 0-14 Units 0-14 Units Subcutaneous ACHS   insulin NPH (HUMULIN N KwikPen) injection PEN 18 Units 18 Units Subcutaneous QDAY(07)   levothyroxine (SYNTHROID) tablet 175 mcg 175 mcg Oral QDAY 30 min before breakfast magnesium oxide (MAG-OX) tablet 400 mg 400 mg Oral BID   magnesium sulfate   4 g/50 mL IVPB 2 g Intravenous ONCE   melatonin tablet 5 mg 5 mg Oral QHS   metoprolol tartrate (LOPRESSOR) tablet 100 mg 100 mg Oral BID   nystatin (NYSTOP) topical powder  Topical BID   pantoprazole DR (PROTONIX) tablet 80 mg 80 mg Oral QDAY(21)   rosuvastatin (CRESTOR) tablet 40 mg 40 mg Oral QDAY   senna/docusate (SENOKOT-S) tablet 2 tablet 2 tablet Oral BID   sucralfate (CARAFATE) tablet 1 g 1 g Oral QID   warfarin (COUMADIN) tablet 2.5 mg 2.5 mg Oral QHS   Continuous Infusions:  ??? furosemide (LASIX) 500 mg in 50 mL IV drip syr (max conc) 10 mg/hr (03/23/17 1232)   ??? heparin (porcine) 20,000 Units in dextrose 5% (D5W) 250 mL IV infusion (dbl conc) 1,570 Units/hr (03/23/17 1426)     PRN and Respiratory Meds:acetaminophen Q6H PRN, hydrOXYzine TID PRN, nitroglycerin Q5 MIN PRN, ondansetron Q8H PRN, traMADol Q6H PRN, warfarin QHS **AND** warfarin, pharmacy to manage Per Pharmacy      Review of Systems:  A 14 point review of systems was negative except for: Discussed in subjective    Objective:                          Vital Signs: Last Filed                 Vital Signs: 24 Hour Range   BP: 120/54 (10/21 1200)  Temp: 36.4 ???C (97.5 ???F) (10/21 1200)  Pulse: 79 (10/21 1200)  Respirations: 24 PER MINUTE (10/21 1200)  SpO2: 98 % (10/21 1200)  O2 Delivery: Nasal Cannula (10/21 1200)  SpO2 Pulse: 79 (10/21 1200) BP: (102-143)/(44-68)   Temp:  [36.3 ???C (97.4 ???F)-36.5 ???C (97.7 ???F)]   Pulse:  [77-100]   Respirations:  [19 PER MINUTE-24 PER MINUTE]   SpO2:  [95 %-98 %]   O2 Delivery: Nasal Cannula   Intensity Pain Scale (Self Report): 6 (03/23/17 0400) Vitals:    03/21/17 0600 03/22/17 0500 03/23/17 0400   Weight: 112.4 kg (247 lb 12.8 oz) 109.1 kg (240 lb 9.6 oz) 110.5 kg (243 lb 9.6 oz)       Intake/Output Summary:  (Last 24 hours)    Intake/Output Summary (Last 24 hours) at 03/23/17 1457  Last data filed at 03/23/17 1400   Gross per 24 hour Intake           1214.4 ml   Output             4150 ml   Net          -2935.6 ml      Stool Occurrence: 1    Physical Exam  General: alert and oriented  Eyes:  Conjunctivae/corneas clear.  EOMI bilaterally, no conjunctival injection  Lungs:  Crackles throughout lung fields  Heart:   Regular rhythm, tachycardic, S1, S2 normal, no appreciable murmur  Abdomen:  Soft, non-tender.  Bowel sounds normal.  No masses.  No organomegaly.  Extremities: Bilateral lower extremities.  2+ pitting edema bilaterally, mildly worse on the left side, there is a scar assumably where the vein graft was harvested, erythema improved, without any purulence noted  Skin: Scar on left leg from saphenous vein graft without purulence, there is erythema, warmth is equal bilaterally  Neurologic:   CNII - XII intact.  Normal strength, sensation and reflexes throughout.  Psych: normal mood and affect    Lab Review  24-hour labs:    Results for orders placed or performed during the hospital encounter of 03/13/17 (from the past 24 hour(s))   POC GLUCOSE    Collection Time: 03/22/17  4:54 PM   Result Value Ref Range  Glucose, POC 180 (H) 70 - 100 MG/DL   POC GLUCOSE    Collection Time: 03/22/17  7:53 PM   Result Value Ref Range    Glucose, POC 127 (H) 70 - 100 MG/DL   HEMOGLOBIN & HEMATOCRIT    Collection Time: 03/22/17  8:11 PM   Result Value Ref Range    Hemoglobin 8.1 (L) 12.0 - 15.0 GM/DL    Hematocrit 09.8 (L) 36 - 45 %   POC GLUCOSE    Collection Time: 03/22/17  8:55 PM   Result Value Ref Range    Glucose, POC 133 (H) 70 - 100 MG/DL   PROTIME INR (PT)    Collection Time: 03/23/17  4:34 AM   Result Value Ref Range    INR 1.4 (H) 0.8 - 1.2   PTT (APTT)    Collection Time: 03/23/17  4:34 AM   Result Value Ref Range    APTT 105.4 (H) 20.0 - 36.0 SEC   CBC    Collection Time: 03/23/17  4:34 AM   Result Value Ref Range    White Blood Cells 4.6 4.5 - 11.0 K/UL    RBC 2.55 (L) 4.0 - 5.0 M/UL    Hemoglobin 8.1 (L) 12.0 - 15.0 GM/DL Hematocrit 11.9 (L) 36 - 45 %    MCV 97.6 80 - 100 FL    MCH 31.8 26 - 34 PG    MCHC 32.6 32.0 - 36.0 G/DL    RDW 14.7 (H) 11 - 15 %    Platelet Count 146 (L) 150 - 400 K/UL    MPV 8.1 7 - 11 FL   BASIC METABOLIC PANEL    Collection Time: 03/23/17  4:34 AM   Result Value Ref Range    Sodium 137 137 - 147 MMOL/L    Potassium 3.4 (L) 3.5 - 5.1 MMOL/L    Chloride 98 98 - 110 MMOL/L    CO2 33 (H) 21 - 30 MMOL/L    Anion Gap 6 3 - 12    Glucose 133 (H) 70 - 100 MG/DL    Blood Urea Nitrogen 50 (H) 7 - 25 MG/DL    Creatinine 8.29 (H) 0.4 - 1.00 MG/DL    Calcium 9.4 8.5 - 56.2 MG/DL    eGFR Non African American 35 (L) >60 mL/min    eGFR African American 43 (L) >60 mL/min   MAGNESIUM    Collection Time: 03/23/17  4:34 AM   Result Value Ref Range    Magnesium 1.7 1.6 - 2.6 mg/dL   POC GLUCOSE    Collection Time: 03/23/17  6:53 AM   Result Value Ref Range    Glucose, POC 138 (H) 70 - 100 MG/DL   POC GLUCOSE    Collection Time: 03/23/17  1:22 PM   Result Value Ref Range    Glucose, POC 150 (H) 70 - 100 MG/DL       Point of Care Testing  (Last 24 hours)  Glucose: (!) 133 (03/23/17 0434)  POC Glucose (Download): (!) 150 (03/23/17 1322)    Radiology and other Diagnostics Review:    CT head March 20, 2017  No acute intracranial hemorrhage or mass effect.    Chest x-ray March 22, 2017  Impression: No significant change in appearance of chest with stable   postoperative mediastinal configuration, cardiomegaly, left pleural   effusion with associated left lung consolidation as described. Stable mild   central venous congestion.      Ricard Dillon DO  Internal Medicine PGY 1  Pager [phone]

## 2017-03-23 NOTE — Progress Notes
Renal Progress Note    Name:  Diane Savage   ZOXWR'U Date:  03/23/2017  Admission Date: 03/13/2017  LOS: 10 days          Assessment and Plan   Principal Problem:    Atrial fibrillation with rapid ventricular response (HCC)  Active Problems:    Type 2 diabetes mellitus with circulatory disorder, without long-term current use of insulin (HCC)    PAF (paroxysmal atrial fibrillation) (HCC)    Chronic gastrointestinal bleeding    Transfusion-dependent anemia    CAD (coronary artery disease), native coronary artery    HLD (hyperlipidemia)    Hypothyroidism    AKI (acute kidney injury) (HCC)    Essential hypertension    Pleural effusion on left    Left leg cellulitis    Barrett's esophagus    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 female s/p CABG admitted for dyspnea and volume overload.  ???  AKI  -no significant CKD  -renal ultrasound without hydronephrosis  -no recent nephrotoxins  -creatinine progressively increasing  -I&O appear inaccurate  -FeUrea 8% suggesting renal hypoperfusion  ???  Atrial flutter  Leukocytosis  CAD s/p CABG  Anemia  HTN  Hyponatremia, hypervolemic  Encephalopathy, likely related to hypercapnea  ???  Recommendations:  -renal function continues to improve  -continue IV diuresis, likely transition to PO soon  -strict I&O, standing daily wieght  -please call with questions or concerns  ???  [name], MD  PGY-5 Nephrology Fellow  Pager [phone]  ???      Subjective     Diane Savage is a 61 y.o. female no issues overnight, creatinine continues to improve, UOP 4.3 L, transferred to floor today.      Medications     Medications    MEDS    aspirin 81 mg Oral QDAY   cetirizine 10 mg Oral QAM8   diltiazem CD 180 mg Oral QDAY   folic acid 1 mg Oral QDAY   heparin (porcine) 20-40 Units/kg Intravenous As Prescribed   insulin aspart U-100 0-14 Units Subcutaneous ACHS insulin NPH 18 Units Subcutaneous QDAY(07)   levothyroxine 175 mcg Oral QDAY 30 min before breakfast   magnesium oxide 400 mg Oral BID   magnesium sulfate 4 g/50 mL 2 g Intravenous ONCE   melatonin 5 mg Oral QHS   metoprolol tartrate 100 mg Oral BID   nystatin  Topical BID   pantoprazole DR 80 mg Oral QDAY(21)   rosuvastatin 40 mg Oral QDAY   senna/docusate 2 tablet Oral BID   sucralfate 1 g Oral QID   warfarin 2.5 mg Oral QHS    IV MEDS  ??? furosemide (LASIX) 500 mg in 50 mL IV drip syr (max conc) 10 mg/hr (03/23/17 1232)   ??? heparin (porcine) 20,000 Units in dextrose 5% (D5W) 250 mL IV infusion (dbl conc) 1,570 Units/hr (03/23/17 1426)     Prn acetaminophen Q6H PRN 650 mg at 03/22/17 1814, hydrOXYzine TID PRN 25 mg at 03/23/17 0759, nitroglycerin Q5 MIN PRN, ondansetron Q8H PRN 8 mg at 03/17/17 0956, traMADol Q6H PRN 50 mg at 03/23/17 0332, warfarin QHS 2.5 mg at 03/22/17 2118 **AND** warfarin, pharmacy to manage Per Pharmacy           Physical Exam     Vital Signs: Last Filed In 24 Hours Vital Signs: 24 Hour Range   BP: 134/61 (10/21 1525)  Temp: 36.4 ???C (97.6 ???F) (10/21 1525)  Pulse: 79 (10/21 1525)  Respirations: 16 PER MINUTE (10/21 1525)  SpO2: 100 % (10/21 1525)  O2 Delivery: Nasal Cannula (10/21 1525)  SpO2 Pulse: 79 (10/21 1200) BP: (102-143)/(44-68)   Temp:  [36.3 ???C (97.4 ???F)-36.5 ???C (97.7 ???F)]   Pulse:  [77-100]   Respirations:  [16 PER MINUTE-24 PER MINUTE]   SpO2:  [95 %-100 %]   O2 Delivery: Nasal Cannula   Intensity Pain Scale (Self Report): 6 (03/23/17 0400)        Intake/Output Summary (Last 24 hours) at 03/23/17 1552  Last data filed at 03/23/17 1400   Gross per 24 hour   Intake           1193.8 ml   Output             4150 ml   Net          -2956.2 ml      Vitals:    03/21/17 0600 03/22/17 0500 03/23/17 0400   Weight: 112.4 kg (247 lb 12.8 oz) 109.1 kg (240 lb 9.6 oz) 110.5 kg (243 lb 9.6 oz)     ???  Gen: drowsy, mild respiratory distress   HEENT: Sclera normal; MMM   CV:no JVD, regular rhythm Pulm: Clear to Auscultation bilateral, diminished  GI: BS+ x4, non-tender to palpation  Neuro: drowsy, moving all ext  Ext: trace ble edema, no clubbing or cyanosis   Skin: no rash, dry      Labs:      Recent Labs      03/20/17   1927  03/21/17   0520  03/22/17   0424  03/23/17   0434   NA  132*  132*  133*  137   K  4.4  3.7  3.5  3.4*   CL  98  97*  97*  98   CO2  26  26  28   33*   GAP  8  9  8  6    BUN  53*  56*  54*  50*   CR  2.94*  2.42*  1.91*  1.50*   GLU  150*  164*  179*  133*   CA  9.3  9.1  9.1  9.4   MG   --   1.7  1.8  1.7       Recent Labs      03/20/17   2324  03/21/17   0520  03/21/17   1400  03/21/17   2104  03/22/17   0424  03/22/17   2011  03/23/17   0434   WBC  6.3  7.3   --    --   5.1   --   4.6   HGB  8.2*  8.3*   --    --   8.0*  8.1*  8.1*   HCT  25.5*  25.6*   --    --   24.6*  24.1*  24.9*   PLTCT  187  193   --    --   158   --   146*   INR   --    --    --    --    --    --   1.4*   PTT  26.4  126.3*  88.1*  85.7*  96.4*   --   105.4*      Estimated Creatinine Clearance: 48.5 mL/min (A) (based on  SCr of 1.5 mg/dL (H)).  Vitals:    03/21/17 0600 03/22/17 0500 03/23/17 0400   Weight: 112.4 kg (247 lb 12.8 oz) 109.1 kg (240 lb 9.6 oz) 110.5 kg (243 lb 9.6 oz)      No results for input(s): PHART, PO2ART in the last 72 hours.    Invalid input(s): PC02A

## 2017-03-23 NOTE — Patient Education
Pharmacy Warfarin Teaching     Diane Savage was provided with both verbal and written drug information about warfarin. Discussion with the patient included: the medication regimen, dosing, monitoring, possible adverse effects, food/drug interactions to be aware of and OTC/herbal medication use. Emphasis was placed on the importance of medication compliance. The patient was also encouraged to contact the pharmacist with any further questions.    [place] Intern  03/23/2017

## 2017-03-23 NOTE — Progress Notes
03/23/17 0000   Unmeasurable Elimination   Stool Occurrence 1   Stool Characteristics   Stool Amount Large   Stool Appearance Formed   Stool Color Black  (CV Fellow notified, H & H drawn tonight, Con. to monitor.)

## 2017-03-24 ENCOUNTER — Inpatient Hospital Stay: Admit: 2017-03-24 | Discharge: 2017-03-24 | Payer: MEDICARE

## 2017-03-24 DIAGNOSIS — I4891 Unspecified atrial fibrillation: Secondary | ICD-10-CM

## 2017-03-24 LAB — CBC
Lab: 2.5 M/UL — ABNORMAL LOW (ref 4.0–5.0)
Lab: 4.3 K/UL — ABNORMAL LOW (ref 4.5–11.0)
Lab: 18 % — ABNORMAL HIGH (ref 11–15)
Lab: 25 % — ABNORMAL LOW (ref 36–45)
Lab: 31 pg (ref 26–34)
Lab: 32 g/dL (ref 32.0–36.0)
Lab: 4.7 10*3/uL (ref 4.5–11.0)
Lab: 8.2 FL (ref 7–11)
Lab: 8.2 g/dL — ABNORMAL LOW (ref 12.0–15.0)
Lab: 97 FL (ref 80–100)

## 2017-03-24 LAB — PTT (APTT)
Lab: 111 s — ABNORMAL HIGH (ref >60)
Lab: 96 s — ABNORMAL HIGH (ref 20.0–36.0)

## 2017-03-24 LAB — POC GLUCOSE
Lab: 242 mg/dL — ABNORMAL HIGH (ref 70–100)
Lab: 199 mg/dL — ABNORMAL HIGH (ref 70–100)
Lab: 160 mg/dL — ABNORMAL HIGH (ref 70–100)
Lab: 186 mg/dL — ABNORMAL HIGH (ref 70–100)

## 2017-03-24 LAB — PROTIME INR (PT): Lab: 1.5 MMOL/L — ABNORMAL HIGH (ref >60)

## 2017-03-24 LAB — MAGNESIUM: Lab: 1.9 mg/dL (ref 1.6–2.6)

## 2017-03-24 LAB — BASIC METABOLIC PANEL: Lab: 137 MMOL/L — ABNORMAL LOW (ref 137–147)

## 2017-03-24 MED ORDER — DOCUSATE SODIUM 100 MG PO CAP
200 mg | Freq: Once | ORAL | 0 refills | Status: CP
Start: 2017-03-24 — End: ?
  Administered 2017-03-24: 22:00:00 200 mg via ORAL

## 2017-03-24 MED ORDER — POTASSIUM CHLORIDE 20 MEQ PO TBTQ
60 meq | Freq: Once | ORAL | 0 refills | Status: CP
Start: 2017-03-24 — End: ?
  Administered 2017-03-24: 12:00:00 60 meq via ORAL

## 2017-03-24 MED ORDER — POTASSIUM CHLORIDE 20 MEQ PO TBTQ
60 meq | Freq: Once | ORAL | 0 refills | Status: CP
Start: 2017-03-24 — End: ?
  Administered 2017-03-24: 23:00:00 40 meq via ORAL

## 2017-03-24 MED ORDER — POTASSIUM CHLORIDE 20 MEQ PO TBTQ
40 meq | Freq: Once | ORAL | 0 refills | Status: DC
Start: 2017-03-24 — End: 2017-03-24

## 2017-03-24 MED ORDER — MAGNESIUM CITRATE PO SOLN
296 mL | Freq: Once | ORAL | 0 refills | Status: CP
Start: 2017-03-24 — End: ?
  Administered 2017-03-24: 296 mL via ORAL

## 2017-03-24 MED ORDER — FUROSEMIDE 10 MG/ML IJ SOLN
100 mg | Freq: Once | INTRAVENOUS | 0 refills | Status: CP
Start: 2017-03-24 — End: ?
  Administered 2017-03-24: 16:00:00 100 mg via INTRAVENOUS

## 2017-03-24 NOTE — Progress Notes
Pharmacy Warfarin Note  Subjective:   Pharmacy consulted to assist with management of warfarin therapy.    Objective:   Diane Savage is a 61 y.o. female receiving warfarin for AF.   Current Warfarin Orders   Medication Dose Route Frequency    warfarin (COUMADIN) tablet 2.5 mg  2.5 mg Oral QHS    And    warfarin, pharmacy to manage  1 each Service Per Pharmacy     Bridge therapy: hep gtt.  Patient's warfarin dose prior to admission: n/a  INR   Date/Time Value Ref Range Status   03/24/2017 0424 1.5 (H) 0.8 - 1.2 Final   03/23/2017 0434 1.4 (H) 0.8 - 1.2 Final     Drug interaction(s): none.    Assessment:   Patient's goal INR is 2-3 for AF.  INR:   INR (no units)   Date/Time Value   03/24/2017 0424 1.5 (H)       Plan:   1.   continue warfarin at 2.5 mg  2.  Next INR: Tomorrow  3.  Pharmacy will continue to monitor, follow and adjust therapy as needed.    If a patient requires an invasive procedure that necessitates warfarin being held, anticoagulation reversal, or if a provider other than Pharmacy discontinues/places an order for warfarin, the pharmacy to manage warfarin order will be discontinued per the policy and warfarin therapy will no longer be managed by [place], [facility]  03/24/2017

## 2017-03-24 NOTE — Progress Notes
RT Adult Assessment Note    NAME:Diane Savage             MRN: [id number]             DOB:05-26-1956          AGE: 61 y.o.  ADMISSION DATE: 03/13/2017             DAYS ADMITTED: LOS: 11 days    RT Treatment Plan:       Protocol Plan: Procedures  PAP: Q4h PAP  IPPB: Place a nursing order for "IS Q1h While Awake" for any of Lung Expansion indicators  Oxygen/Humidity: O2 to keep SpO2 > 92%  Monitoring: Pulse oximetry BID & PRN    Additional Comments:  Impressions of the patient: shallow breaths. CXR atelectasis. Continue lung expansion therapy.     Vital Signs:  Pulse: Pulse: 85  RR: Respirations: 17 PER MINUTE  SpO2: SpO2: 94 %  O2 Device: $$ O2 Device: Standby  Liter Flow: O2 Liter Flow:  (RA)  O2%:    Breath Sounds:    Respiratory Effort: Respiratory Effort: Non-Labored

## 2017-03-24 NOTE — Progress Notes
OCCUPATIONAL THERAPY  PROGRESS NOTE    Patient Name: Diane Savage                   Room/Bed: HC530/01  Admitting Diagnosis:   Past Medical History:   Diagnosis Date   ??? Acquired hypothyroidism    ??? Arthritis    ??? Back pain    ??? Bleeding disorder (HCC)    ??? CAD (coronary artery disease), native coronary artery 02/04/2017   ??? Chronic diastolic heart failure (HCC) 03/19/2017   ??? Coronary artery disease    ??? Diabetes mellitus (HCC)     Type II   ??? Essential hypertension 02/11/2017   ??? Hypertension    ??? Stomach disorder    ??? Vision decreased        Mobility  Progressive Mobility Level: Walk in room  Distance Walked (feet): 4 ft  Level of Assistance: Assist X1  Assistive Device: None  Time Tolerated: 0-10 minutes  Activity Limited By: Patient request to stop;Fatigue    Subjective  Pertinent Dx per Physician: s/p CABG/MAZE/ ligation LAA on Dr. Farris Has on 02/04/17 admitted 03/13/17 with atrial fibrillation and left pleural effusion. Chest tube placed 10/12. Transferred to [facility] 10/18 for respiratory concerns.  Precautions: Falls  Pain / Complaints: Patient agrees to participate in therapy;Patient has no c/o pain    Objective  Psychosocial Status: Willing and Cooperative to Participate  Persons Present: Spouse    Home Living  Type of Home: House  Home Layout: One Level;Ramped Entrance    Prior Function  Level Of Independence: Independent with ADLs and functional transfers  Lives With: Spouse  Other Function Comments: Spouse in power scooter. Patient states she was not having difficulty with ADLS or mobility at home since CABG    ADL's  LE Dressing Assist: Total Assist  LE Dressing Deficits: Don/Doff R Sock;Don/Doff L Sock  Functional Transfer Assist: Minimal Assist  Functional Transfer Deficits: Steadying;Supervision/Safety  Comment: Patient requesting to return to bed upon entry. Patient states she is so fatigued and has been up in the chair for one hour. Patient required contact guard assist to transfer to bed and assist to lift B LES into bed. Assist to doff both socks.    Activity Tolerance  Endurance: 1/5 Tolerates <10 Minutes Exercises, No Significant Change in Vital Signs  Sitting Balance: 3+/5 Sits w/o UE Support for 30 Seconds or Greater    Cognition  Overall Cognitive Status: WFL to Adequately Complete Self Care Tasks Safely    Assessment  Assessment: Decreased ADL Status;Decreased Endurance;Decreased Self-Care Trans;Decreased High-Level ADLs;Decreased UE Strength  Prognosis: Good;w/Cont OT s/p Acute Discharge  Goal Formulation: Patient    AM-PAC 6 Clicks Basic Mobility Inpatient  Turning from your back to your side while in a flat bed without using bed rails: A Little  Moving from lying on your back to sitting on the side of a flatbed without using bedrails : A Little  Moving to and from a bed to a chair (including a wheelchair): A Little  Standing up from a chair using your arms (e.g. wheelchair, or bedside chair): A Little  To walk in hospital room: A Little  Climbing 3-5 steps with a railing: A Lot  Raw Score: 17  Standardized (T-scale) Score: 39.67  Basic Mobility CMS 0-100%: 43.83  CMS G Code Modifier for Basic Mobility: CK    AM-PAC 6 Clicks Daily Activity Inpatient  Putting on and taking off regular lower body clothes?: A Lot  Bathing (Including  washing, rinsing, drying): A Lot  Toileting, which includes using toilet, bedpan, or urinal: A Lot  Putting on and taking off regular upper body clothing: A Little  Taking care of personal grooming such as brushing teeth: None  Eating meals?: None  Daily Activity Raw Score: 17  Standardized (t-scale) score: 37.26  CMS 0-100% Score: 50.11  CMS G Code Modifier: CK    Plan  OT Frequency: 5x/week  OT Plan for Next Visit: Progress mobility to standing at sink for grooming with chair behind as needed.    ADL Goals  Patient Will Perform Grooming: Standing at Sink;w/ Stand By Assist Patient Will Perform Toileting: w/ Stand By Assist    Functional Transfer Goals  Pt Will Perform All Functional Transfers: w/ Stand By Assist    OT Discharge Recommendations  OT Discharge Recommendations: Inpatient Setting  Equipment Recommendations: Too early to be determined        Therapist: Sherri Sear [id number]  Date: 03/24/2017

## 2017-03-24 NOTE — Progress Notes
PHYSICAL THERAPY  NOTE         Patient adamantly declined all mobility for today repeatedly stating "I'm sick." Therapist educated on importance of increasing out of bed activity and progressing mobility with patient's desire to return home. Patient continued to decline despite therapists max encouragement and additional support from spouse. Therapist notified RN of reported nausea with providing medication however no change in wanting to get out of bed with therapy. Patient stated she will try to work tomorrow but unable to get commitment for scheduled time for therapy. Noted withdrawal from conversation with therapist and increase agitation at times following encouragement. At this time, recommend inpatient setting due to physical assistance needed with all functional mobility and patient unable to tolerate ambulation. Spouse unable to provide any assistance at home with patient needing to be Independent with mobility ambulating safely 29ft with walker if wanting to discharge to home.  Physical therapy will continue to follow and provide intervention as indicated.     Therapist: Eleonore Chiquito  Date: 03/24/2017

## 2017-03-24 NOTE — Progress Notes
Renal Progress Note    Name:  Diane Savage   [id number] Date:  03/24/2017  Admission Date: 03/13/2017  LOS: 11 days          Assessment and Plan   Principal Problem:    Atrial fibrillation with rapid ventricular response (HCC)  Active Problems:    Type 2 diabetes mellitus with circulatory disorder, without long-term current use of insulin (HCC)    PAF (paroxysmal atrial fibrillation) (HCC)    Chronic gastrointestinal bleeding    Transfusion-dependent anemia    CAD (coronary artery disease), native coronary artery    HLD (hyperlipidemia)    Hypothyroidism    AKI (acute kidney injury) (HCC)    Essential hypertension    Pleural effusion on left    Left leg cellulitis    Barrett's esophagus    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 female s/p CABG admitted for dyspnea and volume overload.  ???  AKI  -no significant CKD  -renal ultrasound without hydronephrosis  -no recent nephrotoxins  -creatinine progressively increasing  -I&O appear inaccurate  -FeUrea 8% suggesting renal hypoperfusion  ???  Atrial flutter  Leukocytosis  CAD s/p CABG  Anemia  HTN  Hyponatremia, hypervolemic  Encephalopathy, likely related to hypercapnea  ???  Recommendations:  -renal function continues to improve, now near baseline  -continue IV diuresis  -strict I&O, standing daily wieght  -please call with questions or concerns  ???  [name], MD  PGY-5 Nephrology Fellow  Pager [phone]  ???      Subjective     Diane Savage is a 61 y.o. female no issues overnight, creatinine 1.3, off oxygen, doing well.      Medications     Medications    MEDS    aspirin 81 mg Oral QDAY   cetirizine 10 mg Oral QAM8   diltiazem CD 180 mg Oral QDAY   folic acid 1 mg Oral QDAY   heparin (porcine) 20-40 Units/kg Intravenous As Prescribed   insulin aspart U-100 0-14 Units Subcutaneous ACHS   insulin NPH 18 Units Subcutaneous QDAY(07) levothyroxine 175 mcg Oral QDAY 30 min before breakfast   magnesium citrate 296 mL Oral ONCE   magnesium oxide 400 mg Oral BID   melatonin 5 mg Oral QHS   metoprolol tartrate 100 mg Oral BID   nystatin  Topical BID   pantoprazole DR 80 mg Oral QDAY(21)   potassium chloride SR 60 mEq Oral ONCE   rosuvastatin 40 mg Oral QDAY   senna/docusate 2 tablet Oral BID   sucralfate 1 g Oral QID   warfarin 2.5 mg Oral QHS    IV MEDS  ??? heparin (porcine) 20,000 Units in dextrose 5% (D5W) 250 mL IV infusion (dbl conc) Stopped (03/24/17 1432)     Prn acetaminophen Q6H PRN 650 mg at 03/22/17 1814, hydrOXYzine TID PRN 25 mg at 03/23/17 2012, nitroglycerin Q5 MIN PRN, ondansetron Q8H PRN 8 mg at 03/24/17 1118, traMADol Q6H PRN 50 mg at 03/24/17 1541, warfarin QHS 2.5 mg at 03/23/17 2012 **AND** warfarin, pharmacy to manage Per Pharmacy           Physical Exam     Vital Signs: Last Filed In 24 Hours Vital Signs: 24 Hour Range   BP: 143/50 (10/22 1100)  Temp: 36.5 ???C (97.7 ???F) (10/22 1100)  Pulse: 82 (10/22 1258)  Respirations: 14 PER MINUTE (10/22 1100)  SpO2: 94 % (10/22 1100)  O2 Delivery: Nasal Cannula (10/22 1100) BP: (124-148)/(50-78)   Temp:  [36.3 ???C (97.4 ???F)-36.6 ???C (97.9 ???F)]   Pulse:  [82-92]   Respirations:  [14 PER MINUTE-18 PER MINUTE]   SpO2:  [92 %-100 %]   O2 Delivery: Nasal Cannula   Intensity Pain Scale (Self Report): 8 (03/24/17 1540)        Intake/Output Summary (Last 24 hours) at 03/24/17 1724  Last data filed at 03/24/17 1617   Gross per 24 hour   Intake              652 ml   Output             3175 ml   Net            -2523 ml      Vitals:    03/22/17 0500 03/23/17 0400 03/24/17 0305   Weight: 109.1 kg (240 lb 9.6 oz) 110.5 kg (243 lb 9.6 oz) 108 kg (238 lb)     ???  Gen: drowsy, mild respiratory distress   HEENT: Sclera normal; MMM   CV:no JVD, regular rhythm  Pulm: Clear to Auscultation bilateral, diminished  GI: BS+ x4, non-tender to palpation  Neuro: drowsy, moving all ext Ext: trace ble edema, no clubbing or cyanosis   Skin: no rash, dry      Labs:      Recent Labs      03/22/17   0424  03/23/17   0434  03/24/17   0424   NA  133*  137  137   K  3.5  3.4*  3.4*   CL  97*  98  95*   CO2  28  33*  35*   GAP  8  6  7    BUN  54*  50*  44*   CR  1.91*  1.50*  1.28*   GLU  179*  133*  172*   CA  9.1  9.4  9.3   MG  1.8  1.7  1.9       Recent Labs      03/21/17   2104  03/22/17   0424  03/22/17   2011  03/23/17   0434  03/24/17   0424  03/24/17   1410   WBC   --   5.1   --   4.6  4.3*  4.7   HGB   --   8.0*  8.1*  8.1*  8.0*  8.2*   HCT   --   24.6*  24.1*  24.9*  24.9*  25.2*   PLTCT   --   158   --   146*  128*  139*   INR   --    --    --   1.4*  1.5*   --    PTT  85.7*  96.4*   --   105.4*  111.3*  96.5*      Estimated Creatinine Clearance: 56.1 mL/min (A) (based on SCr of 1.28 mg/dL (H)).  Vitals:    03/22/17 0500 03/23/17 0400 03/24/17 0305   Weight: 109.1 kg (240 lb 9.6 oz) 110.5 kg (243 lb 9.6 oz) 108 kg (238 lb)      No results for input(s): PHART, PO2ART in the last 72 hours.    Invalid input(s): PC02A

## 2017-03-24 NOTE — Progress Notes
Pt c/o "sharp" abd pain since seeing blood in stool. Tramadol given per pt request. Pt then requesting "chex mix" as the only thing that sounds good. Education provided regarding Cardiac diet and Sodium restrictions. Pt voiced understanding of education and then began eating chex mix.

## 2017-03-24 NOTE — Progress Notes
Gastroenterology Progress Note  Patient Name:Diane Savage         ZOX:[id number]  Admission Date: 03/13/2017  2:56 PM      Principal Problem:    Atrial fibrillation with rapid ventricular response (HCC)  Active Problems:    Type 2 diabetes mellitus with circulatory disorder, without long-term current use of insulin (HCC)    PAF (paroxysmal atrial fibrillation) (HCC)    Chronic gastrointestinal bleeding    Transfusion-dependent anemia    CAD (coronary artery disease), native coronary artery    HLD (hyperlipidemia)    Hypothyroidism    AKI (acute kidney injury) (HCC)    Essential hypertension    Pleural effusion on left    Left leg cellulitis    Barrett's esophagus    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 PMH significant for CAD, STEMI, prior CABG 9/28 with subsequent development of atrial fibrillation/flutter not currently on anticoagulation, DM, hypertension.  She had previously consulted for evaluation of anticoagulation versus ischemic risk in the setting of a presumed chronic GI bleed.  EGD performed 10/15 notable for findings of small amount of GAVE, portal hypertensive gastropathy, grade 1 esophageal varices.  No APC performed due to lack of benefit in the setting of small amount of GAVE.  Patient now presents with one episode of hematochezia, described as bright red blood per rectum while on heparin to warfarin bridge for atrial fibrillation prophylactic anticoagulation.    #Hematochezia  #GAVE  #Esophageal varices  #Need for recurrent blood transfusions  #Atrial fibrillation/flutter, not currently anticoagulated  #Recent CABG procedure    Recommendations:  ??? Given the new findings of minimal generalized abdominal pain as well as one episode of hematochezia, we will perform colonoscopy tomorrow for further evaluation of her hematochezia. ???DDX: Hemorrhoidal bleeding, diverticular bleeding, colonic AVM, ischemic colitis, bleeding colonic polyp, small bowel etiology such as small bowel AVM or polyp.  ??? Please prep the patient with magnesium citrate and docusate as the patient is refusing GoLYTELY.  Titrate to clear bowel movements.  We will attempt to have her be an early case in the morning so as if her colonoscopy is negative she will be able to resume anticoagulation with minimal gap  ???From a GI perspective we would prefer heparin to be held 4-6 hours prior to her procedure.  Noted that it is currently off.  ???Resuscitation per primary team as indicated  ??? Should the patient develop hemodynamic instability or brisk GI bleed, please contact the on-call fellow    Patient discussed with attending on service, Dr. Jake Shark A. Joanne Gavel, DO  Pager [phone]  GI Fellow  03/24/2017 5:26 PM       -----------------------------  Interval Events  No acute events overnight  GI is reconsulted in the setting of one episode of hematochezia associated with generalized minimal abdominal pain  Patient's repeat hemoglobin after episode of hematochezia at 8.2 from 8.0 previously  Patient is hemodynamically stable and non-tachycardic.  Is nontoxic and is resting comfortably.    The patient feels otherwise well, denies any fever/chills, SOB, chest pain, N/V, dysuria.    PMH:  Past Medical History:   Diagnosis Date   ??? Acquired hypothyroidism    ??? Arthritis    ??? Back pain    ??? Bleeding disorder (HCC)    ???  CAD (coronary artery disease), native coronary artery 02/04/2017   ??? Chronic diastolic heart failure (HCC) 03/19/2017   ??? Coronary artery disease    ??? Diabetes mellitus (HCC)     Type II   ??? Essential hypertension 02/11/2017   ??? Hypertension    ??? Stomach disorder    ??? Vision decreased        Current medications:  No current facility-administered medications on file prior to encounter.      Current Outpatient Prescriptions on File Prior to Encounter Medication Sig Dispense Refill   ??? acetaminophen (TYLENOL) 325 mg tablet Take two tablets by mouth every 6 hours as needed.  0   ??? aspirin 81 mg chewable tablet Chew one tablet by mouth daily with food. 90 tablet 3   ??? cetirizine (ZYRTEC) 10 mg tablet Take 10 mg by mouth every morning.     ??? folic acid (FOLVITE) 1 mg tablet Take 1 mg by mouth daily.     ??? GLUCOSAMINE HCL/CHONDROITIN SU (GLUCOSAMINE-CHONDROITIN PO) Take 1 tablet by mouth twice daily.     ??? insulin aspart U-100 (NOVOLOG) 100 unit/mL injection Inject four Units under the skin three times daily with meals. 10 mL 30   ??? insulin NPH (HUMULIN N NPH U-100 INSULIN) 100 unit/mL injection Inject fourteen Units under the skin every morning. 10 mL 30   ??? Insulin Syringe-Needle U-100 (BD INSULIN SYRINGE ULTRA-FINE) 0.3 mL 31 gauge x 5/16 syrg Use four times daily with Insulin 100 each 0   ??? levothyroxine (SYNTHROID) 175 mcg tablet Take 175 mcg by mouth daily 30 minutes before breakfast.     ??? nitroglycerin (NITROSTAT) 0.4 mg tablet Place 0.4 mg under tongue every 5 minutes as needed for Chest Pain. Max of 3 tablets, call 911.     ??? omeprazole DR(+) (PRILOSEC) 40 mg capsule Take 40 mg by mouth twice daily.     ??? ondansetron (ZOFRAN) 8 mg tablet Take 8 mg by mouth every 8 hours as needed for Nausea or Vomiting.     ??? senna/docusate (SENOKOT-S) 8.6/50 mg tablet Take two tablets by mouth twice daily. (Patient taking differently: Take 2 tablets by mouth at bedtime daily.) 20 tablet 0   ??? traMADol (ULTRAM) 50 mg tablet Take one tablet by mouth every 6 hours as needed for Pain. 30 tablet 0       PSH:  Past Surgical History:   Procedure Laterality Date   ??? HX HEART CATHETERIZATION  2017   ??? CORONARY STENT PLACEMENT  2017   ??? CORONARY ARTERY BYPASS GRAFT N/A 02/04/2017    CORONARY ARTERY BYPASS WITH ARTERIAL GRAFT - 4 GRAFTS (Internal Mammary Artery and Endovascular Vein Harvest) performed by Collene Schlichter, MD at [facility]   ??? HX MAZE N/A 02/04/2017 MAZE PROCEDURE performed by Collene Schlichter, MD at [facility]   ??? UPPER GASTROINTESTINAL ENDOSCOPY N/A 03/17/2017    ESOPHAGOGASTRODUODENOSCOPY performed by Jolee Ewing, MD at [facility]   ??? ANKLE SURGERY      Left ankle reconstruction   ??? COLONOSCOPY     ??? HX KNEE ARTHROSCOPY Left    ??? HX TONSIL AND ADENOIDECTOMY     ??? TUBAL LIGATION     ??? UPPER GASTROINTESTINAL ENDOSCOPY         SH:  Social History     Social History   ??? Marital status: Married     Spouse name: N/A   ??? Number of children: N/A   ??? Years of education: N/A  Occupational History   ??? Not on file.     Social History Main Topics   ??? Smoking status: Former Smoker     Packs/day: 2.00     Years: 10.00     Types: Cigarettes     Quit date: 09/16/1980   ??? Smokeless tobacco: Never Used   ??? Alcohol use Yes      Comment: Seldom   ??? Drug use: No   ??? Sexual activity: Not on file     Other Topics Concern   ??? Not on file     Social History Narrative   ??? No narrative on file       FH:  Family History   Problem Relation Age of Onset   ??? Diabetes Mother    ??? Heart Disease Mother    ??? High Cholesterol Mother    ??? Arthritis-rheumatoid Mother    ??? Stroke Mother    ??? Thyroid Disease Mother    ??? Coronary Artery Disease Mother    ??? Hyperlipidemia Mother    ??? Cancer Father    ??? Cancer-Breast Sister    ??? Cancer Sister    ??? Hypertension Sister    ??? Heart Disease Sister    ??? High Cholesterol Sister    ??? Arthritis-osteo Sister    ??? Migraines Sister    ??? Rashes/Skin Problems Sister    ??? Depression Sister    ??? Hyperlipidemia Sister    ??? Cancer Brother    ??? Hypertension Brother    ??? Arthritis-rheumatoid Brother    ??? Rashes/Skin Problems Brother    ??? Coronary Artery Disease Brother    ??? Heart Disease Maternal Aunt    ??? Coronary Artery Disease Paternal Uncle    ??? Hyperlipidemia Paternal Uncle    ??? Cancer Maternal Grandmother    ??? Diabetes Maternal Grandmother    ??? Heart Disease Maternal Grandmother    ??? Cancer-Colon Paternal Grandmother    ??? Cancer Paternal Grandmother ??? Diabetes Paternal Grandmother    ??? Cancer Paternal Grandfather        Physical Exam:  Vitals:    03/24/17 0841 03/24/17 0939 03/24/17 1100 03/24/17 1258   BP: 124/78  143/50    Pulse: 84 85 86 82   Temp: 36.4 ???C (97.5 ???F)  36.5 ???C (97.7 ???F)    SpO2: 92% 94% 94%    Weight:       Height:         General - Alert and oriented, no acute distress.   Head - Normocephalic, atraumatic.   Eyes -  EOMI grossly. No icterus or injection.   Oropharynx- No ulcer or bleeding, moist mucosa.  Neck - No swelling or tracheal deviation.   Lung - soft crackles in b/l bases, on 2L NC  Abd -  Soft, minimally TTP generalized, non distended, normal bowel sounds, no hepatospenomegaly.  Extremities - Warm, dry. 2+ pitting edema b/l LEs  Skin - No exposed rash, lesion.  Neurological - No gross deficit    Labs/Imaging:  Pertient labs/imaging was reviewed on initiation of progress note.

## 2017-03-24 NOTE — Progress Notes
General Progress Note    Name:  Diane Savage   [id number] Date:  03/24/2017  Admission Date: 03/13/2017  LOS: 11 days                     Assessment/Plan:    Principal Problem:    Atrial fibrillation with rapid ventricular response (HCC)  Active Problems:    Type 2 diabetes mellitus with circulatory disorder, without long-term current use of insulin (HCC)    PAF (paroxysmal atrial fibrillation) (HCC)    Chronic gastrointestinal bleeding    Transfusion-dependent anemia    CAD (coronary artery disease), native coronary artery    HLD (hyperlipidemia)    Hypothyroidism    AKI (acute kidney injury) (HCC)    Essential hypertension    Pleural effusion on left    Left leg cellulitis    Barrett's esophagus    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  ???  1. 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 >???Continue ASA and Metoprolol Tartrate 100mg  BID               > Diltiazem 180 mg daily > on heparin drip as bridge to warfarin               > warfarin 2.5 mg, monitor INR daily going forward (1.5 today)  ???  ???  2. Shortness of Breath/Lower extremity swelling, volume overload  - LLE swelling greater than right since surgery  - has history of DVT  - BNP 327 on admission   - Daily CXR with persistent left sided pleural effusion   - IR drain and Chest tube on left side (removed 10/17)  - OPxygen requirement titrated down, on room air 03/24/2017  Plan for today:               > Discontinue Lasix drip 10mg /hr    > 100mg  Iv lasix One time today; assess I/O going forward    > Will need daily diuresis plan going forward  ???  3.  Acute kidney injury, improving  -FE urea 8.4%, consistent with prerenal etiology on admission  -Improving with diuresis, consistent with cardiorenal syndrome  -Renal ultrasound unremarkable  -Urine eosinophils negative  Plan:               > Avoid nephrotoxic medications               > Nephrology consulted               >  continue diuresis; transition to PO soon  ???  4.  Altered mental status, improved  -Hypercapnia versus uremia versus infectious  -Has been lethargic and confused for the past 3 days now  -Wanted to leave AMA multiple times  Plan:               >AOx3 today w/o abnormality  ???  5.  Cellulitis of lower extremity, left, improved  - Amoxicillin started on 10/12, end 10/15  - Doxycycline 10/15-10/21  - Completed therapy recommendations per ID  Plan:               > Monitor going forward  ???  6. 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  7. 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  8. Hypothyroidism  > Continue Synthroid  ???  9. HLD  > Continue PTA statin  ???  10. HTN  > Continue metoprolol  ???  10. DMII tomorrow  - PTA regimen: NPH 18u QD, Novolog 4u TID w/ meals  > Continue PTA NPH, add on mid dose correction factor  ???  Fluids, electrolytes and Nutrition:  IVF:???no IVF  Electrolytes:???Monitor and replace PRN.  Diet:??????Cardiac Diet  Ppx: DVT:???SCD Stress ulcer:???PPI  ???  Code status: Full Code  Disposition:???contiue on CV1, continue diuresis going forward.    Patient has been seen and discussed with Dr. Lincoln Brigham, MD    ________________________________________________________________________    Subjective  Diane Savage is a 61 y.o. female.  She is doing well this morning and without any new complaints, aside from a desire to stop IV Lasix as it is causing her anxiety having to urinate so often. After discussing it with her it was decided to boluc dose her today then assess how she responds without the drip throughout the day, and she was appreciative of this. She is breathing better and now off of supplemental Oxygen. She will require further diuresis going forward.    Review of Systems: Denies chest pain, SOB, nausea, vomiting, abdominal pain, diarrhea, constipation. + Anxiety     Medications  Scheduled Meds:  aspirin chewable tablet 81 mg 81 mg Oral QDAY   cetirizine (ZYRTEC) tablet 10 mg 10 mg Oral QAM8   diltiazem CD (cardIZEM CD) capsule 180 mg 180 mg Oral QDAY   folic acid (FOLVITE) tablet 1 mg 1 mg Oral QDAY heparin (porcine) injection 2,250-4,500 Units 20-40 Units/kg Intravenous As Prescribed   insulin aspart U-100 (NOVOLOG FLEXPEN) injection PEN 0-14 Units 0-14 Units Subcutaneous ACHS   insulin NPH (HUMULIN N KwikPen) injection PEN 18 Units 18 Units Subcutaneous QDAY(07)   levothyroxine (SYNTHROID) tablet 175 mcg  175 mcg Oral QDAY 30 min before breakfast   magnesium oxide (MAG-OX) tablet 400 mg 400 mg Oral BID   melatonin tablet 5 mg 5 mg Oral QHS   metoprolol tartrate (LOPRESSOR) tablet 100 mg 100 mg Oral BID   nystatin (NYSTOP) topical powder  Topical BID   pantoprazole DR (PROTONIX) tablet 80 mg 80 mg Oral QDAY(21)   rosuvastatin (CRESTOR) tablet 40 mg 40 mg Oral QDAY   senna/docusate (SENOKOT-S) tablet 2 tablet 2 tablet Oral BID   sucralfate (CARAFATE) tablet 1 g 1 g Oral QID   warfarin (COUMADIN) tablet 2.5 mg 2.5 mg Oral QHS   Continuous Infusions:  ??? heparin (porcine) 20,000 Units in dextrose 5% (D5W) 250 mL IV infusion (dbl conc) Stopped (03/24/17 1432)     PRN and Respiratory Meds:acetaminophen Q6H PRN, hydrOXYzine TID PRN, nitroglycerin Q5 MIN PRN, ondansetron Q8H PRN, traMADol Q6H PRN, warfarin QHS **AND** warfarin, pharmacy to manage Per Pharmacy      Objective:                          Vital Signs: Last Filed                 Vital Signs: 24 Hour Range   BP: 143/50 (10/22 1100)  Temp: 36.5 ???C (97.7 ???F) (10/22 1100)  Pulse: 82 (10/22 1258)  Respirations: 14 PER MINUTE (10/22 1100)  SpO2: 94 % (10/22 1100)  O2 Delivery: Nasal Cannula (10/22 1100) BP: (124-148)/(50-78)   Temp:  [36.3 ???C (97.4 ???F)-36.6 ???C (97.9 ???F)]   Pulse:  [82-92]   Respirations:  [14 PER MINUTE-18 PER MINUTE]   SpO2:  [92 %-100 %]   O2 Delivery: Nasal Cannula   Intensity Pain Scale (Self Report): 8 (03/24/17 1540) Vitals:    03/22/17 0500 03/23/17 0400 03/24/17 0305   Weight: 109.1 kg (240 lb 9.6 oz) 110.5 kg (243 lb 9.6 oz) 108 kg (238 lb)       Intake/Output Summary:  (Last 24 hours) Intake/Output Summary (Last 24 hours) at 03/24/17 1552  Last data filed at 03/24/17 1501   Gross per 24 hour   Intake              952 ml   Output             3300 ml   Net            -2348 ml      Stool Occurrence: 1    Physical Exam  General: alert and oriented  Eyes:  Conjunctivae/corneas clear.  EOMI bilaterally, no conjunctival injection  Neck: JVD at 9cm present. HJR+  Lungs: Mild crackles in lower lung fields, otherwise clear throughout   Heart:   Regular rhythm, tachycardic, S1, S2 normal, no appreciable murmur  Abdomen:  Soft, non-tender.  Bowel sounds normal.  No masses.  No organomegaly.  Extremities: Bilateral lower extremities.  2+ pitting edema bilaterally, mildly worse on the left side, there is a scar assumably where the vein graft was harvested, erythema improved, without any purulence noted  Skin: Scar on left leg from saphenous vein graft without purulence, there is erythema, warmth is equal bilaterally  Neurologic:   CNII - XII intact.  Normal strength, sensation and reflexes throughout.  Psych: normal mood and affect    Lab Review  24-hour labs:    Results for orders placed or performed during the hospital encounter of 03/13/17 (from the past 24 hour(s))   POC  GLUCOSE    Collection Time: 03/23/17  8:15 PM   Result Value Ref Range    Glucose, POC 242 (H) 70 - 100 MG/DL   PROTIME INR (PT)    Collection Time: 03/24/17  4:24 AM   Result Value Ref Range    INR 1.5 (H) 0.8 - 1.2   CBC    Collection Time: 03/24/17  4:24 AM   Result Value Ref Range    White Blood Cells 4.3 (L) 4.5 - 11.0 K/UL    RBC 2.54 (L) 4.0 - 5.0 M/UL    Hemoglobin 8.0 (L) 12.0 - 15.0 GM/DL    Hematocrit 54.0 (L) 36 - 45 %    MCV 98.3 80 - 100 FL    MCH 31.7 26 - 34 PG    MCHC 32.3 32.0 - 36.0 G/DL    RDW 98.1 (H) 11 - 15 %    Platelet Count 128 (L) 150 - 400 K/UL    MPV 8.2 7 - 11 FL   BASIC METABOLIC PANEL    Collection Time: 03/24/17  4:24 AM   Result Value Ref Range    Sodium 137 137 - 147 MMOL/L Potassium 3.4 (L) 3.5 - 5.1 MMOL/L    Chloride 95 (L) 98 - 110 MMOL/L    CO2 35 (H) 21 - 30 MMOL/L    Anion Gap 7 3 - 12    Glucose 172 (H) 70 - 100 MG/DL    Blood Urea Nitrogen 44 (H) 7 - 25 MG/DL    Creatinine 1.91 (H) 0.4 - 1.00 MG/DL    Calcium 9.3 8.5 - 47.8 MG/DL    eGFR Non African American 43 (L) >60 mL/min    eGFR African American 51 (L) >60 mL/min   PTT (APTT)    Collection Time: 03/24/17  4:24 AM   Result Value Ref Range    APTT 111.3 (H) 20.0 - 36.0 SEC   MAGNESIUM    Collection Time: 03/24/17  4:24 AM   Result Value Ref Range    Magnesium 1.9 1.6 - 2.6 mg/dL   POC GLUCOSE    Collection Time: 03/24/17  9:03 AM   Result Value Ref Range    Glucose, POC 199 (H) 70 - 100 MG/DL   POC GLUCOSE    Collection Time: 03/24/17  1:48 PM   Result Value Ref Range    Glucose, POC 160 (H) 70 - 100 MG/DL   CBC    Collection Time: 03/24/17  2:10 PM   Result Value Ref Range    White Blood Cells 4.7 4.5 - 11.0 K/UL    RBC 2.59 (L) 4.0 - 5.0 M/UL    Hemoglobin 8.2 (L) 12.0 - 15.0 GM/DL    Hematocrit 29.5 (L) 36 - 45 %    MCV 97.1 80 - 100 FL    MCH 31.8 26 - 34 PG    MCHC 32.7 32.0 - 36.0 G/DL    RDW 62.1 (H) 11 - 15 %    Platelet Count 139 (L) 150 - 400 K/UL    MPV 8.2 7 - 11 FL   PTT (APTT)    Collection Time: 03/24/17  2:10 PM   Result Value Ref Range    APTT 96.5 (H) 20.0 - 36.0 SEC   POC GLUCOSE    Collection Time: 03/24/17  4:17 PM   Result Value Ref Range    Glucose, POC 186 (H) 70 - 100 MG/DL       Point of Care  Testing  (Last 24 hours)  Glucose: (!) 172 (03/24/17 0424)  POC Glucose (Download): (!) 160 (03/24/17 1348)    Radiology and other Diagnostics Review:    Pertinent radiology reviewed.    Rosaria Ferries, MD   Pager [phone]

## 2017-03-25 LAB — IRON + BINDING CAPACITY + %SAT+ FERRITIN
Lab: 23 ug/dL — ABNORMAL LOW (ref 50–160)
Lab: 273 ug/dL — ABNORMAL LOW (ref 270–380)
Lab: 8 % — ABNORMAL LOW (ref 28–42)

## 2017-03-25 LAB — FOLATE, SERUM: Lab: >23 ng/mL (ref >3.9)

## 2017-03-25 LAB — CBC
Lab: 145 10*3/uL — ABNORMAL LOW (ref 150–400)
Lab: 18 % — ABNORMAL HIGH (ref 11–15)
Lab: 25 % — ABNORMAL LOW (ref >60)
Lab: 31 pg (ref 26–34)
Lab: 32 g/dL (ref 32.0–36.0)
Lab: 5.3 K/UL — ABNORMAL LOW (ref 4.5–11.0)
Lab: 8 FL (ref 7–11)
Lab: 8.3 g/dL — ABNORMAL LOW (ref >60)
Lab: 97 FL — ABNORMAL LOW (ref >60)
Lab: 4.5 10*3/uL — ABNORMAL LOW (ref 4.5–11.0)

## 2017-03-25 LAB — POC GLUCOSE
Lab: 185 mg/dL — ABNORMAL HIGH (ref 70–100)
Lab: 150 mg/dL — ABNORMAL HIGH (ref 70–100)
Lab: 138 mg/dL — ABNORMAL HIGH (ref 70–100)
Lab: 273 mg/dL — ABNORMAL HIGH (ref 70–100)

## 2017-03-25 LAB — VITAMIN B12: Lab: 123 pg/mL — ABNORMAL HIGH (ref 180–914)

## 2017-03-25 LAB — PROTIME INR (PT): Lab: 1.6 MMOL/L — ABNORMAL HIGH (ref 0.8–1.2)

## 2017-03-25 LAB — BASIC METABOLIC PANEL: Lab: 135 MMOL/L — ABNORMAL LOW (ref >60)

## 2017-03-25 LAB — CULTURE-BLOOD W/SENSITIVITY

## 2017-03-25 LAB — MAGNESIUM: Lab: 2 mg/dL — ABNORMAL HIGH (ref >60)

## 2017-03-25 LAB — PTT (APTT): Lab: 20 s — ABNORMAL LOW (ref >60)

## 2017-03-25 MED ORDER — FUROSEMIDE 10 MG/ML IJ SOLN
60 mg | Freq: Once | INTRAVENOUS | 0 refills | Status: CP
Start: 2017-03-25 — End: ?
  Administered 2017-03-25: 12:00:00 60 mg via INTRAVENOUS

## 2017-03-25 MED ORDER — MAGNESIUM CITRATE PO SOLN
296 mL | Freq: Once | ORAL | 0 refills | Status: CP
Start: 2017-03-25 — End: ?
  Administered 2017-03-25: 22:00:00 296 mL via ORAL

## 2017-03-25 MED ORDER — MAGNESIUM CITRATE PO SOLN
296 mL | Freq: Once | ORAL | 0 refills | Status: AC
Start: 2017-03-25 — End: ?

## 2017-03-25 MED ORDER — POTASSIUM CHLORIDE 20 MEQ PO TBTQ
60 meq | Freq: Once | ORAL | 0 refills | Status: DC
Start: 2017-03-25 — End: 2017-03-25

## 2017-03-25 NOTE — Progress Notes
Heart Failure Nursing Progress Note    Admission Date: 03/13/2017  LOS: 12 days    Admission Weight: 113.8 kg (250 lb 12.8 oz)        Most recent weights (inpatient):   Vitals:    03/23/17 0400 03/24/17 0305 03/25/17 0510   Weight: 110.5 kg (243 lb 9.6 oz) 108 kg (238 lb) 106.4 kg (234 lb 9.6 oz)     Weight change from previous day:-1.6 kg    Fluid restriction ordered: None at this time    Intake/Output Summary: (Last 24 hours)    Intake/Output Summary (Last 24 hours) at 03/25/17 0525  Last data filed at 03/25/17 0230   Gross per 24 hour   Intake             1002 ml   Output             2050 ml   Net            -1048 ml       Is patient incontinent no, but inaccurate due to bowel prep    Anticipated discharge date: TBD  Discharge goals: TBD      Daily Assessment of Patient Stated Goals:    Short Term Goal Identified by patient (Short Term=during hospitalization):  Get out of here.

## 2017-03-25 NOTE — Progress Notes
Administered mag citrate.

## 2017-03-25 NOTE — Progress Notes
OCCUPATIONAL THERAPY  NO TREATMENT NOTE     The patient was not seen due to: Patient declined despite encouragement or education. Occupational therapy will continue to follow and provide intervention as indicated.      Therapist: Rogers Seeds OTR/L [id number]  Date: 03/25/2017

## 2017-03-25 NOTE — Progress Notes
PHYSICAL THERAPY  NOTE         Patient declined to participate despite encouragement and education about the role and benefits of physical therapy. Patient sleeping upon PT arrival. Patient difficult to keep awake but declines mobility at this time due to scheduled colonoscopy this afternoon. Physical therapy will continue to follow and provide intervention as indicated.    Therapist: Alden Benjamin, PT  Date: 03/25/2017

## 2017-03-25 NOTE — Progress Notes
General Progress Note    Name:  Diane Savage   ZOXWR'U Date:  03/25/2017  Admission Date: 03/13/2017  LOS: 12 days                     Assessment/Plan:    Principal Problem:    Atrial fibrillation with rapid ventricular response (HCC)  Active Problems:    Type 2 diabetes mellitus with circulatory disorder, without long-term current use of insulin (HCC)    PAF (paroxysmal atrial fibrillation) (HCC)    Chronic gastrointestinal bleeding    Transfusion-dependent anemia    CAD (coronary artery disease), native coronary artery    HLD (hyperlipidemia)    Hypothyroidism    AKI (acute kidney injury) (HCC)    Essential hypertension    Pleural effusion on left    Left leg cellulitis    Barrett's esophagus    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  ???  1. 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 >???Continue ASA and Metoprolol Tartrate 100mg  BID               > Diltiazem 180 mg daily > on heparin drip as bridge to warfarin - both being held w/ new onset BRBPR as of 10/22               > warfarin 2.5 mg, monitor INR daily going forward (1.6 today) - will restart after colonoscopy scheduled 10/24 in AM  ???  ???  2. Shortness of Breath/Lower extremity swelling, volume overload  - LLE swelling greater than right since surgery  - has history of DVT  - BNP 327 on admission   - Daily CXR with persistent left sided pleural effusion   - IR drain and Chest tube on left side (removed 10/17)  - OPxygen requirement titrated down, on room air 03/24/2017  - Lasix drip d/c 10/22 - tranistion to Iv Bolus  Plan for today:               > Lasix 60 IV given in AM, will dose daily according to volume status    > Will need daily diuresis plan going forward    > Daily Weight, accurate I/O needed    3. 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 Will schedule in AM for 10/24  - Heparin Drip paused, Warfarin held  - Bowel Prep w/ Magnesium Citrate and Docusate    4.  Acute kidney injury, improving  -FE urea 8.4%, consistent with prerenal etiology on admission  -Improving with diuresis, consistent with cardiorenal syndrome  -Renal ultrasound unremarkable  -Urine eosinophils negative  Plan:               > Avoid nephrotoxic medications               > Nephrology consulted - recommend continued diuresis               > Continue Diuresis w/ Iv Bolus Lasix  ???  5.  Altered mental status, improved -Hypercapnia versus uremia versus infectious  -Has been lethargic and confused for the past 3 days now  -Wanted to leave AMA multiple times  Plan:               >AOx3 today w/o abnormality  ???  6.  Cellulitis of lower extremity, left, improved  - Amoxicillin started on 10/12, end 10/15  - Doxycycline 10/15-10/21  - Completed therapy recommendations per ID  Plan:               > Monitor going forward  ???  7. 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  8. 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  8. Hypothyroidism  > Continue Synthroid  ???  9. HLD  > Continue PTA statin  ???  10. HTN  > Continue metoprolol  ???  10. DMII tomorrow  - PTA regimen: NPH 18u QD, Novolog 4u TID w/ meals  > Continue PTA NPH, MDCF  ???  Fluids, electrolytes and Nutrition:  IVF:???no IVF  Electrolytes:???Monitor and replace PRN.  Diet:???NPO for Colonscopy today   Ppx: DVT:???SCD Stress ulcer:???PPI  ???  Code status: Full Code  Disposition:???contiue on CV1, continue diuresis going forward. Plan for Colonoscopy in AM    Patient has been seen and discussed with Dr. Lincoln Brigham, MD    ________________________________________________________________________    Subjective Diane Savage is a 61 y.o. female. Diane Savage explained her one large bowel movement with bright red blood yesterday afternoon, which was followed throughout the night with multiple other small bowel movements with speckled blood on them. She is anxious to have the colonscopy performed, and does not enjoy the taste of the prep, nor the required number of bowel movements. Unfortunately, her colonscopy  had to be rescheduled for tomorrow morning, and she was not pleased by this. We discussed her continued diuresis and the plan now to have colonoscopy performed in the AM    Review of Systems: Denies chest pain, SOB, nausea, vomiting, abdominal pain, diarrhea, constipation. + Anxiety     Medications  Scheduled Meds:    aspirin chewable tablet 81 mg 81 mg Oral QDAY   cetirizine (ZYRTEC) tablet 10 mg 10 mg Oral QAM8   diltiazem CD (cardIZEM CD) capsule 180 mg 180 mg Oral QDAY   folic acid (FOLVITE) tablet 1 mg 1 mg Oral QDAY   furosemide (LASIX) injection 60 mg 60 mg Intravenous ONCE   heparin (porcine) injection 2,250-4,500 Units 20-40 Units/kg Intravenous As Prescribed   insulin aspart U-100 (NOVOLOG FLEXPEN) injection PEN 0-14 Units 0-14 Units Subcutaneous ACHS   insulin NPH (HUMULIN N KwikPen) injection PEN 18 Units 18 Units Subcutaneous QDAY(07)   levothyroxine (SYNTHROID) tablet 175 mcg 175 mcg Oral QDAY 30 min before breakfast   magnesium oxide (MAG-OX) tablet 400 mg 400 mg Oral BID   melatonin tablet 5 mg 5 mg Oral QHS   metoprolol tartrate (LOPRESSOR) tablet 100 mg 100 mg Oral BID   nystatin (NYSTOP) topical powder  Topical BID   pantoprazole DR (PROTONIX) tablet 80 mg 80 mg Oral QDAY(21)   rosuvastatin (CRESTOR) tablet 40 mg 40 mg Oral QDAY   senna/docusate (SENOKOT-S) tablet 2 tablet 2 tablet Oral BID   sucralfate (CARAFATE) tablet 1 g 1 g Oral QID   warfarin (COUMADIN) tablet 2.5 mg 2.5 mg Oral QHS   Continuous Infusions:  ??? heparin (porcine) 20,000 Units in dextrose 5% (D5W) 250 mL IV infusion (dbl conc) Stopped (03/24/17 1432)     PRN and Respiratory Meds:acetaminophen Q6H PRN, hydrOXYzine TID PRN, nitroglycerin Q5 MIN PRN, ondansetron Q8H PRN, traMADol Q6H PRN, warfarin QHS **AND** warfarin, pharmacy to manage Per Pharmacy      Objective:                          Vital Signs: Last Filed                 Vital Signs: 24 Hour Range   BP: 109/42 (10/23 0230)  Temp: 36.6 ???C (97.9 ???F) (10/23 0230)  Pulse: 82 (10/23 0230)  Respirations: 15 PER MINUTE (10/23 0230)  SpO2: 97 % (10/23 0230)  O2 Delivery: Nasal Cannula (10/23 0230) BP: (103-143)/(42-78)   Temp:  [36.1 ???C (97 ???F)-36.9 ???C (98.4 ???F)]   Pulse:  [82-103]   Respirations:  [14 PER MINUTE-17 PER MINUTE]   SpO2:  [86 %-97 %]   O2 Delivery: Nasal Cannula   Intensity Pain Scale (Self Report): 6 (03/24/17 2100) Vitals:    03/23/17 0400 03/24/17 0305 03/25/17 0510   Weight: 110.5 kg (243 lb 9.6 oz) 108 kg (238 lb) 106.4 kg (234 lb 9.6 oz)       Intake/Output Summary:  (Last 24 hours)    Intake/Output Summary (Last 24 hours) at 03/25/17 0624  Last data filed at 03/25/17 0611   Gross per 24 hour   Intake             1102 ml   Output             2050 ml   Net             -948 ml      Stool Occurrence: 1    Physical Exam  General: alert and oriented  Eyes:  Conjunctivae/corneas clear.  EOMI bilaterally, no conjunctival injection  Neck: JVD at 9cm present. HJR+  Lungs: Mild crackles in lower lung fields, otherwise clear throughout   Heart:   Regular rhythm, tachycardic, S1, S2 normal, no appreciable murmur  Abdomen:  Soft, non-tender.  Bowel sounds normal.  No masses.  No organomegaly.  Extremities: Bilateral lower extremities.  2+ pitting edema bilaterally, mildly worse on the left side, there is a scar assumably where the vein graft was harvested, erythema improved, without any purulence noted  Skin: Scar on left leg from saphenous vein graft without purulence, there is erythema, warmth is equal bilaterally Neurologic:   CNII - XII intact.  Normal strength, sensation and reflexes throughout.  Psych: normal mood and affect    Lab Review  24-hour labs:    Results for orders placed or performed during the hospital encounter of 03/13/17 (from the past 24 hour(s))   POC GLUCOSE    Collection Time: 03/24/17  9:03 AM   Result Value Ref Range    Glucose, POC 199 (H) 70 - 100 MG/DL   POC GLUCOSE    Collection Time: 03/24/17  1:48 PM   Result Value Ref Range    Glucose, POC 160 (H) 70 - 100 MG/DL   CBC    Collection Time: 03/24/17  2:10 PM   Result Value Ref Range    White Blood Cells 4.7 4.5 - 11.0 K/UL    RBC 2.59 (L) 4.0 - 5.0 M/UL    Hemoglobin 8.2 (L) 12.0 - 15.0 GM/DL    Hematocrit 96.0 (L) 36 - 45 %    MCV 97.1 80 - 100 FL    MCH 31.8 26 - 34 PG    MCHC 32.7 32.0 - 36.0 G/DL    RDW 45.4 (H) 11 - 15 %    Platelet Count 139 (L) 150 - 400 K/UL    MPV 8.2 7 - 11 FL   PTT (APTT)    Collection Time: 03/24/17  2:10 PM   Result Value Ref Range    APTT 96.5 (H) 20.0 - 36.0 SEC   POC GLUCOSE    Collection Time: 03/24/17  4:17 PM   Result Value Ref Range    Glucose, POC 186 (H) 70 - 100 MG/DL   POC GLUCOSE    Collection Time: 03/24/17  9:00 PM   Result Value Ref Range    Glucose, POC 185 (H) 70 - 100 MG/DL   CBC    Collection Time: 03/24/17  9:48 PM   Result Value Ref Range    White Blood Cells 5.3 4.5 - 11.0 K/UL    RBC 2.64 (L) 4.0 - 5.0 M/UL    Hemoglobin 8.3 (L) 12.0 - 15.0 GM/DL    Hematocrit 09.8 (L) 36 - 45 %    MCV 97.5 80 - 100 FL    MCH 31.2 26 - 34 PG    MCHC 32.0 32.0 - 36.0 G/DL    RDW 11.9 (H) 11 - 15 %    Platelet Count 145 (L) 150 - 400 K/UL    MPV 8.0 7 - 11 FL   PROTIME INR (PT)    Collection Time: 03/25/17  3:00 AM   Result Value Ref Range    INR 1.6 (H) 0.8 - 1.2   CBC    Collection Time: 03/25/17  3:00 AM   Result Value Ref Range    White Blood Cells 4.5 4.5 - 11.0  K/UL    RBC 2.58 (L) 4.0 - 5.0 M/UL    Hemoglobin 8.2 (L) 12.0 - 15.0 GM/DL    Hematocrit 16.1 (L) 36 - 45 %    MCV 96.2 80 - 100 FL MCH 32.0 26 - 34 PG    MCHC 33.2 32.0 - 36.0 G/DL    RDW 09.6 (H) 11 - 15 %    Platelet Count 132 (L) 150 - 400 K/UL    MPV 8.9 7 - 11 FL   BASIC METABOLIC PANEL    Collection Time: 03/25/17  3:00 AM   Result Value Ref Range    Sodium 135 (L) 137 - 147 MMOL/L    Potassium 4.3 3.5 - 5.1 MMOL/L    Chloride 95 (L) 98 - 110 MMOL/L    CO2 34 (H) 21 - 30 MMOL/L    Anion Gap 6 3 - 12    Glucose 145 (H) 70 - 100 MG/DL    Blood Urea Nitrogen 38 (H) 7 - 25 MG/DL    Creatinine 0.45 (H) 0.4 - 1.00 MG/DL    Calcium 9.6 8.5 - 40.9 MG/DL    eGFR Non African American 39 (L) >60 mL/min    eGFR African American 47 (L) >60 mL/min   PTT (APTT)    Collection Time: 03/25/17  3:00 AM   Result Value Ref Range    APTT 20.4 20.0 - 36.0 SEC       Point of Care Testing  (Last 24 hours)  Glucose: (!) 145 (03/25/17 0300)  POC Glucose (Download): (!) 185 (03/24/17 2100)    Radiology and other Diagnostics Review:    Pertinent radiology reviewed.    Rosaria Ferries, MD   Pager [phone]

## 2017-03-25 NOTE — Progress Notes
RT Adult Assessment Note    NAME:Diane Savage             MRN: [id number]             DOB:11-24-1955          AGE: 61 y.o.  ADMISSION DATE: 03/13/2017             DAYS ADMITTED: LOS: 12 days    RT Treatment Plan:       Protocol Plan: Procedures  PEP Therapy: Place a nursing order for "IS Q1h While Awake" for any of Lung Expansion indicators  PAP: Q4h PAP While Awake  Oxygen/Humidity: O2 to keep SpO2 > 92%  Monitoring: Pulse oximetry BID & PRN    Additional Comments:  Impressions of the patient: poor IS effort. Continue EzPAP lung expansion.     Vital Signs:  Pulse: Pulse: 77  RR: Respirations: 17 PER MINUTE  SpO2: SpO2: 93 %  O2 Device: $$ O2 Device: (S) Standby  Liter Flow: O2 Liter Flow:  (RA)  O2%:    Breath Sounds:    Respiratory Effort: Respiratory Effort: Non-Labored

## 2017-03-25 NOTE — Progress Notes
1935 GI paged, patient refusing to continue with bowel prep at this time. Stated if they can't do her procedure tomorrow then that's fine she's not going to keep drinking mag citrate. Team notified and stated to continue to encourage her to drink throughout the night as she would and they will come by and re-evaluate in the morning.    Patient given PRN emetics and encouraged to continue trying to take sips as she could. She stated that she might try later tonight if she felt like it. Will continue to encourage patient to drink.

## 2017-03-25 NOTE — Progress Notes
Renal Progress Note    Name:  Diane Savage   GURKY'H Date:  03/25/2017  Admission Date: 03/13/2017  LOS: 12 days          Assessment and Plan   Principal Problem:    Atrial fibrillation with rapid ventricular response (HCC)  Active Problems:    Type 2 diabetes mellitus with circulatory disorder, without long-term current use of insulin (HCC)    PAF (paroxysmal atrial fibrillation) (HCC)    Chronic gastrointestinal bleeding    Transfusion-dependent anemia    CAD (coronary artery disease), native coronary artery    HLD (hyperlipidemia)    Hypothyroidism    AKI (acute kidney injury) (HCC)    Essential hypertension    Pleural effusion on left    Left leg cellulitis    Barrett's esophagus    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 female s/p CABG admitted for dyspnea and volume overload.  ???  AKI  -no significant CKD  -renal ultrasound without hydronephrosis  -no recent nephrotoxins  -creatinine progressively increasing  -I&O appear inaccurate  -FeUrea 8% suggesting renal hypoperfusion  ???  Atrial flutter  Leukocytosis  CAD s/p CABG  Anemia  HTN  Hyponatremia, hypervolemic  Encephalopathy, likely related to hypercapnea  ???  Recommendations:  -renal function at baseline  -monitor I&O, diuretics to keep I&O balacnced  -strict I&O, standing daily wieght  -please call with questions or concerns  ???  [name], MD  PGY-5 Nephrology Fellow  Pager [phone]  ???      Subjective     Diane Savage is a 61 y.o. female GI consulted yesterday, planning for colonoscopy today, creatinine stable.      Medications     Medications    MEDS    aspirin 81 mg Oral QDAY   cetirizine 10 mg Oral QAM8   diltiazem CD 180 mg Oral QDAY   folic acid 1 mg Oral QDAY   heparin (porcine) 20-40 Units/kg Intravenous As Prescribed   insulin aspart U-100 0-14 Units Subcutaneous ACHS   insulin NPH 18 Units Subcutaneous QDAY(07) levothyroxine 175 mcg Oral QDAY 30 min before breakfast   magnesium oxide 400 mg Oral BID   melatonin 5 mg Oral QHS   metoprolol tartrate 100 mg Oral BID   nystatin  Topical BID   pantoprazole DR 80 mg Oral QDAY(21)   rosuvastatin 40 mg Oral QDAY   senna/docusate 2 tablet Oral BID   sucralfate 1 g Oral QID   warfarin 2.5 mg Oral QHS    IV MEDS  ??? heparin (porcine) 20,000 Units in dextrose 5% (D5W) 250 mL IV infusion (dbl conc) Stopped (03/24/17 1432)     Prn acetaminophen Q6H PRN 650 mg at 03/22/17 1814, hydrOXYzine TID PRN 25 mg at 03/25/17 0558, nitroglycerin Q5 MIN PRN, ondansetron Q8H PRN 8 mg at 03/24/17 1943, traMADol Q6H PRN 50 mg at 03/24/17 1541, warfarin QHS 2.5 mg at 03/23/17 2012 **AND** warfarin, pharmacy to manage Per Pharmacy           Physical Exam     Vital Signs: Last Filed In 24 Hours Vital Signs: 24 Hour Range   BP: 119/56 (10/23 1156)  Temp: 36.3 ???C (97.4 ???F) (10/23 1156)  Pulse: 83 (10/23 1156)  Respirations: 18 PER MINUTE (10/23 1156)  SpO2: 95 % (10/23 1156)  O2 Delivery: Nasal Cannula (10/23 1156) BP: (103-134)/(42-76)   Temp:  [  36.1 ???C (97 ???F)-36.9 ???C (98.4 ???F)]   Pulse:  [80-103]   Respirations:  [14 PER MINUTE-18 PER MINUTE]   SpO2:  [86 %-97 %]   O2 Delivery: Nasal Cannula   Intensity Pain Scale (Self Report): 6 (03/24/17 2100)        Intake/Output Summary (Last 24 hours) at 03/25/17 1318  Last data filed at 03/25/17 1203   Gross per 24 hour   Intake              450 ml   Output             1900 ml   Net            -1450 ml      Vitals:    03/23/17 0400 03/24/17 0305 03/25/17 0510   Weight: 110.5 kg (243 lb 9.6 oz) 108 kg (238 lb) 106.4 kg (234 lb 9.6 oz)     ???  Gen: drowsy, mild respiratory distress   HEENT: Sclera normal; MMM   CV:no JVD, regular rhythm  Pulm: Clear to Auscultation bilateral, diminished  GI: BS+ x4, non-tender to palpation  Neuro: drowsy, moving all ext  Ext: trace ble edema, no clubbing or cyanosis   Skin: no rash, dry      Labs:      Recent Labs      03/23/17 0434  03/24/17   0424  03/25/17   0300   NA  137  137  135*   K  3.4*  3.4*  4.3   CL  98  95*  95*   CO2  33*  35*  34*   GAP  6  7  6    BUN  50*  44*  38*   CR  1.50*  1.28*  1.38*   GLU  133*  172*  145*   CA  9.4  9.3  9.6   MG  1.7  1.9  2.0       Recent Labs      03/22/17   2011  03/23/17   0434  03/24/17   0424  03/24/17   1410  03/24/17   2148  03/25/17   0300   WBC   --   4.6  4.3*  4.7  5.3  4.5   HGB  8.1*  8.1*  8.0*  8.2*  8.3*  8.2*   HCT  24.1*  24.9*  24.9*  25.2*  25.7*  24.8*   PLTCT   --   146*  128*  139*  145*  132*   INR   --   1.4*  1.5*   --    --   1.6*   PTT   --   105.4*  111.3*  96.5*   --   20.4      Estimated Creatinine Clearance: 51.6 mL/min (A) (based on SCr of 1.38 mg/dL (H)).  Vitals:    03/23/17 0400 03/24/17 0305 03/25/17 0510   Weight: 110.5 kg (243 lb 9.6 oz) 108 kg (238 lb) 106.4 kg (234 lb 9.6 oz)      No results for input(s): PHART, PO2ART in the last 72 hours.    Invalid input(s): PC02A

## 2017-03-26 ENCOUNTER — Encounter: Admit: 2017-03-26 | Discharge: 2017-03-26 | Payer: MEDICARE

## 2017-03-26 DIAGNOSIS — I4891 Unspecified atrial fibrillation: Secondary | ICD-10-CM

## 2017-03-26 DIAGNOSIS — K319 Disease of stomach and duodenum, unspecified: ICD-10-CM

## 2017-03-26 DIAGNOSIS — D699 Hemorrhagic condition, unspecified: ICD-10-CM

## 2017-03-26 DIAGNOSIS — E119 Type 2 diabetes mellitus without complications: ICD-10-CM

## 2017-03-26 DIAGNOSIS — I2510 Atherosclerotic heart disease of native coronary artery without angina pectoris: Principal | ICD-10-CM

## 2017-03-26 DIAGNOSIS — M1990 Unspecified osteoarthritis, unspecified site: ICD-10-CM

## 2017-03-26 DIAGNOSIS — M549 Dorsalgia, unspecified: ICD-10-CM

## 2017-03-26 DIAGNOSIS — I10 Essential (primary) hypertension: Secondary | ICD-10-CM

## 2017-03-26 DIAGNOSIS — E039 Hypothyroidism, unspecified: ICD-10-CM

## 2017-03-26 DIAGNOSIS — H547 Unspecified visual loss: ICD-10-CM

## 2017-03-26 DIAGNOSIS — I5032 Chronic diastolic (congestive) heart failure: ICD-10-CM

## 2017-03-26 LAB — POC GLUCOSE
Lab: 165 mg/dL — ABNORMAL HIGH (ref >60)
Lab: 133 mg/dL — ABNORMAL HIGH (ref 70–100)
Lab: 141 mg/dL — ABNORMAL HIGH (ref 70–100)
Lab: 261 mg/dL — ABNORMAL HIGH (ref 70–100)

## 2017-03-26 LAB — PROTIME INR (PT): Lab: 1.6 MMOL/L — ABNORMAL HIGH (ref 0.8–1.2)

## 2017-03-26 LAB — PTT (APTT): Lab: 29 s — ABNORMAL HIGH (ref >60)

## 2017-03-26 LAB — CBC: Lab: 4.4 K/UL — ABNORMAL LOW (ref 4.5–11.0)

## 2017-03-26 LAB — MAGNESIUM: Lab: 2.3 mg/dL — ABNORMAL HIGH (ref >60)

## 2017-03-26 LAB — BASIC METABOLIC PANEL: Lab: 137 MMOL/L — ABNORMAL LOW (ref >60)

## 2017-03-26 MED ORDER — LACTATED RINGERS IV SOLP
1000 mL | INTRAVENOUS | 0 refills | Status: DC
Start: 2017-03-26 — End: 2017-03-26
  Administered 2017-03-26: 16:00:00 1000 mL via INTRAVENOUS
  Administered 2017-03-26: 17:00:00 1000.000 mL via INTRAVENOUS

## 2017-03-26 MED ORDER — DEXAMETHASONE SODIUM PHOSPHATE 4 MG/ML IJ SOLN
4 mg | Freq: Once | INTRAVENOUS | 0 refills | Status: AC | PRN
Start: 2017-03-26 — End: ?

## 2017-03-26 MED ORDER — SODIUM CHLORIDE 0.9 % IV SOLP
INTRAVENOUS | 0 refills | Status: DC
Start: 2017-03-26 — End: 2017-03-26

## 2017-03-26 MED ORDER — PRAMOXINE-ZINC OXIDE 1-5 % TP CREA
Freq: Every day | RECTAL | 0 refills | Status: DC | PRN
Start: 2017-03-26 — End: 2017-03-28

## 2017-03-26 MED ORDER — LIDOCAINE (PF) 200 MG/10 ML (2 %) IJ SYRG
0 refills | Status: DC
Start: 2017-03-26 — End: 2017-03-26
  Administered 2017-03-26: 17:00:00 100 mg via INTRAVENOUS

## 2017-03-26 MED ORDER — PROPOFOL INJ 10 MG/ML IV VIAL
0 refills | Status: DC
Start: 2017-03-26 — End: 2017-03-26
  Administered 2017-03-26: 17:00:00 100 mg via INTRAVENOUS

## 2017-03-26 MED ORDER — PHENYLEPHRINE IN 0.9% NACL(PF) 1 MG/10 ML (100 MCG/ML) IV SYRG
0 refills | Status: DC
Start: 2017-03-26 — End: 2017-03-26
  Administered 2017-03-26 (×2): 200 ug via INTRAVENOUS
  Administered 2017-03-26 (×2): 100 ug via INTRAVENOUS

## 2017-03-26 MED ORDER — ONDANSETRON HCL (PF) 4 MG/2 ML IJ SOLN
0 refills | Status: DC
Start: 2017-03-26 — End: 2017-03-26
  Administered 2017-03-26: 17:00:00 4 mg via INTRAVENOUS

## 2017-03-26 MED ORDER — DIPHENHYDRAMINE HCL 25 MG PO CAP
25 mg | Freq: Once | ORAL | 0 refills | Status: CP
Start: 2017-03-26 — End: ?
  Administered 2017-03-27: 02:00:00 25 mg via ORAL

## 2017-03-26 MED ORDER — SUCCINYLCHOLINE CHLORIDE 20 MG/ML IJ SOLN
0 refills | Status: DC
Start: 2017-03-26 — End: 2017-03-26
  Administered 2017-03-26: 17:00:00 100 mg via INTRAVENOUS

## 2017-03-26 MED ORDER — FENTANYL CITRATE (PF) 50 MCG/ML IJ SOLN
50 ug | INTRAVENOUS | 0 refills | Status: DC | PRN
Start: 2017-03-26 — End: 2017-03-27

## 2017-03-26 NOTE — Progress Notes
CV1 notified that pt has only had 2 bm this shift and stated she would not be doing anymore of her bowel prep. Pt's stools have still been loose, bloody with some feces. Pt stated she did not like the taste and her bottom was hurting. Pt given barrier cream for bottom. Team stated they would pass along to day shift. Pt has mag citrate ordered for the morning. Will attempt to administer to patient. Also notified resident that pt was requesting additional medication for sleep. Pt given atarax and melatonin. No new orders at this time. Will continue to monitor.

## 2017-03-26 NOTE — Procedures
Endoscopy Report    Please refer to full ProVation MD report in results tab.    Summary of findings:  - Preparation of the colon was poor.   - The examined portion of the ileum was normal.   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  - One 5 mm polyp in the transverse colon.  Resection not attempted.   - Erythematous flat lesion in the transverse colon.   - Diverticulosis in the sigmoid colon. Not bleeding.  - Non-bleeding external hemorrhoids.   - No specimens collected.     Recommendations:  - Resume previous diet.   - Continue present medications.   - Continue to monitor for overt signs of bleeding.  - Consider hemorrhoidal cream  - Repeat colonoscopy at appointment to be scheduled due to suboptimal preparation, once acute issues are stabilized.

## 2017-03-26 NOTE — Progress Notes
Colon/Lower EUS/Retrograde Enteroscopy     Post Lower Endoscopy Instructions    -If you feel feverish, have a temperature of 101 degrees or higher, persistent nausea and vomiting, abdominal pain or dark stools; please notify your nurse or GI physician.    -You may have abdominal cramping following the procedure this can be relieved by belching or passing air.    -If you have redness or swelling at the IV site, place a warm, wet washcloth over the affected areas for 15 minutes, 3-4 times a day until the redness subsides.  If symptoms continue for 2-3 days, contact your regular physician.    - If you have bleeding from your bowels over 2 tablespoons and increasing, please notify your physician.  A small amount of bleeding is normal if a biopsy or polyps were taken.    - You may resume all your routine medications, if medications need to be held your physician and/or nurse will notify you post procedure.    SPECIFIC INSTRUCTIONS  INPATIENTS:  Ask for help when you get up in your room, as you may still be drowsy from your sedation.    OUTPATIENTS:  A. Because of sedation and lack of coordination, UNTIL TOMORROW, DO NOT:  1. Operate any motorized vehicle - this includes driving.  2. Sign any legal documents or conduct important business matters.  3. Use any dangerous machinery (chain saw, lawnmower, etc.).  4. Drink any alcoholic beverages.  Should you have any questions or concerns after your procedure please call [phone] M-F 8am-5:00 pm. After 5:00 pm, holidays or weekends call [phone] and ask for the GI Doctor on call.

## 2017-03-26 NOTE — Anesthesia Post-Procedure Evaluation
Post-Anesthesia Evaluation    Name: Diane Savage      MRN: [id number]     DOB: 25-Sep-1955     Age: 61 y.o.     Sex: female   __________________________________________________________________________     Procedure Date: 03/26/2017  Procedure: Procedure(s):  COLONOSCOPY      Surgeon: Surgeon(s):  Kerman Passey, MD  Moishe Spice, MD    Post-Anesthesia Vitals  BP: 125/71 (10/24 1325)  Temp: 36.6 C (97.9 F) (10/24 1302)  Pulse: 85 (10/24 1325)  Respirations: 19 PER MINUTE (10/24 1325)  SpO2: 92 % (10/24 1325)  O2 Delivery: Simple Mask (10/24 1307)  SpO2 Pulse: 86 (10/24 1325)      Post Anesthesia Evaluation Note    Evaluation location: pre/post  Patient participation: recovered; patient participated in evaluation  Level of consciousness: alert    Pain score: 0  Pain management: adequate    Hydration: normovolemia  Temperature: 36.0C - 38.4C  Airway patency: adequate    Perioperative Events  Perioperative events:  no      Postoperative Status  Cardiovascular status: hemodynamically stable  Respiratory status: spontaneous ventilation        Perioperative Events  Perioperative Event: No  Emergency Case Activation: No

## 2017-03-26 NOTE — Progress Notes
OCCUPATIONAL THERAPY  NO TREATMENT NOTE     The patient was not seen due to: Patient not available - patient off unit for colonoscopy. Attempted in the afternoon but patient declined to participate despite encouragement or education. Occupational therapy will continue to follow and provide intervention as indicated.      Therapist: Rogers Seeds OTR/L [id number]  Date: 03/26/2017

## 2017-03-26 NOTE — Progress Notes
Renal Progress Note    Name:  Diane Savage   [id number] Date:  03/26/2017  Admission Date: 03/13/2017  LOS: 13 days          Assessment and Plan   Principal Problem:    Atrial fibrillation with rapid ventricular response (HCC)  Active Problems:    Type 2 diabetes mellitus with circulatory disorder, without long-term current use of insulin (HCC)    PAF (paroxysmal atrial fibrillation) (HCC)    Chronic gastrointestinal bleeding    Transfusion-dependent anemia    CAD (coronary artery disease), native coronary artery    HLD (hyperlipidemia)    Hypothyroidism    AKI (acute kidney injury) (HCC)    Essential hypertension    Pleural effusion on left    Left leg cellulitis    Barrett's esophagus    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 female s/p CABG admitted for dyspnea and volume overload.  ???  AKI  -no significant CKD  -renal ultrasound without hydronephrosis  -no recent nephrotoxins  -creatinine progressively increasing  -I&O appear inaccurate  -FeUrea 8% suggesting renal hypoperfusion  ???  Atrial flutter  Leukocytosis  CAD s/p CABG  Anemia  HTN  Hyponatremia, hypervolemic  Encephalopathy, likely related to hypercapnea  ???  Recommendations:  -renal function at baseline  -continue to hold diuretics  -please call with questions or concerns  ???  [name], MD  PGY-5 Nephrology Fellow  Pager [phone]  ???      Subjective     Diane Savage is a 61 y.o. female endoscopy today, renal function stable, no events overnight, patient would very much like to discharge, she has been inpatient for almost 2 weeks.      Medications     Medications    MEDS    aspirin 81 mg Oral QDAY   cetirizine 10 mg Oral QAM8   diltiazem CD 180 mg Oral QDAY   folic acid 1 mg Oral QDAY   insulin aspart U-100 0-14 Units Subcutaneous ACHS   insulin NPH 18 Units Subcutaneous QDAY(07)   levothyroxine 175 mcg Oral QDAY 30 min before breakfast magnesium citrate 296 mL Oral ONCE   magnesium oxide 400 mg Oral BID   melatonin 5 mg Oral QHS   metoprolol tartrate 100 mg Oral BID   nystatin  Topical BID   pantoprazole DR 80 mg Oral QDAY(21)   rosuvastatin 40 mg Oral QDAY   senna/docusate 2 tablet Oral BID   sucralfate 1 g Oral QID    IV MEDS    Prn acetaminophen Q6H PRN 650 mg at 03/22/17 1814, dexamethasone  (DECADRON)   IV Once PRN, fentaNYL citrate PF Q5 MIN PRN, hydrOXYzine TID PRN 25 mg at 03/25/17 2125, nitroglycerin Q5 MIN PRN, ondansetron Q8H PRN 8 mg at 03/24/17 1943, pramoxine/zinc oxide QDAY PRN, traMADol Q6H PRN 50 mg at 03/24/17 1541           Physical Exam     Vital Signs: Last Filed In 24 Hours Vital Signs: 24 Hour Range   BP: 123/45 (10/24 1613)  Temp: 36.3 ???C (97.4 ???F) (10/24 1613)  Pulse: 93 (10/24 1613)  Respirations: 16 PER MINUTE (10/24 1613)  SpO2: 96 % (10/24 1613)  O2 Delivery: None (Room Air) (10/24 1613)  SpO2 Pulse: 86 (10/24 1325) BP: (115-134)/(43-78)   Temp:  [36.3 ???C (97.4 ???F)-36.6 ???C (97.9 ???F)]   Pulse:  [  80-94]   Respirations:  [14 PER MINUTE-23 PER MINUTE]   SpO2:  [90 %-97 %]   O2 Delivery: None (Room Air)   Intensity Pain Scale (Self Report): 8 (03/26/17 1101)        Intake/Output Summary (Last 24 hours) at 03/26/17 1838  Last data filed at 03/26/17 1414   Gross per 24 hour   Intake              540 ml   Output              200 ml   Net              340 ml      Vitals:    03/23/17 0400 03/24/17 0305 03/25/17 0510   Weight: 110.5 kg (243 lb 9.6 oz) 108 kg (238 lb) 106.4 kg (234 lb 9.6 oz)     ???  Gen: drowsy, mild respiratory distress   HEENT: Sclera normal; MMM   CV:no JVD, regular rhythm  Pulm: Clear to Auscultation bilateral, diminished  GI: BS+ x4, non-tender to palpation  Neuro: drowsy, moving all ext  Ext: trace ble edema, no clubbing or cyanosis   Skin: no rash, dry      Labs:      Recent Labs      03/24/17   0424  03/25/17   0300  03/26/17   0425   NA  137  135*  137   K  3.4*  4.3  4.3   CL  95*  95*  93* CO2  35*  34*  37*   GAP  7  6  7    BUN  44*  38*  34*   CR  1.28*  1.38*  1.32*   GLU  172*  145*  129*   CA  9.3  9.6  10.1   MG  1.9  2.0  2.3       Recent Labs      03/24/17   0424  03/24/17   1410  03/24/17   2148  03/25/17   0300  03/26/17   0425   WBC  4.3*  4.7  5.3  4.5  4.4*   HGB  8.0*  8.2*  8.3*  8.2*  9.1*   HCT  24.9*  25.2*  25.7*  24.8*  27.5*   PLTCT  128*  139*  145*  132*  130*   INR  1.5*   --    --   1.6*  1.6*   PTT  111.3*  96.5*   --   20.4  29.1      Estimated Creatinine Clearance: 53.9 mL/min (A) (based on SCr of 1.32 mg/dL (H)).  Vitals:    03/23/17 0400 03/24/17 0305 03/25/17 0510   Weight: 110.5 kg (243 lb 9.6 oz) 108 kg (238 lb) 106.4 kg (234 lb 9.6 oz)      No results for input(s): PHART, PO2ART in the last 72 hours.    Invalid input(s): PC02A

## 2017-03-26 NOTE — Progress Notes
RT Adult Assessment Note    NAME:Diane Savage             MRN: [id number]             DOB:1955/10/18          AGE: 61 y.o.  ADMISSION DATE: 03/13/2017             DAYS ADMITTED: LOS: 13 days    RT Treatment Plan:       Protocol Plan: Procedures  PEP Therapy: Place a nursing order for "IS Q1h While Awake" for any of Lung Expansion indicators  PAP: Place a nursing order for "IS Q1h While Awake" for any of Lung Expansion indicators  IPPB: Place a nursing order for "IS Q1h While Awake" for any of Lung Expansion indicators  Oxygen/Humidity: O2 to keep SpO2 > 92%  Monitoring: Pulse oximetry BID & PRN          Vital Signs:  Pulse: Pulse: 88  RR: Respirations: 18 PER MINUTE  SpO2: SpO2: 94 %  O2 Device:    Liter Flow:    O2%: O2 Percent: 21 %  Breath Sounds: All Breath Sounds: Clear (implies normal);Decreased  Respiratory Effort:

## 2017-03-26 NOTE — Progress Notes
Cardiology Progress Note      Today's Date:  03/26/2017  Name:  Diane Savage                                                           MRN:  [id number]   Admission Date:  03/13/2017  LOS: 13 days    Assessment/Plan:  arrhythmia   Principal Problem:    Atrial fibrillation with rapid ventricular response (HCC)  Active Problems:    Type 2 diabetes mellitus with circulatory disorder, without long-term current use of insulin (HCC)    PAF (paroxysmal atrial fibrillation) (HCC)    Chronic gastrointestinal bleeding    Transfusion-dependent anemia    CAD (coronary artery disease), native coronary artery    HLD (hyperlipidemia)    Hypothyroidism    AKI (acute kidney injury) (HCC)    Essential hypertension    Pleural effusion on left    Left leg cellulitis    Barrett's esophagus    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  ???  1. 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 >???Continue ASA and Metoprolol Tartrate 100mg  BID  ???????????????????????????????????????>???Diltiazem 180 mg daily  ???????????????????????????????????????>???off heparin drip as bridge to warfarin - both being held w/ new onset BRBPR as of 10/22  ???????????????????????????????????????>???warfarin 2.5 mg, monitor INR daily going forward (1.6 today) - will restart after colonoscopy   ???  ???  2. 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 >???Holding lasix at this time with prep and NPO, will dose daily according to volume status                > Will need daily diuresis plan going forward                > Daily Weight, accurate I/O needed  ???  3. Hematochezia   -  Patient had occurrence of bright red blood coated bowel movement  - Known history of GAVE requiring motnhly transfusion in the past  - Hemoglobin stable through event: 8.0 --> 8.2 --> 8.3  - GI Consulted: Ddx includes Hemorrhoidal bleeding, diverticular bleeding, colonic AVM, ischemic colitis, bleeding colonic???polyp, small bowel etiology such as small bowel AVM or polyp  Plan:  - Colonoscopy today - will follow results and GI recs  - Heparin Drip paused, Warfarin held  - Bowel Prepped w/ Magnesium Citrate and Docusate  ???  4. ???Acute kidney injury, improving  -FE urea 8.4%, consistent with prerenal etiology on admission  -Improving with diuresis, consistent with cardiorenal syndrome  -Renal ultrasound unremarkable  -Urine eosinophils negative  Plan:  ???????????????????????????????????????>???Avoid nephrotoxic medications ???????????????????????????????????????>???Nephrology consulted - recommend continued diuresis  ???????????????????????????????????????>???Continue Diuresis w/ Iv Bolus Lasix  ???  ???  ???  5. 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  6. 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 >???monitor w/ daily CBC going forward  ???  7. Hypothyroidism  > Continue Synthroid  ???  8. HLD  > Continue PTA statin  ???  9. HTN  > Continue metoprolol  ???  10. DMII tomorrow  - PTA regimen: NPH 18u QD, Novolog 4u TID w/ meals  > Continue PTA NPH, MDCF  ???  Fluids, electrolytes and Nutrition:  IVF:???no IVF  Electrolytes:???Monitor and replace PRN.  Diet:???NPO for Colonscopy today   Ppx: DVT:???SCD Stress ulcer:???PPI  ???  Code status: Full Code  Disposition:???contiue on CV1, continue diuresis going forward. Plan for Colonoscopy in AM  ???  Patient has been seen and discussed with Dr. Pierre Bali  __________________________________________________________________________________    Subjective:   Diane Savage is a 61 y.o. female.  Overnight she completed only partial prep.  Has had recurrent bloody bowel movements.  She reports being anxious this morning.  She denies any shortness of breath, lightheadedness, dizziness endorses some epigastric pain.  Still amenable to having colonoscopy done.    Objective:  Medications:  Scheduled Meds: aspirin chewable tablet 81 mg 81 mg Oral QDAY   cetirizine (ZYRTEC) tablet 10 mg 10 mg Oral QAM8   diltiazem CD (cardIZEM CD) capsule 180 mg 180 mg Oral QDAY   folic acid (FOLVITE) tablet 1 mg 1 mg Oral QDAY   insulin aspart U-100 (NOVOLOG FLEXPEN) injection PEN 0-14 Units 0-14 Units Subcutaneous ACHS   insulin NPH (HUMULIN N KwikPen) injection PEN 18 Units 18 Units Subcutaneous QDAY(07)   levothyroxine (SYNTHROID) tablet 175 mcg 175 mcg Oral QDAY 30 min before breakfast   magnesium citrate oral solution 296 mL 296 mL Oral ONCE   magnesium oxide (MAG-OX) tablet 400 mg 400 mg Oral BID   melatonin tablet 5 mg 5 mg Oral QHS  metoprolol tartrate (LOPRESSOR) tablet 100 mg 100 mg Oral BID   nystatin (NYSTOP) topical powder  Topical BID   pantoprazole DR (PROTONIX) tablet 80 mg 80 mg Oral QDAY(21)   rosuvastatin (CRESTOR) tablet 40 mg 40 mg Oral QDAY   senna/docusate (SENOKOT-S) tablet 2 tablet 2 tablet Oral BID   sucralfate (CARAFATE) tablet 1 g 1 g Oral QID   Continuous Infusions:  PRN and Respiratory Meds:acetaminophen Q6H PRN, hydrOXYzine TID PRN, nitroglycerin Q5 MIN PRN, ondansetron Q8H PRN, traMADol Q6H PRN                          Vital Signs:  Last Filed                Vital Signs: 24 Hour Range   BP: 115/43 (10/24 0415)  Temp: 36.6 ???C (97.8 ???F) (10/24 0415)  Pulse: 93 (10/24 0415)  Respirations: 16 PER MINUTE (10/24 0415)  SpO2: 90 % (10/24 0415)  O2 Delivery: None (Room Air) (10/24 0415)  BP: (115-134)/(43-60)   Temp:  [36.3 ???C (97.4 ???F)-36.6 ???C (97.9 ???F)]   Pulse:  [77-93]   Respirations:  [14 PER MINUTE-18 PER MINUTE]   SpO2:  [90 %-96 %]   O2 Delivery: None (Room Air)    Intensity Pain Scale (Self Report): 6  Verbal Pain Description: Mild Pain Vitals:    03/23/17 0400 03/24/17 0305 03/25/17 0510   Weight: 110.5 kg (243 lb 9.6 oz) 108 kg (238 lb) 106.4 kg (234 lb 9.6 oz)       Intake/Output Summary: (Last 24 hours)    Intake/Output Summary (Last 24 hours) at 03/26/17 0649  Last data filed at 03/26/17 0415 Gross per 24 hour   Intake              740 ml   Output             1200 ml   Net             -460 ml         Physical Exam:    General: alert and oriented  Eyes: ???Conjunctivae/corneas clear. ???EOMI bilaterally, no conjunctival injection  Neck: JVD at 6cm present  Lungs: Mild crackles in lower lung fields, otherwise clear throughout   Heart: ??????Regular rhythm, tachycardic, S1, S2 normal, no appreciable murmur  Abdomen: ???Soft, non-tender. ???Bowel sounds normal. ???No masses. ???No organomegaly.  Extremities: Bilateral lower extremities. ???2+ pitting edema bilaterally, mildly worse on the left side, there is a scar assumably where the vein graft was harvested, erythema improved, without any purulence noted  Skin: Scar on left leg from saphenous vein graft without purulence, there is erythema, warmth is equal bilaterally  Neurologic: ??????CNII - XII intact. ???Normal strength, sensation and reflexes throughout.  Psych: normal mood and affect    Laboratory Review:   24-hour labs:    Results for orders placed or performed during the hospital encounter of 03/13/17 (from the past 24 hour(s))   POC GLUCOSE    Collection Time: 03/25/17  6:04 PM   Result Value Ref Range    Glucose, POC 273 (H) 70 - 100 MG/DL   POC GLUCOSE    Collection Time: 03/25/17  9:06 PM   Result Value Ref Range    Glucose, POC 165 (H) 70 - 100 MG/DL   PROTIME INR (PT)    Collection Time: 03/26/17  4:25 AM   Result Value Ref Range    INR 1.6 (H) 0.8 -  1.2   CBC    Collection Time: 03/26/17  4:25 AM   Result Value Ref Range    White Blood Cells 4.4 (L) 4.5 - 11.0 K/UL    RBC 2.86 (L) 4.0 - 5.0 M/UL    Hemoglobin 9.1 (L) 12.0 - 15.0 GM/DL    Hematocrit 45.4 (L) 36 - 45 %    MCV 96.0 80 - 100 FL    MCH 32.0 26 - 34 PG    MCHC 33.3 32.0 - 36.0 G/DL    RDW 09.8 (H) 11 - 15 %    Platelet Count 130 (L) 150 - 400 K/UL    MPV 8.6 7 - 11 FL   BASIC METABOLIC PANEL    Collection Time: 03/26/17  4:25 AM   Result Value Ref Range    Sodium 137 137 - 147 MMOL/L Potassium 4.3 3.5 - 5.1 MMOL/L    Chloride 93 (L) 98 - 110 MMOL/L    CO2 37 (H) 21 - 30 MMOL/L    Anion Gap 7 3 - 12    Glucose 129 (H) 70 - 100 MG/DL    Blood Urea Nitrogen 34 (H) 7 - 25 MG/DL    Creatinine 1.19 (H) 0.4 - 1.00 MG/DL    Calcium 14.7 8.5 - 82.9 MG/DL    eGFR Non African American 41 (L) >60 mL/min    eGFR African American 50 (L) >60 mL/min   PTT (APTT)    Collection Time: 03/26/17  4:25 AM   Result Value Ref Range    APTT 29.1 20.0 - 36.0 SEC   MAGNESIUM    Collection Time: 03/26/17  4:25 AM   Result Value Ref Range    Magnesium 2.3 1.6 - 2.6 mg/dL   POC GLUCOSE    Collection Time: 03/26/17  7:56 AM   Result Value Ref Range    Glucose, POC 133 (H) 70 - 100 MG/DL       Point of Care Testing:  (Last 24 hours):  Glucose: (!) 129 (03/26/17 0425)  POC Glucose (Download): (!) 165 (03/25/17 2106)    Cardiographics:   No new    Other Radiology/Diagnostics Review:     Pertinent radiology reviewed.    Ronney Lion, MD  Pager [phone]

## 2017-03-27 ENCOUNTER — Encounter: Admit: 2017-03-27 | Discharge: 2017-03-27 | Payer: MEDICARE

## 2017-03-27 DIAGNOSIS — M549 Dorsalgia, unspecified: ICD-10-CM

## 2017-03-27 DIAGNOSIS — H547 Unspecified visual loss: ICD-10-CM

## 2017-03-27 DIAGNOSIS — M1990 Unspecified osteoarthritis, unspecified site: ICD-10-CM

## 2017-03-27 DIAGNOSIS — D699 Hemorrhagic condition, unspecified: ICD-10-CM

## 2017-03-27 DIAGNOSIS — E119 Type 2 diabetes mellitus without complications: ICD-10-CM

## 2017-03-27 DIAGNOSIS — I4891 Unspecified atrial fibrillation: Secondary | ICD-10-CM

## 2017-03-27 DIAGNOSIS — I5032 Chronic diastolic (congestive) heart failure: ICD-10-CM

## 2017-03-27 DIAGNOSIS — I2510 Atherosclerotic heart disease of native coronary artery without angina pectoris: ICD-10-CM

## 2017-03-27 DIAGNOSIS — I10 Essential (primary) hypertension: Secondary | ICD-10-CM

## 2017-03-27 DIAGNOSIS — K319 Disease of stomach and duodenum, unspecified: ICD-10-CM

## 2017-03-27 DIAGNOSIS — E039 Hypothyroidism, unspecified: ICD-10-CM

## 2017-03-27 LAB — POC GLUCOSE
Lab: 157 mg/dL — ABNORMAL HIGH (ref 70–100)
Lab: 216 mg/dL — ABNORMAL HIGH (ref 70–100)
Lab: 147 mg/dL — ABNORMAL HIGH (ref 70–100)
Lab: 136 mg/dL — ABNORMAL HIGH (ref 70–100)
Lab: 91 mg/dL (ref 70–100)

## 2017-03-27 LAB — CBC: Lab: 4.4 K/UL — ABNORMAL LOW (ref >60)

## 2017-03-27 LAB — PROTIME INR (PT): Lab: 1.6 g/dL — ABNORMAL HIGH (ref 0.8–1.2)

## 2017-03-27 LAB — PTT (APTT): Lab: 30 s — ABNORMAL LOW (ref >60)

## 2017-03-27 LAB — BASIC METABOLIC PANEL: Lab: 136 MMOL/L — ABNORMAL LOW (ref 137–147)

## 2017-03-27 LAB — MAGNESIUM: Lab: 2.3 mg/dL — ABNORMAL HIGH (ref 1.6–2.6)

## 2017-03-27 MED ORDER — ROSUVASTATIN 40 MG PO TAB
40 mg | ORAL_TABLET | Freq: Every day | ORAL | 3 refills | 90.00000 days | Status: AC
Start: 2017-03-27 — End: 2017-03-28

## 2017-03-27 MED ORDER — PHENYLEPHRINE HCL 10 MG/ML IJ SOLN
0 refills | Status: DC
Start: 2017-03-27 — End: 2017-03-27
  Administered 2017-03-27: 21:00:00 50 ug via INTRAVENOUS

## 2017-03-27 MED ORDER — SODIUM CHLORIDE 0.9 % IV SOLP (OR) 500ML
0 refills | Status: DC
Start: 2017-03-27 — End: 2017-03-27
  Administered 2017-03-27: 20:00:00 via INTRAVENOUS

## 2017-03-27 MED ORDER — POTASSIUM CHLORIDE 20 MEQ PO TBTQ
20 meq | ORAL_TABLET | Freq: Every day | ORAL | 3 refills | 30.00000 days | Status: AC
Start: 2017-03-27 — End: 2017-06-19

## 2017-03-27 MED ORDER — TORSEMIDE 20 MG PO TAB
40 mg | Freq: Every day | ORAL | 0 refills | Status: DC
Start: 2017-03-27 — End: 2017-03-28
  Administered 2017-03-27: 15:00:00 40 mg via ORAL

## 2017-03-27 MED ORDER — PRAMOXINE-ZINC OXIDE 1-5 % TP CREA
Freq: Every day | RECTAL | 0 refills | 7.00000 days | Status: AC | PRN
Start: 2017-03-27 — End: 2017-03-28

## 2017-03-27 MED ORDER — SODIUM CHLORIDE 0.9 % IV SOLP
500 mL | Freq: Once | INTRAVENOUS | 0 refills | Status: DC
Start: 2017-03-27 — End: 2017-03-28

## 2017-03-27 MED ORDER — MAGNESIUM OXIDE 400 MG (241.3 MG MAGNESIUM) PO TAB
400 mg | ORAL_TABLET | Freq: Two times a day (BID) | ORAL | 3 refills | Status: SS
Start: 2017-03-27 — End: 2017-05-28

## 2017-03-27 MED ORDER — POTASSIUM CHLORIDE 20 MEQ PO TBTQ
20 meq | ORAL_TABLET | Freq: Every day | ORAL | 3 refills | 30.00000 days | Status: AC
Start: 2017-03-27 — End: 2017-03-28

## 2017-03-27 MED ORDER — LIDOCAINE (PF) 200 MG/10 ML (2 %) IJ SYRG
0 refills | Status: DC
Start: 2017-03-27 — End: 2017-03-27
  Administered 2017-03-27: 21:00:00 80 mg via INTRAVENOUS

## 2017-03-27 MED ORDER — PRAMOXINE-ZINC OXIDE 1-5 % TP CREA
Freq: Every day | RECTAL | 0 refills | 7.00000 days | Status: AC | PRN
Start: 2017-03-27 — End: 2018-02-24

## 2017-03-27 MED ORDER — MAGNESIUM OXIDE 400 MG (241.3 MG MAGNESIUM) PO TAB
400 mg | ORAL_TABLET | Freq: Two times a day (BID) | ORAL | 3 refills | Status: AC
Start: 2017-03-27 — End: 2017-03-28

## 2017-03-27 MED ORDER — TORSEMIDE 20 MG PO TAB
40 mg | ORAL_TABLET | Freq: Every day | ORAL | 3 refills | Status: SS
Start: 2017-03-27 — End: 2017-05-28

## 2017-03-27 MED ORDER — PROPOFOL INJ 10 MG/ML IV VIAL
0 refills | Status: DC
Start: 2017-03-27 — End: 2017-03-27
  Administered 2017-03-27: 21:00:00 30 mg via INTRAVENOUS
  Administered 2017-03-27: 21:00:00 20 mg via INTRAVENOUS
  Administered 2017-03-27 (×2): 10 mg via INTRAVENOUS

## 2017-03-27 MED ORDER — KETAMINE 10 MG/ML IJ SOLN
0 refills | Status: DC
Start: 2017-03-27 — End: 2017-03-27
  Administered 2017-03-27: 21:00:00 30 mg via INTRAVENOUS

## 2017-03-27 MED ORDER — DILTIAZEM HCL 180 MG PO CP24
180 mg | ORAL_CAPSULE | Freq: Every day | ORAL | 3 refills | 45.00000 days | Status: AC
Start: 2017-03-27 — End: 2017-06-06

## 2017-03-27 MED ORDER — PROPOFOL 10 MG/ML IV EMUL 20 ML (INFUSION)(AM)(OR)
INTRAVENOUS | 0 refills | Status: DC
Start: 2017-03-27 — End: 2017-03-27
  Administered 2017-03-27: 21:00:00 10 ug/kg/min via INTRAVENOUS

## 2017-03-27 MED ORDER — ROSUVASTATIN 40 MG PO TAB
40 mg | ORAL_TABLET | Freq: Every day | ORAL | 3 refills | 90.00000 days | Status: AC
Start: 2017-03-27 — End: 2017-04-16

## 2017-03-27 MED ORDER — DILTIAZEM HCL 180 MG PO CP24
180 mg | ORAL_CAPSULE | Freq: Every day | ORAL | 3 refills | 90.00000 days | Status: AC
Start: 2017-03-27 — End: 2017-03-28

## 2017-03-27 MED ORDER — TORSEMIDE 20 MG PO TAB
40 mg | ORAL_TABLET | Freq: Every day | ORAL | 3 refills | 67.50000 days | Status: AC
Start: 2017-03-27 — End: 2017-03-28

## 2017-03-27 NOTE — Anesthesia Post-Procedure Evaluation
Post-Anesthesia Evaluation    Name: Diane Savage      MRN: [id number]     DOB: 1956/02/12     Age: 61 y.o.     Sex: female   __________________________________________________________________________     Procedure Date: 03/27/2017  Procedure: * No procedures listed *      Surgeon: * No surgeons listed *    Post-Anesthesia Vitals  BP: 102/54 (10/25 1610)  Pulse: 75 (10/25 1610)  Respirations: 26 PER MINUTE (10/25 1610)  SpO2: 90 % (10/25 1610)  O2 Delivery: Nasal Cannula (10/25 1610)  Height: 162.6 cm (64") (10/25 1606)      Post Anesthesia Evaluation Note    Evaluation location: Pre/Post  Patient participation: recovered; patient participated in evaluation  Level of consciousness: alert    Pain score: 0  Pain management: adequate    Hydration: normovolemia  Temperature: 36.0C - 38.4C  Airway patency: adequate    Perioperative Events  Perioperative events:  no       Post-op nausea and vomiting: no PONV    Postoperative Status  Cardiovascular status: hemodynamically stable  Respiratory status: spontaneous ventilation  Follow-up needed: none        Perioperative Events  Perioperative Event: No  Emergency Case Activation: No

## 2017-03-27 NOTE — Progress Notes
PHYSICAL THERAPY  PROGRESS NOTE       MOBILITY:  Progressive Mobility Level: Walk in room  Distance Walked (feet): 50 ft  Level of Assistance: Stand by assistance  Assistive Device: None  Time Tolerated: 11-30 minutes  Activity Limited By: Weakness;Fatigue    SUBJECTIVE:  Significant hospital events: s/p CABG/MAZE/ ligation LAA on Dr. Farris Has on 02/04/17 admitted 03/13/17 with atrial fibrillation and left pleural effusion. Chest tube placed 10/12. rapid responded 10/18 and tranaferred to cicu. s/p colonoscopy 10/24, TEE planned 10/25 PM   Mental / Cognitive Status: Alert;Cooperative;Follows Commands  Persons Present: Spouse  Pain: Patient has no complaint of pain  Pain Location: Back  Pain Description: Aching  Pain Interventions: Patient agrees to participate in therapy  Ambulation Assist: Independent Mobility at Household Level without Device  Patient Owned Equipment: None  Home Situation: Lives with Family  Type of Home: House  Entry Stairs: Ramp  In-Home Stairs: Able to Live on One Level    BED MOBILITY/TRANSFERS:  Bed Mobility: Sit to Supine: Standby Assist;Bed Flat  Comments: Patient recieved in bedside chair  Transfer Type: Sit to/from Stand  Transfer: Assistance Level: To/From;Bed Side Chair;Standby Assist  Transfer: Assistive Device: None  Transfers: Type Of Assistance: For Safety Considerations  Other Transfer Type: Sit to/from Stand  Other Transfer: Assistance Level: To/From;Bed;Standby Assist  Other Transfer: Assistive Device: None  Other Transfer: Type Of Assistance: For Safety Considerations  Repeated sit to stands from edge of bed performed. Patient completed with supervision  End Of Activity Status: In Bed;Nursing Notified;Instructed Patient to Request Assist with Mobility;Instructed Patient to Use Call Light    GAIT:  Gait Distance: 50 feet  Gait: Assistance Level: Standby Assist;Safety Considerations  Gait: Assistive Device: None  Gait: Descriptors: Pace: Slow;Normal step length Comments: Patient reports concerns for knees buckling in the past, however states she has been able to get around her room without difficulty the past few days. Patient noted to walk with minimal knee flexion throughout cycle bilaterally.   Activity Limited By: Complaint of Fatigue;Weakness    EDUCATION:  Persons Educated: Patient/Family  Patient Barriers To Learning: None Noted  Interventions: Repetition of Instructions;Family Education  Teaching Methods: Verbal Instruction  Patient Response: Verbalized Understanding;More Instruction Required  Topics: Plan/Goals of PT Interventions;Mobility Progression;Safety Awareness;Up with Assist Only;Importance of Increasing Activity;Ambulate With Nursing;Recommend Continued Therapy    ASSESSMENT/PROGRESS:  Impaired Mobility Due To: Decreased Activity Tolerance;Safety Concerns;Impaired Balance;Decreased Strength  Assessment/Progress: Should Improve w/ Continued PT  AM-PAC 6 Clicks Basic Mobility Inpatient  Turning from your back to your side while in a flat bed without using bed rails: A Little  Moving from lying on your back to sitting on the side of a flatbed without using bedrails : A Little  Moving to and from a bed to a chair (including a wheelchair): A Little  Standing up from a chair using your arms (e.g. wheelchair, or bedside chair): A Little  To walk in hospital room: A Little  Climbing 3-5 steps with a railing: A Lot  Raw Score: 17  Standardized (T-scale) Score: 39.67  Basic Mobility CMS 0-100%: 43.83  CMS G Code Modifier for Basic Mobility: CK    GOALS:  Goal Formulation: With Patient  Time For Goal Achievement: 5 days  Pt Will Go Supine To/From Sit: Independently  Pt Will Transfer Bed/Chair: Independently  Pt Will Transfer Sit to Stand: Independently  Pt Will Ambulate: 101-150 Feet, w/ No Device, w/ Stand By Assist    PLAN:  Treatment Interventions:  Mobility Training;Endurance Training  Plan Frequency: 5 Days per Week PT Plan for Next Visit: Progress balance training, progress ambulation tolerance.     RECOMMENDATIONS:  PT Discharge Recommendations: Home with Assistance;and;Home Health Setting;versus;Outpatient Therapy Setting  Equipment Recommendations: None (anticipate none)  Recommend ongoing assistance for: In and out of house;Safety concerns    Therapist: Gerome Sam, PT  Date: 03/27/2017

## 2017-03-27 NOTE — Progress Notes
OCCUPATIONAL THERAPY  NO TREATMENT NOTE     The patient was not seen due to: Patient not available    Pt chart reviewed. Upon approaching pt's room pt is being transported off unit. Will f/u as able/appropriate.     Attempted to see patient 1 time.    Therapist: Salli Real, OTR/L [id number]  Date: 03/27/2017

## 2017-03-27 NOTE — Progress Notes
Cardiology Progress Note      Today's Date:  03/27/2017  Name:  Diane Savage                                                           MRN:  [id number]   Admission Date:  03/13/2017  LOS: 14 days    Assessment/Plan:  arrhythmia   Principal Problem:    Atrial fibrillation with rapid ventricular response (HCC)  Active Problems:    Type 2 diabetes mellitus with circulatory disorder, without long-term current use of insulin (HCC)    PAF (paroxysmal atrial fibrillation) (HCC)    Chronic gastrointestinal bleeding    Transfusion-dependent anemia    CAD (coronary artery disease), native coronary artery    HLD (hyperlipidemia)    Hypothyroidism    AKI (acute kidney injury) (HCC)    Essential hypertension    Pleural effusion on left    Left leg cellulitis    Barrett's esophagus    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  ???  1. 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 >???Continue ASA and Metoprolol Tartrate 100mg  BID  ???????????????????????????????????????>???Diltiazem 180 mg daily  ???????????????????????????????????????>???off anticoagulation at this time - pending TEE evaluation   ??????  2. 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 >???40mg  PO Torsemide today                 > Will plan for Torsemide 40mg  PO QD at home                 > Daily Weight, accurate I/O needed  ???  3. 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  Plan:  - Gi recommends repeat colonoscopy at future appointment  - TEE today for evaluation of atrial appendage; will aid in discussion for future anticoagulation  - Hemorrhoid Cream ordered  ??? 4. ???Acute kidney injury, improving  -FE urea 8.4%, consistent with prerenal etiology on admission  -Improving with diuresis, consistent with cardiorenal syndrome  -Renal ultrasound unremarkable  -Urine eosinophils negative  Plan:  ???????????????????????????????????????>???Avoid nephrotoxic medications  ???????????????????????????????????????>???Nephrology consulted - recommend continued diuresis  ???????????????????????????????????????>???Continue Diuresis w/ Iv Bolus Lasix  ???  ???  ???  5. 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  6. 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 >???monitor w/ daily CBC going forward  ???  7. Hypothyroidism  > Continue Synthroid  ???  8. HLD  > Continue PTA statin  ???  9. HTN  > Continue metoprolol  ???  10. DMII tomorrow  - PTA regimen: NPH 18u QD, Novolog 4u TID w/ meals  > Continue PTA NPH, MDCF  ???  Fluids, electrolytes and Nutrition:  IVF:???no IVF  Electrolytes:???Monitor and replace PRN.  Diet:???NPO for TEE today  Ppx: DVT:???SCD Stress ulcer:???PPI  ???  Code status: Full Code  Disposition:???TEE in the afternoon today; discharge pending results of TEE and need for further intervention  ???  Patient has been seen and discussed with Dr. Pierre Bali  __________________________________________________________________________________    Subjective: Diane Savage is a 61 y.o. female. She slept well overnight and is anxious to continue treatment so she can return home. We discussed the need for future colonscopy and she understands. She reports only minimal bloo din her stools last night and this morning, and mentions the hemorrhoid cream has helped. The team explained the need for TEE to evaluate her atria and see if there is a need for further anticoagulation, and then she should be able to go home, pending those results. She understands.    ROS: + hematochezia + LE swelling, negative chest pain, SOB, abdominal pain, diarrhea, constipation, nausea vomiting  Objective:  Medications:  Scheduled Meds:    aspirin chewable tablet 81 mg 81 mg Oral QDAY   cetirizine (ZYRTEC) tablet 10 mg 10 mg Oral QAM8   diltiazem CD (cardIZEM CD) capsule 180  mg 180 mg Oral QDAY   folic acid (FOLVITE) tablet 1 mg 1 mg Oral QDAY   insulin aspart U-100 (NOVOLOG FLEXPEN) injection PEN 0-14 Units 0-14 Units Subcutaneous ACHS   insulin NPH (HUMULIN N KwikPen) injection PEN 18 Units 18 Units Subcutaneous QDAY(07)   levothyroxine (SYNTHROID) tablet 175 mcg 175 mcg Oral QDAY 30 min before breakfast   magnesium oxide (MAG-OX) tablet 400 mg 400 mg Oral BID   melatonin tablet 5 mg 5 mg Oral QHS   metoprolol tartrate (LOPRESSOR) tablet 100 mg 100 mg Oral BID   nystatin (NYSTOP) topical powder  Topical BID   pantoprazole DR (PROTONIX) tablet 80 mg 80 mg Oral QDAY(21)   rosuvastatin (CRESTOR) tablet 40 mg 40 mg Oral QDAY   senna/docusate (SENOKOT-S) tablet 2 tablet 2 tablet Oral BID   torsemide(+) (DEMADEX) tablet 40 mg 40 mg Oral QDAY   Continuous Infusions:  PRN and Respiratory Meds:acetaminophen Q6H PRN, hydrOXYzine TID PRN, nitroglycerin Q5 MIN PRN, ondansetron Q8H PRN, pramoxine/zinc oxide QDAY PRN, traMADol Q6H PRN                          Vital Signs:  Last Filed                Vital Signs: 24 Hour Range   BP: 120/49 (10/25 0838)  Temp: 36.4 ???C (97.5 ???F) (10/25 1610) Pulse: 95 (10/25 0838)  Respirations: 18 PER MINUTE (10/25 0838)  SpO2: 93 % (10/25 0838)  O2 Delivery: None (Room Air) (10/25 0838)  SpO2 Pulse: 86 (10/24 1325)  BP: (116-130)/(45-78)   Temp:  [36.3 ???C (97.4 ???F)-36.7 ???C (98.1 ???F)]   Pulse:  [81-95]   Respirations:  [14 PER MINUTE-23 PER MINUTE]   SpO2:  [84 %-97 %]   O2 Delivery: None (Room Air)    Intensity Pain Scale (Self Report): 7  Verbal Pain Description: Mild Pain Vitals:    03/23/17 0400 03/24/17 0305 03/25/17 0510   Weight: 110.5 kg (243 lb 9.6 oz) 108 kg (238 lb) 106.4 kg (234 lb 9.6 oz)       Intake/Output Summary: (Last 24 hours)    Intake/Output Summary (Last 24 hours) at 03/27/17 0859  Last data filed at 03/27/17 9604   Gross per 24 hour   Intake             1100 ml   Output              250 ml   Net              850 ml         Physical Exam:    General: alert and oriented  Eyes: ???Conjunctivae/corneas clear. ???EOMI bilaterally, no conjunctival injection  Neck: JVD at 7cmH20 present  Lungs: Mild crackles in lower lung fields, otherwise clear throughout   Heart: ??????Regular rhythm, tachycardic, S1, S2 normal, no appreciable murmur  Abdomen: ???Soft, non-tender. ???Bowel sounds normal. ???No masses. ???No organomegaly.  Extremities: Bilateral lower extremities. ???2+ pitting edema bilaterally, mildly worse on the left side, there is a scar assumably where the vein graft was harvested, erythema improved, without any purulence noted  Skin: Scar on left leg from saphenous vein graft without purulence, there is erythema, warmth is equal bilaterally  Neurologic: ??????CNII - XII intact. ???Normal strength, sensation and reflexes throughout.  Psych: normal mood and affect    Laboratory Review:   24-hour labs:    Results for orders placed or performed  during the hospital encounter of 03/13/17 (from the past 24 hour(s))   POC GLUCOSE    Collection Time: 03/26/17  2:29 PM   Result Value Ref Range    Glucose, POC 141 (H) 70 - 100 MG/DL   POC GLUCOSE Collection Time: 03/26/17  5:59 PM   Result Value Ref Range    Glucose, POC 261 (H) 70 - 100 MG/DL   POC GLUCOSE    Collection Time: 03/26/17  9:00 PM   Result Value Ref Range    Glucose, POC 157 (H) 70 - 100 MG/DL   PROTIME INR (PT)    Collection Time: 03/27/17  5:00 AM   Result Value Ref Range    INR 1.6 (H) 0.8 - 1.2   CBC    Collection Time: 03/27/17  5:00 AM   Result Value Ref Range    White Blood Cells 4.4 (L) 4.5 - 11.0 K/UL    RBC 2.59 (L) 4.0 - 5.0 M/UL    Hemoglobin 8.1 (L) 12.0 - 15.0 GM/DL    Hematocrit 14.7 (L) 36 - 45 %    MCV 96.6 80 - 100 FL    MCH 31.4 26 - 34 PG    MCHC 32.5 32.0 - 36.0 G/DL    RDW 82.9 (H) 11 - 15 %    Platelet Count 129 (L) 150 - 400 K/UL    MPV 8.8 7 - 11 FL   BASIC METABOLIC PANEL    Collection Time: 03/27/17  5:00 AM   Result Value Ref Range    Sodium 136 (L) 137 - 147 MMOL/L    Potassium 4.2 3.5 - 5.1 MMOL/L    Chloride 93 (L) 98 - 110 MMOL/L    CO2 36 (H) 21 - 30 MMOL/L    Anion Gap 7 3 - 12    Glucose 135 (H) 70 - 100 MG/DL    Blood Urea Nitrogen 32 (H) 7 - 25 MG/DL    Creatinine 5.62 (H) 0.4 - 1.00 MG/DL    Calcium 9.4 8.5 - 13.0 MG/DL    eGFR Non African American 37 (L) >60 mL/min    eGFR African American 45 (L) >60 mL/min   PTT (APTT)    Collection Time: 03/27/17  5:00 AM   Result Value Ref Range    APTT 30.2 20.0 - 36.0 SEC   MAGNESIUM    Collection Time: 03/27/17  5:00 AM   Result Value Ref Range    Magnesium 2.3 1.6 - 2.6 mg/dL       Point of Care Testing:  (Last 24 hours):  Glucose: (!) 135 (03/27/17 0500)  POC Glucose (Download): (!) 157 (03/26/17 2100)    Cardiographics:   No new    Other Radiology/Diagnostics Review:     Pertinent radiology reviewed.    Rosaria Ferries, MD  Pager [phone]

## 2017-03-27 NOTE — Progress Notes
Gastroenterology Progress Note  Patient Name:Diane Savage         ZOX:[id number]  Admission Date: 03/13/2017  2:56 PM      Principal Problem:    Atrial fibrillation with rapid ventricular response (HCC)  Active Problems:    Type 2 diabetes mellitus with circulatory disorder, without long-term current use of insulin (HCC)    PAF (paroxysmal atrial fibrillation) (HCC)    Chronic gastrointestinal bleeding    Transfusion-dependent anemia    CAD (coronary artery disease), native coronary artery    HLD (hyperlipidemia)    Hypothyroidism    AKI (acute kidney injury) (HCC)    Essential hypertension    Pleural effusion on left    Left leg cellulitis    Barrett's esophagus    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 PMH significant for CAD, STEMI, prior CABG 9/28 with subsequent development of atrial fibrillation/flutter not currently on anticoagulation, DM, hypertension.  She had previously consulted for evaluation of anticoagulation versus ischemic risk in the setting of a presumed chronic GI bleed.  EGD performed 10/15 notable for findings of small amount of GAVE, portal hypertensive gastropathy, grade 1 esophageal varices.  No APC performed due to lack of benefit in the setting of small amount of GAVE.  Patient now presents with one episode of hematochezia, described as bright red blood per rectum while on heparin to warfarin bridge for atrial fibrillation prophylactic anticoagulation.    #Hematochezia  #GAVE  #Esophageal varices  #Need for recurrent blood transfusions  #Atrial fibrillation/flutter, not currently anticoagulated  #Recent CABG procedure    Recommendations:  --Patient's hemoglobin is relatively stable  --CS yesterday without significant bleeding findings; erythema in the ascending and cecum, non bleeding diverticulosis, external hemorrhoids. Suspect this might be related to hemorrhoidal bleeding  --At this point, no contraindication from a GI perspective to resume anticoagulation, should the cardiology team feel it necessary. Discussed previously with the patient about the risks of GIB with [facility] and discussed them again today (ischemia vs recurrent GIB) in regards to her high CHADS-VASC risk score  --GI will sign off at this point. If there are any further questions, please do not hesitate to give [place] a call.    Patient discussed with attending on service, Dr. Jake Shark A. Joanne Gavel, DO  Pager [phone]  GI Fellow  03/27/2017 5:45 PM       -----------------------------  Interval Events  No acute events overnight  Patient still with small episodes of hematochezia, mostly mixed with small amount of stool  Abdominal pain is minimal    The patient feels otherwise well, denies any fever/chills, SOB, chest pain, N/V, dysuria.    PMH:  Past Medical History:   Diagnosis Date   ??? Acquired hypothyroidism    ??? Arthritis    ??? Back pain    ??? Bleeding disorder (HCC)    ??? CAD (coronary artery disease), native coronary artery 02/04/2017   ??? Chronic diastolic heart failure (HCC) 03/19/2017   ??? Coronary artery disease    ??? Diabetes mellitus (HCC)     Type II   ??? Essential hypertension 02/11/2017   ??? Hypertension    ??? Stomach disorder    ??? Vision decreased        Current medications:  No current facility-administered medications on file prior  to encounter.      Current Outpatient Prescriptions on File Prior to Encounter   Medication Sig Dispense Refill   ??? acetaminophen (TYLENOL) 325 mg tablet Take two tablets by mouth every 6 hours as needed.  0   ??? aspirin 81 mg chewable tablet Chew one tablet by mouth daily with food. 90 tablet 3   ??? cetirizine (ZYRTEC) 10 mg tablet Take 10 mg by mouth every morning.     ??? folic acid (FOLVITE) 1 mg tablet Take 1 mg by mouth daily.     ??? GLUCOSAMINE HCL/CHONDROITIN SU (GLUCOSAMINE-CHONDROITIN PO) Take 1 tablet by mouth twice daily. ??? insulin aspart U-100 (NOVOLOG) 100 unit/mL injection Inject four Units under the skin three times daily with meals. 10 mL 30   ??? insulin NPH (HUMULIN N NPH U-100 INSULIN) 100 unit/mL injection Inject fourteen Units under the skin every morning. 10 mL 30   ??? Insulin Syringe-Needle U-100 (BD INSULIN SYRINGE ULTRA-FINE) 0.3 mL 31 gauge x 5/16 syrg Use four times daily with Insulin 100 each 0   ??? levothyroxine (SYNTHROID) 175 mcg tablet Take 175 mcg by mouth daily 30 minutes before breakfast.     ??? nitroglycerin (NITROSTAT) 0.4 mg tablet Place 0.4 mg under tongue every 5 minutes as needed for Chest Pain. Max of 3 tablets, call 911.     ??? omeprazole DR(+) (PRILOSEC) 40 mg capsule Take 40 mg by mouth twice daily.     ??? ondansetron (ZOFRAN) 8 mg tablet Take 8 mg by mouth every 8 hours as needed for Nausea or Vomiting.     ??? senna/docusate (SENOKOT-S) 8.6/50 mg tablet Take two tablets by mouth twice daily. (Patient taking differently: Take 2 tablets by mouth at bedtime daily.) 20 tablet 0   ??? traMADol (ULTRAM) 50 mg tablet Take one tablet by mouth every 6 hours as needed for Pain. 30 tablet 0       PSH:  Past Surgical History:   Procedure Laterality Date   ??? HX HEART CATHETERIZATION  2017   ??? CORONARY STENT PLACEMENT  2017   ??? CORONARY ARTERY BYPASS GRAFT N/A 02/04/2017    CORONARY ARTERY BYPASS WITH ARTERIAL GRAFT - 4 GRAFTS (Internal Mammary Artery and Endovascular Vein Harvest) performed by Collene Schlichter, MD at [facility]   ??? HX MAZE N/A 02/04/2017    MAZE PROCEDURE performed by Collene Schlichter, MD at [facility]   ??? UPPER GASTROINTESTINAL ENDOSCOPY N/A 03/17/2017    ESOPHAGOGASTRODUODENOSCOPY performed by Jolee Ewing, MD at [facility]   ??? ANKLE SURGERY      Left ankle reconstruction   ??? COLONOSCOPY     ??? HX KNEE ARTHROSCOPY Left    ??? HX TONSIL AND ADENOIDECTOMY     ??? TUBAL LIGATION     ??? UPPER GASTROINTESTINAL ENDOSCOPY         SH:  Social History     Social History   ??? Marital status: Married     Spouse name: N/A ??? Number of children: N/A   ??? Years of education: N/A     Occupational History   ??? Not on file.     Social History Main Topics   ??? Smoking status: Former Smoker     Packs/day: 2.00     Years: 10.00     Types: Cigarettes     Quit date: 09/16/1980   ??? Smokeless tobacco: Never Used   ??? Alcohol use Yes      Comment: Seldom   ??? Drug use:  No   ??? Sexual activity: Not on file     Other Topics Concern   ??? Not on file     Social History Narrative   ??? No narrative on file       FH:  Family History   Problem Relation Age of Onset   ??? Diabetes Mother    ??? Heart Disease Mother    ??? High Cholesterol Mother    ??? Arthritis-rheumatoid Mother    ??? Stroke Mother    ??? Thyroid Disease Mother    ??? Coronary Artery Disease Mother    ??? Hyperlipidemia Mother    ??? Cancer Father    ??? Cancer-Breast Sister    ??? Cancer Sister    ??? Hypertension Sister    ??? Heart Disease Sister    ??? High Cholesterol Sister    ??? Arthritis-osteo Sister    ??? Migraines Sister    ??? Rashes/Skin Problems Sister    ??? Depression Sister    ??? Hyperlipidemia Sister    ??? Cancer Brother    ??? Hypertension Brother    ??? Arthritis-rheumatoid Brother    ??? Rashes/Skin Problems Brother    ??? Coronary Artery Disease Brother    ??? Heart Disease Maternal Aunt    ??? Coronary Artery Disease Paternal Uncle    ??? Hyperlipidemia Paternal Uncle    ??? Cancer Maternal Grandmother    ??? Diabetes Maternal Grandmother    ??? Heart Disease Maternal Grandmother    ??? Cancer-Colon Paternal Grandmother    ??? Cancer Paternal Grandmother    ??? Diabetes Paternal Grandmother    ??? Cancer Paternal Grandfather        Physical Exam:  Vitals:    03/27/17 1625 03/27/17 1630 03/27/17 1635 03/27/17 1713   BP: 116/51 109/55 114/51 104/53   Pulse: 78 76 76    Temp:    36.4 ???C (97.6 ???F)   SpO2: (!) 91% 94% (!) 91% (!) 91%   Weight:       Height:         General - Alert and oriented, no acute distress.   Head - Normocephalic, atraumatic.   Eyes -  EOMI grossly. No icterus or injection. Oropharynx- No ulcer or bleeding, moist mucosa.  Neck - No swelling or tracheal deviation.   Lung - soft crackles in b/l bases, on 2L NC  Abd -  Soft, minimally TTP generalized, non distended, normal bowel sounds, no hepatospenomegaly.  Extremities - Warm, dry. 2+ pitting edema b/l LEs  Skin - No exposed rash, lesion.  Neurological - No gross deficit    Labs/Imaging:  Pertient labs/imaging was reviewed on initiation of progress note.

## 2017-03-27 NOTE — Anesthesia Pre-Procedure Evaluation
Anesthesia Pre-Procedure Evaluation    Name: Diane Savage      MRN: [id number]     DOB: [birth year]     Age: 61 y.o.     Sex: female   __________________________________________________________________________     Procedure Date:    Procedure: * No surgery found *     Physical Assessment  Vital Signs (last filed in past 24 hours):  BP: 97/56 (10/25 1445)  Temp: 36.4 ???C (97.6 ???F) (10/25 1400)  Pulse: 60 (10/25 1445)  Respirations: 32 PER MINUTE (10/25 1445)  SpO2: 92 % (10/25 1446)  O2 Delivery: Nasal Cannula (10/25 1446)      Patient History  Allergies   Allergen Reactions   ??? Sulfa (Sulfonamide Antibiotics) RASH   ??? Duricef [Cefadroxil] NAUSEA AND VOMITING   ??? Pravastatin NAUSEA AND VOMITING   ??? Simvastatin NAUSEA AND VOMITING        Current Medications    Medication Directions   acetaminophen (TYLENOL) 325 mg tablet Take two tablets by mouth every 6 hours as needed.   aspirin 81 mg chewable tablet Chew one tablet by mouth daily with food.   cetirizine (ZYRTEC) 10 mg tablet Take 10 mg by mouth every morning.   clobetasol (TEMOVATE) 0.05 % topical cream Apply to affected area twice daily as needed   diphenhydrAMINE (BENADRYL ALLERGY) 25 mg tablet Take 25 mg by mouth at bedtime as needed.   folic acid (FOLVITE) 1 mg tablet Take 1 mg by mouth daily.   furosemide (LASIX) 20 mg tablet Take 20 mg by mouth at bedtime daily.   GLUCOSAMINE HCL/CHONDROITIN SU (GLUCOSAMINE-CHONDROITIN PO) Take 1 tablet by mouth twice daily.   insulin aspart U-100 (NOVOLOG) 100 unit/mL injection Inject four Units under the skin three times daily with meals.   insulin NPH (HUMULIN N NPH U-100 INSULIN) 100 unit/mL injection Inject fourteen Units under the skin every morning.   Insulin Syringe-Needle U-100 (BD INSULIN SYRINGE ULTRA-FINE) 0.3 mL 31 gauge x 5/16 syrg Use four times daily with Insulin   levothyroxine (SYNTHROID) 175 mcg tablet Take 175 mcg by mouth daily 30 minutes before breakfast. metoprolol tartrate (LOPRESSOR) 100 mg tablet Take 100 mg by mouth twice daily.   nitroglycerin (NITROSTAT) 0.4 mg tablet Place 0.4 mg under tongue every 5 minutes as needed for Chest Pain. Max of 3 tablets, call 911.   omeprazole DR(+) (PRILOSEC) 40 mg capsule Take 40 mg by mouth twice daily.   ondansetron (ZOFRAN) 8 mg tablet Take 8 mg by mouth every 8 hours as needed for Nausea or Vomiting.   senna/docusate (SENOKOT-S) 8.6/50 mg tablet Take two tablets by mouth twice daily.  Patient taking differently: Take 2 tablets by mouth at bedtime daily.   tiZANidine (ZANAFLEX) 4 mg tablet Take 4 mg by mouth every 6 hours as needed.   traMADol (ULTRAM) 50 mg tablet Take one tablet by mouth every 6 hours as needed for Pain.         Review of Systems/Medical History      Patient summary reviewed  Nursing notes reviewed  Pertinent labs reviewed    PONV Screening: Female gender and Non-smoker  No history of anesthetic complications  No family history of anesthetic complications      Airway - negative        Pulmonary           Sleep apnea (OSA observed with hospitalization, no formal sleep study or diagnosis)      Left pleural effusion- chest tube removed  10/17      Cardiovascular       Recent diagnostic studies:          echocardiogram          03/19/17  ??? Technically limited study.  ??? Normal sized LV with normal systolic function. Estimated LVEF of 65%.   ??? No regional wall abnormalities identified on the echo contrast images.  ??? Right heart not well visualized. RV size appears grossly normal. Cannot assess RV function with this study.  ??? Marked elevated CVP. Estimated PASP of 45 mmHg.  ??? Tricuspid and pulmonary valves not well seen.  ??? No hemodynamically significant valve abnormalities.   ??? No pericardial effusion.      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        Dysrhythmias (intermittent atrial flutter since CABG ); atrial fibrillation        Angina ( chest pain frequently, 3x/week) with exertion      DVT      CHF (Acute on chronic diastolic heart failure due to CAD, Improved after starting diuretic therapy )      Hyperlipidemia      Orthopnea      Syncope ( last fall was a couple months ago)      GI/Hepatic/Renal           Hepatitis B      Liver disease       Renal disease ( Cr 1.28, BUN 44): ARF       Electrolyte problem ( hypokalemia, hyponatremia d/t hypervolemia)      No nausea      No vomiting      Nauseated at present, emesis bag in hand    Barrett's esophagus    History of multiple GI bleeds (telangectasias) requiring regular transfusions. Bloody BM today      Neuro/Psych         Encephalopathy on admission, likely related to hypercapnea. 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  She has not been anticoagulated for her atrial fib due to her bleeding issues.  She also had a persistent left pleural effusion which is been present since her bypass surgery. Transferred to CCU for respiratory distress and AMS    Heparin gtt as bridge therapy, stopped today because of a large bloody BM           Diagnostic Tests  Hematology:   Lab Results   Component Value Date    HGB 8.1 03/27/2017    HCT 25.0 03/27/2017    PLTCT 129 03/27/2017    WBC 4.4 03/27/2017    NEUT 72 03/20/2017    ANC 4.50 03/20/2017    ALC 0.60 03/20/2017    MONA 13 03/20/2017    AMC 0.80 03/20/2017    EOSA 6 03/20/2017    ABC 0.00 03/20/2017    MCV 96.6 03/27/2017    MCH 31.4 03/27/2017    MCHC 32.5 03/27/2017    MPV 8.8 03/27/2017    RDW 17.8 03/27/2017         General Chemistry:   Lab Results   Component Value Date    NA 136 03/27/2017    K 4.2 03/27/2017    CL 93 03/27/2017    CO2 36 03/27/2017    GAP 7 03/27/2017    BUN 32 03/27/2017    CR 1.44 03/27/2017    GLU 135 03/27/2017    CA 9.4 03/27/2017    ALBUMIN 3.5 03/19/2017    LACTIC 0.8 02/05/2017    OBSCA 1.17 02/05/2017    MG 2.3 03/27/2017    TOTBILI 0.7 03/19/2017    PO4 3.3 03/13/2017      Coagulation:   Lab Results   Component Value Date    PTT 30.2 03/27/2017    INR 1.6 03/27/2017     Cath report:   - 80% distal left main,                - moderate LAD disease               -  LCx 80% ostial 70%               -  RCA with 50% lesion and distal 70% disease.                - Aortic pressure 104/52, LVp 109/28, LVEDP , normal LV function    Anesthesia Plan    ASA score: 3   Plan: MAC  Induction method: intravenous  NPO status: acceptable      Informed Consent  Anesthetic plan and risks discussed with patient.  Use of blood products discussed with patient  Blood Consent: consented      Plan discussed with: anesthesiologist and CRNA.

## 2017-03-27 NOTE — Progress Notes
Heart Failure Nursing Progress Note    Admission Date: 03/13/2017  LOS: 14 days    Admission Weight: 113.8 kg (250 lb 12.8 oz)        Most recent weights (inpatient):   Vitals:    03/24/17 0305 03/25/17 0510 03/27/17 1606   Weight: 108 kg (238 lb) 106.4 kg (234 lb 9.6 oz) 106.1 kg (234 lb)     Weight change from previous day:-0.3kg    Fluid restriction ordered: 2042ml    Intake/Output Summary: (Last 24 hours)    Intake/Output Summary (Last 24 hours) at 03/27/17 1644  Last data filed at 03/27/17 1601   Gross per 24 hour   Intake             1000 ml   Output               50 ml   Net              950 ml       Is patient incontinent No    Anticipated discharge date: 10/25  Discharge goals: Get back home.      Daily Assessment of Patient Stated Goals:    Short Term Goal Identified by patient (Short Term=during hospitalization):  Get back home.

## 2017-03-27 NOTE — Progress Notes
Renal Progress Note    Name:  Diane Savage   UEAVW'U Date:  03/27/2017  Admission Date: 03/13/2017  LOS: 14 days          Assessment and Plan   Principal Problem:    Atrial fibrillation with rapid ventricular response (HCC)  Active Problems:    Type 2 diabetes mellitus with circulatory disorder, without long-term current use of insulin (HCC)    PAF (paroxysmal atrial fibrillation) (HCC)    Chronic gastrointestinal bleeding    Transfusion-dependent anemia    CAD (coronary artery disease), native coronary artery    HLD (hyperlipidemia)    Hypothyroidism    AKI (acute kidney injury) (HCC)    Essential hypertension    Pleural effusion on left    Left leg cellulitis    Barrett's esophagus    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 female s/p CABG admitted for dyspnea and volume overload.  ???  AKI  -no significant CKD  -renal ultrasound without hydronephrosis  -no recent nephrotoxins  -creatinine progressively increasing  -I&O appear inaccurate  -FeUrea 8% suggesting renal hypoperfusion  ???  Atrial flutter  Leukocytosis  CAD s/p CABG  Anemia  HTN  Hyponatremia, hypervolemic  Encephalopathy, likely related to hypercapnea  ???  Recommendations:  -renal function near baseline  -torsemide started today  -please call with questions or concerns  ???  [name], MD  PGY-5 Nephrology Fellow  Pager [phone]    Subjective     Diane Savage is a 61 y.o. female torsemide started today, NPO for TEE, patient reports feeling funny today.      Medications     Medications    MEDS    aspirin 81 mg Oral QDAY   cetirizine 10 mg Oral QAM8   diltiazem CD 180 mg Oral QDAY   folic acid 1 mg Oral QDAY   insulin aspart U-100 0-14 Units Subcutaneous ACHS   insulin NPH 18 Units Subcutaneous QDAY(07)   levothyroxine 175 mcg Oral QDAY 30 min before breakfast   magnesium oxide 400 mg Oral BID   melatonin 5 mg Oral QHS   metoprolol tartrate 100 mg Oral BID nystatin  Topical BID   pantoprazole DR 80 mg Oral QDAY(21)   rosuvastatin 40 mg Oral QDAY   senna/docusate 2 tablet Oral BID   sodium chloride 0.9% (NS) 500 mL Intravenous ONCE   torsemide(+) 40 mg Oral QDAY    IV MEDS    Prn acetaminophen Q6H PRN 650 mg at 03/22/17 1814, hydrOXYzine TID PRN 25 mg at 03/26/17 2020, nitroglycerin Q5 MIN PRN, ondansetron Q8H PRN 8 mg at 03/27/17 1116, pramoxine/zinc oxide QDAY PRN, traMADol Q6H PRN 50 mg at 03/27/17 0124           Physical Exam     Vital Signs: Last Filed In 24 Hours Vital Signs: 24 Hour Range   BP: 100/61 (10/25 1422)  Temp: 36.4 ???C (97.6 ???F) (10/25 1400)  Pulse: 66 (10/25 1422)  Respirations: 18 PER MINUTE (10/25 1422)  SpO2: 91 % (10/25 1422)  O2 Delivery: None (Room Air) (10/25 1422) BP: (95-128)/(41-61)   Temp:  [36.3 ???C (97.4 ???F)-36.7 ???C (98.1 ???F)]   Pulse:  [66-95]   Respirations:  [16 PER MINUTE-20 PER MINUTE]   SpO2:  [84 %-96 %]   O2 Delivery: None (Room Air)   Intensity Pain Scale (Self Report): 4 (03/27/17 1422)  Intake/Output Summary (Last 24 hours) at 03/27/17 1436  Last data filed at 03/27/17 1610   Gross per 24 hour   Intake              800 ml   Output               50 ml   Net              750 ml      Vitals:    03/23/17 0400 03/24/17 0305 03/25/17 0510   Weight: 110.5 kg (243 lb 9.6 oz) 108 kg (238 lb) 106.4 kg (234 lb 9.6 oz)     ???  Gen: drowsy, mild respiratory distress   HEENT: Sclera normal; MMM   CV:no JVD, regular rhythm  Pulm: Clear to Auscultation bilateral, diminished  GI: BS+ x4, non-tender to palpation  Neuro: drowsy, moving all ext  Ext: mild ble edema, no clubbing or cyanosis   Skin: no rash, dry      Labs:      Recent Labs      03/25/17   0300  03/26/17   0425  03/27/17   0500   NA  135*  137  136*   K  4.3  4.3  4.2   CL  95*  93*  93*   CO2  34*  37*  36*   GAP  6  7  7    BUN  38*  34*  32*   CR  1.38*  1.32*  1.44*   GLU  145*  129*  135*   CA  9.6  10.1  9.4   MG  2.0  2.3  2.3       Recent Labs      03/24/17 2148  03/25/17   0300  03/26/17   0425  03/27/17   0500   WBC  5.3  4.5  4.4*  4.4*   HGB  8.3*  8.2*  9.1*  8.1*   HCT  25.7*  24.8*  27.5*  25.0*   PLTCT  145*  132*  130*  129*   INR   --   1.6*  1.6*  1.6*   PTT   --   20.4  29.1  30.2      Estimated Creatinine Clearance: 49.5 mL/min (A) (based on SCr of 1.44 mg/dL (H)).  Vitals:    03/23/17 0400 03/24/17 0305 03/25/17 0510   Weight: 110.5 kg (243 lb 9.6 oz) 108 kg (238 lb) 106.4 kg (234 lb 9.6 oz)      No results for input(s): PHART, PO2ART in the last 72 hours.    Invalid input(s): PC02A

## 2017-03-27 NOTE — Discharge Instructions - Appointments
You have multiple follow up appointments scheduled in the future at [facility], as listed on this paper. Please be sure to follow up with your primary care physician after discharge from the hospital as well.     You will need to have a repeat colonscopy performed on an outpatient bases, and have a follow up appointment with [facility] on 04/16/17 at 3:30PM

## 2017-03-27 NOTE — Progress Notes
CLINICAL NUTRITION                                                        Clinical Nutrition Assessment Summary     NAME:Diane Savage             MRN: [id number]             DOB:1956-01-10          AGE: 61 y.o.  ADMISSION DATE: 03/13/2017             DAYS ADMITTED: LOS: 14 days    Nutrition Assessment of Patient:  Malnutrition Assessment: Does not meet criteria  Current Oral Intake: NPO, Inadequate  Estimated Calorie Needs: 1450-1645 kcal/day (22-25 kcal/kg desired body weight 65.8kg)  Estimated Protein Needs: 80gm (1.2gm/kg desired body weight 65.8kg)  Oral Diet Order: NPO    Comments:  61 y.o. female with a PMH of Acquired hypothyroidism, CAD s/p CABG on 02/04/2017, hypertension, dyslipidemia, diabetes, hypothyroidism, CKD, and cellulitis presented with shortness of breath, dizziness/lightheadedness, chills, nausea, lower extremity swelling and palpitations. 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 Pt transferred to CCU for respiratory distress and AMS; now resolved. Pt back on floor. Pt is requiring frequent blood transfusions. Pt has been on and off NPO for procedures. s/p Colonoscopy 10/24. Pt currently remains NPO. Pt lethargic while in room and was unable to gather meaningful information. She did note that prior to admit her appetite and meal intake were not doing well. Pts admit weight of 250# with current body weight of 234# with -8L net I/O since admit and BLE pitting 2+ edema currently documented. RD will continue to monitor pt.    Recommendation:  Recommend advance diet as tolerated to low sodium diet                                   Intervention / Plan:  RD will monitor NPO status   Will monitor labs, meds, wt, gi function, i/os, and need for further nutrition intervention       Nutrition Diagnosis:  Inadequate oral intake  Etiology: Decreased ability to consume sufficient energy Signs & Symptoms: Pt NPO status, GI function                      Goals:  Avoid prolonged NPO status  Time Frame: Within 48 Hours                Marca Ancona RD, LD [phone]

## 2017-03-27 NOTE — Progress Notes
Pre-Operative Assessment for TEE or Cardioversion    Date of Service:  03/27/2017  Diane Savage is a 61 y.o. y.o. female.     DOB: 1955/06/05                  MRN#:  [id number]      Procedure to be performed: TEE  Expected Procedure Date:  03/27/2017  Indication: Atrial Flutter     Patient appears alert and oriented: Yes  NPO: for greater than 8 hours  Inpatient IV status: 20 R U FA    Isolation status: None  Last TEE date: 02/04/2017  Last Cardioversion date: None      Anticoagulation Results   Anticoagulant: none  Missed dose: N/A    Last MAC INR Flow Sheet Entry:    Last recorded Lab results:   INR   Date Value Ref Range Status   03/27/2017 1.6 (H) 0.8 - 1.2 Final   03/26/2017 1.6 (H) 0.8 - 1.2 Final   03/25/2017 1.6 (H) 0.8 - 1.2 Final   03/24/2017 1.5 (H) 0.8 - 1.2 Final   03/23/2017 1.4 (H) 0.8 - 1.2 Final   02/04/2017 1.3 (H) 0.8 - 1.2 Final     APTT   Date Value Ref Range Status   03/27/2017 30.2 20.0 - 36.0 SEC Final     Comment:     NOTE NEW REFERENCE RANGES           Allergies                                        Allergies   Allergen Reactions   ??? Sulfa (Sulfonamide Antibiotics) RASH   ??? Duricef [Cefadroxil] NAUSEA AND VOMITING   ??? Pravastatin NAUSEA AND VOMITING   ??? Simvastatin NAUSEA AND VOMITING          Vitals  Estimated body mass index is 40.27 kg/m??? as calculated from the following:    Height as of this encounter: 1.626 m (5' 4).    Weight as of this encounter: 106.4 kg (234 lb 9.6 oz).      Diagnostic Tests  White Blood Cells   Date Value Ref Range Status   03/27/2017 4.4 (L) 4.5 - 11.0 K/UL Final     Hemoglobin   Date Value Ref Range Status   03/27/2017 8.1 (L) 12.0 - 15.0 GM/DL Final     Hematocrit   Date Value Ref Range Status   03/27/2017 25.0 (L) 36 - 45 % Final     Platelet Count   Date Value Ref Range Status   03/27/2017 129 (L) 150 - 400 K/UL Final     Sodium   Date Value Ref Range Status   03/27/2017 136 (L) 137 - 147 MMOL/L Final     Potassium   Date Value Ref Range Status 03/27/2017 4.2 3.5 - 5.1 MMOL/L Final     Magnesium   Date Value Ref Range Status   03/27/2017 2.3 1.6 - 2.6 mg/dL Final     Blood Urea Nitrogen   Date Value Ref Range Status   03/27/2017 32 (H) 7 - 25 MG/DL Final     Creatinine   Date Value Ref Range Status   03/27/2017 1.44 (H) 0.4 - 1.00 MG/DL Final     Glucose   Date Value Ref Range Status   03/27/2017 135 (H) 70 - 100 MG/DL Final  Blood Cultures     Microbiology - Resulted Micro Last 72 Hrs      CULTURE-BLOOD W/SENSITIVITY  Resulted: 03/25/17 0142, Result status: Final result   Ordering provider:  Laban Emperor, MD  03/19/17 1727 Resulting lab:  [facility]    Specimen Information    Source Collected On   Blood 03/19/17 2158          Components    Component Value Flag   Battery Name BLOOD CULTURE  ???   Specimen Description --  ???   Result:       BLOOD  LEFT  ANTECUBITAL     Special Requests NONE  ???   Culture NO GROWTH 5 DAYS  ???   Report Status --  ???   Result:       FINAL  03/25/2017              CULTURE-BLOOD W/SENSITIVITY  Resulted: 03/25/17 0142, Result status: Final result   Ordering provider:  Laban Emperor, MD  03/19/17 1727 Resulting lab:  [facility]    Specimen Information    Source Collected On   Blood 03/19/17 2150          Components    Component Value Flag   Battery Name BLOOD CULTURE  ???   Specimen Description --  ???   Result:       BLOOD  RIGHT  ANTECUBITAL     Special Requests NONE  ???   Culture NO GROWTH 5 DAYS  ???   Report Status --  ???   Result:       FINAL  03/25/2017                      Echo procedures within the past 30 days:  No results found.        Device Information on File  No results found for: GENERATOR, EPDEVTYP      Current Medications  No current facility-administered medications on file prior to encounter.      Current Outpatient Prescriptions on File Prior to Encounter   Medication Sig Dispense Refill   ??? acetaminophen (TYLENOL) 325 mg tablet Take two tablets by mouth every 6 hours as needed.  0 ??? aspirin 81 mg chewable tablet Chew one tablet by mouth daily with food. 90 tablet 3   ??? cetirizine (ZYRTEC) 10 mg tablet Take 10 mg by mouth every morning.     ??? folic acid (FOLVITE) 1 mg tablet Take 1 mg by mouth daily.     ??? GLUCOSAMINE HCL/CHONDROITIN SU (GLUCOSAMINE-CHONDROITIN PO) Take 1 tablet by mouth twice daily.     ??? insulin aspart U-100 (NOVOLOG) 100 unit/mL injection Inject four Units under the skin three times daily with meals. 10 mL 30   ??? insulin NPH (HUMULIN N NPH U-100 INSULIN) 100 unit/mL injection Inject fourteen Units under the skin every morning. 10 mL 30   ??? Insulin Syringe-Needle U-100 (BD INSULIN SYRINGE ULTRA-FINE) 0.3 mL 31 gauge x 5/16 syrg Use four times daily with Insulin 100 each 0   ??? levothyroxine (SYNTHROID) 175 mcg tablet Take 175 mcg by mouth daily 30 minutes before breakfast.     ??? nitroglycerin (NITROSTAT) 0.4 mg tablet Place 0.4 mg under tongue every 5 minutes as needed for Chest Pain. Max of 3 tablets, call 911.     ??? omeprazole DR(+) (PRILOSEC) 40 mg capsule Take 40 mg by mouth twice daily.     ??? ondansetron (  ZOFRAN) 8 mg tablet Take 8 mg by mouth every 8 hours as needed for Nausea or Vomiting.     ??? senna/docusate (SENOKOT-S) 8.6/50 mg tablet Take two tablets by mouth twice daily. (Patient taking differently: Take 2 tablets by mouth at bedtime daily.) 20 tablet 0   ??? traMADol (ULTRAM) 50 mg tablet Take one tablet by mouth every 6 hours as needed for Pain. 30 tablet 0         Past Medical History  Past Medical History:   Diagnosis Date   ??? Acquired hypothyroidism    ??? Arthritis    ??? Back pain    ??? Bleeding disorder (HCC)    ??? CAD (coronary artery disease), native coronary artery 02/04/2017   ??? Chronic diastolic heart failure (HCC) 03/19/2017   ??? Coronary artery disease    ??? Diabetes mellitus (HCC)     Type II   ??? Essential hypertension 02/11/2017   ??? Hypertension    ??? Stomach disorder    ??? Vision decreased          Past Surgical History  Past Surgical History: Procedure Laterality Date   ??? HX HEART CATHETERIZATION  2017   ??? CORONARY STENT PLACEMENT  2017   ??? CORONARY ARTERY BYPASS GRAFT N/A 02/04/2017    CORONARY ARTERY BYPASS WITH ARTERIAL GRAFT - 4 GRAFTS (Internal Mammary Artery and Endovascular Vein Harvest) performed by Collene Schlichter, MD at [facility]   ??? HX MAZE N/A 02/04/2017    MAZE PROCEDURE performed by Collene Schlichter, MD at [facility]   ??? UPPER GASTROINTESTINAL ENDOSCOPY N/A 03/17/2017    ESOPHAGOGASTRODUODENOSCOPY performed by Jolee Ewing, MD at [facility]   ??? ANKLE SURGERY      Left ankle reconstruction   ??? COLONOSCOPY     ??? HX KNEE ARTHROSCOPY Left    ??? HX TONSIL AND ADENOIDECTOMY     ??? TUBAL LIGATION     ??? UPPER GASTROINTESTINAL ENDOSCOPY           Social History     Social History   Substance Use Topics   ??? Smoking status: Former Smoker     Packs/day: 2.00     Years: 10.00     Types: Cigarettes     Quit date: 09/16/1980   ??? Smokeless tobacco: Never Used   ??? Alcohol use Yes      Comment: Seldom         Additional Comments:  None      Probe Assessment (only for TEE procedures):   Positive for: GERD, Hx. Chest surgery/radiation, Varices - EGD  03/17/2017 and Hx. GI Bleeding      Sedation Assessment:   Positive for: O2 , Sleep Apnea/OSA and CPAP

## 2017-03-27 NOTE — H&P (View-Only)
Pre-Operative Assessment for TEE or Cardioversion    Date of Service:  03/27/2017  Nawal Burling Shehata is a 61 y.o. y.o. female.     DOB: 06/23/1955                  MRN#:  [id number]    History and Physical Exam    I reviewed H&P from 03-13-17 .  No changes noted.    Assessment    I reviewed:    nurse pre-procedure assessment (including past medical history, allergies, medications, labs)    NPO status Acceptable  I assessed the patient's airways and noted:     no abnormalities  I requested Anesthesia consult  I discussed risks and alternatives of this type of sedation and the procedure      with the patient  and she did agree to proceed.    Physical Status Classification (American Society of Anesthesiologists):   Per anesthesia assessment.    Sedation/Medication Plan    Sedation plan per Anesthesiology    Fritzi Mandes, MD, [facility]  Department of [place] [facility]  Pager [phone]

## 2017-03-28 ENCOUNTER — Inpatient Hospital Stay: Admit: 2017-03-23 | Discharge: 2017-03-23 | Payer: MEDICARE

## 2017-03-28 ENCOUNTER — Inpatient Hospital Stay: Admit: 2017-03-19 | Discharge: 2017-03-19 | Payer: MEDICARE

## 2017-03-28 ENCOUNTER — Inpatient Hospital Stay: Admit: 2017-03-18 | Discharge: 2017-03-18 | Payer: MEDICARE

## 2017-03-28 ENCOUNTER — Inpatient Hospital Stay: Admit: 2017-03-26 | Discharge: 2017-03-26 | Payer: MEDICARE

## 2017-03-28 ENCOUNTER — Inpatient Hospital Stay: Admit: 2017-03-27 | Discharge: 2017-03-27 | Payer: MEDICARE

## 2017-03-28 ENCOUNTER — Inpatient Hospital Stay: Admit: 2017-03-21 | Discharge: 2017-03-21 | Payer: MEDICARE

## 2017-03-28 ENCOUNTER — Inpatient Hospital Stay: Admit: 2017-03-24 | Discharge: 2017-03-24 | Payer: MEDICARE

## 2017-03-28 ENCOUNTER — Inpatient Hospital Stay: Admit: 2017-03-22 | Discharge: 2017-03-22 | Payer: MEDICARE

## 2017-03-28 ENCOUNTER — Inpatient Hospital Stay: Admit: 2017-03-14 | Discharge: 2017-03-14 | Payer: MEDICARE

## 2017-03-28 ENCOUNTER — Encounter: Admit: 2017-03-28 | Discharge: 2017-03-28 | Payer: MEDICARE

## 2017-03-28 ENCOUNTER — Emergency Department: Admit: 2017-03-13 | Discharge: 2017-03-13 | Payer: MEDICARE | Attending: Cardiovascular Disease

## 2017-03-28 ENCOUNTER — Inpatient Hospital Stay: Admit: 2017-03-25 | Discharge: 2017-03-25 | Payer: MEDICARE

## 2017-03-28 ENCOUNTER — Inpatient Hospital Stay: Admit: 2017-03-20 | Discharge: 2017-03-20 | Payer: MEDICARE

## 2017-03-28 ENCOUNTER — Inpatient Hospital Stay
Admit: 2017-03-13 | Discharge: 2017-03-28 | Disposition: A | Discharge status: Home / Self Care | Attending: Cardiovascular Disease | Admitting: Cardiovascular Disease

## 2017-03-28 ENCOUNTER — Inpatient Hospital Stay: Admit: 2017-03-17 | Discharge: 2017-03-17 | Payer: MEDICARE

## 2017-03-28 DIAGNOSIS — Z79899 Other long term (current) drug therapy: ICD-10-CM

## 2017-03-28 DIAGNOSIS — K219 Gastro-esophageal reflux disease without esophagitis: ICD-10-CM

## 2017-03-28 DIAGNOSIS — M7989 Other specified soft tissue disorders: ICD-10-CM

## 2017-03-28 DIAGNOSIS — E871 Hypo-osmolality and hyponatremia: ICD-10-CM

## 2017-03-28 DIAGNOSIS — K2950 Unspecified chronic gastritis without bleeding: ICD-10-CM

## 2017-03-28 DIAGNOSIS — I5033 Acute on chronic diastolic (congestive) heart failure: ICD-10-CM

## 2017-03-28 DIAGNOSIS — I2510 Atherosclerotic heart disease of native coronary artery without angina pectoris: ICD-10-CM

## 2017-03-28 DIAGNOSIS — N183 Chronic kidney disease, stage 3 (moderate): ICD-10-CM

## 2017-03-28 DIAGNOSIS — Z23 Encounter for immunization: ICD-10-CM

## 2017-03-28 DIAGNOSIS — B9689 Other specified bacterial agents as the cause of diseases classified elsewhere: ICD-10-CM

## 2017-03-28 DIAGNOSIS — Z955 Presence of coronary angioplasty implant and graft: ICD-10-CM

## 2017-03-28 DIAGNOSIS — Z794 Long term (current) use of insulin: ICD-10-CM

## 2017-03-28 DIAGNOSIS — E1122 Type 2 diabetes mellitus with diabetic chronic kidney disease: ICD-10-CM

## 2017-03-28 DIAGNOSIS — K766 Portal hypertension: ICD-10-CM

## 2017-03-28 DIAGNOSIS — I130 Hypertensive heart and chronic kidney disease with heart failure and stage 1 through stage 4 chronic kidney disease, or unspecified chronic kidney disease: Principal | ICD-10-CM

## 2017-03-28 DIAGNOSIS — I8500 Esophageal varices without bleeding: ICD-10-CM

## 2017-03-28 DIAGNOSIS — K3189 Other diseases of stomach and duodenum: ICD-10-CM

## 2017-03-28 DIAGNOSIS — K644 Residual hemorrhoidal skin tags: ICD-10-CM

## 2017-03-28 DIAGNOSIS — E1159 Type 2 diabetes mellitus with other circulatory complications: ICD-10-CM

## 2017-03-28 DIAGNOSIS — E785 Hyperlipidemia, unspecified: ICD-10-CM

## 2017-03-28 DIAGNOSIS — I252 Old myocardial infarction: ICD-10-CM

## 2017-03-28 DIAGNOSIS — N179 Acute kidney failure, unspecified: ICD-10-CM

## 2017-03-28 DIAGNOSIS — G9341 Metabolic encephalopathy: ICD-10-CM

## 2017-03-28 DIAGNOSIS — I471 Supraventricular tachycardia: ICD-10-CM

## 2017-03-28 DIAGNOSIS — J9622 Acute and chronic respiratory failure with hypercapnia: ICD-10-CM

## 2017-03-28 DIAGNOSIS — K921 Melena: ICD-10-CM

## 2017-03-28 DIAGNOSIS — Z86718 Personal history of other venous thrombosis and embolism: ICD-10-CM

## 2017-03-28 DIAGNOSIS — F419 Anxiety disorder, unspecified: ICD-10-CM

## 2017-03-28 DIAGNOSIS — K2270 Barrett's esophagus without dysplasia: ICD-10-CM

## 2017-03-28 DIAGNOSIS — K5730 Diverticulosis of large intestine without perforation or abscess without bleeding: ICD-10-CM

## 2017-03-28 DIAGNOSIS — Z87891 Personal history of nicotine dependence: ICD-10-CM

## 2017-03-28 DIAGNOSIS — K228 Other specified diseases of esophagus: ICD-10-CM

## 2017-03-28 DIAGNOSIS — D123 Benign neoplasm of transverse colon: ICD-10-CM

## 2017-03-28 DIAGNOSIS — M1990 Unspecified osteoarthritis, unspecified site: ICD-10-CM

## 2017-03-28 DIAGNOSIS — I5032 Chronic diastolic (congestive) heart failure: ICD-10-CM

## 2017-03-28 DIAGNOSIS — D500 Iron deficiency anemia secondary to blood loss (chronic): ICD-10-CM

## 2017-03-28 DIAGNOSIS — J90 Pleural effusion, not elsewhere classified: ICD-10-CM

## 2017-03-28 DIAGNOSIS — I480 Paroxysmal atrial fibrillation: ICD-10-CM

## 2017-03-28 DIAGNOSIS — L03116 Cellulitis of left lower limb: ICD-10-CM

## 2017-03-28 DIAGNOSIS — Z951 Presence of aortocoronary bypass graft: ICD-10-CM

## 2017-03-28 DIAGNOSIS — E039 Hypothyroidism, unspecified: ICD-10-CM

## 2017-03-28 DIAGNOSIS — K31819 Angiodysplasia of stomach and duodenum without bleeding: ICD-10-CM

## 2017-03-28 DIAGNOSIS — R34 Anuria and oliguria: ICD-10-CM

## 2017-03-28 DIAGNOSIS — I4892 Unspecified atrial flutter: ICD-10-CM

## 2017-03-28 DIAGNOSIS — R42 Dizziness and giddiness: ICD-10-CM

## 2017-03-28 DIAGNOSIS — D699 Hemorrhagic condition, unspecified: ICD-10-CM

## 2017-03-28 DIAGNOSIS — H547 Unspecified visual loss: ICD-10-CM

## 2017-03-28 DIAGNOSIS — K319 Disease of stomach and duodenum, unspecified: ICD-10-CM

## 2017-03-28 DIAGNOSIS — I10 Essential (primary) hypertension: ICD-10-CM

## 2017-03-28 DIAGNOSIS — E119 Type 2 diabetes mellitus without complications: ICD-10-CM

## 2017-03-28 DIAGNOSIS — M549 Dorsalgia, unspecified: ICD-10-CM

## 2017-03-28 NOTE — Discharge Instructions - Pharmacy
Physician Discharge Summary      Name: Diane Savage  Medical Record Number: [id number]        Account Number:  [id number]  Date Of Birth:  [birth year]                         Age:  61 years   Admit date:  03/13/2017                     Discharge date:  03/27/2017    Attending Physician:  Dr. Erskine Squibb [place]               Service: Cardiology-1st Round/CCU- [id number]    Physician Summary completed by: Rosaria Ferries, MD    Reason for hospitalization: shortness of breath, dizziness/lightheadedness, chills, nausea, lower extremity swelling and palpitations    Significant PMH:   Past Medical History:   Diagnosis Date   ??? Acquired hypothyroidism    ??? Arthritis    ??? Back pain    ??? Bleeding disorder (HCC)    ??? CAD (coronary artery disease), native coronary artery 02/04/2017   ??? Chronic diastolic heart failure (HCC) 03/19/2017   ??? Coronary artery disease    ??? Diabetes mellitus (HCC)     Type II   ??? Essential hypertension 02/11/2017   ??? Hypertension    ??? Stomach disorder    ??? Vision decreased          Allergies: Sulfa (sulfonamide antibiotics); Duricef [cefadroxil]; Pravastatin; and Simvastatin    Admission Physical Exam notable for:   GENERAL: Alert and oriented x 4, no acute distress, cooperative  EAR/NOSE/THROAT:  Ears are clear bilaterally. Oropharynx pink and moist.  CHEST: Lungs, clear to auscultation bilaterally with no wheezes, rales or rhonchi. No accessory muscle use or respiratory distress. Well healing midline sternotomy scar without any obvious signs of infection   CV: Irregularly irregular rhythm. S1 and S2 noted. No murmurs noted. BUE/BLE pulses 2+.   ABDOMINAL: Obese, Soft, non-tender, non-distended. Bowel Sounds present. No rebound tenderness, no guarding.   EXTREMITIES: No significant deformity or joint abnormality. Significant lower extremity edema left greater than right, well healing saphenous vein graft harvesting scar  HEME/LYMPH: No active bleeding. No cervical, supraclavicular or infraclavicular lymphadenopathy appreciated.       Admission Lab/Radiology studies notable for: Sodium 134, Blood Urea Nitrogen 27, Creatinine 1.62, Glucose 163, Hemoglobin 8.5    Brief Hospital Course:  The patient was admitted and the following issues were addressed during this hospitalization: (with pertinent details).     Mrs. 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     Her Shortness of breathe with lower extremity swelling was evaluated with lower extremity ultrasounds which revealed no evidence of deep vein thrombosis. Chest X-Ray revealed left sided pleural effusion, which required chest tube placement with subsequent removal after resolution. Oxygen was titrated down to room air, and the patient also received intravenous lasix for volume control. She was discharged on 40mg  oral Torsemide for continued volume control.    Her Acute kidney injury on presentation was found to be of prerenal etioogy, likely from cardiorenal syndrome. Nephrology was consulted, and her kidney function improved with diuresis, and  a renal ultrasound was unrevealing for underlying pathology. As stated above, she is being continued on diuresis on return home.    During this hospitalization, she was found to have consistent anemia which was attributed to ger known gastric antral vascular ectasia. She also had a short period of hematochezia, and gastroenterology was consulted for evaluation of her GI bleed, along with recommendation for further anticoagulation with known atrial fibrillation. She underwent esophagogastroduodeonoscopy which showed three columns of esophageal varices, mild portal hypertensive gastropathy without bleeding, along with possible Barrett's esophagus. Colonoscopy was also performed, but was difficult to interpret secondary to insufficient bowel preparation. 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 It is recommended that she follow up with gastroenterology for colonscopy as an outpatient.    Also on admission, she was found on electrocardiogram to be in atrial fibrillation. She was rate controlled on diltiazem and metoprolol 100mg  twice daily. Trans esophageal echocardiogram was performed which showed small residual flow in her left atrial appendage, along with unsuccessful ligation which occurred at the time of her previous coronary artery bypass graft. The risks and benefits of anticoagulation in the setting of her known bleeding history were discussed, and the stroke risk without anticoagulation was also discussed with her. She is being discharged without anticoagulation after these conversations, and has scheduled discharge with Cardiovascular.      Condition at Discharge: Stable    Discharge Diagnoses:      Hospital Problems        Active Problems    * (Principal)Atrial fibrillation with rapid ventricular response (HCC)    Type 2 diabetes mellitus with circulatory disorder, without long-term current use of insulin (HCC)    PAF (paroxysmal atrial fibrillation) (HCC)    Chronic gastrointestinal bleeding    Transfusion-dependent anemia    CAD (coronary artery disease), native coronary artery    HLD (hyperlipidemia)    Hypothyroidism    AKI (acute kidney injury) (HCC)    Essential hypertension    Pleural effusion on left    Left leg cellulitis    Barrett's esophagus Acute on chronic diastolic heart failure due to coronary artery disease (HCC)          Surgical Procedures: None    Significant Diagnostic Studies and Procedures: noted in brief hospital course    Consults:  GI and Nephrology    Patient Disposition: Home       Patient instructions/medications:     BASIC METABOLIC PANEL   Standing Status: Future  Standing Exp. Date: 03/24/18   You will have labs drawn at your appointment on 11/7.     Activity as Tolerated   It is important to keep increasing your activity level after you leave the hospital.  Moving around can help prevent blood clots, lung infection (pneumonia) and other problems.  Gradually increasing the number of times you are up moving around will help you return to your normal activity level more quickly.  Continue to increase the number of times you are up to the chair and walking daily to return to your normal activity level. Begin to work towards your normal activity level at discharge.     Return Appointment   This is a heart failure hospital follow-up visit with a heart failure specialist at the [facility] on the 1st floor of the hospital  within the [facility]- [address] [place], [place] [phone]. This appointment is very important to assess your fluid status, adjust medications, and prevent re-admission to the hospital. Please make every attempt to make it to the appointment.    Please anticipate that this appointment will take approximately 2 hours to complete. Please arrive on time as you have been scheduled for blood work and medication review with the pharmacist prior to seeing your Heart Failure provider. Please bring all medication bottles and pill boxes to this appointment.   [place] Provider BURNS, Diannia Ruder [phone]    Location [facility]    Appointment date: 04/09/2017    Appointment time: 10:00 AM      Return Appointment   This appointment is at the [facility] office at [facility].  The number is [phone]. [place] Provider Baldo Ash [[id number]]    Location [facility]    Appointment date: 05/09/2017    Appointment time: 1:00 PM      Report These Signs and Symptoms   Please contact your doctor if you have any of the following symptoms: uncontrolled pain, difficulty breathing, chest pain or severe abdominal pain     Questions About Your Stay   For questions or concerns regarding your hospital stay:    - DURING BUSINESS HOURS (8:00 AM - 4:30 PM):    Call [phone] and asked to be transferred to your discharge attending physician.    - AFTER BUSINESS HOURS (4:30 PM - 8:00 AM, on weekends, or holidays):  Call [phone] and ask the operator to page the on-call doctor for the discharge attending physician.   Discharging attending physician: Baldo Ash [[id number]]      Cardiac Diet   Limiting unhealthy fats and cholesterol is the most important step you can take in reducing your risk for cardiovascular disease.  Unhealthy fats include saturated and trans fats.  Monitor your sodium and cholesterol intake.  Restrict your sodium to 2g (grams) or 2000mg  (milligrams) daily, and your cholesterol to 200mg  daily.    If you have questions regarding your diet at home, you may contact a dietitian at [phone].          Current Discharge Medication List       START taking these medications    Details   diltiazem CD (CARDIZEM CD) 180 mg capsule Take one capsule by mouth daily.  Qty: 90 capsule, Refills: 3    PRESCRIPTION TYPE:  Normal      magnesium oxide (MAG-OX) 400 mg (241.3 mg magnesium) tablet Take one tablet by mouth twice daily.  Qty: 180 tablet, Refills: 3    PRESCRIPTION TYPE:  Normal      potassium chloride SR (K-DUR) 20 mEq tablet Take one tablet by mouth daily. Take with a meal and a full glass of water.  Qty: 90 tablet, Refills: 3    PRESCRIPTION TYPE:  Normal      pramoxine/zinc oxide (TRONOLANE) 1% / 5% topical cream Insert or Apply  to rectal area as directed daily as needed (For Hemorrhoids). Qty: 28 g, Refills: 0    PRESCRIPTION TYPE:  Normal      torsemide(+) (DEMADEX) 20 mg tablet Take two tablets by mouth daily.  Qty: 30 tablet, Refills: 3    PRESCRIPTION TYPE:  Normal          CONTINUE these medications which have been CHANGED or REFILLED    Details   rosuvastatin (CRESTOR) 40 mg tablet Take one tablet by mouth daily.  Qty: 90 tablet, Refills: 3    PRESCRIPTION TYPE:  Normal          CONTINUE these medications which have NOT CHANGED    Details   acetaminophen (TYLENOL) 325 mg tablet Take two tablets by mouth every 6 hours as needed.  Refills: 0    PRESCRIPTION TYPE:  OTC      aspirin 81 mg chewable tablet Chew one tablet by mouth daily with food.  Qty: 90 tablet, Refills: 3    PRESCRIPTION TYPE:  OTC      cetirizine (ZYRTEC) 10 mg tablet Take 10 mg by mouth every morning.    PRESCRIPTION TYPE:  Historical Med      clobetasol (TEMOVATE) 0.05 % topical cream Apply to affected area twice daily as needed    PRESCRIPTION TYPE:  Historical Med      diphenhydrAMINE (BENADRYL ALLERGY) 25 mg tablet Take 25 mg by mouth at bedtime as needed.    PRESCRIPTION TYPE:  Historical Med      folic acid (FOLVITE) 1 mg tablet Take 1 mg by mouth daily.    PRESCRIPTION TYPE:  Historical Med      GLUCOSAMINE HCL/CHONDROITIN SU (GLUCOSAMINE-CHONDROITIN PO) Take 1 tablet by mouth twice daily.    PRESCRIPTION TYPE:  Historical Med      insulin aspart U-100 (NOVOLOG) 100 unit/mL injection Inject four Units under the skin three times daily with meals.  Qty: 10 mL, Refills: 30    PRESCRIPTION TYPE:  Normal      insulin NPH (HUMULIN N NPH U-100 INSULIN) 100 unit/mL injection Inject fourteen Units under the skin every morning.  Qty: 10 mL, Refills: 30    PRESCRIPTION TYPE:  Normal      Insulin Syringe-Needle U-100 (BD INSULIN SYRINGE ULTRA-FINE) 0.3 mL 31 gauge x 5/16 syrg Use four times daily with Insulin  Qty: 100 each, Refills: 0    PRESCRIPTION TYPE:  Normal  Comments: Please substitute per stock availability levothyroxine (SYNTHROID) 175 mcg tablet Take 175 mcg by mouth daily 30 minutes before breakfast.    PRESCRIPTION TYPE:  Historical Med      metoprolol tartrate (LOPRESSOR) 100 mg tablet Take 100 mg by mouth twice daily.    PRESCRIPTION TYPE:  Historical Med      nitroglycerin (NITROSTAT) 0.4 mg tablet Place 0.4 mg under tongue every 5 minutes as needed for Chest Pain. Max of 3 tablets, call 911.    PRESCRIPTION TYPE:  Historical Med      omeprazole DR(+) (PRILOSEC) 40 mg capsule Take 40 mg by mouth twice daily.    PRESCRIPTION TYPE:  Historical Med      ondansetron (ZOFRAN) 8 mg tablet Take 8 mg by mouth every 8 hours as needed for Nausea or Vomiting.    PRESCRIPTION TYPE:  Historical Med      senna/docusate (SENOKOT-S) 8.6/50 mg tablet Take two tablets by mouth twice daily.  Qty: 20 tablet, Refills: 0    PRESCRIPTION TYPE:  Normal      tiZANidine (ZANAFLEX) 4 mg tablet Take 4 mg by mouth every 6 hours as needed.    PRESCRIPTION TYPE:  Historical Med      traMADol (ULTRAM) 50 mg tablet Take one tablet by mouth every 6 hours as needed for Pain.  Qty: 30 tablet, Refills: 0    PRESCRIPTION TYPE:  Print          The following medications were removed from your list. This list includes medications discontinued this stay and those  removed from your prior med list in our system        furosemide (LASIX) 20 mg tablet        sucralfate (CARAFATE) 1 gram tablet               Scheduled appointments:    Apr 09, 2017 10:00 AM CST  Nurse Lab Visit with [facility]  Cardiovascular Medicine ([facility]) [facility] [address]  [address]  [place] [address]  [phone]   Apr 09, 2017 10:30 AM CST  Hospital Follow Up with Ivory Broad, APRN  Cardiovascular Medicine ([facility]) [facility] [address]  [address]  [place] [address]  [phone]   Apr 16, 2017  3:30 PM CST  Return Patient with Aquilla Hacker of [facility] - Internal [facility]) [facility]  [address] [place] [address]  [phone]   May 09, 2017  1:00 PM CST  RETURN PT LONG with Baldo Ash, MD  Cardiovascular Medicine ([facility]) [facility]  [address]  [place] [address]  [phone]    Additional appointment instructions:      You have multiple follow up appointments scheduled in the future at [facility], as listed on this paper. Please be sure to follow up with your primary care physician after discharge from the hospital as well.     You will need to have a repeat colonscopy performed on an outpatient bases, and have a follow up appointment with [facility] on 04/16/17 at 3:30PM                        Pending items needing follow up: Colonoscopy in 2-3 months time    Signed:  Rosaria Ferries, MD  03/27/2017      cc:  Primary Care Physician:  Sherre Lain   Verified  Referring physicians:  No ref. provider found   Additional provider(s):

## 2017-04-16 ENCOUNTER — Encounter: Admit: 2017-04-16 | Discharge: 2017-04-16 | Payer: MEDICARE

## 2017-04-16 ENCOUNTER — Ambulatory Visit: Admit: 2017-04-16 | Discharge: 2017-04-17 | Payer: MEDICARE

## 2017-04-16 ENCOUNTER — Ambulatory Visit: Admit: 2017-04-16 | Discharge: 2017-04-16

## 2017-04-16 DIAGNOSIS — I2510 Atherosclerotic heart disease of native coronary artery without angina pectoris: Secondary | ICD-10-CM

## 2017-04-16 DIAGNOSIS — R0600 Dyspnea, unspecified: Principal | ICD-10-CM

## 2017-04-16 DIAGNOSIS — I10 Essential (primary) hypertension: ICD-10-CM

## 2017-04-16 DIAGNOSIS — I5032 Chronic diastolic (congestive) heart failure: ICD-10-CM

## 2017-04-16 DIAGNOSIS — E039 Hypothyroidism, unspecified: ICD-10-CM

## 2017-04-16 DIAGNOSIS — K319 Disease of stomach and duodenum, unspecified: ICD-10-CM

## 2017-04-16 DIAGNOSIS — I8500 Esophageal varices without bleeding: ICD-10-CM

## 2017-04-16 DIAGNOSIS — M549 Dorsalgia, unspecified: ICD-10-CM

## 2017-04-16 DIAGNOSIS — D649 Anemia, unspecified: ICD-10-CM

## 2017-04-16 DIAGNOSIS — K7460 Unspecified cirrhosis of liver: ICD-10-CM

## 2017-04-16 DIAGNOSIS — K2270 Barrett's esophagus without dysplasia: ICD-10-CM

## 2017-04-16 DIAGNOSIS — D699 Hemorrhagic condition, unspecified: ICD-10-CM

## 2017-04-16 DIAGNOSIS — K31819 Angiodysplasia of stomach and duodenum without bleeding: ICD-10-CM

## 2017-04-16 DIAGNOSIS — E119 Type 2 diabetes mellitus without complications: ICD-10-CM

## 2017-04-16 DIAGNOSIS — M1990 Unspecified osteoarthritis, unspecified site: ICD-10-CM

## 2017-04-16 DIAGNOSIS — H547 Unspecified visual loss: ICD-10-CM

## 2017-04-18 ENCOUNTER — Encounter: Admit: 2017-04-18 | Discharge: 2017-04-18 | Payer: MEDICARE

## 2017-04-18 NOTE — Telephone Encounter
Received new referral, via internal. Docs in O2.

## 2017-04-21 ENCOUNTER — Encounter: Admit: 2017-04-21 | Discharge: 2017-04-21 | Payer: MEDICARE

## 2017-04-21 NOTE — Telephone Encounter
Pt called and inquired on Chest X-ray on 04/16/17.    Results:    Findings:  Prior median sternotomy and CABG. Persistent enlargement of the cardiomediastinal silhouette with improving edema. Persistent left pleural effusion and bibasilar atelectasis. No pneumothorax.    IMPRESSION  Improving edema with a persistent left pleural effusion and bibasilar   atelectasis.       Attempted to call patient. Left voicemail to return call.  Routing to Dr. Nicholos Johns. [place] to advise if anything further needs to be pursued.

## 2017-04-21 NOTE — Telephone Encounter
Was informed by Dr. Brigitte Pulse to mark Urgent Referral for Hepatology due to anemia, fluid volume overload, esophageal varices, portal hypertension w/ chronic bleeding.    On chart review, referral was marked as urgent. Called outpatient Hepatology scheduling number. Was informed they are aware it is an urgent referral and a nurse will triage it accordingly. As of now, there are 2 pts ahead of this pt.    Routing to Dr. Nicholos Johns. [place] as Juluis Rainier.

## 2017-04-21 NOTE — Telephone Encounter
Service (OLT/Gen Hep/HPB): LTC  Referring: Dr. Caesar Chestnut  Urgency: semi-urgent  Provider: next available   Dx: cirrhosis, GAVE  OV note: O2  Imaging/location: O2  Pathology/location:  Labs: O2    Fatty liver, transfusion dependent anemia, AKI, GI bleed with GAVE, Barrett's. BMI 43, DM, CAD, HTN. Recent DVT.

## 2017-04-22 ENCOUNTER — Encounter: Admit: 2017-04-22 | Discharge: 2017-04-22 | Payer: MEDICARE

## 2017-04-22 NOTE — Telephone Encounter
Thanks  She needs to see Education officer, museum as well.

## 2017-04-22 NOTE — Telephone Encounter
Called pt. Informed her of Social work assistance and offered their Psychologist, counselling. Per pt, she has a Education officer, museum locally and is currently helping her get medicare/medicaide. Will not be active until January. Pt declines Muse social work at this time. Pt requested results on Chest X-ray. See below for results. Pt stated she called cardiology and inquired if she needs to increase torsemide. She has not heard back.    Pt has urgent OV w/ Hepatology on 11/26.  Pt has no other questions or concerns at this time. Will call GI office if other problems arise.

## 2017-04-22 NOTE — Progress Notes
Requested labs from [facility] [place]

## 2017-04-22 NOTE — Telephone Encounter
Called patient to schedule new appointment with Dr. Pamala Hurry  on Monday, April 28, 2017 at 2:00pm.   Verified demos and mailed appointment letter/map.   Pt stated they have current insurance.

## 2017-04-29 ENCOUNTER — Encounter: Admit: 2017-04-29 | Discharge: 2017-04-29 | Payer: MEDICARE

## 2017-05-02 ENCOUNTER — Encounter: Admit: 2017-05-02 | Discharge: 2017-05-02 | Payer: MEDICARE
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
## 2017-05-02 NOTE — Telephone Encounter
Pt's PCP office called to inquire if Dr. Brigitte Pulse is ordering an [place] for pt. Pt called their office asking they they were scheduling [place]. Informed PCP office that yes we ordered imaging. On chart review, radiology called pt to schedule but she did not call back. Gave office number for pt to call to schedule [place].    Pt called and stated she wasn't sure when she should [place] of liver.Informed her she may schedule any time. Gave number for her to call to schedule.  Hepatology will continue to care. Pt verbalized understanding. Had no further questions or concerns.

## 2017-05-15 ENCOUNTER — Encounter: Admit: 2017-05-15 | Discharge: 2017-05-15 | Payer: MEDICARE

## 2017-05-16 ENCOUNTER — Encounter: Admit: 2017-05-16 | Discharge: 2017-05-17

## 2017-05-18 ENCOUNTER — Encounter: Admit: 2017-05-18 | Discharge: 2017-05-18

## 2017-05-18 ENCOUNTER — Encounter: Admit: 2017-05-18 | Discharge: 2017-05-19

## 2017-05-19 ENCOUNTER — Encounter: Admit: 2017-05-19 | Discharge: 2017-05-19

## 2017-05-19 ENCOUNTER — Encounter: Admit: 2017-05-19 | Discharge: 2017-05-20

## 2017-05-20 ENCOUNTER — Encounter: Admit: 2017-05-20 | Discharge: 2017-05-21

## 2017-05-20 ENCOUNTER — Encounter: Admit: 2017-05-20 | Discharge: 2017-05-20 | Payer: MEDICARE

## 2017-05-20 ENCOUNTER — Encounter: Admit: 2017-05-20 | Discharge: 2017-05-20

## 2017-05-20 DIAGNOSIS — I4891 Unspecified atrial fibrillation: ICD-10-CM

## 2017-05-20 MED ORDER — FUROSEMIDE 10 MG/ML IJ SOLN
40 mg | Freq: Once | INTRAVENOUS | 0 refills | Status: CP
Start: 2017-05-20 — End: ?
  Administered 2017-05-21: 06:00:00 40 mg via INTRAVENOUS

## 2017-05-20 MED ORDER — PIPERACILLIN/TAZOBACTAM 4.5 G/NS IVPB (MB+)
4.5 g | INTRAVENOUS | 0 refills | Status: DC
Start: 2017-05-20 — End: 2017-05-21
  Administered 2017-05-21 (×2): 4.5 g via INTRAVENOUS

## 2017-05-20 MED ORDER — VANCOMYCIN 1,750 MG IVPB
1750 mg | Freq: Once | INTRAVENOUS | 0 refills | Status: CP
Start: 2017-05-20 — End: ?
  Administered 2017-05-21 (×2): 1750 mg via INTRAVENOUS

## 2017-05-20 MED ORDER — FENTANYL PCA/DRIP IN NS 1000MCG/100ML
10-100 ug/h | INTRAVENOUS | 0 refills | Status: DC
Start: 2017-05-20 — End: 2017-05-21

## 2017-05-20 MED ORDER — INSULIN ASPART 100 UNIT/ML SC FLEXPEN
0-7 [IU] | Freq: Every day | SUBCUTANEOUS | 0 refills | Status: DC
Start: 2017-05-20 — End: 2017-05-21
  Administered 2017-05-21: 03:00:00 4 [IU] via SUBCUTANEOUS

## 2017-05-20 MED ORDER — VANCOMYCIN PHARMACY TO MANAGE
1 | 0 refills | Status: DC
Start: 2017-05-20 — End: 2017-05-22

## 2017-05-20 MED ORDER — FAMOTIDINE (PF) 20 MG/2 ML IV SOLN
20 mg | Freq: Two times a day (BID) | INTRAVENOUS | 0 refills | Status: DC
Start: 2017-05-20 — End: 2017-05-21

## 2017-05-20 MED ORDER — AMIODARONE IN DEXTROSE,ISO-OSM 360 MG/200 ML (1.8 MG/ML) IV SOLN
0.5-1 mg/min | INTRAVENOUS | 0 refills | Status: DC
Start: 2017-05-20 — End: 2017-05-21
  Administered 2017-05-21 (×2): 1 mg/min via INTRAVENOUS

## 2017-05-20 MED ORDER — IMS MIXTURE TEMPLATE
175 ug | Freq: Every day | ORAL | 0 refills | Status: DC
Start: 2017-05-20 — End: 2017-06-06
  Administered 2017-05-21 – 2017-06-06 (×34): 175 ug via ORAL

## 2017-05-20 MED ORDER — DEXMEDETOMIDINE IV DRIP (STD CONC)
0.2-1 ug/kg/h | INTRAVENOUS | 0 refills | Status: DC
Start: 2017-05-20 — End: 2017-05-21
  Administered 2017-05-21 (×2): 0.3 ug/kg/h via INTRAVENOUS

## 2017-05-20 MED ORDER — PANTOPRAZOLE 40 MG IV SOLR
40 mg | Freq: Two times a day (BID) | INTRAVENOUS | 0 refills | Status: DC
Start: 2017-05-20 — End: 2017-05-21
  Administered 2017-05-21: 03:00:00 40 mg via INTRAVENOUS

## 2017-05-20 MED ORDER — VANCOMYCIN 2,000 MG IVPB
15 mg/kg | Freq: Once | INTRAVENOUS | 0 refills | Status: DC
Start: 2017-05-20 — End: 2017-05-21

## 2017-05-20 MED ORDER — VANCOMYCIN RANDOM DOSING
1 | INTRAVENOUS | 0 refills | Status: DC
Start: 2017-05-20 — End: 2017-05-22

## 2017-05-20 NOTE — Progress Notes
Clinical update received from sending RN.  States that pt converted from afib to sinus tach this morning.  Rate is 120-130.  Hemodynamically stable - given Lopressor po for heart rate.  Remains on Levophed, Amiodarone, Precedex and Fentanyl.

## 2017-05-20 NOTE — Progress Notes
Call to sending to inquire about ETA.  States that pt became unstable and they had to adjust her vent settings.  States that she dropped her SpO2 to 73% while on 60% FiO2.  Vent settings changed to vT 450; Peep 14; Rate 12; FiO2 75%.  Sending will repeat a blood gas at 1430 anc call results to transfer center.

## 2017-05-21 ENCOUNTER — Inpatient Hospital Stay: Admit: 2017-05-21 | Discharge: 2017-05-21 | Payer: MEDICARE

## 2017-05-21 DIAGNOSIS — A419 Sepsis, unspecified organism: Principal | ICD-10-CM

## 2017-05-21 LAB — URINALYSIS, MICROSCOPIC

## 2017-05-21 LAB — BLOOD GASES, ARTERIAL
Lab: 27 MMOL/L (ref 21–28)
Lab: 3.2 MMOL/L
Lab: 46 mmHg — ABNORMAL HIGH (ref 35–45)
Lab: 7.4 K/UL (ref 7.35–7.45)
Lab: 98 % (ref 95–99)
Lab: 27 MMOL/L (ref 21–28)
Lab: 3.9 MMOL/L (ref 80–100)
Lab: 39 mmHg — ABNORMAL LOW (ref 35–45)
Lab: 7.4 M/UL — ABNORMAL HIGH (ref 7.35–7.45)
Lab: 70 mmHg — ABNORMAL LOW (ref 80–100)
Lab: 95 % (ref 95–99)
Lab: 28 MMOL/L — ABNORMAL HIGH (ref 21–28)
Lab: 4.1 MMOL/L
Lab: 46 mmHg — ABNORMAL HIGH (ref 35–45)
Lab: 7.4 (ref 7.35–7.45)
Lab: 83 mmHg (ref 80–100)
Lab: 96 % (ref 95–99)
Lab: 45 mmHg — ABNORMAL LOW (ref >60)
Lab: 7.4 M/UL — ABNORMAL LOW (ref >60)
Lab: 28 MMOL/L — ABNORMAL HIGH (ref 21–28)
Lab: 4.4 MMOL/L
Lab: 52 mmHg — ABNORMAL HIGH (ref 35–45)
Lab: 7.3 (ref 7.35–7.45)
Lab: 96 % (ref 95–99)

## 2017-05-21 LAB — POC GLUCOSE
Lab: 160 mg/dL — ABNORMAL HIGH (ref 70–100)
Lab: 194 mg/dL — ABNORMAL HIGH (ref 70–100)
Lab: 244 mg/dL — ABNORMAL HIGH (ref 70–100)
Lab: 261 mg/dL — ABNORMAL HIGH (ref >60)
Lab: 304 mg/dL — ABNORMAL HIGH (ref 70–100)
Lab: 321 mg/dL — ABNORMAL HIGH (ref 70–100)

## 2017-05-21 LAB — CBC AND DIFF
Lab: 0 10*3/uL (ref 0–0.45)
Lab: 5.8 10*3/uL — ABNORMAL LOW (ref 4.5–11.0)
Lab: 4 10*3/uL — ABNORMAL LOW (ref 4.5–11.0)

## 2017-05-21 LAB — THYROID STIMULATING HORMONE-TSH: Lab: 1.9 uU/mL (ref <1.00)

## 2017-05-21 LAB — COMPREHENSIVE METABOLIC PANEL
Lab: 1 mg/dL (ref 0.3–1.2)
Lab: 11 10*3/uL (ref 3–12)
Lab: 14 U/L — ABNORMAL LOW (ref 7–40)
Lab: 140 MMOL/L — ABNORMAL LOW (ref 137–147)
Lab: 26 MMOL/L (ref 21–30)
Lab: 28 mL/min — ABNORMAL LOW (ref >60)
Lab: 3 g/dL — ABNORMAL LOW (ref 3.5–5.0)
Lab: 34 mL/min — ABNORMAL LOW (ref >60)
Lab: 51 U/L — ABNORMAL LOW (ref 25–110)
Lab: 6 U/L — ABNORMAL LOW (ref 7–56)
Lab: 6.5 g/dL (ref 6.0–8.0)
Lab: 9 mg/dL — ABNORMAL HIGH (ref 8.5–10.6)
Lab: 139 MMOL/L — ABNORMAL HIGH (ref >60)

## 2017-05-21 LAB — PHOSPHORUS
Lab: 3.6 mg/dL — ABNORMAL LOW (ref 2.0–4.5)
Lab: 3.2 mg/dL (ref 2.0–4.5)

## 2017-05-21 LAB — STREPTOCOCCUS PNEUMO AG, URINE: Lab: NEGATIVE mL/min — ABNORMAL LOW (ref >60)

## 2017-05-21 LAB — URINALYSIS DIPSTICK
Lab: NEGATIVE
Lab: NEGATIVE
Lab: POSITIVE — AB

## 2017-05-21 LAB — TROPONIN-I
Lab: 0 ng/mL — ABNORMAL HIGH (ref 0.0–0.05)
Lab: 0 ng/mL (ref 0.0–0.05)

## 2017-05-21 LAB — AMMONIA: Lab: 95 umol/L — ABNORMAL HIGH (ref 9–35)

## 2017-05-21 LAB — CREATININE-URINE RANDOM: Lab: 94 mg/dL

## 2017-05-21 LAB — GRAM STAIN

## 2017-05-21 LAB — VANCOMYCIN TIMED LEVEL: Lab: 10 ug/mL

## 2017-05-21 LAB — TSH WITH FREE T4 REFLEX: Lab: 2.8 uU/mL (ref 0.35–5.00)

## 2017-05-21 LAB — UREA NITROGEN-URINE RANDOM: Lab: 757 mg/dL

## 2017-05-21 LAB — FREE T4 (FREE THYROXINE) ONLY: Lab: 1 ng/dL (ref 0.6–1.6)

## 2017-05-21 LAB — LEGIONELLA ANTIGEN URINE,RAN

## 2017-05-21 LAB — LACTIC ACID (BG - RAPID LACTATE): Lab: 1.3 MMOL/L (ref 0.5–2.0)

## 2017-05-21 LAB — O2 SATURATION, CENTRAL VENOUS: Lab: 83 %

## 2017-05-21 LAB — MAGNESIUM
Lab: 2 mg/dL — ABNORMAL LOW (ref 1.6–2.6)
Lab: 2 mg/dL — ABNORMAL LOW (ref 1.6–2.6)

## 2017-05-21 LAB — PROTIME INR (PT): Lab: 1.3 MMOL/L — ABNORMAL HIGH (ref 0.8–1.2)

## 2017-05-21 LAB — BNP (B-TYPE NATRIURETIC PEPTI): Lab: 247 pg/mL — ABNORMAL HIGH (ref 0–100)

## 2017-05-21 LAB — SODIUM-URINE RANDOM: Lab: 10 MMOL/L

## 2017-05-21 LAB — PROCALCITONIN: Lab: 0.2 ng/mL — ABNORMAL HIGH (ref <0.10)

## 2017-05-21 MED ORDER — INSULIN ASPART 100 UNIT/ML SC FLEXPEN
0-14 [IU] | Freq: Before meals | SUBCUTANEOUS | 0 refills | Status: DC
Start: 2017-05-21 — End: 2017-05-22

## 2017-05-21 MED ORDER — DOXYCYCLINE HYCLATE 100 MG PO TAB
100 mg | Freq: Two times a day (BID) | ORAL | 0 refills | Status: CP
Start: 2017-05-21 — End: ?
  Administered 2017-05-21 – 2017-05-26 (×10): 100 mg via ORAL

## 2017-05-21 MED ORDER — MAGNESIUM OXIDE 400 MG (241.3 MG MAGNESIUM) PO TAB
400 mg | Freq: Two times a day (BID) | ORAL | 0 refills | Status: DC
Start: 2017-05-21 — End: 2017-06-06
  Administered 2017-05-22 – 2017-06-05 (×31): 400 mg via ORAL

## 2017-05-21 MED ORDER — PERFLUTREN LIPID MICROSPHERES 1.1 MG/ML IV SUSP
1-20 mL | Freq: Once | INTRAVENOUS | 0 refills | Status: CP
Start: 2017-05-21 — End: ?
  Administered 2017-05-21: 16:00:00 5 mL via INTRAVENOUS

## 2017-05-21 MED ORDER — CEFEPIME IN DEXTROSE,ISO-OSM 2 GRAM/100 ML IV PGBK
2 g | Freq: Two times a day (BID) | INTRAVENOUS | 0 refills | Status: DC
Start: 2017-05-21 — End: 2017-05-22
  Administered 2017-05-21 – 2017-05-22 (×3): 2 g via INTRAVENOUS

## 2017-05-21 MED ORDER — CHLORHEXIDINE GLUCONATE 0.12 % MM MWSH
15 mL | Freq: Two times a day (BID) | 0 refills | Status: DC
Start: 2017-05-21 — End: 2017-05-21

## 2017-05-21 MED ORDER — HEPARIN, PORCINE (PF) 5,000 UNIT/0.5 ML IJ SYRG
5000 [IU] | SUBCUTANEOUS | 0 refills | Status: DC
Start: 2017-05-21 — End: 2017-05-26
  Administered 2017-05-22 – 2017-05-26 (×10): 5000 [IU] via SUBCUTANEOUS

## 2017-05-21 MED ORDER — PANTOPRAZOLE(#) 2MG/ML PO SUSP
40 mg | Freq: Two times a day (BID) | ORAL | 0 refills | Status: DC
Start: 2017-05-21 — End: 2017-05-25
  Administered 2017-05-21 – 2017-05-24 (×6): 40 mg via ORAL

## 2017-05-21 MED ORDER — ASPIRIN 81 MG PO CHEW
81 mg | Freq: Every day | ORAL | 0 refills | Status: DC
Start: 2017-05-21 — End: 2017-06-06
  Administered 2017-05-22 – 2017-06-06 (×16): 81 mg via ORAL

## 2017-05-21 MED ORDER — FUROSEMIDE 40 MG PO TAB
40 mg | Freq: Every day | ORAL | 0 refills | Status: DC
Start: 2017-05-21 — End: 2017-05-22
  Administered 2017-05-22: 15:00:00 40 mg via ORAL

## 2017-05-21 MED ORDER — FOLIC ACID 1 MG PO TAB
1 mg | Freq: Every day | ORAL | 0 refills | Status: DC
Start: 2017-05-21 — End: 2017-05-22
  Administered 2017-05-22: 15:00:00 1 mg via ORAL

## 2017-05-21 MED ORDER — LACTULOSE 10 GRAM/15 ML PO SOLN
30 mL | Freq: Three times a day (TID) | ORAL | 0 refills | Status: DC
Start: 2017-05-21 — End: 2017-05-23
  Administered 2017-05-21 – 2017-05-22 (×3): 20 g via ORAL

## 2017-05-21 MED ORDER — BUMETANIDE 2 MG PO TAB
2 mg | Freq: Every day | ORAL | 0 refills | Status: DC
Start: 2017-05-21 — End: 2017-05-21

## 2017-05-21 MED ORDER — DILTIAZEM HCL 60 MG PO TAB
30 mg | ORAL | 0 refills | Status: DC
Start: 2017-05-21 — End: 2017-05-22
  Administered 2017-05-22 (×3): 30 mg via ORAL

## 2017-05-21 MED ORDER — METOPROLOL TARTRATE 50 MG PO TAB
50 mg | Freq: Two times a day (BID) | ORAL | 0 refills | Status: DC
Start: 2017-05-21 — End: 2017-05-22
  Administered 2017-05-21 – 2017-05-22 (×3): 50 mg via ORAL

## 2017-05-21 MED ORDER — FUROSEMIDE 10 MG/ML IJ SOLN
40 mg | Freq: Once | INTRAVENOUS | 0 refills | Status: CP
Start: 2017-05-21 — End: ?
  Administered 2017-05-21: 18:00:00 40 mg via INTRAVENOUS

## 2017-05-21 MED ORDER — ~~LOC~~ CLOG DESTROYER(#) (ZENPEP 20,000 + SODIUM BICARB CAPSULE)
1 | GASTROSTOMY | 0 refills | Status: DC | PRN
Start: 2017-05-21 — End: 2017-05-25

## 2017-05-21 MED ORDER — BISACODYL 10 MG RE SUPP
10 mg | Freq: Once | RECTAL | 0 refills | Status: CP
Start: 2017-05-21 — End: ?
  Administered 2017-05-21: 23:00:00 10 mg via RECTAL

## 2017-05-21 MED ORDER — POTASSIUM CHLORIDE 20 MEQ PO TBTQ
20 meq | Freq: Every day | ORAL | 0 refills | Status: DC
Start: 2017-05-21 — End: 2017-05-22
  Administered 2017-05-22: 15:00:00 20 meq via ORAL

## 2017-05-21 MED ORDER — CEFEPIME IN DEXTROSE,ISO-OSM 2 GRAM/100 ML IV PGBK
2 g | INTRAVENOUS | 0 refills | Status: DC
Start: 2017-05-21 — End: 2017-05-21

## 2017-05-21 NOTE — Progress Notes
Pharmacy Vancomycin Note  Subjective:   Diane Savage is a 61 y.o. female being treated for empiric sepsis/PNA covg.    Objective:     Current Vancomycin Orders   Medication Dose Route Frequency    vancomycin, pharmacy to manage  1 each Service Per Pharmacy    vancomycin, random dosing  1 each Intravenous Random Dosing     Start Date of  Vancomycin therapy: 05/20/2017    White Blood Cells   Date/Time Value Ref Range Status   05/21/2017 0340 4.0 (L) 4.5 - 11.0 K/UL Final   05/20/2017 1831 5.8 4.5 - 11.0 K/UL Final     Creatinine   Date/Time Value Ref Range Status   05/21/2017 0340 1.79 (H) 0.4 - 1.00 MG/DL Final   05/20/2017 1831 1.84 (H) 0.4 - 1.00 MG/DL Final     Blood Urea Nitrogen   Date/Time Value Ref Range Status   05/21/2017 0340 39 (H) 7 - 25 MG/DL Final     Estimated CrCl: ~40    Intake/Output Summary (Last 24 hours) at 05/21/2017 0805  Last data filed at 05/21/2017 0700  Gross per 24 hour   Intake 934 ml   Output 1020 ml   Net -86 ml      Actual Weight:  131.3 kg (289 lb 7.4 oz)  Dosing BW:  130 kg   Drug Levels:  Vancomycin Random   Date/Time Value Ref Range Status   05/20/2017 2200 10.5 MCG/ML Final       Assessment:   Target levels for this patient: Re-dose ~15.    Plan:   1. Start vanco random dosing; level 10.5 on arrival from OSH and given 1.75 gm x1 early this AM.   2. Next scheduled level(s): Tentative 12/20 AM labs  3. Pharmacy will continue to monitor and adjust therapy as needed.    Donnie Coffin, [facility]  05/21/2017

## 2017-05-21 NOTE — Progress Notes
Spontaneous breathing trial was started at 0830 on PS 5 / PEEP 5 FiO2 40%. Pre-trial NIF was -43 cmH2O (not able to get Vc though). Patient is awake and tried to pull breathing tube with mists on. Vitals are stable on trial. ET tube cuff leak is present.

## 2017-05-21 NOTE — Progress Notes
SPEECH-LANGUAGE PATHOLOGY  CLINICAL SWALLOW ASSESSMENT     EVALUATION SUMMARY  Summary: Clinical swallow evaluation completed this date. Suspect moderate-severe oropharyngeal dysphagia present at this time. Factors impacting dysphagia at this time include: recent intubation (12/17-12/19) and current mental status. Overt s/s of aspiration noted across consistencies provided this date including ice chips. Pt's spouse present during evaluation. Pt and spouse do not report any previous swallowing difficulty. Mild-moderately vocal quality is an indication of decreased airway protection during the swallow. Anticipate given time, pt's swallow function will improve. Please see further details below.    RECOMMENDATIONS:  Remain NPO at this time. Consider temporary alternative source of nutrition/medication/hydration.  Frequent oral care to minimize risk for aspiration of bacteria in secretions.  Moistened oral swabs with RN for oral comfort/moisture and to facilitate functional swallow, only as tolerated.  Ongoing dysphagia assessment and treatment.    Oral Stage Summary*: PO trials of ice chips, thin and nectar thick liquids via tsp/cup, and pureed solids provided this date. Pt able to withdraw boluses with assistance feeding. Suspect reduced formation and minimally delayed AP transfer of boluses due to weakness and/or mental status. No chewable solids provided. No anterior bolus spillage nor oral residue noted.     Pharyngeal Stage Summary*: Swallow initiation appeared minimally delayed. Laryngeal elevation reduced as judged via laryngeal palpation. Cough noted throughout evaluation, prior to and following PO trials. Consistent, delayed and immediate cough response with thin and nectar thick liquids provided this date. Delayed cough/throat clear also noted with ice chips and pureed solids this date. Multiple swallows (2-4 per bolus) noted intermittently across consistencies indicating pharyngeal residue and/or laryngeal penetration.     Swallow Recommendations*  NPO: Temporary Non-Oral Nutrition    Plan: Continue Treatment 3-5x/week.    Prognosis: Fair, Good  NOMS Dysphagia Rating: 2-Moderately-Severe Dysphagia -Not able to swallow safely by mouth for nutrition/hydration but may take some consistency w/ consistent max cues in therapy only. Alternative method of feeding required.  Results Reported to Physician: Yes    Objective*  Relevant Med Background: 61 y.o. 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 She was tx'd from [facility] on 12/18 after presenting there on 12/16 for syncope/fatigue. Admit c/b AMS/respiratory failure requiring intubation, afib w/ RVR & septic shock from HCAP + UTI. On arrival NE was able to be stopped & she was extubated the following AM. W/ hx of HF & no longer in shock have cautiously resumed diuresis. Afib has been rate controlled here, will DC amio & resume metoprolol at lower than PTA dose. Continues on cefepime, doxy & vanco for HCAP & UTI.     CXR: Interval intubation with persistence of left pleural effusion and   atelectasis left lower lobe.    Handedness: Right  Lives With: Spouse  Psychosocial Status: Willing and Cooperative to Participate  Persons Present: Spouse    Subjective*  Pain: Patient has no complaint of pain  Trach Presence: No  Feeding Tube Present During Eval: None    Nutrition*  Nutrition Prior To Hospitalization: Oral, Regular, Thin Liquids  Current Form Of Nutrition: NPO    Oral Mech Exam  Oral Mech WFL*: No  Oral Mech Exam Summary*: Some difficulty following commands this date to complete oral mech, pt required several repetitions to complete  some directions. Lingual protrusion to midline, lateral ROM appeared adequate. Pt demonstrated difficulty following commands for lingual strength task. Facial symmetry appreciated at rest. Jaw strength and ROM appeared adequate. Vocal quality was mild-moderately hoarse/weak. Volitional cough slightly weak. Lower dentition intact, wears full set of dentures on top but these are not present at hospital, per spouse.    Swallow Strategies  Small Bites and Sips: Ineffective  Slow Rate of Intake: Ineffective    Education*  Persons Educated: Pt/Family  Barriers To Learning: Cognitive Deficits, Decreased Alertness  Interventions: Family Educated, Haematologist Educated  Teaching Methods: Verbal, Demonstration  Topics: Dysphagia  Patient Response: Verbalized Understanding, More Instruction Required  Goal Formulation: With Pt/Family    Clinical Swallow Goals*  Goal : Pt will participate in ongoing dysphagia assesment and treatment given mild-mod cues.     Therapist: Althea Charon, MHS, L/CCC-SLP Voalte: [phone]  Date:05/21/2017

## 2017-05-21 NOTE — Progress Notes
Patient arrived to room # [phone] via cart accompanied by flight team. Patient transferred to the bed with assistance. Bedside safety checks completed. Initial patient assessment completed, refer to flowsheet for details. Admission skin assessment completed by:     Pressure Injury Present on Hospital Admission (within 24 hours): No    1. Occiput: No  2. Ear: No  3. Scapula: No  4. Spinous Process: No  5. Shoulder: No  6. Elbow: No  7. Iliac Crest: No  8. Sacrum/Coccyx: No  9. Ischial Tuberosity: No  10. Trochanter: No  11. Knee: No  12. Malleolus: No  13. Heel: No  14. Toes: No  15. Assessed for device associated injury Yes  16. Nursing Nutrition Assessment Completed No    See Doc Flowsheet for additional wound details.     INTERVENTIONS:

## 2017-05-21 NOTE — Case Management (ED)
Case Management Admission Assessment    NAME:Diane Savage                          MRN: [id number]             DOB:08/20/1955          AGE: 61 y.o.  ADMISSION DATE: 05/20/2017             DAYS ADMITTED: LOS: 1 day      Today???s Date: 05/21/2017    Source of Information: This NCM introduced self and explained role of CM. Patient resting in bed, Spouse and Daughter at bedside and able to answer all questions.       Plan  Plan: Case Management Assessment, Assist PRN with SW/NCM Services, Discharge Planning for Home Anticipated     ??? Infectious work up  ??? Wean from Vent as tolerated  ??? Consult PT/OT  ??? DC planning on going  ??? Continue ICU care    Interventions  ? Support    Patient lives at home with Spouse and required very little assistance prior to admission. Patient's Daughter, Noreene Larsson , lives close and is support for family.         Patient gets her meds and care at [facility] [place]  [phone]. She follows with Dr. Sherre Lain.         Patient gets her INR drawn at [facility] [phone] F [phone].  ? Info or Referral      ? Discharge Planning      ? Medication Needs      ? Financial      ? Legal      ? Other        Disposition  ? Expected Discharge Date    Expected Discharge Date: 05/26/17  ? Transportation   Does the patient need discharge transport arranged?: No  Transportation Name, Phone and Availability #1: [facility]  [phone]  Does the patient use Medicaid Transportation?: No  ? Next Level of Care (Acute Psych discharges only)      ? Discharge Disposition                                          Durable Medical Equipment      No service has been selected for the patient.      [facility]      No service has been selected for the patient.      [facility]      No service has been selected for the patient.      [facility]      No service has been selected for the patient.            Patient Address/Phone  [address]  [place] [address] [phone] (home)     Emergency Contact  Extended Emergency Contact Information  Primary Emergency Contact: Ellenie, Salome States  Home Phone: [phone]  Mobile Phone: [phone]  Relation: Spouse  Secondary Emergency Contact: Jonelle Sidle States  Home Phone: [phone]  Mobile Phone: [phone]  Relation: Daughter    Healthcare Directive  Healthcare Directive: No, patient does not have a healthcare directive  Would patient like to fill out a (a new) Healthcare Directive?: Yes, referral to Social Work  Transportation  Does the patient need discharge transport arranged?: No  Transportation Name, Phone and Availability #1: [facility]  [phone]  Does the patient use Medicaid Transportation?: No    Expected Discharge Date  Expected Discharge Date: 05/26/17    Living Situation Prior to Admission  ? Living Arrangements  Type of Residence: Home, independent  Living Arrangements: Spouse/significant other  Financial risk analyst / Tub: Psychologist, counselling  How many levels in the residence?: 1  Can patient live on one level if needed?: N/A  Does residence have entry and/or side stairs?: No(ramp)  Assistance needed prior to admit or anticipated on discharge: No  Who provides assistance or could if needed?: Spouse and Daughter  Are they in good health?: Yes  Can support system provide 24/7 care if needed?: Yes  ? Level of Function   Prior level of function: Independent  ? Cognitive Abilities   Cognitive Abilities: Alert and Oriented, Participates in decision making    Financial Resources  ? Coverage  Primary Insurance: Medicaid Pending  Additional Coverage: [facility] [place]  [phone])    ? Source of Income   Source Of Income: SSDI  ? Financial Assistance Needed?      Psychosocial Needs  ? Mental Health  Mental Health History: No  ? Substance Use History     ? Other      Current/Previous Services  ? PCP  Sherre Lain, [phone], [phone]  ? Pharmacy [facility] [place] [place] - [address]  [address] [place] [address]  Phone: [phone] Fax: [phone]    [facility])  [phone] [address]  [address]  [place] [address]  Phone: [phone] Fax: [phone]    ? Durable Medical Equipment   Durable Medical Equipment at home: Bernardo Heater, Oxygen([facility] [place] [phone])  ? Home Health  Receiving home health: No  ? Hemodialysis or Peritoneal Dialysis  Undergoing hemodialysis or peritoneal dialysis: No  ? Tube/Enteral Feeds  Receive tube/enteral feeds: No  ? Infusion  Receive infusions: No  ? Private Duty  Private duty help used: No  ? Home and Community Based Services  Home and community based services: No  ? [employer]: N/A  ? Hospice  Hospice: No  ? Outpatient Therapy  PT: No  OT: No  SLP: No  ? Skilled Nursing Facility/Nursing Home  SNF: No  NH: No  ? Inpatient Rehab  IPR: No  ? Long-Term Acute Care Hospital  LTACH: No  ? Acute Hospital Stay  Acute Hospital Stay: In the past  Was patient's stay within the last 30 days?: No  When did patient receive care?: 03/13/17  Name of hospital: [facility]    Lenon Ahmadi RN, BSN  Nurse Case Manager  [phone] or [phone]

## 2017-05-21 NOTE — Progress Notes
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  Upon arrival we were able to stop vasopressor support. This AM we were able to extubate following SBT.    Following extubation she is sleepy but able to converse. No use of lactulose/rifaxmin for hepatic encephalopathy in the past. Will hold on this for now and monitor  On exam she continues to have significant dependent pitting edema.  Plan to continue diuresis.   Broad abx continued for HCAP and UTI. F/u outside cultures  Now onset afib since CAGB. Decision made to not anticoagulate given transfusion dependence. At last discharge she was on rate control with metoprolol and diltz. Will stop amiodarone which was been started at [facility] for rate control in setting of shock.  Resume PTA metoprolol. If tolerates can resume diltiazem as well.  Bedside US today without clear pocket for paracentesis. Note did not have ascites on 02/2017 abd [place].  Continue ICU care.  Prognosis guarded.    The patient is critically ill with problems as listed above.   I spent 45 mins time in addition to time spent by the NP performing critical care services including radiology review, medication and record review, abx review, vent mngt, hemodynamic monitoring, serial assessment and coordination of care.  Ailene Ravel, MD

## 2017-05-21 NOTE — H&P (View-Only)
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    Neuro  Sedation/AMS  - patient received 15mg  versed in route by EMS, with hx of newly diagnosed liver disease  - unclear etiology, hypercapneia vs. Metabolic encephalopathy from liver disease vs. Iatrogenic from medication vs. Hypothyroidism/myxedema,   Plan  > monitor off sedation  > if agitated started propofol/fentanyl  > Ammonia level, if elevated consider lactulose/refaxamin     Pulmonary  Acute Hypoxic and hypercapneic Respiratory   Pneumonia/consolidation  - CT at OSH, no images at this point  - thoracic [place] consitent w/ consolidation, no clear pleural effusion visualized      Hx Recent pleural effusion  - drained by CTS after CABG  - reported evaluated by surgery at OSH 12/17 with no further thoracentesis  Plan  Obtain outside CT scan    History of Obesity Hypoventillation  - limited PFTs in records with restrictive but no obstructive defect w/ BMI 50     Cardiovascular  CAD s/p CABG September 2018  HFpEF  - PTA torsemide, metoprolol 100mg , diltiazem 180mg   - POC echo w/ decreased EF  - Last Echo EF 65% - Grossly volume overloaded on exam  Plan  - holding metoprolol/dilt  - BNP, trop  - Echo ordered  - pending hemodynamics likely IV diuresis    Afib with RVR, s/p atrial appendage ligation  - not on A/C due to GAVE  - PTA metoprolol/cardiazem as above  - Amio loaded at OSH per transfer report   Plan  - monitor, currently rates 90s-100s  - if RVR can try low dose metoprolol/dilt prn    GI  GAVE w/ transfusion dependent Anemia  - Hg 8.4 at OSH  - follows with GI, recent endoscopy 10/15 w/ 3 columns of small varices, PGH, and mild GAVE  Plan  - trend Hg  - IV BID PPI    Cirrhosis  - U/S 02/2017 w/ diffuse hepatic steatosis  - Varices  Plan  - monitor MELD labs (inr 1.2 at osh)  - consider Hepatology consult if not clinically improving  - PPI ppx as above    Renal  AKI on CKD  - baseline Cr 1.3, 2.4 on arrival at OSH  - unclear  etiology, likely distributive w/ liver disease and apparent sepsis, concerned for congestive w/ fluid overload and hx of heart failure  - Patient also reportedly on pyridium w/ orange secretions  Plan  - repeat Cr  - Urine lytes' (pre-renal vs intrinsic)  - monitor Uop  - avoid nephrotoxins    Endocrine  Acquired Hypothyroidism  - PTA TSH qday  Plan  > TSH, T4  > Restart     IDDM2  - PTA NPH 14 units and 4 aspart w/ meals  - BG 300 on admission  Plan  - Moderate dose correction factor ACHS  - if poorly controlled, restart low dose lantus  - Enteral feeds w/ OG once stable      ID:    Septic Shock  - given Vanc/Zosyn  - reports of PICC line for extended period for transfusions  - hx of levaquin and zosyn resistant E coli  - Concern for PNA with lung infiltration  - UA clean at OSH  - no leukocytosis, patient tachy, tachypneic, hypothermic on arrival  - multiple hospitalizations in last month, surgical procedures, high risk for HCAP  Plan  - blood and resp cultures obtained  - procal, repeat UA  - Continue Vancomycin/Zosyn with clinical improvement and negative urinary source - Diagnostic paracentesis if ascites for SBP R/O      Prophylaxis Review:  Lines:  Yes; Arterial Line; Indication:  Continuous BP monitoring; Location:  Femoral  Central Line; Indication:  Med not deliverable peripherally; Type:  Internal jugular  Urinary Catheter:  Yes; Retain foley due to:  Acute renal insufficiency or failure  Antibiotic Usage:  Yes; Infection present or suspected:  Lung;  Pneumonia  VTE:  Pharmacological prophylaxis; Contraindication:  Bleeding risk; transfusion dependent GI losses    Disposition/Family:   intubated/sedated  Code Status:  Full Code  __________________________________________________________________________________  Primary Care Physician: Sherre Lain  PCP Unknown    Chief Complaint:  Respiratory Failure  History of Present Illness:  Diane Savage is a 61 y.o. female who presented [facility] EMS 12/16 AM for fall without head trauma and subsequently was found to be hypothermic, became hypotensive, altered, and required intubation.  Patient developed shock requiring pressors and transfer to ICU      Past Medical History:   Diagnosis Date   ??? Acquired hypothyroidism    ??? Arthritis    ??? Back pain    ??? Bleeding disorder (HCC)    ??? CAD (coronary artery disease), native coronary artery 02/04/2017   ??? Chronic diastolic heart failure (HCC) 03/19/2017   ??? Coronary artery disease    ??? Diabetes mellitus (HCC)     Type II   ??? Essential hypertension 02/11/2017   ??? Hypertension    ??? Stomach disorder    ??? Vision decreased      Past Surgical History:   Procedure Laterality Date   ??? HX HEART CATHETERIZATION  2017   ??? CORONARY STENT PLACEMENT  2017   ??? ANKLE SURGERY      Left ankle reconstruction   ??? COLONOSCOPY     ??? HX KNEE ARTHROSCOPY Left    ??? HX TONSIL AND ADENOIDECTOMY     ??? TUBAL LIGATION     ??? UPPER GASTROINTESTINAL ENDOSCOPY       Family History   Problem Relation Age of Onset   ??? Diabetes Mother    ??? Heart Disease Mother    ??? High Cholesterol Mother ??? Arthritis-rheumatoid Mother    ???  Stroke Mother    ??? Thyroid Disease Mother    ??? Coronary Artery Disease Mother    ??? Hyperlipidemia Mother    ??? Cancer Father    ??? Cancer-Breast Sister    ??? Cancer Sister    ??? Hypertension Sister    ??? Heart Disease Sister    ??? High Cholesterol Sister    ??? Arthritis-osteo Sister    ??? Migraines Sister    ??? Rashes/Skin Problems Sister    ??? Depression Sister    ??? Hyperlipidemia Sister    ??? Cancer Brother    ??? Hypertension Brother    ??? Arthritis-rheumatoid Brother    ??? Rashes/Skin Problems Brother    ??? Coronary Artery Disease Brother    ??? Heart Disease Maternal Aunt    ??? Coronary Artery Disease Paternal Uncle    ??? Hyperlipidemia Paternal Uncle    ??? Cancer Maternal Grandmother    ??? Diabetes Maternal Grandmother    ??? Heart Disease Maternal Grandmother    ??? Cancer-Colon Paternal Grandmother    ??? Cancer Paternal Grandmother    ??? Diabetes Paternal Grandmother    ??? Cancer Paternal Grandfather      Social History     Socioeconomic History   ??? Marital status: Married     Spouse name: Not on file   ??? Number of children: Not on file   ??? Years of education: Not on file   ??? Highest education level: Not on file   Social Needs   ??? Financial resource strain: Not on file   ??? Food insecurity - worry: Not on file   ??? Food insecurity - inability: Not on file   ??? Transportation needs - medical: Not on file   ??? Transportation needs - non-medical: Not on file   Occupational History   ??? Not on file   Tobacco Use   ??? Smoking status: Former Smoker     Packs/day: 2.00     Years: 10.00     Pack years: 20.00     Types: Cigarettes     Last attempt to quit: 09/16/1980     Years since quitting: 36.6   ??? Smokeless tobacco: Never Used   Substance and Sexual Activity   ??? Alcohol use: Yes     Comment: Seldom   ??? Drug use: No   ??? Sexual activity: Not on file   Other Topics Concern   ??? Not on file   Social History Narrative   ??? Not on file     Immunizations (includes history and patient reported): There is no immunization history for the selected administration types on file for this patient.   Allergies:  Sulfa (sulfonamide antibiotics); Duricef [cefadroxil]; Pravastatin; and Simvastatin    Medications:    Medications Prior to Admission   Medication Sig   ??? acetaminophen (TYLENOL) 325 mg tablet Take two tablets by mouth every 6 hours as needed.   ??? aspirin 81 mg chewable tablet Chew one tablet by mouth daily with food.   ??? cetirizine (ZYRTEC) 10 mg tablet Take 10 mg by mouth every morning.   ??? clobetasol (TEMOVATE) 0.05 % topical cream Apply to affected area twice daily as needed   ??? diltiazem CD (CARDIZEM CD) 180 mg capsule Take one capsule by mouth daily.   ??? diphenhydrAMINE (BENADRYL ALLERGY) 25 mg tablet Take 25 mg by mouth at bedtime as needed.   ??? folic acid (FOLVITE) 1 mg tablet Take 1 mg by mouth daily.   ???  GLUCOSAMINE HCL/CHONDROITIN SU (GLUCOSAMINE-CHONDROITIN PO) Take 1 tablet by mouth twice daily.   ??? insulin aspart U-100 (NOVOLOG) 100 unit/mL injection Inject four Units under the skin three times daily with meals.   ??? insulin NPH (HUMULIN N NPH U-100 INSULIN) 100 unit/mL injection Inject fourteen Units under the skin every morning.   ??? Insulin Syringe-Needle U-100 (BD INSULIN SYRINGE ULTRA-FINE) 0.3 mL 31 gauge x 5/16 syrg Use four times daily with Insulin   ??? levothyroxine (SYNTHROID) 175 mcg tablet Take 175 mcg by mouth daily 30 minutes before breakfast.   ??? magnesium oxide (MAG-OX) 400 mg (241.3 mg magnesium) tablet Take one tablet by mouth twice daily.   ??? metoprolol tartrate (LOPRESSOR) 100 mg tablet Take 100 mg by mouth twice daily.   ??? nitroglycerin (NITROSTAT) 0.4 mg tablet Place 0.4 mg under tongue every 5 minutes as needed for Chest Pain. Max of 3 tablets, call 911.   ??? omeprazole DR(+) (PRILOSEC) 40 mg capsule Take 40 mg by mouth twice daily.   ??? ondansetron (ZOFRAN) 8 mg tablet Take 8 mg by mouth every 8 hours as needed for Nausea or Vomiting. ??? potassium chloride SR (K-DUR) 20 mEq tablet Take one tablet by mouth daily. Take with a meal and a full glass of water.   ??? pramoxine/zinc oxide (TRONOLANE) 1% / 5% topical cream Insert or Apply  to rectal area as directed daily as needed (For Hemorrhoids).   ??? senna/docusate (SENOKOT-S) 8.6/50 mg tablet Take two tablets by mouth twice daily. (Patient taking differently: Take 2 tablets by mouth at bedtime daily.)   ??? tiZANidine (ZANAFLEX) 4 mg tablet Take 4 mg by mouth every 6 hours as needed.   ??? torsemide(+) (DEMADEX) 20 mg tablet Take two tablets by mouth daily.   ??? traMADol (ULTRAM) 50 mg tablet Take one tablet by mouth every 6 hours as needed for Pain.     Review of Systems:  Review of systems not obtained from patient due to patient factors.      Vital Signs: Last Filed In 24 Hours                Vital Signs: 24 Hour Range   BP: 153/72 (12/18 1900)  Temp: 36.5 ???C (97.7 ???F) (12/18 1900)  Pulse: 106 (12/18 1900)  Respirations: 8 PER MINUTE (12/18 1900)  SpO2: 94 % (12/18 1900)  SpO2 Pulse: 107 (12/18 1900)  Height: 162.6 cm (64) (12/18 1827)  BP: (153)/(72)   ABP: (114)/(62)   Temp:  [36.5 ???C (97.7 ???F)]   Pulse:  [106]   Respirations:  [8 PER MINUTE]   SpO2:  [94 %-99 %]           Physical Exam:   Gen: Sedated, morbidly obese age appearing female  Eyes: PERRLA, no icterus  Neck: Supple, No JVP appreciated  Chest: mechanical BS, decreased BS L base  Heart: Irregular, tachy, No murmurs appreciated  Abd: Obese, distended, NBS, mild fluid wave  Extremities: pulses 2+ Bilat LE, Diffuse 4+ pitting edema to thighs bilat  Neuro: Sedated,   Skin: no lesions, bruising, petechia  Lines: Femoral Arterial line, R IJ, R chest wall PIV.    Ventilator/ Respiratory Support:  Yes: Weaning readiness screen (RT Only):: Implement protocol  Weaning readiness screen Met (RT Only):: Other (see comments)  Mode: P/AC  Expired Tidal Volume Spont (mL):  [490 milliliters-501 milliliters]   Set RR:  [16 breaths/minutes] Total Respiratory Rate (Breaths/Min):  [16 breaths/minutes-18 breaths/minutes]   Minute Volume (L/min):  [  7.51 liters/minutes-8.12 liters/minutes]   %MVspon:  [0 %]   O2%:  [50 %]   PIP (set):  [30 cm H20]   PIP Actual:  [30 cm H20-31 cm H20]   PEEP/CPAP:  [8 cm H2O]   Delta P/PControl (PIP-PEEP)(calc):  [22 cm H2O]      Lab:  24-hour labs:    Results for orders placed or performed during the hospital encounter of 05/20/17 (from the past 24 hour(s))   CBC AND DIFF    Collection Time: 05/20/17  6:31 PM   Result Value Ref Range    White Blood Cells 5.8 4.5 - 11.0 K/UL    RBC 2.77 (L) 4.0 - 5.0 M/UL    Hemoglobin 8.8 (L) 12.0 - 15.0 GM/DL    Hematocrit 16.1 (L) 36 - 45 %    MCV 93.7 80 - 100 FL    MCH 31.7 26 - 34 PG    MCHC 33.8 32.0 - 36.0 G/DL    RDW 09.6 (H) 11 - 15 %    Platelet Count 157 150 - 400 K/UL    MPV 8.2 7 - 11 FL    Neutrophils 92 (H) 41 - 77 %    Lymphocytes 6 (L) 24 - 44 %    Monocytes 2 (L) 4 - 12 %    Eosinophils 0 0 - 5 %    Basophils 0 0 - 2 %    Absolute Neutrophil Count 5.30 1.8 - 7.0 K/UL    Absolute Lymph Count 0.30 (L) 1.0 - 4.8 K/UL    Absolute Monocyte Count 0.10 0 - 0.80 K/UL    Absolute Eosinophil Count 0.00 0 - 0.45 K/UL    Absolute Basophil Count 0.00 0 - 0.20 K/UL   COMPREHENSIVE METABOLIC PANEL    Collection Time: 05/20/17  6:31 PM   Result Value Ref Range    Sodium 140 137 - 147 MMOL/L    Potassium 3.8 3.5 - 5.1 MMOL/L    Chloride 103 98 - 110 MMOL/L    Glucose 303 (H) 70 - 100 MG/DL    Blood Urea Nitrogen 38 (H) 7 - 25 MG/DL    Creatinine 0.45 (H) 0.4 - 1.00 MG/DL    Calcium 9.0 8.5 - 40.9 MG/DL    Total Protein 6.5 6.0 - 8.0 G/DL    Total Bilirubin 1.0 0.3 - 1.2 MG/DL    Albumin 3.0 (L) 3.5 - 5.0 G/DL    Alk Phosphatase 51 25 - 110 U/L    AST (SGOT) 14 7 - 40 U/L    CO2 26 21 - 30 MMOL/L    ALT (SGPT) 6 (L) 7 - 56 U/L    Anion Gap 11 3 - 12    eGFR Non African American 28 (L) >60 mL/min    eGFR African American 34 (L) >60 mL/min   MAGNESIUM    Collection Time: 05/20/17  6:31 PM Result Value Ref Range    Magnesium 2.0 1.6 - 2.6 mg/dL   PHOSPHORUS    Collection Time: 05/20/17  6:31 PM   Result Value Ref Range    Phosphorus 3.6 2.0 - 4.5 MG/DL   BNP (B-TYPE NATRIURETIC PEPTI)    Collection Time: 05/20/17  6:31 PM   Result Value Ref Range    B Type Natriuretic Peptide 247.0 (H) 0 - 100 PG/ML   TROPONIN-I    Collection Time: 05/20/17  6:31 PM   Result Value Ref Range    Troponin-I 0.02 0.0 - 0.05 NG/ML  TSH WITH FREE T4 REFLEX    Collection Time: 05/20/17  6:31 PM   Result Value Ref Range    TSH 2.850 0.35 - 5.00 MCU/ML   BLOOD GASES, ARTERIAL    Collection Time: 05/20/17  6:35 PM   Result Value Ref Range    pH-Arterial 7.40 7.35 - 7.45    pCO2-Arterial 46 (H) 35 - 45 MMHG    pO2-Arterial 118 (H) 80 - 100 MMHG    Base Excess-Arterial 3.2 MMOL/L    O2 Sat-Arterial 98.8 95 - 99 %    Bicarbonate-ART-Cal 27.3 21 - 28 MMOL/L   LACTIC ACID (BG - RAPID LACTATE)    Collection Time: 05/20/17  6:35 PM   Result Value Ref Range    Lactic Acid,BG 1.3 0.5 - 2.0 MMOL/L   BLOOD BANK SAMPLE HOLD    Collection Time: 05/20/17  6:35 PM   Result Value Ref Range    BB Sample hold IN LAB    POC GLUCOSE    Collection Time: 05/20/17  6:44 PM   Result Value Ref Range    Glucose, POC 321 (H) 70 - 100 MG/DL   O2 SATURATION, CENTRAL VENOUS    Collection Time: 05/20/17  7:00 PM   Result Value Ref Range    O2 Sat, Central Venous 83.6 %   BLOOD GASES, ARTERIAL    Collection Time: 05/20/17  8:00 PM   Result Value Ref Range    pH-Arterial 7.46 (H) 7.35 - 7.45    pCO2-Arterial 39 35 - 45 MMHG    pO2-Arterial 70 (L) 80 - 100 MMHG    Base Excess-Arterial 3.9 MMOL/L    O2 Sat-Arterial 95.0 95 - 99 %    Bicarbonate-ART-Cal 27.9 21 - 28 MMOL/L     POC Glucose (Download): (!) 321 (05/20/17 1844)    Radiology and Other Diagnostic Procedures Review:    Pertinent radiology reviewed.                                                                                       Karen Chafe, DO  Pager [phone]

## 2017-05-21 NOTE — Progress Notes
Heart Failure Nursing Progress Note    Admission Date: 05/20/2017  LOS: 1 day    Admission Weight: 132 kg (291 lb 0.1 oz)        Most recent weights (inpatient):   Vitals:    05/20/17 1827 05/21/17 0300   Weight: 132 kg (291 lb 0.1 oz) 131.3 kg (289 lb 7.4 oz)     Weight change from previous day: 0.7 kg    Fluid restriction ordered: n/a    Intake/Output Summary: (Last 24 hours)    Intake/Output Summary (Last 24 hours) at 05/21/2017 4259  Last data filed at 05/21/2017 5638  Gross per 24 hour   Intake 934 ml   Output 980 ml   Net -46 ml       Is patient incontinent No     Anticipated discharge date: 06/01/17  Discharge goals: decreased O2 needs      Daily Assessment of Patient Stated Goals:    Short Term Goal Identified by patient (Short Term=during hospitalization):  Anxiety controlled

## 2017-05-22 LAB — POC GLUCOSE
Lab: 140 mg/dL — ABNORMAL HIGH (ref 70–100)
Lab: 175 mg/dL — ABNORMAL HIGH (ref 70–100)
Lab: 181 mg/dL — ABNORMAL HIGH (ref 70–100)

## 2017-05-22 LAB — COMPREHENSIVE METABOLIC PANEL: Lab: 143 MMOL/L — ABNORMAL HIGH (ref >60)

## 2017-05-22 LAB — CULTURE-RESP,LOWER W/SENSITIVITY: Lab: LOW

## 2017-05-22 LAB — PROTIME INR (PT): Lab: 1.2 MMOL/L — ABNORMAL LOW (ref >60)

## 2017-05-22 LAB — MAGNESIUM: Lab: 2 mg/dL — ABNORMAL LOW (ref 1.6–2.6)

## 2017-05-22 LAB — VANCOMYCIN TIMED LEVEL: Lab: 17 ug/mL — ABNORMAL LOW (ref 3.5–5.1)

## 2017-05-22 LAB — CBC AND DIFF: Lab: 9.6 10*3/uL — ABNORMAL LOW (ref >60)

## 2017-05-22 LAB — PHOSPHORUS: Lab: 3.2 mg/dL — ABNORMAL LOW (ref 2.0–4.5)

## 2017-05-22 MED ORDER — ALPRAZOLAM 0.25 MG PO TAB
0.25 mg | Freq: Once | ORAL | 0 refills | Status: CP
Start: 2017-05-22 — End: ?
  Administered 2017-05-23: 05:00:00 0.25 mg via ORAL

## 2017-05-22 MED ORDER — POTASSIUM CHLORIDE 20 MEQ/15 ML PO LIQD
20 meq | Freq: Every day | ORAL | 0 refills | Status: DC
Start: 2017-05-22 — End: 2017-05-25
  Administered 2017-05-23 – 2017-05-24 (×2): 20 meq via ORAL

## 2017-05-22 MED ORDER — CEFTRIAXONE INJ 1GM IVP
1 g | INTRAVENOUS | 0 refills | Status: CP
Start: 2017-05-22 — End: ?
  Administered 2017-05-22 – 2017-05-25 (×4): 1 g via INTRAVENOUS

## 2017-05-22 MED ORDER — MULTIVITAMIN WITH IRON-MINERAL PO LIQD
15 mL | Freq: Every day | NASOGASTRIC | 0 refills | Status: DC
Start: 2017-05-22 — End: 2017-05-25
  Administered 2017-05-23 (×2): 15 mL via NASOGASTRIC

## 2017-05-22 MED ORDER — FUROSEMIDE 10 MG/ML PO SOLN
40 mg | Freq: Every day | ORAL | 0 refills | Status: DC
Start: 2017-05-22 — End: 2017-05-25
  Administered 2017-05-23 – 2017-05-24 (×2): 40 mg via ORAL

## 2017-05-22 MED ORDER — DILTIAZEM(#) 10MG/ML PO SOLN
30 mg | ORAL | 0 refills | Status: DC
Start: 2017-05-22 — End: 2017-05-24
  Administered 2017-05-22 – 2017-05-24 (×6): 30 mg via ORAL

## 2017-05-22 MED ORDER — INSULIN ASPART 100 UNIT/ML SC FLEXPEN
0-7 [IU] | Freq: Before meals | SUBCUTANEOUS | 0 refills | Status: DC
Start: 2017-05-22 — End: 2017-06-06

## 2017-05-22 MED ORDER — METOPROLOL(#) 10MG/ML PO SOLN
50 mg | Freq: Two times a day (BID) | ORAL | 0 refills | Status: DC
Start: 2017-05-22 — End: 2017-05-25
  Administered 2017-05-23 – 2017-05-24 (×4): 50 mg via ORAL

## 2017-05-22 MED ORDER — FOLIC ACID(#) 100MCG/ML PO SOLN
1 mg | Freq: Every day | ORAL | 0 refills | Status: DC
Start: 2017-05-22 — End: 2017-05-25
  Administered 2017-05-23 – 2017-05-24 (×2): 1 mg via ORAL

## 2017-05-22 MED ORDER — NPH INSULIN HUMAN RECOMB 100 UNIT/ML (3 ML) SC PEN
14 [IU] | Freq: Every day | SUBCUTANEOUS | 0 refills | Status: DC
Start: 2017-05-22 — End: 2017-06-06
  Administered 2017-05-22: 15:00:00 14 [IU] via SUBCUTANEOUS

## 2017-05-22 MED ORDER — VANCOMYCIN 1,250 MG IVPB
1250 mg | Freq: Once | INTRAVENOUS | 0 refills | Status: CP
Start: 2017-05-22 — End: ?
  Administered 2017-05-22 (×2): 1250 mg via INTRAVENOUS

## 2017-05-22 NOTE — Progress Notes
SPEECH-LANGUAGE PATHOLOGY  DAILY TREATMENT NOTE      Patient seen 1x this date.  Documentation reflects all daily treatment sessions.    SUMMARY OF THERAPY SESSION: Attempted to see pt x1 prior to session this am, however pt with bathroom conflicts. Dysphagia treatment completed this date. Moderate oropharyngeal dysphagia present at this time. Overt coughing episode noted following completion of 3 oz Water Protocol. Delayed cough x1 each noted with mech soft solids and nectar thick liquids. Anticipate pt remains at risk for aspiration. Vocal quality improved, remains minimally hoarse this date. Please see further details below.     RECOMMENDATIONS:  Remain NPO at this time. Continue Corpak for nutrition/medication/hydration.  Initiate ice chip protocol (7-10 per hour). Guidelines provided to RN.   Frequent oral care to minimize risk for aspiration of bacteria in secretions.  Ongoing dysphagia assessment and treatment.    Goal : Pt will participate in ongoing dysphagia assesment and treatment given mild-mod cues.   Met  Comment: PO trials of ice chips, thin and nectar thick liquids via tsp/cup/straw, thin liquids via 3 oz Water Protocol, pureed solids, and mech soft solids provided this date. Pt able to withdraw boluses with some assistance feeding. Formation and AP transfer appeared [facility]. Mastication of mech soft solids appeared slightly slowed, anticipate due to missing dentition without upper dentures. No anterior bolus spillage nor oral residue noted. Swallow initiation appeared minimally delayed. Laryngeal elevation slightly reduced as judged via laryngeal palpation. Overt coughing episode noted following completion of 3 oz Water Protocol, starting/stopping also noted during completion of 3 oz. Delayed cough x1 each noted with mech soft solids and nectar thick liquids. Multiple swallows (2-3 per bolus) noted intermittently across consistencies indicating pharyngeal residue and/or laryngeal penetration. Continue to address this goal    New Goal : Pt will tolerate ice chip protocol with less than 5% s/s of aspiration and without negative pulmonary status changes.    PLAN / RECOMMENDATIONS:  Continue treatment 3-5x/week    Therapist: Rozell Searing, MHS, L/CCC-SLP Voalte: [phone]  Date: 05/22/2017

## 2017-05-22 NOTE — Consults
CLINICAL NUTRITION                                                        Clinical Nutrition Assessment Summary     NAME:Diane Savage             MRN: [id number]             DOB:12-13-1955          AGE: 61 y.o.  ADMISSION DATE: 05/20/2017             DAYS ADMITTED: LOS: 2 days    Nutrition Assessment of Patient:  BMI Categories Adult: Obesity Class III: 40 and over (47.23 current wt)  Unintentional Weight Loss: none (remains above usual body weight range)  Malnutrition Assessment: Does not meet criteria  Estimated Calorie Needs: 1450-1580kcal (22-24kcal/kg desired wt)  Estimated Protein Needs: 99-112g (1.5-1.7g/kg desired wt)  Oral Diet Order: NPO  Current EN Order: Isosource 1.5 @ 4ml/hr continuous via corpak. Provides 1080kcal, 49g protein, free H2O (assuming total daily holds of 6hrs for meds & not currently on volume-based protocol).     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 Admit c/b AMS/respiratory failure requiring intubation, afib w/ RVR & septic shock from HCAP + UTI.???Has since been extubated (12/19 AM), shock resolved. Diuresis resumed. SLP following-has not yet been cleared for PO diet. EN orders placed 12/19- Isosource 1.5 started ~18:00 (76ml/hr), reaching ordered goal rate 47ml/hr by ~04:00. Infusing at goal at time of visit this afternoon. Patient awake & c/o thirst. Reports her appetite has generally been low and poor for weeks prior to acute events. Claims to only have had jello most days and reports usual routine of chewing on ice throughout the day (2/2 IDA). Admits to not following any diet guidelines or self-monitoring fluid intakes, etc. Also admits to not weighing self at home 2/2 frequent doctor visits/weights there. Denies any GI distress though does tend toward constipation; +BMs currently with bowel meds & on EN/abx. Endorses usual weight range closer to 240-250# (admit wt listed at 291# and most recently 275# with diuresis). Is on 3L NC. No pressure injuries noted. Of note, in speaking with RN, patient was found with potato chips and soda & patient admitted to eating; RN note with details. Will likely benefit from ongoing diet rationale/education for reinforcement once PO nutrition resumes.      Recommendation:  ??? Agree to continue Isosource 1.5. To better meet estimated needs, modify goal rate to 18ml/hr. Add volume-based EN protocol: 24hr volume goal: , 4hr goal . Add Prosource 3 packs/day. Will provide 1440kcal, 102g protein, free H2O. H2O boluses if team desires.  ??? If cleared for PO, goal diet order: Cardiac, Consistent Carbohydrate (60g/meal), textures per SLP & fluid restriction within diet order (2L or volume per team).                                Intervention / Plan:  Will order MVI with minerals to better meet micronutrient needs  Will monitor  EN vs PO intakes for tolerance, adequacy, appropriateness & will adjust nutrition recs again prn       Nutrition Diagnosis:  Swallowing difficulty  Etiology: recent intubation  Signs & Symptoms: SLP eval & need for EN support     Goals:  EN tolerated and meeting >85% of nutritional needs  Time Frame: Within 48 Hours     Transition from enteral to oral  Time Frame: Within 5 Days     Jeri Modena, RD, LD, CNSC [phone]

## 2017-05-22 NOTE — Progress Notes
PHYSICAL THERAPY  ASSESSMENT     MOBILITY:  Mobility  Progressive Mobility Level: Stand  Level of Assistance: Assist X2  Assistive Device: Walker  Time Tolerated: 0-10 minutes  Activity Limited By: Weakness    SUBJECTIVE:  Subjective  Significant hospital events: PMH significant for CAD s/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 She was transferred from [facility] on 12/18 after presenting there on 12/16 for syncope/fatigue. Admit c/b AMS/respiratory failure requiring intubation, afib w/ RVR & septic shock from HCAP + UTI. On arrival, NE was able to be stopped & she was extubated 12/19 AM.  Mental / Cognitive Status: Alert;Cooperative;Follows Commands;Confused  Persons Present: Nursing Staff;Physical Therapist;Spouse  Pain: Patient complains of pain;Patient does not rate pain  Pain Location: (Rectal tube)  Comments: 3L 02 NC  Ambulation Assist: Independent Mobility at Household Level with Device  Patient Owned Equipment: Nurse, adult  Home Situation: Lives with Family  Type of Home: House  Entry Stairs: Ramp  In-Home Stairs: No Stairs  Comments: Reports being able to get around house but with SOA. Was lately just put on 2L 02 NC at home a week ago.  Fell off toilet in bathroom.  Transfer [facility]    ROM:  ROM  LE ROM: Bilateral;Hip;Knee;Ankle;WFL    STRENGTH:  Strength  Overall Strength: Generalized Weakness;No Focal Deficits Noted  Gross Strength Grade: 3-/5    POSTURE/NEURO:  Posture / Neurological  Posture: Obesity    BED MOBILITY/TRANSFERS:  Bed Mobility/Transfers  Bed Mobility: Supine to Sit: Maximum Assist;x2 People;Head of Bed Elevated;Assist with Trunk;Assist with B LE  Bed Mobility: Sit to Supine: Maximum Assist;x2 People;Assist with Trunk;Assist with B LE  Transfer Type: Sit to Stand Transfer: Assistance Level: From;Bed;Moderate Assist;x2 People  Transfer: Assistive Device: Nurse, adult  Transfers: Type Of Assistance: Materials engineer;For Strength Deficit;For Safety Considerations  End Of Activity Status: In Bed;Nursing Notified;Instructed Patient to Request Assist with Mobility;Instructed Patient to Use Call Light    BALANCE:  Balance  Sitting Balance: Static Sitting Balance;2 UE Support;Minimal Assist  Standing Balance: Static Standing Balance;2 UE support;Minimal Assist;x2 People    ACTIVITY/EXERCISE:  Activity / Exercise  Sit Edge Of Bed: 15 minutes  Sit Edge Of Bed Assist: Minimal Assist  Stand At Bedside : (20 seconds)    EDUCATION:  Education  Persons Educated: Patient  Patient Barriers To Learning: Confusion  Interventions: Repetition of Instructions  Teaching Methods: Verbal Instruction  Patient Response: Verbalized Understanding  Topics: Plan/Goals of PT Interventions;Use of Assistive Device/Orthosis;Mobility Progression;Up with Assist Only;Importance of Increasing Activity;Positioning;Recommend Continued Therapy;Therapy Schedule    ASSESSMENT/PROGRESS:  Assessment/Progress  Impaired Mobility Due To: Decreased Strength;Decreased Activity Tolerance;Deconditioning;Medical Status Limitation  Assessment/Progress: Should Improve w/ Continued PT  AM-PAC 6 Clicks Basic Mobility Inpatient  Turning from your back to your side while in a flat bed without using bed rails: A lot  Moving from lying on your back to sitting on the side of a flatbed without using bedrails : A Lot  Moving to and from a bed to a chair (including a wheelchair): A Lot  Standing up from a chair using your arms (e.g. wheelchair, or bedside chair): A Lot  To walk in hospital room: Total  Climbing 3-5 steps with a railing: Total  Raw Score: 10  Standardized (T-scale) Score: 28.13  Basic Mobility  CMS 0-100%: 71.92  CMS G Code Modifier for Basic Mobility: CL    GOALS:  Goals  Goal Formulation: With Patient Time For Goal Achievement: 5 days  Pt Will Go Supine To/From Sit: w/ Moderate Assist  Pt Will Transfer Bed/Chair: w/ Moderate Assist  Pt Will Transfer Sit to Stand: w/ Moderate Assist    PLAN:  Plan   Treatment Interventions: Mobility Training;Strengthening  Plan Frequency: 5 Days per Week  PT Plan for Next Visit: Bed mobility; standing; pre-gait in prep for transfers to chair    RECOMMENDATIONS:  PT Discharge Recommendations  PT Discharge Recommendations: Inpatient Setting    Therapist: Ernestine Mcmurray, PT  Date: 05/22/2017

## 2017-05-22 NOTE — Progress Notes
Pulmonary / Critical Care Progress Note    Diane Savage  Today's Date:  05/22/2017  Admission Date: 05/20/2017  LOS: 2 days    Active Problems:    Type 2 diabetes mellitus with circulatory disorder, without long-term current use of insulin (HCC)    PAF (paroxysmal atrial fibrillation) (HCC)    Atrial fibrillation with rapid ventricular response (HCC)    Chronic gastrointestinal bleeding    HLD (hyperlipidemia)    Essential hypertension    Acute on chronic diastolic heart failure due to coronary artery disease (HCC)    Sepsis (HCC)    Morbid obesity (HCC)    Acute hypoxemic respiratory failure [facility])    Brief Hospital Course:      Diane Savage is a 61 y/o female with PMH significant for CAD s/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 She was transferred from [facility] on 12/18 after presenting there on 12/16 for syncope/fatigue. Admit c/b AMS/respiratory failure requiring intubation, afib w/ RVR & septic shock from HCAP + UTI. On arrival, NE was able to be stopped & she was extubated 12/19 AM. W/ hx of HF & no longer in shock have cautiously resumed diuresis. Afib has been rate controlled here, dc'd amiodarone & resumed metoprolol/cardizem at lower than PTA doses. Antbx narrowed to ceftriaxone and doxycycline for PNA & UTI.     Assessment/Plan:      NEURO  Acute Encephalopathy (improved)  - developed AMS the day of admit to OSH in the setting of septic shock  - received 15 mg versed on 12/18  - CT head (OSH): no acute process  - ammonia level 95 (12/19)  - on exam is lethargic but appropriately following commands  PLAN  - hold PTA tizanidine and tramadol    PULM  Acute Hypercarbic & Hypoxemic Respiratory Failure (improved)  L Pleural Effusion  - d/t HCAP - CT chest (OSH): circumferential L pleural effusion & densely consolidated L base w/ air bronchograms  - extubated 12/19  - currently on 3 L/NC, O2 sat 93%  PLAN  - diuresis as below  - have requested images from OSH    CV  Chronic Combined HF  CAD, HTN  - 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  - echo 10/18: LVEF 55-60%, dilated RV with hypocontractility, severe RA enlargement, moderate TR, moderate LA enlargement, + intra-atrial PFO with bidirectional shunt, s/p left atrial appendage ligation  - repeat echo 05/21/17: LVEF 50-55%, no regional wall motion abnormalities; RV mildly dilated but function probably normal; mild to moderate MR; mild to moderate TR; peak PAP 41 mmHg; no pericardial effusion  - EKG: no ST changes  - troponin negative x2  - BNP 247 (prior 1,178)  - grossly overloaded on exam & w/ pleural effusion  - lasix 40 mg IV x 1 on 12/19 --> net - 1 L last 24 hrs  PLAN  - resume PTA ASA & lasix  Septic Shock (resolved)  - arrived to [facility] on pressor, but essentially stopped immediately  -???central venous O2 sat 84%  - SBP 110s-140s  Chronic Afib w/ RVR s/p Atrial Appendage Ligation  - no PTA AC d/t recurrent GIBs  - received amiodarone load at OSH & continued on a drip  - HR 90s-100s; nursing staff documenting SR, but difficult to tell if there are  P-waves  PLAN  - dc'd amiodarone 12/19  - resumed PTA metoprolol at 50 mg BID (PTA dose is 100 mg BID)  - resumed PTA diltiazem at 30 mg Q 6 hrs (PTA dose is CD 180 mg daily)  - obtain EKG    GI  GAVE  Transfusion-Dependent Anemia  - follows with [facility]  - EGD 10/15: 3 columns of small EV, portal HTN gastropathy & mild GAVE  - hgb 9.0 (at baseline)  - plt 184, INR 1.2  PLAN  - continue PTA PPI  Liver Disease  - c/b portal HTN, congestive colopathy, EV, GAVE & ascites  - limited prior w/u; was to see hepatology in clinic in January  - [place] 02/2017: diffuse hepatic steatosis  - EGD as above  - MELD-Na 14    RENAL  AKI on CKD (improving)  - baseline Cr 1.3 - Cr 2.4 on arrival at OSH --> 1.72 this AM  - I/O: net - 1 L last 24 hrs w/ 1.8 L UOP     ENDO  DM2  - PTA regimen: NPH 14 units Q AM, novolog 4 units TID  - BS 140s-240s last 24 hrs  PLAN  - resume NPH  - decrease to low dose correction factor  Hypothyroidism  - TSH 1.99 (12/18)  PLAN  - continue PTA synthroid    ID  Septic Shock  HCAP  UTI  - WBC 9.6; afebrile  - hx resistant e.coli UTI  - per OSH micro tech, no blood cxs were drawn  - urine cx (OSH) 12/14: + e.coli (resitant to cefazolin, ampicillin, levaquin, augmentin, cipro; sensitive to  merrem, ceftriaxone & bactrim)  - BAL 12/17 (OSH): usual upper respiratory flora, candida species  - CT chest (OSH): dense L base consolidation  - blood cx 12/18: NGTD  - sputum cx 12/18: GS w/o organisms; cx NGTD  - UA 12/18: 1+ leuks, + nitrites, 20-50 WBC, few bacteria  - urine cx 12/18: pending  - strep pneumo and legionella urine Ags negative  - procalcitonin 0.22 (12/18)  - no suitable pocket of ascites (on bedside US) for a paracentesis   PLAN  - dc vancomycin  - change cefepime to ceftriaxone  - continue doxycycline     FEN  - NPO  - tube feedings: Isosource 1.5 at 40 mL/hr  - IVF: none  - review electrolytes and replace as needed    PPX:  Lines: PIV, R IJ TLC  Drains/Tubes: Corpak  Indwelling Urinary Catheter: No  DVT: SCDs; Heparin  GI: Protonix  PT/OT: Yes  Insulin: Yes  Reviewed Quality Checklist with RN: Yes    Code Status: Full Code  Disposition/Family: Stable for transfer to floor.    Scheryl Darter, APRN  Pulm/Critical Care  Pager [phone]  M2 (2nd call/night) Pager [phone]  05/22/2017 1401  __________________________________________________________________________________    Subjective:     Diane Savage is a 61 y.o. female who is awake and alert, resting in bed. States she did not sleep well last night; c/o back pain. Frequent BMs overnight and this AM.     ROS: Denies HA, chest pain/pressure, SOB, cough, abdominal pain, N/V, or rash.    Objective: Medications:  Scheduled Meds:  aspirin chewable tablet 81 mg 81 mg Oral QDAY   cefepime (MAXIPIME) 2 g/100 ml iso-osmotic IVPB 2 g Intravenous Q12H*   diltiazem (cardIZEM) tablet 30 mg 30 mg Oral Q6H   doxycycline (VIBRAMYCIN) tablet 100 mg 100 mg Oral BID  folic acid (FOLVITE) tablet 1 mg 1 mg Oral QDAY   furosemide (LASIX) tablet 40 mg 40 mg Oral QDAY   heparin (porcine) PF syringe 5,000 Units 5,000 Units Subcutaneous Q8H   insulin aspart U-100 (NOVOLOG FLEXPEN) injection PEN 0-14 Units 0-14 Units Subcutaneous ACHS   lactulose oral solution 20 g 30 mL Oral TID   levothyroxine (SYNTHROID) tablet 175 mcg 175 mcg Oral QDAY(07)   magnesium oxide (MAG-OX) tablet 400 mg 400 mg Oral BID   metoprolol tartrate (LOPRESSOR) tablet 50 mg 50 mg Oral BID   pantoprazole(#) (PROTONIX) suspension 40 mg 40 mg Oral BID(11-21)   potassium chloride SR (K-DUR) tablet 20 mEq 20 mEq Oral QDAY   vancomycin, random dosing 1 each Intravenous Random Dosing   Continuous Infusions:  PRN and Respiratory Meds:pancrelipase 20,000 Units/ sodium bicarbonate 650 mg(#) PRN (On Call from Rx), [DISCONTINUED] vancomycin   IVPB ONCE **AND** vancomycin, pharmacy to manage Per Pharmacy                       Vital Signs: Last Filed                  Vital Signs: 24 Hour Range   BP: 115/49 (12/20 0500)  Temp: 36.7 ???C (98.1 ???F) (12/20 0000)  Pulse: 87 (12/20 0500)  Respirations: 27 PER MINUTE (12/20 0500)  SpO2: 93 % (12/20 0500)  O2 Delivery: Nasal Cannula (12/20 0500)  SpO2 Pulse: 87 (12/20 0500)  Height: 162.6 cm (64) (12/19 0942) BP: (102-176)/(49-94)   ABP: (98-139)/(47-69)   Temp:  [36.7 ???C (98.1 ???F)-36.9 ???C (98.4 ???F)]   Pulse:  [85-118]   Respirations:  [0 PER MINUTE-27 PER MINUTE]   SpO2:  [91 %-97 %]   O2 Delivery: Nasal Cannula   Intensity Pain Scale (Self Report): 8 (05/22/17 0400) Vitals:    05/21/17 0300 05/21/17 0942 05/22/17 0200   Weight: 131.3 kg (289 lb 7.4 oz) 131.3 kg (289 lb 7.4 oz) 124.8 kg (275 lb 2.2 oz) Intake/Output Summary:  (Last 24 hours)    Intake/Output Summary (Last 24 hours) at 05/22/2017 0604  Last data filed at 05/22/2017 0400  Gross per 24 hour   Intake 676.88 ml   Output 1805 ml   Net -1128.12 ml         Physical Exam:      General: awake and alert, no distress, appears stated age  Head: normocephalic, without obvious abnormality, atraumatic  Eyes: conjunctivae/corneas clear  Mouth/throat: moist mucous membranes; corpak in place  Neck: supple, symmetrical, trachea midline  Lungs: clear to auscultation bilaterally, diminished in bases; no wheeze  Heart: regular rate and rhythm, no murmur appreciated  Abdomen: obese, soft, non-tender; bowel sounds active  Extremities: normal, atraumatic, no cyanosis, 1+ BUE & 3+ BLE edema  Pulses: 2+ and symmetric, all extremities  Skin: no rashes or lesions noted  Neurologic: grossly normal    Artificial airway:  None                                                                                         Ventilator/ Respiratory Therapy:  No  Vent weaning trial:  Not applicable      Laboratory:  LABS:  Recent Labs      05/20/17   1831  05/20/17   2200  05/21/17   0340  05/22/17   0348   NA  140   --   139  143   K  3.8   --   3.5  3.2*   CL  103   --   103  105   CO2  26   --   27  30   GAP  11   --   9  8   BUN  38*   --   39*  39*   CR  1.84*   --   1.79*  1.72*   GLU  303*   --   302*  183*   CA  9.0   --   8.7  9.1   ALBUMIN  3.0*   --   2.6*  2.9*   MG  2.0   --   2.0  2.0   PO4  3.6   --   3.2  3.2   TSH  2.850  1.990   --    --        Recent Labs      05/20/17   1831  05/21/17   0340  05/22/17   0348   WBC  5.8  4.0*  9.6   HGB  8.8*  8.1*  9.0*   HCT  26.0*  24.2*  26.7*   PLTCT  157  113*  184   INR   --   1.3*  1.2   AST  14  10  15    ALT  6*  5*  6*   ALKPHOS  51  41  42   TNI  0.02  0.01   --       Estimated Creatinine Clearance: 45.4 mL/min (A) (based on SCr of 1.72 mg/dL (H)).  Vitals:    05/21/17 0300 05/21/17 0942 05/22/17 0200 Weight: 131.3 kg (289 lb 7.4 oz) 131.3 kg (289 lb 7.4 oz) 124.8 kg (275 lb 2.2 oz)    Recent Labs      05/21/17   0340  05/21/17   1457   PHART  7.43  7.37   PO2ART  101*  83                 Radiology and Other Diagnostic Procedures Review:    Reviewed pertinent studies.

## 2017-05-22 NOTE — Progress Notes
Pharmacy Vancomycin Note  Subjective:   Diane Savage is a 61 y.o. female being treated for empiric sepsis/PNA covg.    Objective:     Current Vancomycin Orders   Medication Dose Route Frequency    vancomycin (VANCOCIN) 1,250 mg in dextrose 5% (D5W) IVPB  1,250 mg Intravenous ONCE    vancomycin, pharmacy to manage  1 each Service Per Pharmacy    vancomycin, random dosing  1 each Intravenous Random Dosing     Start Date of  Vancomycin therapy: 05/20/2017    White Blood Cells   Date/Time Value Ref Range Status   05/22/2017 0348 9.6 4.5 - 11.0 K/UL Final   05/21/2017 0340 4.0 (L) 4.5 - 11.0 K/UL Final   05/20/2017 1831 5.8 4.5 - 11.0 K/UL Final     Creatinine   Date/Time Value Ref Range Status   05/22/2017 0348 1.72 (H) 0.4 - 1.00 MG/DL Final   05/21/2017 0340 1.79 (H) 0.4 - 1.00 MG/DL Final   05/20/2017 1831 1.84 (H) 0.4 - 1.00 MG/DL Final     Blood Urea Nitrogen   Date/Time Value Ref Range Status   05/22/2017 0348 39 (H) 7 - 25 MG/DL Final     Estimated CrCl: 45    Intake/Output Summary (Last 24 hours) at 05/22/2017 0854  Last data filed at 05/22/2017 0630  Gross per 24 hour   Intake 713.4 ml   Output 1785 ml   Net -1071.6 ml      Actual Weight:  124.8 kg (275 lb 2.2 oz)  Dosing BW:  130 kg   Drug Levels:  Vancomycin Random   Date/Time Value Ref Range Status   05/22/2017 0348 17.4 MCG/ML Final       Assessment:   Target levels for this patient: Re-dose ~15.  Evaluation of level(s): Level ~28 hrs post 1.75 gm dose.    Plan:   1. Cont vanco random dosing; will reduce next dose to 1.25 gm q24 based on level this AM and allow an additional 6 hours of clearance time (from AM lab) before redosing. 1.25 gm x1 today.   2. Next scheduled level(s): Tentative 24 hrs post dose.  3. Pharmacy will continue to monitor and adjust therapy as needed.    Donnie Coffin, [facility]  05/22/2017

## 2017-05-22 NOTE — Progress Notes
RT Adult Assessment Note    NAME:Diane Savage             MRN: [id number]             DOB:1956-03-14          AGE: 61 y.o.  ADMISSION DATE: 05/20/2017             DAYS ADMITTED: LOS: 1 day    RT Treatment Plan:       Protocol Plan: Procedures  Vibrating PEP Therapy: Q6h While Awake  PAP: Place a nursing order for "IS Q1h While Awake" for any of Lung Expansion indicators  Oxygen/Humidity: O2 to keep SpO2 > 92%  Monitoring: Pulse oximetry BID & PRN      Vital Signs:  Pulse: Pulse: 93  RR: Respirations: 22 PER MINUTE  SpO2: SpO2: 95 %  O2 Device: $$ O2 Device: Cannula  Liter Flow: O2 Liter Flow: 2 lpm  O2%:    Breath Sounds:    Respiratory Effort: Respiratory Effort: Non-Labored

## 2017-05-23 LAB — CULTURE-URINE W/SENSITIVITY: Lab: <10

## 2017-05-23 LAB — POC GLUCOSE
Lab: 152 mg/dL — ABNORMAL HIGH (ref 70–100)
Lab: 173 mg/dL — ABNORMAL HIGH (ref 70–100)
Lab: 179 mg/dL — ABNORMAL HIGH (ref 70–100)
Lab: 196 mg/dL — ABNORMAL HIGH (ref 70–100)
Lab: 197 mg/dL — ABNORMAL HIGH (ref 70–100)

## 2017-05-23 LAB — MAGNESIUM: Lab: 1.9 mg/dL (ref 1.6–2.6)

## 2017-05-23 LAB — COMPREHENSIVE METABOLIC PANEL: Lab: 144 MMOL/L — ABNORMAL HIGH (ref 137–147)

## 2017-05-23 LAB — VANCOMYCIN TROUGH: Lab: 17 ug/mL (ref 10.0–20.0)

## 2017-05-23 LAB — PHOSPHORUS: Lab: 3.3 mg/dL — ABNORMAL HIGH (ref 2.0–4.5)

## 2017-05-23 LAB — PROTIME INR (PT): Lab: 1.2 MMOL/L (ref 0.8–1.2)

## 2017-05-23 LAB — CBC AND DIFF: Lab: 6 10*3/uL — ABNORMAL LOW (ref >60)

## 2017-05-23 MED ORDER — TIZANIDINE 4 MG PO TAB
4 mg | Freq: Every evening | ORAL | 0 refills | Status: DC
Start: 2017-05-23 — End: 2017-06-06
  Administered 2017-05-24 – 2017-06-05 (×12): 4 mg via ORAL
  Administered 2017-06-06: 02:00:00 2 mg via ORAL

## 2017-05-23 MED ORDER — MAGNESIUM SULFATE IN D5W 1 GRAM/100 ML IV PGBK
1 g | Freq: Once | INTRAVENOUS | 0 refills | Status: CP
Start: 2017-05-23 — End: ?
  Administered 2017-05-23: 12:00:00 1 g via INTRAVENOUS

## 2017-05-23 MED ORDER — LACTATED RINGERS IV SOLP
500 mL | INTRAVENOUS | 0 refills | Status: CP
Start: 2017-05-23 — End: ?
  Administered 2017-05-24: 06:00:00 500 mL via INTRAVENOUS

## 2017-05-23 MED ORDER — CALCIUM GLUCONATE 1GM/100ML NS MB+
1 g | Freq: Once | INTRAVENOUS | 0 refills | Status: CP
Start: 2017-05-23 — End: ?
  Administered 2017-05-24 (×2): 1 g via INTRAVENOUS

## 2017-05-23 MED ORDER — POTASSIUM CHLORIDE 20 MEQ/15 ML PO LIQD
80 meq | Freq: Once | ORAL | 0 refills | Status: CP
Start: 2017-05-23 — End: ?
  Administered 2017-05-23: 13:00:00 80 meq via ORAL

## 2017-05-23 MED ORDER — TRAMADOL 50 MG PO TAB
50 mg | ORAL | 0 refills | Status: DC | PRN
Start: 2017-05-23 — End: 2017-06-06
  Administered 2017-05-24 – 2017-06-06 (×10): 50 mg via ORAL

## 2017-05-23 MED ORDER — DOPAMINE IN 5 % DEXTROSE 400 MG/250 ML (1,600 MCG/ML) IV SOLN
1-20 ug/kg/min | INTRAVENOUS | 0 refills | Status: DC
Start: 2017-05-23 — End: 2017-05-24
  Administered 2017-05-24: 06:00:00 3 ug/kg/min via INTRAVENOUS

## 2017-05-23 NOTE — Progress Notes
RT Adult Assessment Note    NAME:Delaine JUANICE WARBURTON             MRN: [id number]             DOB:1955-10-13          AGE: 61 y.o.  ADMISSION DATE: 05/20/2017             DAYS ADMITTED: LOS: 3 days    RT Treatment Plan:       Protocol Plan: Procedures  Vibrating PEP Therapy: Discontinued  IPPB: Place a nursing order for "IS Q1h While Awake" for any of Lung Expansion indicators  Oxygen/Humidity: O2 to keep SpO2 > 92%  Monitoring: Pulse oximetry BID & PRN    Additional Comments:  Impressions of the patient: stable      Vital Signs:  Pulse: Pulse: 84  RR: Respirations: 20 PER MINUTE  SpO2: SpO2: 96 %  O2 Device:  standby  Liter Flow:    O2%:  21  Breath Sounds:    Respiratory Effort:

## 2017-05-23 NOTE — Progress Notes
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 She was transferred from [facility] on 12/18 after presenting there on 12/16 for syncope/fatigue. Admit c/b AMS/respiratory failure requiring intubation, afib w/ RVR & septic shock from HCAP + UTI. On arrival, NE was able to be stopped & she was extubated 12/19 AM. W/ hx of HF & no longer in shock have cautiously resumed diuresis. Afib has been rate controlled here, dc'd amiodarone & resumed metoprolol/cardizem at lower than PTA doses. Antbx narrowed to ceftriaxone and doxycycline for PNA & UTI.     Assessment/Plan:      NEURO  Acute Encephalopathy (resolved)  - developed AMS the day of admit to OSH in the setting of septic shock  - CT head (OSH): no acute process  - ammonia level 95 (12/19)  - on exam, pt is alert and oriented  PLAN  - resume PTA tizanidine and tramadol  - dc lactulose    PULM  Acute Hypercarbic & Hypoxemic Respiratory Failure (improved)  L Pleural Effusion  - d/t HCAP   - CT chest (OSH): circumferential L pleural effusion & densely consolidated L base w/ air bronchograms  - extubated 12/19  - currently on 5 L/NC, O2 sat 98%  PLAN  - diuresis as below    CV  Chronic Combined HF  CAD, HTN  - 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  - echo 10/18: LVEF 55-60%, dilated RV with hypocontractility, severe RA enlargement, moderate TR, moderate LA enlargement, + intra-atrial PFO with bidirectional shunt, s/p left atrial appendage ligation  - repeat echo 05/21/17: LVEF 50-55%, no regional wall motion abnormalities; RV mildly dilated but function probably normal; mild to moderate MR; mild to moderate TR; peak PAP 41 mmHg; no pericardial effusion  - EKG: no ST changes  - troponin negative x 2  - BNP 247 (prior 1,178)  - grossly overloaded on exam & w/ pleural effusion  - lasix 40 mg IV x 1 on 12/19 --> net - 1 L   - PTA lasix 40 mg daily on 12/20 --> net - 1.3 L  PLAN  - continue PTA ASA & lasix  Septic Shock (resolved)  - arrived to [place] on pressor, but essentially stopped immediately  -???central venous O2 sat 84%  - SBP 100s-130s  Chronic Afib w/ RVR s/p Atrial Appendage Ligation; AFlutter  - no PTA AC d/t recurrent GIBs  - received amiodarone load at OSH & continued on a drip  - HR 90s; nurses state flipping back and forth between  aflutter and SR  - EKG 12/20 AM: aflutter, rate 79, QTc 445  - EKG 12/21 PM: aflutter, rate 81, QTc 421  PLAN  - dc'd amiodarone 12/19  - resumed PTA metoprolol at 50 mg BID (PTA dose is 100 mg BID)  - resumed PTA diltiazem at 30 mg Q 6 hrs (PTA dose is CD 180 mg daily)     GI  GAVE  Transfusion-Dependent Anemia  - follows with [facility]  - EGD 10/15: 3 columns of small EV, portal HTN gastropathy & mild GAVE  - hgb 8.5 (at baseline)  - plt 138  PLAN  - continue PTA PPI  Liver Disease  - c/b portal HTN, congestive colopathy, EV, GAVE & ascites  - limited prior w/u; was to see hepatology in clinic in January  - [place] 02/2017: diffuse hepatic steatosis  - EGD as above  - MELD-Na 12  Dysphagia  - moderate oropharyngeal dysphagia per SLP - corpak in place w/ TFs  - ice chip protocol; Dr. Cena Benton agreeable to pt having sips Sprite Zero  - nursing personnel found potato chips/soda in pt's bed on 12/20 -- pt admitted to eating chips    RENAL  AKI on CKD (improving)  - baseline Cr 1.3  - Cr 2.4 on arrival at OSH --> 1.54 this AM  - I/O: net - 1.3 L last 24 hrs w/ 2 L UOP (net - 2.5 L for hospitalization)    ENDO  DM2  - PTA regimen: NPH 14 units Q AM, novolog 4 units TID  - BS 150s-180s last 24 hrs  PLAN  - continue NPH + low dose correction factor  Hypothyroidism  - TSH 1.99 (12/18)  PLAN  - continue PTA synthroid    ID  Septic Shock  HCAP  UTI  - WBC 6.0; afebrile  - hx resistant e.coli UTI  - per OSH micro tech, no blood cxs were drawn  - urine cx (OSH): + e.coli (resitant to cefazolin, ampicillin, levaquin, augmentin, cipro; sensitive to  merrem, ceftriaxone & bactrim)  - BAL 12/17 (OSH): usual upper respiratory flora, candida species  - CT chest (OSH): dense L base consolidation  - blood cx 12/18: NGTD  - sputum cx 12/18: GS w/o organisms; cx NGTD  - UA 12/18: 1+ leuks, + nitrites, 20-50 WBC, few bacteria  - urine cx 12/19: < 100,000 candida albicans  - strep pneumo and legionella urine Ags negative  - procalcitonin 0.22 (12/18)  - no suitable pocket of ascites (on bedside US) for a paracentesis   PLAN  - continue ceftriaxone and doxycycline x 7 days     FEN  - NPO  - tube feedings: Isosource 1.5 at 35 mL/hr w/ 3 ProSource protein packs/day  - IVF: none  - review electrolytes and replace as needed    PPX:  Lines: PIV, R IJ TLC --> will dc  Drains/Tubes: Corpak  Indwelling Urinary Catheter: No  DVT: SCDs; Heparin  GI: Protonix  PT/OT: Yes  Insulin: Yes  Reviewed Quality Checklist with RN: Yes    Code Status: Full Code  Disposition/Family: Remains stable for transfer to floor.    Scheryl Darter, APRN  Pulm/Critical Care  Pager [phone]  M2 (2nd call/night) Pager [phone]  05/23/2017 1532 __________________________________________________________________________________    Subjective:     Diane Savage is a 61 y.o. female who is awake and alert, resting in bed. States she did not sleep well last night; c/o back pain. Frequent BMs overnight and this  AM. Would like something to drink.    ROS: Denies HA, chest pain/pressure, SOB, cough, abdominal pain, N/V, or rash.    Objective:     Medications:  Scheduled Meds:    aspirin chewable tablet 81 mg 81 mg Oral QDAY   cefTRIAXone (ROCEPHIN) IVP 1 g 1 g Intravenous Q24H*   diltiazem(#) (cardIZEM) solution 30 mg 30 mg Oral Q6H*   doxycycline (VIBRAMYCIN) tablet 100 mg 100 mg Oral BID   folic acid(#) (FOLVITE) solution 1 mg 1 mg Oral QDAY   furosemide (LASIX) oral solution 40 mg 40 mg Oral QDAY   heparin (porcine) PF syringe 5,000 Units 5,000 Units Subcutaneous Q8H   insulin aspart U-100 (NOVOLOG FLEXPEN) injection PEN 0-7 Units 0-7 Units Subcutaneous ACHS   insulin NPH (HUMULIN N KwikPen) injection PEN 14 Units 14 Units Subcutaneous QDAY(07)   lactulose oral solution 20 g 30 mL Oral TID   levothyroxine (SYNTHROID) tablet 175 mcg 175 mcg Oral QDAY(07)   magnesium oxide (MAG-OX) tablet 400 mg 400 mg Oral BID   magnesium sulfate   1 g/D5W 100 mL IVPB 1 g Intravenous ONCE   metoprolol(#) (LOPRESSOR) solution 50 mg 50 mg Oral BID   pantoprazole(#) (PROTONIX) suspension 40 mg 40 mg Oral BID(11-21)   potassium chloride oral solution 20 mEq 20 mEq Oral QDAY   potassium chloride oral solution 80 mEq 80 mEq Oral ONCE   vitamins, multi w/iron (CERTAVITE) oral solution 15 mL 15 mL Per Corpak Tube QDAY   Continuous Infusions:  PRN and Respiratory Meds:pancrelipase 20,000 Units/ sodium bicarbonate 650 mg(#) PRN (On Call from Rx)                       Vital Signs: Last Filed                  Vital Signs: 24 Hour Range   BP: 103/40 (12/21 0400)  Temp: 36.6 ???C (97.8 ???F) (12/21 0400)  Pulse: 88 (12/21 0400)  Respirations: 18 PER MINUTE (12/21 0400)  SpO2: 97 % (12/21 0400) O2 Delivery: Nasal Cannula (12/21 0400)  SpO2 Pulse: 88 (12/21 0400) BP: (103-148)/(40-72)   Temp:  [36.6 ???C (97.8 ???F)-37.6 ???C (99.7 ???F)]   Pulse:  [62-92]   Respirations:  [17 PER MINUTE-23 PER MINUTE]   SpO2:  [92 %-98 %]   O2 Delivery: Nasal Cannula   Intensity Pain Scale (Self Report): 6 (05/22/17 2000) Vitals:    05/21/17 0942 05/22/17 0200 05/23/17 0400   Weight: 131.3 kg (289 lb 7.4 oz) 124.8 kg (275 lb 2.2 oz) 127.2 kg (280 lb 6.8 oz)           Intake/Output Summary:  (Last 24 hours)    Intake/Output Summary (Last 24 hours) at 05/23/2017 0610  Last data filed at 05/23/2017 0500  Gross per 24 hour   Intake 1379 ml   Output 2570 ml   Net -1191 ml         Physical Exam:      General: awake and alert, no distress, appears stated age  Head: normocephalic, without obvious abnormality, atraumatic  Eyes: conjunctivae/corneas clear  Mouth/throat: moist mucous membranes; corpak in place  Neck: supple, symmetrical, trachea midline  Lungs: clear to auscultation bilaterally, diminished in bases; no wheeze  Heart: regular rate and rhythm, no murmur appreciated  Abdomen: obese, soft, non-tender; bowel sounds active  Extremities: normal, atraumatic, no cyanosis, 1+ BUE & 3+ BLE edema  Pulses: 2+ and symmetric, all extremities  Skin:  no rashes or lesions noted  Neurologic: grossly normal    Artificial airway:  None                                                                                         Ventilator/ Respiratory Therapy:  No     Vent weaning trial:  Not applicable      Laboratory:  LABS:  Recent Labs      05/20/17   1831  05/20/17   2200  05/21/17   0340  05/22/17   0348  05/23/17   0341   NA  140   --   139  143  144   K  3.8   --   3.5  3.2*  3.2*   CL  103   --   103  105  106   CO2  26   --   27  30  34*   GAP  11   --   9  8  4    BUN  38*   --   39*  39*  33*   CR  1.84*   --   1.79*  1.72*  1.54*   GLU  303*   --   302*  183*  194*   CA  9.0   --   8.7  9.1  8.9   ALBUMIN  3.0*   --   2.6*  2.9*  2.6* MG  2.0   --   2.0  2.0  1.9   PO4  3.6   --   3.2  3.2  3.3   TSH  2.850  1.990   --    --    --        Recent Labs      05/20/17   1831  05/21/17   0340  05/22/17   0348  05/23/17   0341   WBC  5.8  4.0*  9.6  6.0   HGB  8.8*  8.1*  9.0*  8.5*   HCT  26.0*  24.2*  26.7*  25.9*   PLTCT  157  113*  184  138*   INR   --   1.3*  1.2   --    AST  14  10  15  13    ALT  6*  5*  6*  7   ALKPHOS  51  41  42  39   TNI  0.02  0.01   --    --       Estimated Creatinine Clearance: 51.3 mL/min (A) (based on SCr of 1.54 mg/dL (H)).  Vitals:    05/21/17 0942 05/22/17 0200 05/23/17 0400   Weight: 131.3 kg (289 lb 7.4 oz) 124.8 kg (275 lb 2.2 oz) 127.2 kg (280 lb 6.8 oz)      Recent Labs      05/21/17   0340  05/21/17   1457   PHART  7.43  7.37   PO2ART  101*  83                 Radiology and Other Diagnostic  Procedures Review:    Reviewed pertinent studies.

## 2017-05-23 NOTE — Progress Notes
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 She was transferred from [facility] on 12/18 after presenting there on 12/16 for syncope/fatigue. Admit c/b AMS/respiratory failure requiring intubation, afib w/ RVR & septic shock from HCAP + UTI. On arrival, NE was able to be stopped & she was extubated 12/19 AM.  Mental / Cognitive Status: Alert;Oriented;Cooperative;Follows Commands  Persons Present: RehabTechnician;Spouse  Pain: Patient has no complaint of pain  Comments: 3L 02 NC  Ambulation Assist: Independent Mobility at [employer] Level with Device  Patient Owned Equipment: Nurse, adult  Home Situation: Lives with Family  Type of Home: House  Entry Stairs: Ramp  In-Home Stairs: No Stairs    BED MOBILITY/TRANSFERS:  Bed Mobility/Transfers  Bed Mobility: Supine to Sit: Moderate Assist;x2 People;Head of Bed Elevated;Assist with Trunk  Bed Mobility: Sit to Supine: Maximum Assist;x2 People;Assist with Trunk;Assist with B LE  Transfer Type: Sit to Stand(Two standing trials)  Transfer: Assistance Level: From;Bed;Minimal Assist  Transfer: Assistive Device: Nurse, adult  Transfers: Type Of Assistance: Materials engineer;For Strength Deficit;For Safety Considerations  End Of Activity Status: In Bed;Nursing Notified;Instructed Patient to Request Assist with Mobility;Instructed Patient to Use Call Light  Comments: Increased 02 from 3 to 6L 02 NC with activity    GAIT:  Gait Gait Distance: (sidestepped length of bed with one seated rest break)  Activity Limited By: Complaint of Fatigue;Weakness    ACTIVITY/EXERCISE:  Activity / Exercise  Sit Edge Of Bed: 20 minutes  Sit Edge Of Bed Assist: Stand By Assist  Stand At Bedside : (Two stands for ~30 seconds each)  Comments: Performed brushing teeth side of bed stand by assist     ASSESSMENT/PROGRESS:  Assessment/Progress  Impaired Mobility Due To: Decreased Strength;Decreased Activity Tolerance;Deconditioning;Medical Status Limitation  Assessment/Progress: Should Improve w/ Continued PT  AM-PAC 6 Clicks Basic Mobility Inpatient  Turning from your back to your side while in a flat bed without using bed rails: A lot  Moving from lying on your back to sitting on the side of a flatbed without using bedrails : A Lot  Moving to and from a bed to a chair (including a wheelchair): A Lot  Standing up from a chair using your arms (e.g. wheelchair, or bedside chair): A Little  To walk in hospital room: A Lot  Climbing 3-5 steps with a railing: Total  Raw Score: 12  Standardized (T-scale) Score: 32.23  Basic Mobility CMS 0-100%: 61.94  CMS G Code Modifier for Basic Mobility: CL    GOALS:  Goals  Goal Formulation: With Patient  Time For Goal Achievement: 5 days  Pt Will Go Supine To/From Sit: w/ Minimal Assist  Pt Will Transfer Bed/Chair: w/ Stand By Assist  Pt Will Transfer Sit to Stand: w/ Stand By Assist  Pt Will Ambulate: 1-10 Feet, w/ Dan Humphreys, w/ Minimal Assist    PLAN:  Plan   Treatment Interventions: Mobility Training;Strengthening  Plan Frequency: 5 Days per Week  PT Plan for Next Visit: Up to chair; transfers; progress to ambulation  RECOMMENDATIONS:  PT Discharge Recommendations  PT Discharge Recommendations: Inpatient Setting    Therapist: Ernestine Mcmurray, PT  Date: 05/23/2017

## 2017-05-23 NOTE — Progress Notes
SPEECH-LANGUAGE PATHOLOGY  DAILY TREATMENT NOTE      Patient seen 1x this date.  Documentation reflects all daily treatment sessions.    SUMMARY OF THERAPY SESSION: Dysphagia treatment completed this date. Moderate-severe oropharyngeal dysphagia present at this time. Consistent s/sx of aspiration/penetration noted with thins, nectars, and puree solids not resolved with compensatory strategies. Pt did not pass 3oz water protocol to rule out presence of silent aspiration. Pt adamant about pursuing PO intake of liquids despite education of risks of aspiration. Sources of dysphagia include debility due to medical course and decreased airway protection at level of vocal cords due to intubation. Please see further details below.     RECOMMENDATIONS:  Remain NPO at this time.   Continue Corpak for nutrition/medication/hydration.  Initiate ice chip protocol (7-10 per hour). Guidelines provided to RN.   Frequent oral care to minimize risk for aspiration of bacteria in secretions.  Ongoing dysphagia assessment and treatment.  Pt discussed with family and primary team.     Goal : Pt will participate in ongoing dysphagia assesment and treatment given mild-mod cues.   Met  Comment: Pt seated upright in bed. Ice chip x3, thin x12 (x3 tsp, x5 straw drinks, x3 straw drinks, x1 3oz water protocol), puree x5. Pt demonstrated functional withdrawal of bolus from cup, spoon, and straw. Timely bolus manipulation and a/p transfer. Variable swallow initiation from timely to moderately delayed; reduced hyolaryngeal excursion as perceived upon palpation. Cough x6, throat clear x3 with thins, nectars, and purees indicative of possible aspiration/penetration. Chin tuck, effortful swallow, small bites/sips, slow rate of intake all ineffective to eliminate s/sx.   Continue to address this goal    Goal : Pt will tolerate ice chip protocol with less than 5% s/s of aspiration and without negative pulmonary status changes.  Met. Comment: No s/sx in 3/3 opportunities. See above.   Continue to address this goal.     PLAN / RECOMMENDATIONS:  Continue treatment 3-5x/week    Therapist: Lambert Keto, MHS, L/CCC-SLP Voalte: [id number]  Date: 05/23/2017

## 2017-05-23 NOTE — Progress Notes
OCCUPATIONAL THERAPY  NO TREATMENT NOTE     On OT approach, pt agreeable to working with therapy, but requested to get on the bedpan. Pt rolled side <> side with maximal assist to get on the bedpan. Left pt with call light in reach and alerted RN. Occupational therapy will continue to follow and provide intervention as indicated.    Therapist: Jenny Reichmann, OTR/L [phone]  Date: 05/23/2017

## 2017-05-24 LAB — CBC AND DIFF: Lab: 3.9 10*3/uL — ABNORMAL LOW (ref 4.5–11.0)

## 2017-05-24 LAB — BLOOD GASES, CENTRAL VENOUS
Lab: 30 MMOL/L
Lab: 37 mmHg — ABNORMAL LOW (ref 40–50)
Lab: 6.8 MMOL/L
Lab: 61 mmHg (ref >40)
Lab: 66 % (ref 65–75)
Lab: 7.3 (ref 7.30–7.40)

## 2017-05-24 LAB — POC GLUCOSE
Lab: 187 mg/dL — ABNORMAL HIGH (ref 70–100)
Lab: 181 mg/dL — ABNORMAL HIGH (ref 70–100)
Lab: 164 mg/dL — ABNORMAL HIGH (ref 70–100)
Lab: 112 mg/dL — ABNORMAL HIGH (ref 70–100)

## 2017-05-24 LAB — COMPREHENSIVE METABOLIC PANEL: Lab: 141 MMOL/L (ref 137–147)

## 2017-05-24 LAB — PHOSPHORUS: Lab: 2.5 mg/dL — ABNORMAL LOW (ref >60)

## 2017-05-24 LAB — PROTIME INR (PT): Lab: 1.2 MMOL/L (ref 0.8–1.2)

## 2017-05-24 LAB — MAGNESIUM: Lab: 1.9 mg/dL — ABNORMAL LOW (ref 1.6–2.6)

## 2017-05-24 MED ORDER — FUROSEMIDE 40 MG PO TAB
40 mg | Freq: Once | ORAL | 0 refills | Status: CP
Start: 2017-05-24 — End: ?
  Administered 2017-05-24: 19:00:00 40 mg via ORAL

## 2017-05-24 MED ORDER — METOPROLOL TARTRATE 50 MG PO TAB
50 mg | Freq: Two times a day (BID) | ORAL | 0 refills | Status: DC
Start: 2017-05-24 — End: 2017-06-06
  Administered 2017-05-25 – 2017-06-06 (×25): 50 mg via ORAL

## 2017-05-24 MED ORDER — PANTOPRAZOLE 40 MG PO TBEC
40 mg | Freq: Two times a day (BID) | ORAL | 0 refills | Status: DC
Start: 2017-05-24 — End: 2017-06-06
  Administered 2017-05-25 – 2017-06-06 (×24): 40 mg via ORAL

## 2017-05-24 MED ORDER — FUROSEMIDE 40 MG PO TAB
40 mg | Freq: Every day | ORAL | 0 refills | Status: DC
Start: 2017-05-24 — End: 2017-05-28
  Administered 2017-05-25 – 2017-05-28 (×4): 40 mg via ORAL

## 2017-05-24 MED ORDER — POTASSIUM CHLORIDE 20 MEQ PO TBTQ
20 meq | Freq: Every day | ORAL | 0 refills | Status: DC
Start: 2017-05-24 — End: 2017-06-06
  Administered 2017-05-25 – 2017-06-05 (×12): 20 meq via ORAL

## 2017-05-24 MED ORDER — MULTIVITAMIN PO TAB
1 | Freq: Every day | ORAL | 0 refills | Status: DC
Start: 2017-05-24 — End: 2017-06-06
  Administered 2017-05-25 – 2017-06-06 (×13): 1 via ORAL

## 2017-05-24 MED ADMIN — ATROPINE 0.1 MG/ML IJ SYRG [730]: 0.5 mg | INTRAVENOUS | @ 05:00:00 | Stop: 2017-05-24 | NDC 76329333901

## 2017-05-24 MED ADMIN — ATROPINE 0.1 MG/ML IJ SYRG [730]: 0.5 mg | INTRAVENOUS | @ 05:00:00 | Stop: 2017-05-24 | NDC 00409491111

## 2017-05-24 NOTE — Progress Notes
Report called to Ebony Hail, RN on [address] at this time.

## 2017-05-24 NOTE — Transfer Summaries
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 (HCC)    Sepsis (HCC)    Morbid obesity (HCC)    Acute hypoxemic respiratory failure [facility])    Hospital Course: Diane Savage???is a 61 y/o???female???with PMH significant for 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 Admit c/b AMS/respiratory failure requiring intubation, afib w/ RVR & septic shock from HCAP + UTI.???On arrival, NE was able to be stopped & she was extubated 12/19 AM. W/ hx of HF & no longer in shock have cautiously resumed diuresis. Afib has been rate controlled here, dc'd amiodarone & resumed metoprolol/cardizem at lower than PTA doses. Overnight 12/21-12/22, pt became bradydardic and hypotensive -- given atropine x 2, IVF, and briefly on dopamine drip. Have dc'd cardizem from [facility]. Antbx narrowed to ceftriaxone and doxycycline for PNA & UTI; will complete 12/23.???  Significant Medication Information (to include antibiotic duration/indication, anticoagulation and steroids, etc.):   - have continued PTA lasix 40 mg daily; gave extra dose today 11/22  - continued PTA metoprolol, but at reduced dose of 50 mg PO BID - dc'd cardizem w/ bradycardia overnight  - on NPH w/ low dose correction factor  - ceftriaxone and doxycycline x 7 days (through 12/23)  Procedures With Dates: none  Consults:  none  Follow-Up Items:    - SLP recs  - hepatology f/u  Activity/Weight bearing status: PT/OT; stood at bedside w/ PT on 12/21  Nutrition: has dysphagia -- SLP following; pt demanded corpak be removed, despite counseling on risk of aspiration; have ordered mechanical soft/nectar liquids -- diabetic/cardiac diet  Discharge Plan: PT/OT recommending inpatient settings    Scheryl Darter, APRN  Pulmonary/Critical Care Medicine  Pager [phone]  M2 (2nd call/night) Pager [phone]  05/24/2017

## 2017-05-24 NOTE — Progress Notes
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 She was transferred from [facility] on 12/18 after presenting there on 12/16 for syncope/fatigue. Admit c/b AMS/respiratory failure requiring intubation, afib w/ RVR & septic shock from HCAP + UTI. On arrival, NE was able to be stopped & she was extubated 12/19 AM. W/ hx of HF & no longer in shock have cautiously resumed diuresis. Afib has been rate controlled here, dc'd amiodarone & resumed metoprolol/cardizem at lower than PTA doses. Overnight 12/21-12/22, pt became bradydardic and hypotensive -- given atropine x 2, IVF, and briefly on dopamine drip. Have dc'd cardizem from [facility]. Antbx narrowed to ceftriaxone and doxycycline for PNA & UTI; will complete 12/23.     Assessment/Plan:      NEURO  Acute Encephalopathy (resolved)  - developed AMS the day of admit to OSH in the setting of septic shock  - CT head (OSH): no acute process  - ammonia level 95 (12/19)  - on exam, pt is alert and oriented  PLAN  - continue PTA tizanidine and tramadol    PULM Acute Hypercarbic & Hypoxemic Respiratory Failure (improved)  L Pleural Effusion  - d/t HCAP   - CT chest (OSH): circumferential L pleural effusion & densely consolidated L base w/ air bronchograms  - extubated 12/19  - currently on 5 L/NC, O2 sat 98%  PLAN  - diuresis as below    CV  Chronic Combined HF  CAD, HTN  - 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  - echo 10/18: LVEF 55-60%, dilated RV with hypocontractility, severe RA enlargement, moderate TR, moderate LA enlargement, + intra-atrial PFO with bidirectional shunt, s/p left atrial appendage ligation  - repeat echo 05/21/17: LVEF 50-55%, no regional wall motion abnormalities; RV mildly dilated but function probably normal; mild to moderate MR; mild to moderate TR; peak PAP 41 mmHg; no pericardial effusion  - EKG: no ST changes  - troponin negative x 2  - BNP 247 (prior 1,178)  - PTA lasix 40 mg daily, but then given IVF when hypotensive overnight  PLAN  - continue PTA ASA & lasix; give extra dose lasix 40 mg PO x 1 today  Septic Shock (resolved)  - arrived to [place] on pressor, but essentially stopped immediately  -???central venous O2 sat 84%  - hypotensive overnight and briefly on dopamine (d/t bradycardia) -- off since ~ 0200  - SBP 90s-110s  Bradycardia; Chronic Afib/Flutter s/p Atrial Appendage Ligation  - no PTA AC d/t recurrent GIBs  - received amiodarone load  at OSH & continued on a drip --> dc'd 12/19  - EKG 12/20 AM: aflutter, rate 79, QTc 445  - EKG 12/21 PM: aflutter, rate 81, QTc 421  - HR as low as 40s overnight --> given atropine x 2, IVF, and started on dopamine drip --> able to wean off dopamine ~ 0200  - remains in aflutter, rate 60s-70s, this AM  PLAN  - continue PTA metoprolol at reduced dose of 50 mg BID (PTA dose is 100 mg BID)  - hold PTA diltiazem    GI  GAVE  Transfusion-Dependent Anemia  - follows with [facility]  - EGD 10/15: 3 columns of small EV, portal HTN gastropathy & mild GAVE  - hgb 8.5 on 12/21 --> 7.2 this AM  - plt 107  PLAN  - continue PTA PPI Liver Disease  - c/b portal HTN, congestive colopathy, EV, GAVE & ascites  - limited prior w/u; was to see hepatology in clinic in January  - [place] 02/2017: diffuse hepatic steatosis  - EGD as above  - MELD-Na 10  Dysphagia  - moderate oropharyngeal dysphagia per SLP  - corpak in place w/ TFs --> pt demanding corpak be removed and she be allowed to eat; pt aware of risk of aspiration  - mechanical soft/nectar liquid diet ordered    RENAL  AKI on CKD (improving)  - baseline Cr 1.3  - Cr 2.4 on arrival at OSH --> 1.19 this AM  - I/O: not accurate    ENDO  DM2  - PTA regimen: NPH 14 units Q AM, novolog 4 units TID  - BS 180s-190s last 24 hrs  PLAN  - continue NPH + low dose correction factor  Hypothyroidism  - TSH 1.99 (12/18)  PLAN  - continue PTA synthroid    ID  Septic Shock  HCAP  UTI  - WBC 3.9; afebrile  - hx resistant e.coli UTI  - per OSH micro tech, no blood cxs were drawn  - urine cx (OSH): + e.coli (resitant to cefazolin, ampicillin, levaquin, augmentin, cipro; sensitive to  merrem, ceftriaxone & bactrim)  - BAL 12/17 (OSH): usual upper respiratory flora, candida species  - CT chest (OSH): dense L base consolidation  - blood cx 12/18: NGTD  - sputum cx 12/18: GS w/o organisms; cx NGTD  - UA 12/18: 1+ leuks, + nitrites, 20-50 WBC, few bacteria  - urine cx 12/19: < 100,000 candida albicans  - strep pneumo and legionella urine Ags negative  - procalcitonin 0.22 (12/18)  - no suitable pocket of ascites (on bedside US) for a paracentesis   PLAN  - continue ceftriaxone and doxycycline x 7 days (through 12/23)    FEN  - NPO --> cardiac/diabetic diet -- mechanical soft/nectar liquids  - tube feedings: dc'd  - IVF: none  - review electrolytes and replace as needed    PPX:  Lines: R IJ TLC   Drains/Tubes: Corpak --> dc'd per pt's wishes  Indwelling Urinary Catheter: No  DVT: SCDs; Heparin  GI: Protonix  PT/OT: Yes  Insulin: Yes  Reviewed Quality Checklist with RN: Yes    Code Status: Full Code Disposition/Family: Remains stable for transfer to floor.     Scheryl Darter, APRN  Pulm/Critical Care  Pager [phone]  M2 (2nd call/night) Pager [phone]  05/24/2017 1125  __________________________________________________________________________________    Subjective:     Diane Savage is a 61 y.o. female who is awake and alert, resting in bed. States she did not sleep  well last night. Demanding that corpak be removed, states I'm the boss on this one. I want it out and I want to eat. Feels that her edema is improving, I have wrinkles now.    ROS: Denies HA, chest pain/pressure, SOB, cough, abdominal pain, N/V, or rash.    Objective:     Medications:  Scheduled Meds:    aspirin chewable tablet 81 mg 81 mg Oral QDAY   cefTRIAXone (ROCEPHIN) IVP 1 g 1 g Intravenous Q24H*   doxycycline (VIBRAMYCIN) tablet 100 mg 100 mg Oral BID   folic acid(#) (FOLVITE) solution 1 mg 1 mg Oral QDAY   furosemide (LASIX) oral solution 40 mg 40 mg Oral QDAY   heparin (porcine) PF syringe 5,000 Units 5,000 Units Subcutaneous Q8H   insulin aspart U-100 (NOVOLOG FLEXPEN) injection PEN 0-7 Units 0-7 Units Subcutaneous ACHS   insulin NPH (HUMULIN N KwikPen) injection PEN 14 Units 14 Units Subcutaneous QDAY(07)   LACTATED RINGERS IV SOLP (Cabinet Override)   NOW   levothyroxine (SYNTHROID) tablet 175 mcg 175 mcg Oral QDAY(07)   magnesium oxide (MAG-OX) tablet 400 mg 400 mg Oral BID   metoprolol(#) (LOPRESSOR) solution 50 mg 50 mg Oral BID   pantoprazole(#) (PROTONIX) suspension 40 mg 40 mg Oral BID(11-21)   potassium chloride oral solution 20 mEq 20 mEq Oral QDAY   tiZANidine (ZANAFLEX) tablet 4 mg 4 mg Oral QHS   vitamins, multi w/iron (CERTAVITE) oral solution 15 mL 15 mL Per Corpak Tube QDAY   Continuous Infusions:  PRN and Respiratory Meds:pancrelipase 20,000 Units/ sodium bicarbonate 650 mg(#) PRN (On Call from Rx), traMADol Q6H PRN                       Vital Signs: Last Filed                  Vital Signs: 24 Hour Range BP: 116/79 (12/22 0400)  Temp: 36.6 ???C (97.8 ???F) (12/22 0400)  Pulse: 73 (12/22 0450)  Respirations: 15 PER MINUTE (12/22 0450)  SpO2: 100 % (12/22 0450)  SpO2 Pulse: 67 (12/22 0400) BP: (81-151)/(39-79)   Temp:  [36.4 ???C (97.5 ???F)-37.3 ???C (99.1 ???F)]   Pulse:  [51-96]   Respirations:  [15 PER MINUTE-22 PER MINUTE]   SpO2:  [89 %-100 %]      Vitals:    05/21/17 0942 05/22/17 0200 05/23/17 0400   Weight: 131.3 kg (289 lb 7.4 oz) 124.8 kg (275 lb 2.2 oz) 127.2 kg (280 lb 6.8 oz)           Intake/Output Summary:  (Last 24 hours)    Intake/Output Summary (Last 24 hours) at 05/24/2017 9811  Last data filed at 05/24/2017 0400  Gross per 24 hour   Intake 618 ml   Output 0 ml   Net 618 ml         Physical Exam:      General: awake and alert, no distress, appears stated age  Head: normocephalic, without obvious abnormality, atraumatic  Eyes: conjunctivae/corneas clear  Mouth/throat: moist mucous membranes; corpak in place  Neck: supple, symmetrical, trachea midline  Lungs: clear to auscultation bilaterally, diminished in bases; no wheeze  Heart: regular rate and rhythm, no murmur appreciated  Abdomen: obese, soft, non-tender; bowel sounds active  Extremities: normal, atraumatic, no cyanosis, 1+ BUE & 2-3+ BLE edema  Pulses: 2+ and symmetric, all extremities  Skin: no rashes or lesions noted  Neurologic: grossly normal    Artificial airway:  None                                                                                         Ventilator/ Respiratory Therapy:  No     Vent weaning trial:  Not applicable      Laboratory:  LABS:  Recent Labs      05/22/17   0348  05/23/17   0341  05/24/17   0343   NA  143  144  141   K  3.2*  3.2*  3.6   CL  105  106  105   CO2  30  34*  32*   GAP  8  4  4    BUN  39*  33*  34*   CR  1.72*  1.54*  1.19*   GLU  183*  194*  194*   CA  9.1  8.9  8.7   ALBUMIN  2.9*  2.6*  2.3*   MG  2.0  1.9  1.9   PO4  3.2  3.3  2.5       Recent Labs      05/22/17   0348  05/23/17   0341  05/24/17   0343 WBC  9.6  6.0  3.9*   HGB  9.0*  8.5*  7.2*   HCT  26.7*  25.9*  21.9*   PLTCT  184  138*  107*   INR  1.2  1.2   --    AST  15  13  10    ALT  6*  7  6*   ALKPHOS  42  39  35      Estimated Creatinine Clearance: 66.4 mL/min (A) (based on SCr of 1.19 mg/dL (H)).  Vitals:    05/21/17 0942 05/22/17 0200 05/23/17 0400   Weight: 131.3 kg (289 lb 7.4 oz) 124.8 kg (275 lb 2.2 oz) 127.2 kg (280 lb 6.8 oz)      Recent Labs      05/21/17   1457   PHART  7.37   PO2ART  83                 Radiology and Other Diagnostic Procedures Review:    Reviewed pertinent studies.

## 2017-05-24 NOTE — Progress Notes
Patient arrived to room [phone] via bed accompanied by transport. Patient transferred to the bed with assistance. Bedside safety checks completed. Initial patient assessment completed, refer to flowsheet for details. Admission skin assessment completed by: Katrina Stack, RN and Nanine Means, RN    Pressure Injury Present on Hospital Admission (within 24 hours): No    1. Occiput: No  2. Ear: No  3. Scapula: No  4. Spinous Process: No  5. Shoulder: No  6. Elbow: No  7. Iliac Crest: No  8. Sacrum/Coccyx: No  9. Ischial Tuberosity: No  10. Trochanter: No  11. Knee: No  12. Malleolus: No  13. Heel: No  14. Toes: No  15. Assessed for device associated injury Yes  16. Nursing Nutrition Assessment Completed Yes    See Doc Flowsheet for additional wound details.     INTERVENTIONS: Standard pressure injury prevention, q2 turns due to inability to ambulate at this time.

## 2017-05-24 NOTE — Progress Notes
Pt left unit with transport via bed to [id number] at 1017. Patient is stable. Patient's chart, medications and belongings with patient. Updates called to Ebony Hail, RN at this time.

## 2017-05-24 NOTE — Progress Notes
Heart Failure Nursing Progress Note    Admission Date: 05/20/2017  LOS: 4 days    Admission Weight: 132 kg (291 lb 0.1 oz)        Most recent weights (inpatient):   Vitals:    05/22/17 0200 05/23/17 0400 05/24/17 0600   Weight: 124.8 kg (275 lb 2.2 oz) 127.2 kg (280 lb 6.8 oz) 132 kg (291 lb 0.1 oz)     Weight change from previous day: +4.8    Fluid restriction ordered: None    Intake/Output Summary: (Last 24 hours)    Intake/Output Summary (Last 24 hours) at 05/24/2017 1610  Last data filed at 05/24/2017 0400  Gross per 24 hour   Intake 618 ml   Output 0 ml   Net 618 ml       Is patient incontinent: No    Anticipated discharge date: TBD  Discharge goals: Manage HF      Daily Assessment of Patient Stated Goals:    Short Term Goal Identified by patient (Short Term=during hospitalization):  Decrease edema

## 2017-05-24 NOTE — Progress Notes
SPEECH-LANGUAGE PATHOLOGY  DAILY TREATMENT NOTE      Patient seen 1x this date.  Documentation reflects all daily treatment sessions.    SUMMARY OF THERAPY SESSION:   Significant improvement in oropharyngeal swallow today. Vocal quality judged to be [facility] and back to pt's baseline. No s/s of aspiration with thin liquids (single or consecutive drinks) or with various solids. Primary team initiated PO diet this morning, which pt has reportedly been tolerating. Observed pt swallow pills whole with thin without difficulty. Pt conversational and does not exhibit confusion at bedside. Please see below for details/recommendations.    RECOMMENDATIONS  Regular Solids, thin liquids     -Choose soft foods off of regular menu until pt obtains her dentures     -Small bites/sips, must be fully upright  Medications as tolerated  Ongoing speech therapy to ensure toleration      Goal : Pt will participate in ongoing dysphagia assesment and treatment given mild-mod cues.   Met  Comment: Pt's swallow assessed with thin liquids (single and consecutive drinks) across several trials without s/s of aspiration. Oral and pharyngeal phases of swallow judged to be WNL with liquids and with pureed solids. Pt exhibits prolonged mastication with mech soft solids d/t limited dentition and because she does not have her dentures with her at the hospital. Discussed importance of choosing only soft foods off of regular menu for this reason. Pt verbalizes understanding and appears to have capacity to make decisions regarding appropriate solid choices.   Discharge this goal as met     New Goal: Pt will tolerate regular solids, thin liquids with <5% s/s of aspiration and without negative pulmonary status changes.          PLAN / RECOMMENDATIONS:  Continue treatment 2-3x/week          Therapist: Lavada Mesi , L/CCC-SLP (Pager [phone]; [phone])  Date: 05/24/2017

## 2017-05-25 LAB — COMPREHENSIVE METABOLIC PANEL: Lab: 137 MMOL/L — ABNORMAL LOW (ref 137–147)

## 2017-05-25 LAB — CBC AND DIFF: Lab: 3.8 K/UL — ABNORMAL LOW (ref >60)

## 2017-05-25 LAB — MAGNESIUM
Lab: 1.8 mg/dL — ABNORMAL LOW (ref 1.6–2.6)
Lab: 1.8 mg/dL (ref 1.6–2.6)

## 2017-05-25 LAB — BASIC METABOLIC PANEL
Lab: 100 MMOL/L (ref 98–110)
Lab: 136 MMOL/L — ABNORMAL LOW (ref 137–147)

## 2017-05-25 LAB — POC GLUCOSE
Lab: 113 mg/dL — ABNORMAL HIGH (ref 70–100)
Lab: 138 mg/dL — ABNORMAL HIGH (ref 70–100)
Lab: 189 mg/dL — ABNORMAL HIGH (ref 70–100)
Lab: 147 mg/dL — ABNORMAL HIGH (ref 70–100)
Lab: 131 mg/dL — ABNORMAL HIGH (ref 70–100)

## 2017-05-25 LAB — PROTIME INR (PT): Lab: 1.2 MMOL/L — ABNORMAL LOW (ref 0.8–1.2)

## 2017-05-25 LAB — PHOSPHORUS: Lab: 2.5 mg/dL — ABNORMAL LOW (ref 2.0–4.5)

## 2017-05-25 MED ORDER — MAGNESIUM SULFATE IN D5W 1 GRAM/100 ML IV PGBK
1 g | Freq: Once | INTRAVENOUS | 0 refills | Status: CP
Start: 2017-05-25 — End: ?
  Administered 2017-05-26: 04:00:00 1 g via INTRAVENOUS

## 2017-05-25 MED ORDER — POTASSIUM CHLORIDE 20 MEQ PO TBTQ
40 meq | Freq: Once | ORAL | 0 refills | Status: CP
Start: 2017-05-25 — End: ?
  Administered 2017-05-26: 04:00:00 40 meq via ORAL

## 2017-05-25 MED ORDER — FUROSEMIDE 10 MG/ML IJ SOLN
40 mg | Freq: Once | INTRAVENOUS | 0 refills | Status: CP
Start: 2017-05-25 — End: ?
  Administered 2017-05-25: 21:00:00 40 mg via INTRAVENOUS

## 2017-05-25 MED ORDER — FLUCONAZOLE 200 MG PO TAB
200 mg | Freq: Every day | ORAL | 0 refills | Status: CP
Start: 2017-05-25 — End: ?
  Administered 2017-05-25 – 2017-05-31 (×7): 200 mg via ORAL

## 2017-05-25 MED ORDER — FOLIC ACID 1 MG PO TAB
1 mg | Freq: Every day | ORAL | 0 refills | Status: DC
Start: 2017-05-25 — End: 2017-06-06
  Administered 2017-05-25 – 2017-06-06 (×13): 1 mg via ORAL

## 2017-05-25 MED ORDER — MAGNESIUM OXIDE 400 MG (241.3 MG MAGNESIUM) PO TAB
400 mg | Freq: Once | ORAL | 0 refills | Status: CP
Start: 2017-05-25 — End: ?
  Administered 2017-05-25: 21:00:00 400 mg via ORAL

## 2017-05-25 NOTE — Progress Notes
Heart Failure Nursing Progress Note    Admission Date: 05/20/2017  LOS: 4 days    Admission Weight: 132 kg (291 lb 0.1 oz)        Most recent weights (inpatient):   Vitals:    05/22/17 0200 05/23/17 0400 05/24/17 0600   Weight: 124.8 kg (275 lb 2.2 oz) 127.2 kg (280 lb 6.8 oz) 132 kg (291 lb 0.1 oz)     Weight change from previous day: +4.8kg    Fluid restriction ordered: None    Intake/Output Summary: (Last 24 hours)    Intake/Output Summary (Last 24 hours) at 05/24/2017 2009  Last data filed at 05/24/2017 1300  Gross per 24 hour   Intake 986 ml   Output 375 ml   Net 611 ml       Is patient incontinent No, however pt did miss bedpan for one urine occurrence and brief scale was not used    Anticipated discharge date: Plan ongoing  Discharge goals: Return to baseline level of function      Daily Assessment of Patient Stated Goals:    Short Term Goal Identified by patient (Short Term=during hospitalization): none identified

## 2017-05-25 NOTE — Progress Notes
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 (HCC)    Sepsis (HCC)    Morbid obesity (HCC)    Acute hypoxemic respiratory failure (HCC)      Interval:  - Continue doxy and rocephin through 12/23 for HCAP and e.coli UTI  - Extra IV lasix 40mg  given once, repeat BMP and Mag @ 16:00, goal net negative 1-2L in 24h  - Removal of CVC and placement of PIV  - Diflucan 200mg  x 7d (12/23 - ) for oral thrush and vaginal yeast infxn  - PT on call paged to request assessment and therapy today 12/23      NEURO  Acute Encephalopathy (resolved)  - developed AMS the day of admit to OSH in the setting of septic shock  -???CT head (OSH): no acute process  - ammonia level 95 (12/19)  - on exam, pt is alert and answers on 12/23  PLAN  -???continue PTA???tizanidine and tramadol  ???  PULM  Acute Hypercarbic???& Hypoxemic???Respiratory???Failure (Improving)  L Pleural Effusion  - d/t HCAP   -???CT chest (OSH):???circumferential???L???pleural effusion???&???densely consolidated???L???base???w/???air bronchograms  - extubated 12/19  - currently on 3 L/NC, O2 sat 98% as of 12/23, was discharged home on 10/25 on room air from HF service  PLAN  - Continue PO lasix 40mg  daily  - Will give an additional IV lasix 40mg  once 12/23  ???  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,???moderate TR, moderate LA???enlargement, +???intra-atrial PFO with bidirectional shunt, s/p left atrial appendage ligation  - repeat echo 05/21/17: LVEF 50-55%, no regional wall motion abnormalities; RV mildly dilated but function probably normal; mild to moderate MR; mild to moderate TR; peak PAP 41 mmHg; no pericardial effusion  - EKG:???no ST changes  - troponin negative x 2  - BNP 247 (prior 1,178)  - PTA lasix 40 mg daily, but then given IVF when hypotensive overnight  PLAN  -???continue PTA ASA & lasix  - Additional IV lasix 40mg  once 12/23  Septic Shock???(resolved)  - arrived to [facility] on pressor, but essentially stopped immediately  -???central venous O2 sat 84%  - hypotensive overnight and briefly on dopamine (d/t bradycardia) -- off since ~ 0200  - SBP 100-140s  Bradycardia; 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  RENAL  AKI on CKD (resolved)  - baseline Cr 1.3 - Cr 2.4 on arrival at OSH --> 1.3 this AM 12/23  - Will avoid nephrotoxins, renally dose meds, strict I/Os  ???  ENDO  DM2  -???PTA regimen:???NPH 14 units Q AM, novolog 4 units TID  - BS 110-160s last 24 hrs  PLAN  - continue 14U NPH daily + low dose correction factor  Hypothyroidism  -???TSH???1.99 (12/18)  PLAN  -???continue???PTA???synthroid daily  ???  ID  Septic Shock  HCAP  UTI  - WBC???3.9;???afebrile  - hx???resistant e.coli UTI  - per OSH micro tech, no blood cxs were drawn  - urine cx (OSH): + e.coli (resitant to cefazolin, ampicillin, levaquin, augmentin,???cipro; sensitive to  merrem, ceftriaxone & bactrim)  - BAL 12/17???(OSH): usual upper respiratory flora, candida species  -???CT chest (OSH): dense L base consolidation  - blood cx???12/18: NGTD  - sputum cx 12/18: GS w/o organisms; cx NGTD  - UA???12/18: 1+ leuks, + nitrites, 20-50 WBC, few bacteria  - urine cx???12/19: < 100,000 candida albicans  - strep pneumo and legionella urine Ags negative  - procalcitonin???0.22 (12/18)  - no suitable pocket of ascites (on bedside US) for a paracentesis   PLAN  - continue ceftriaxone and doxycycline x 7 days (through 12/23, stop date ordered)  ???  FEN  - Regular solids, thin liquids, swallow precautions  - tube feedings: dc'd  - IVF: none  - review electrolytes and replace as needed  ???  PPX:  Lines: DC CVC on 12/23 and will place PIV  Drains/Tubes: None, corpack dc'd 12/22  Indwelling Urinary Catheter: No  DVT: SCDs; Heparin  GI: Protonix  PT/OT: Yes  Insulin: Yes    ???  Code Status: Full Code  Disposition/Family: Continue admission to med 2 with tentative DC tomorrow 12/24. Paged PT to work with patient today in preparation for discharge.    Mart Piggs, MD  PGY 3  [facility]  Pager [phone]    Patient seen and discussed with Dr. Orson Ape      ________________________________________________________________________    Subjective  Aibhlinn VERNELLE FLUET is a 61 y.o. female.  Patient seen at bedside. C/O tongue swelling and pain today with sensation of oral thrush. She also notes vaginal itching and is concerned for vaginal yeast infection. Denies abdominal pain, N/V, SOA. Has not been out of bed yet today. Feels that her BUE edema has improved. Notes some irritation of the central venous catheter.    Medications  Scheduled Meds:  aspirin chewable tablet 81 mg 81 mg Oral QDAY   cefTRIAXone (ROCEPHIN) IVP 1 g 1 g Intravenous Q24H*   doxycycline (VIBRAMYCIN) tablet 100 mg 100 mg Oral BID   fluconazole (DIFLUCAN) tablet 200 mg 200 mg Oral QDAY   folic acid (FOLVITE) tablet 1 mg 1  mg Oral QDAY   furosemide (LASIX) injection 40 mg 40 mg Intravenous ONCE   furosemide (LASIX) tablet 40 mg 40 mg Oral QDAY   heparin (porcine) PF syringe 5,000 Units 5,000 Units Subcutaneous Q8H   insulin aspart U-100 (NOVOLOG FLEXPEN) injection PEN 0-7 Units 0-7 Units Subcutaneous ACHS   insulin NPH (HUMULIN N KwikPen) injection PEN 14 Units 14 Units Subcutaneous QDAY(07)   levothyroxine (SYNTHROID) tablet 175 mcg 175 mcg Oral QDAY(07)   magnesium oxide (MAG-OX) tablet 400 mg 400 mg Oral BID   metoprolol tartrate (LOPRESSOR) tablet 50 mg 50 mg Oral BID   pantoprazole DR (PROTONIX) tablet 40 mg 40 mg Oral BID(11-21)   potassium chloride SR (K-DUR) tablet 20 mEq 20 mEq Oral QDAY(12)   tiZANidine (ZANAFLEX) tablet 4 mg 4 mg Oral QHS   vitamins, multiple tablet 1 tablet 1 tablet Oral QDAY   Continuous Infusions:  PRN and Respiratory Meds:traMADol Q6H PRN      Review of Systems:  Review of Systems  A comprehensive 10 point review of systems was reviewed and was negative except for the following (in bold):  Pertinent negatives are listed in non-bolded text  Constitional: Fever, chills, unintentional weight change, night sweats  Neuro: Headache, visual disturbance, weakness  CV: Chest pain/pressure, lightheadedness, edema  PULM: Shortness of breath, cough, hemoptysis  GI: Nausea, vomiting, abdominal pain, constipation, diarrhea  GU: Dysuria, polyuria Skin: Right CVC irritation         Objective:                          Vital Signs: Last Filed                 Vital Signs: 24 Hour Range   BP: 121/53 (12/23 1154)  Temp: 36.5 ???C (97.7 ???F) (12/23 1154)  Pulse: 85 (12/23 1154)  Respirations: 16 PER MINUTE (12/23 1154)  SpO2: 97 % (12/23 1154)  O2 Delivery: Nasal Cannula (12/23 1154) BP: (105-143)/(48-59)   Temp:  [36.4 ???C (97.6 ???F)-36.9 ???C (98.4 ???F)]   Pulse:  [66-102]   Respirations:  [16 PER MINUTE-22 PER MINUTE]   SpO2:  [97 %-99 %]   O2 Delivery: Nasal Cannula   Intensity Pain Scale (Self Report): Asleep (05/25/17 0435) Vitals:    05/23/17 0400 05/24/17 0600 05/25/17 0500   Weight: 127.2 kg (280 lb 6.8 oz) 132 kg (291 lb 0.1 oz) (!) 137.8 kg (303 lb 14.4 oz)       Intake/Output Summary:  (Last 24 hours)    Intake/Output Summary (Last 24 hours) at 05/25/2017 1234  Last data filed at 05/25/2017 0640  Gross per 24 hour   Intake 1200 ml   Output 375 ml   Net 825 ml      Stool Occurrence: 0    Physical Exam  General Appearance: Appears overweight  well nourished, stated age and in no distress  PSYCH: Normal behavior, goal directed thinking, normal speech (rate and volume)  HEENT: Normocephalic and atraumatic, mucous membranes moist. Tongue erythematous and swollen, no thrush visible.  Sclera anicteric bilaterally.   CV: S1 S2 RRR. No murmurs, rubs, clicks, gallops or S4. Distal extremities warm without cyanosis or clubbing. Radial pulses 2+ bilaterally and symmetric. No parasternal heaves.   PULM: Fine crackles to BL bases, no wheezes,  rhonchi, increased effort of breathing or diminished lung sounds. Speaks in complete sentences.  ABD: Soft, obese, NTTP  EXT: 2+ pitting edema to BLE and 1+ to BUE (  L>R). No obvious deformity to BLE.  SKIN: Warm and dry to touch. Pallor noted.   NEURO: Tone grossly normal, cranial nerves 2-12 grossly intact, tracks to voice. Alert and interactive.         Lab Review  24-hour labs: Results for orders placed or performed during the hospital encounter of 05/20/17 (from the past 24 hour(s))   POC GLUCOSE    Collection Time: 05/24/17  1:13 PM   Result Value Ref Range    Glucose, POC 112 (H) 70 - 100 MG/DL   POC GLUCOSE    Collection Time: 05/24/17  6:31 PM   Result Value Ref Range    Glucose, POC 113 (H) 70 - 100 MG/DL   POC GLUCOSE    Collection Time: 05/24/17  9:13 PM   Result Value Ref Range    Glucose, POC 138 (H) 70 - 100 MG/DL   POC GLUCOSE    Collection Time: 05/25/17  3:15 AM   Result Value Ref Range    Glucose, POC 189 (H) 70 - 100 MG/DL   PROTIME INR (PT)    Collection Time: 05/25/17  6:20 AM   Result Value Ref Range    INR 1.2 0.8 - 1.2   PHOSPHORUS    Collection Time: 05/25/17  6:20 AM   Result Value Ref Range    Phosphorus 2.5 2.0 - 4.5 MG/DL   MAGNESIUM    Collection Time: 05/25/17  6:20 AM   Result Value Ref Range    Magnesium 1.8 1.6 - 2.6 mg/dL   COMPREHENSIVE METABOLIC PANEL    Collection Time: 05/25/17  6:20 AM   Result Value Ref Range    Sodium 137 137 - 147 MMOL/L    Potassium 3.8 3.5 - 5.1 MMOL/L    Chloride 101 98 - 110 MMOL/L    Glucose 148 (H) 70 - 100 MG/DL    Blood Urea Nitrogen 33 (H) 7 - 25 MG/DL    Creatinine 1.61 (H) 0.4 - 1.00 MG/DL    Calcium 8.7 8.5 - 09.6 MG/DL    Total Protein 5.6 (L) 6.0 - 8.0 G/DL    Total Bilirubin 0.6 0.3 - 1.2 MG/DL    Albumin 2.6 (L) 3.5 - 5.0 G/DL    Alk Phosphatase 40 25 - 110 U/L    AST (SGOT) 13 7 - 40 U/L    CO2 34 (H) 21 - 30 MMOL/L    ALT (SGPT) 6 (L) 7 - 56 U/L    Anion Gap 2 (L) 3 - 12    eGFR Non African American 42 (L) >60 mL/min    eGFR African American 51 (L) >60 mL/min   CBC AND DIFF    Collection Time: 05/25/17  6:20 AM   Result Value Ref Range    White Blood Cells 3.8 (L) 4.5 - 11.0 K/UL    RBC 2.35 (L) 4.0 - 5.0 M/UL    Hemoglobin 7.4 (L) 12.0 - 15.0 GM/DL    Hematocrit 04.5 (L) 36 - 45 %    MCV 96.7 80 - 100 FL    MCH 31.5 26 - 34 PG    MCHC 32.5 32.0 - 36.0 G/DL    RDW 40.9 (H) 11 - 15 % Platelet Count 112 (L) 150 - 400 K/UL    MPV 8.3 7 - 11 FL    Neutrophils 60 41 - 77 %    Lymphocytes 22 (L) 24 - 44 %    Monocytes 10 4 - 12 %  Eosinophils 7 (H) 0 - 5 %    Basophils 1 0 - 2 %    Absolute Neutrophil Count 2.30 1.8 - 7.0 K/UL    Absolute Lymph Count 0.80 (L) 1.0 - 4.8 K/UL    Absolute Monocyte Count 0.40 0 - 0.80 K/UL    Absolute Eosinophil Count 0.20 0 - 0.45 K/UL    Absolute Basophil Count 0.00 0 - 0.20 K/UL   POC GLUCOSE    Collection Time: 05/25/17  7:44 AM   Result Value Ref Range    Glucose, POC 147 (H) 70 - 100 MG/DL       Point of Care Testing  (Last 24 hours)  Glucose: (!) 148 (05/25/17 0620)  POC Glucose (Download): (!) 147 (05/25/17 0744)    Radiology and other Diagnostics Review:    Pertinent radiology reviewed.    Mart Piggs, MD   Pager [phone]

## 2017-05-25 NOTE — Progress Notes
Shift: Day    Mentation: A&Ox4    Cardiac: Atrial flutter on tele, VSS    Respiratory: 2L, SOA on exertion    GI/GU: Voids per bedpan, last BM 12/21    Nutrition: Cardiac/ADA diet w/ pt to choose soft foods due to not having dentures with her (per SLP recommendation today)    Activity: x2 assist, bedrest. Attempted to sit at EOB, pt unable to sit up. Q2 turns.    Pain: Generalized discomfort d/t edema improving with lasix, no c/o pain    Family: Husband at bedside

## 2017-05-26 ENCOUNTER — Encounter: Admit: 2017-05-26 | Discharge: 2017-05-26 | Payer: MEDICARE

## 2017-05-26 DIAGNOSIS — K31819 Angiodysplasia of stomach and duodenum without bleeding: ICD-10-CM

## 2017-05-26 DIAGNOSIS — D649 Anemia, unspecified: ICD-10-CM

## 2017-05-26 DIAGNOSIS — I8500 Esophageal varices without bleeding: ICD-10-CM

## 2017-05-26 DIAGNOSIS — K7460 Unspecified cirrhosis of liver: ICD-10-CM

## 2017-05-26 DIAGNOSIS — R0600 Dyspnea, unspecified: Principal | ICD-10-CM

## 2017-05-26 DIAGNOSIS — K2270 Barrett's esophagus without dysplasia: ICD-10-CM

## 2017-05-26 LAB — POC GLUCOSE
Lab: 119 mg/dL — ABNORMAL HIGH (ref 70–100)
Lab: 139 mg/dL — ABNORMAL HIGH (ref 70–100)
Lab: 148 mg/dL — ABNORMAL HIGH (ref 70–100)
Lab: 157 mg/dL — ABNORMAL HIGH (ref 70–100)
Lab: 168 mg/dL — ABNORMAL HIGH (ref 70–100)

## 2017-05-26 LAB — COMPREHENSIVE METABOLIC PANEL: Lab: 137 MMOL/L — ABNORMAL LOW (ref 137–147)

## 2017-05-26 LAB — HIV 1& 2 AG-AB SCRN W REFLEX HIV 1 PCR QUANT

## 2017-05-26 LAB — IGG SUBCLASSES
Lab: 100
Lab: 33
Lab: 994 — ABNORMAL HIGH
Lab: 387

## 2017-05-26 LAB — CULTURE-BLOOD W/SENSITIVITY

## 2017-05-26 LAB — ANTI-MITOCHONDRIAL ANTIBODY

## 2017-05-26 LAB — HEPATITIS A IGG: Lab: 9.8 — ABNORMAL HIGH

## 2017-05-26 LAB — CERULOPLASMIN: Lab: 25 pg (ref 26–34)

## 2017-05-26 LAB — PHOSPHORUS: Lab: 2.6 mg/dL — ABNORMAL LOW (ref >60)

## 2017-05-26 LAB — MAGNESIUM: Lab: 1.9 mg/dL — ABNORMAL LOW (ref >60)

## 2017-05-26 LAB — ANTI-SMOOTH MUSCLE AB

## 2017-05-26 LAB — CBC AND DIFF: Lab: 3.4 K/UL — ABNORMAL LOW (ref 4.5–11.0)

## 2017-05-26 LAB — ANTI-NUCLEAR ANTIBODY(ANA)

## 2017-05-26 MED ORDER — FUROSEMIDE 10 MG/ML IJ SOLN
40 mg | Freq: Once | INTRAVENOUS | 0 refills | Status: CP
Start: 2017-05-26 — End: ?
  Administered 2017-05-26: 09:00:00 40 mg via INTRAVENOUS

## 2017-05-26 NOTE — Progress Notes
At approx 1600 This RN informed Dr. Eda Keys that pt does have PIV in the L forearm. R IJ is still in placed d/t pt transfer from OSH and no record of xray report found.

## 2017-05-26 NOTE — Progress Notes
OCCUPATIONAL THERAPY  PROGRESS NOTE    Patient Name: Diane Savage                   Room/Bed: NW2956/21  Admitting Diagnosis:  Resp failure      Mobility  Progressive Mobility Level: Active transfer to chair  Level of Assistance: Assist X2  Assistive Device: Hand Held;Walker  Time Tolerated: 11-30 minutes  Activity Limited By: Fatigue;Weakness    Subjective  Pertinent Dx per Physician:  PMH CAD s/p CABG 02/2017, HF, afib s/p L atrial appendage ligation not on AC d/t recurrent GIBs c/b transfusion-dependent anemia, liver disease c/b portal HTN, congestive colopathy, EV, GAVE & ascites. Admitted as transfer 12/18 after presenting there on 12/16 for syncope/fatigue. Admit c/b AMS/respiratory failure requiring intubation, afib w/ RVR & septic shock from HCAP + UTI. Extubated 12/19.  Precautions: Falls;O2 Requirement  Comments: 2L O2 NC  Pain / Complaints: Patient agrees to participate in therapy    Objective  Psychosocial Status: Willing and Cooperative to Participate  Persons Present: Physical Therapist    Home Living  Type of Home: House  Home Layout: One Level;Ramped Environmental education officer / Tub: Pensions consultant: Environmental consultant    Prior Function  Level Of Independence: Independent with ADLs and functional transfers  Lives With: Spouse  Receives Help From: None Needed  Other Function Comments: Pt reports 2 recent falls in the bathroom, falling while trying to transfer off the toilet.    ADL's  Where Assessed: Edge of Bed;Chair  LE Dressing Assist: Total Assist  LE Dressing Deficits: Don/Doff R Sock;Don/Doff L Sock  Functional Transfer Assist: Minimal Assist(x2)  Functional Transfer Deficits: Supervision/Safety;Steadying  Comment: Supine to EOB minimal assist pull to sit. Total assist donn  BLE socks seated EOB. Initial sit to stand with hand held minimal assist x2 pt able to side step to bed side chair. Pt returns from bedside chair to EOB with minimal assist and use of roller walker. Therapy sets up additional bed side chair to allow pt more upright position. Second transfer from EOB to bedside chair minimal assist with use of roller walker. Pt remains upright in bedside chair with all needs met and chair alarm activated.     Cognition  Overall Cognitive Status: WFL to Adequately Complete Self Care Tasks Safely  Memory: WFL Adequate to Recall Day to Day Activities  Orientation: Alert & Oriented x4  Attention: Awake/Alert    Assessment  Assessment: Decreased ADL Status;Decreased UE Strength;Decreased Safe/Judg during ADL;Decreased Cognition;Decreased Endurance;Decreased Self-Care Trans;Decreased Fine Motor Coordination;Decreased High-Level ADLs  Goal Formulation: Patient  Comments: Pt limited this date by weakness however demonstrates improved mental status and functional mobility. Second person standby assist for safety 2/2 pt reports knees buckle at random times.     AM-PAC 6 Clicks Daily Activity Inpatient  Putting on and taking off regular lower body clothes?: Total  Bathing (Including washing, rinsing, drying): A Lot  Toileting, which includes using toilet, bedpan, or urinal: Total  Putting on and taking off regular upper body clothing: A Lot  Taking care of personal grooming such as brushing teeth: None  Eating meals?: None  Daily Activity Raw Score: 14  Standardized (t-scale) score: 33.39  CMS 0-100% Score: 59.67  CMS G Code Modifier: CK    Plan  OT Frequency: 5x/week  OT Plan for Next Visit: Progress independence in funcitonal mobility, toietling with clothing mangement and BSC    ADL Goals  Patient Will Perform Grooming: at [facility]  of Bed;in Chair;w/ Stand By Assist  Patient Will Perform LE Dressing: w/ Minimum Assist  Patient Will Perform Toileting: w/ Minimum Assist    Functional Transfer Goals  Pt Will Perform All Functional Transfers: Minimum Assist    OT Discharge Recommendations  OT Discharge Recommendations: Inpatient Setting Equipment Recommendations: Too early to be determined    Therapist: Dineen Kid, OTR/L [id number]  Date: 05/26/2017

## 2017-05-26 NOTE — Progress Notes
Order received to remove the right IJ acute care catheter, okay for the floor nurse to remove, this is not a tunneled line. Hassan Rowan RN advised.

## 2017-05-26 NOTE — Progress Notes
Heart Failure Nursing Progress Note    Admission Date: 05/20/2017  LOS: 6 days    Admission Weight: 132 kg (291 lb 0.1 oz)        Most recent weights (inpatient):   Vitals:    05/24/17 0600 05/25/17 0500 05/26/17 0520   Weight: 132 kg (291 lb 0.1 oz) (!) 137.8 kg (303 lb 14.4 oz) (!) 136.3 kg (300 lb 6.4 oz)     Weight change from previous day: Less 3 lbs    Fluid restriction ordered: No    Intake/Output Summary: (Last 24 hours)    Intake/Output Summary (Last 24 hours) at 05/26/2017 4132  Last data filed at 05/26/2017 4401  Gross per 24 hour   Intake 480 ml   Output 1375 ml   Net -895 ml       Is patient incontinent No    Anticipated discharge date: TBD  Discharge goals: Keep extra fluids off      Daily Assessment of Patient Stated Goals:    Short Term Goal Identified by patient (Short Term=during hospitalization):  Less SOA with activities      Shift: Night    Cardiac: Afib to SR on tele monitor    Respiratory: SOA with exertion    Nutrition: Tolerating po. NPO since MN.    Activity:  Turned and repositioned q2hr    Pain: Denies pain    GI/GU: No reported stool this shift    Family: Spouse at bedside    Follow up: Continue plan of care

## 2017-05-26 NOTE — Discharge Instructions - Appointments
Please keep previously scheduled follow up

## 2017-05-26 NOTE — Progress Notes
SPEECH-LANGUAGE PATHOLOGY  NO TREATMENT NOTE     Per EMR and discussion with RN, pt may d/c today. RN reports good toleration of PO diet and no difficulty swallowing medications. Speech therapy will sign off at this time.        Therapist: Lavada Mesi , L/CCC-SLP (Pager [phone]; [phone])  Date: 05/26/2017

## 2017-05-26 NOTE — Progress Notes
CLINICAL NUTRITION                                                        Clinical Nutrition Assessment Summary     NAME:Anagha MCKYNLIE DIPIETRANTONIO             MRN: [id number]             DOB:[birth year]          AGE: 61 y.o.  ADMISSION DATE: 05/20/2017             DAYS ADMITTED: LOS: 6 days    Nutrition Assessment of Patient:  BMI Categories Adult: Obesity Class III: 40 and over(47.23 current wt)  Unintentional Weight Loss: (remains above usual body weight range)  Malnutrition Assessment: Does not meet criteria  Current Oral Intake: Marginally Adequate  Estimated Calorie Needs: 4540-9811(91-47 kcal/kg desired wt of 65.8 kg)  Estimated Protein Needs: 79(1.2 g/kg desired wt of 65.8 kg)  Oral Diet Order: NPO    Comments:  61 yo F with hx including 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 She was transferred from [facility] on 12/18 after presenting there on 12/16 for syncope/fatigue. Admit c/b AMS/respiratory failure requiring intubation, afib w/ RVR & septic shock from HCAP + UTI. Has since been extubated (12/19 AM), shock resolved. Diuresis resumed.     SLP cleared pt for regular diet and thin liquids. Pt was briefly on EN while intubated, now tolerating a regular diet. She reports she still doesn't have great appetite. She had 25% of an omelet for breakfast. For lunch she ordered apple, banana, custard, and jello. Pt asked about hard boiled eggs, RD overrided menu to allow for pt to have hard boiled eggs on cardiac diet. PTA she reported her appetite has generally been low and poor for several weeks. Claims to only have had jello most days and reports usual routine of chewing on ice throughout the day (2/2 IDA).  Admits to not following any diet guidelines or self-monitoring fluid intakes, etc. Also admits to not weighing self at home 2/2 frequent doctor visits/weights there. RD encouraged cardiac diet emphasizing low sodium and diabetic diet. Pt reports eating several high sodium foods and eating out frequently. 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 Per RN pt was asking for salt from husband yesterday. Pt has BLE pitting +2 and BLE non pitting edema. RD discussed importance and encouraged better compliance. Denies any GI distress though does tend toward constipation; +BM 12/22. Endorses usual weight range closer to 240-250# (admit wt listed at 291# and most recently300#). No pressure injuries noted. Will likely benefit from ongoing diet rationale/education for reinforcement once.    Clinical Nutrition Education Summary    Education on Low Sodium Diet was provided.  Topics included the following:   ? Indications for diet  ? Sodium limit of 2,000 milligrams daily  ? Foods recommended/not recommended  ? Sodium content of foods (examples)  ? Reading food labels  ? Food preparation suggestions  ? Sodium-free seasonings  ? Sample meals and snacks  ?  Eating out tips    Written materials were provided.     Learner(s): patient   Learner's Response:     ??? Verbalized understanding of key dietary guidelines taught.    Recommendation:  Please reinforce 2000 mg sodium restriction as pt is likely noncompliant     Intervention / Plan:  RD encourage oral intakes. RD discussed DM and low sodium diet  RD will monitor tolerance and adequacy of PO intakes  RD will monitor wts, labs, meds, and GI function    Nutrition Diagnosis:  Undesirable food choices  Etiology: likely high sodium intake  Signs & Symptoms: diet recall    Goals:  EN tolerated and meeting >85% of nutritional needs  Time Frame: Within 48 Hours  Status: no longer appropriate;new goal established  Transition from enteral to oral  Time Frame: Within 5 Days Status: Met  Patient to consume >50% of meals/supplements  Time Frame: Within 5 Days     Eston Mould, RD, LD  [phone]

## 2017-05-26 NOTE — Progress Notes
PHYSICAL THERAPY  PROGRESS NOTE       MOBILITY:  Mobility  Progressive Mobility Level: Active transfer to chair  Level of Assistance: Assist X2  Assistive Device: Hand Held;Walker  Time Tolerated: 11-30 minutes  Activity Limited By: Fatigue;Weakness    SUBJECTIVE:  Subjective  Significant hospital events: PMH significant for CAD s/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 She was transferred from [facility] on 12/18 after presenting there on 12/16 for syncope/fatigue. Admit c/b AMS/respiratory failure requiring intubation, afib w/ RVR & septic shock from HCAP + UTI. On arrival, NE was able to be stopped & she was extubated 12/19 AM. Transfer to floor on 12/22.  Mental / Cognitive Status: Alert;Oriented;Cooperative;Follows Commands  Persons Present: Occupational Therapist  Pain: Patient has no complaint of pain  Pain Interventions: Patient agrees to participate in therapy  Comments: 2L O2 NC    Ambulation Assist: Independent Mobility at [employer] Level with Device  Patient Owned Equipment: Nurse, adult  Home Situation: Lives with Family  Type of Home: House  Entry Stairs: Ramp  In-Home Stairs: No Stairs  Comments: Reports being able to get around house but with SOA. Was lately just put on 2L 02 NC at home a week ago.  Fell off toilet in bathroom.  Transfer from [employer].    BED MOBILITY/TRANSFERS:  Bed Mobility/Transfers  Bed Mobility: Supine to Sit: Minimal Assist;Head of Bed Elevated;Use of Rail  Comments: Sitting up in bedside chair with TABS alarm activated and call light within reach.     Transfer Type: Sit to/from Stand  Transfer: Assistance Level: From;Bed;To;Bed Side Chair;Minimal Assist;x2 People  Transfer: Assistive Device: None Transfers: Type Of Assistance: For Balance;For Strength Deficit;For Safety Considerations    Other Transfer Type: Sit to/from Stand  Other Transfer: Assistance Level: Bed;To/From;Bed Side Chair;Minimal Assist  Other Transfer: Assistive Device: Nurse, adult  Other Transfer: Type Of Assistance: For Balance;For Strength Deficit;For Safety Considerations    End Of Activity Status: Up in Chair;Nursing Notified;Instructed Patient to Request Assist with Mobility;Instructed Patient to Use Call Light    ASSESSMENT/PROGRESS:  Assessment/Progress  Impaired Mobility Due To: Decreased Strength;Decreased Activity Tolerance;Deconditioning;Medical Status Limitation  Assessment/Progress: Should Improve w/ Continued PT  Comments: Patient demonstrates improved mobility; however, continues to be limited by decreased strength, balance, and activity tolerance. Will benefit from ongoing therapy.     AM-PAC 6 Clicks Basic Mobility Inpatient  Turning from your back to your side while in a flat bed without using bed rails: A Little  Moving from lying on your back to sitting on the side of a flatbed without using bedrails : A Little  Moving to and from a bed to a chair (including a wheelchair): A Little  Standing up from a chair using your arms (e.g. wheelchair, or bedside chair): A Little  To walk in hospital room: A Lot  Climbing 3-5 steps with a railing: Total  Raw Score: 15  Standardized (T-scale) Score: 36.97  Basic Mobility CMS 0-100%: 50.4  CMS G Code Modifier for Basic Mobility: CK    GOALS:  Goals  Goal Formulation: With Patient  Time For Goal Achievement: 5 days  Pt Will Go Supine To/From Sit: w/ Minimal Assist, Met, New Goal, w/ Stand By Assist  Pt Will Transfer Bed/Chair: w/ Stand By Assist, Ongoing  Pt Will Transfer  Sit to Stand: w/ Stand By Assist, Ongoing  Pt Will Ambulate: 1-10 Feet, w/ Dan Humphreys, w/ Minimal Assist, Ongoing    PLAN:  Plan   Treatment Interventions: Mobility Training;Strengthening Plan Frequency: 5 Days per Week  PT Plan for Next Visit: Pre-gait training, standing balance/tolerance, increase time out of bed.     RECOMMENDATIONS:  PT Discharge Recommendations  PT Discharge Recommendations: Inpatient Setting    Therapist: Charlsie Merles, [place], [place] [address]   Date: 05/26/2017

## 2017-05-26 NOTE — Progress Notes
RT Adult Assessment Note    NAME:Diane Savage             MRN: [id number]             DOB:1956-03-05          AGE: 61 y.o.  ADMISSION DATE: 05/20/2017             DAYS ADMITTED: LOS: 6 days    RT Treatment Plan:       Protocol Plan: Procedures  Oxygen/Humidity: O2 to keep SpO2 > 92%  Monitoring: Pulse oximetry BID & PRN    Vital Signs:  Pulse: Pulse: 82  RR: Respirations: 18 PER MINUTE  SpO2: SpO2: 96 %  O2 Device: $$ O2 Device: Cannula  Liter Flow: O2 Liter Flow: 2 lpm  O2%:    Breath Sounds:    Respiratory Effort: Respiratory Effort: Non-Labored

## 2017-05-26 NOTE — Progress Notes
General Progress Note    Name:  Diane Savage   [id number] Date:  05/26/2017  Admission Date: 05/20/2017  LOS: 6 days                     Assessment/Plan:    Active Problems:    Type 2 diabetes mellitus with circulatory disorder, without long-term current use of insulin (HCC)    PAF (paroxysmal atrial fibrillation) (HCC)    Atrial fibrillation with rapid ventricular response (HCC)    Chronic gastrointestinal bleeding    HLD (hyperlipidemia)    Essential hypertension    Acute on chronic diastolic heart failure due to coronary artery disease (HCC)    Morbid obesity (HCC)      Interval:  - Continue doxy and rocephin through 12/23 for HCAP and e.coli UTI  - Extra IV lasix 40mg  given twice, goal net negative 1-2L in 24h  - Removal of CVC and placement of PIV to be done by IV team  - Diflucan 200mg  x 7d (12/23 - ) for oral thrush and vaginal yeast infxn  - PT on call paged to request assessment and therapy today 12/24    NEURO  Acute Encephalopathy (resolved)  - developed AMS the day of admit to OSH in the setting of septic shock  -???CT head (OSH): no acute process  - ammonia level 95 (12/19)  - on exam, pt is alert and answers on 12/23  PLAN  -???continue PTA???tizanidine and tramadol  ???  PULM  Acute Hypercarbic???& Hypoxemic???Respiratory???Failure (Improving)  L Pleural Effusion  - d/t HCAP   -???CT chest (OSH):???circumferential???L???pleural effusion???&???densely consolidated???L???base???w/???air bronchograms  - extubated 12/19  - currently on 3 L/NC, O2 sat 98% as of 12/23, was discharged home on 10/25 on room air from HF service, but states she had 2L O2 prior to admission  PLAN  - Continue PO lasix 40mg  daily  - Will give an additional IV lasix 40mg  twice 12/23-24  ???  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,???moderate TR, moderate LA???enlargement, +???intra-atrial PFO with bidirectional shunt, s/p left atrial appendage ligation - repeat echo 05/21/17: LVEF 50-55%, no regional wall motion abnormalities; RV mildly dilated but function probably normal; mild to moderate MR; mild to moderate TR; peak PAP 41 mmHg; no pericardial effusion  - EKG:???no ST changes  - troponin negative x 2  - BNP 247 (prior 1,178)  - PTA lasix 40 mg daily, but then given IVF when hypotensive overnight  PLAN  -???continue PTA ASA & lasix  - Additional IV lasix 40mg  once 12/23  Septic Shock???(resolved)  - arrived to [facility] on pressor, but essentially stopped immediately  -???central venous O2 sat 84%  - hypotensive overnight and briefly on dopamine (d/t bradycardia) -- off since ~ 0200  - SBP 100-140s  Bradycardia; 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  -  follows with???[facility]  - EGD???10/15: 3 columns of small???EV,???portal HTN gastropathy &???mild GAVE  - hgb 8.5 on 12/21 --> stable ~7.4  - plt 107  PLAN  -???continue???PTA PPI  Liver Disease  -???c/b???portal???HTN, congestive colopathy, EV,???GAVE???&???ascites  - limited prior w/u; was to see hepatology in clinic in January  - [place] 02/2017: diffuse hepatic steatosis  - EGD as above  - MELD-Na 10  Dysphagia  - Moderate oropharyngeal dysphagia per SLP  - Corpak be removed and she be allowed to eat; pt aware of risk of aspiration  - SLP recs 12/22: Continue regular solids, thin liquids  ???  RENAL  AKI on CKD (resolved)  - baseline Cr 1.3  - Cr 2.4 on arrival at OSH --> 1.3 this AM 12/23  - Will avoid nephrotoxins, renally dose meds, strict I/Os  ???  ENDO  DM2 -???PTA regimen:???NPH 14 units Q AM, novolog 4 units TID  - BS 110-160s last 24 hrs  PLAN  - continue 14U NPH daily + low dose correction factor  Hypothyroidism  -???TSH???1.99 (12/18)  PLAN  -???continue???PTA???synthroid daily  ???  ID  Septic Shock  HCAP  UTI  - WBC???3.9;???afebrile  - hx???resistant e.coli UTI  - per OSH micro tech, no blood cxs were drawn  - urine cx (OSH): + e.coli (resitant to cefazolin, ampicillin, levaquin, augmentin,???cipro; sensitive to  merrem, ceftriaxone & bactrim)  - BAL 12/17???(OSH): usual upper respiratory flora, candida species  -???CT chest (OSH): dense L base consolidation  - blood cx???12/18: NGTD  - sputum cx 12/18: GS w/o organisms; cx NGTD  - UA???12/18: 1+ leuks, + nitrites, 20-50 WBC, few bacteria  - urine cx???12/19: < 100,000 candida albicans  - strep pneumo and legionella urine Ags negative  - procalcitonin???0.22 (12/18)  - no suitable pocket of ascites (on bedside US) for a paracentesis   PLAN  - continue ceftriaxone and doxycycline x 7 days (through 12/23, stop date ordered)  ???  FEN  - Regular solids, thin liquids, swallow precautions  - tube feedings: dc'd  - IVF: none  - review electrolytes and replace as needed  ???  PPX:  Lines: DC CVC on 12/23 and will place PIV  Drains/Tubes: None, corpack dc'd 12/22  Indwelling Urinary Catheter: No  DVT: SCDs; Heparin  GI: Protonix  PT/OT: Yes  Insulin: Yes    Code Status: Full Code  Disposition/Family: Continue admission to med 2 with tentative DC tomorrow 12/24. Paged PT to work with patient today in preparation for discharge.    Patient seen and discussed with Dr. Orson Ape  ???  Bridget Hartshorn  PGY-1  Pager [phone]     ________________________________________________________________________    Subjective  Diane Savage is a 61 y.o. female.  No events overnight. Patient says SOB and edema are improving mildly. Tongue pain and vaginal itching unchanged from yesterday. Patient has been up on the side of bed, but not OOB. Reports being on 2L O2 prior to admission, currently also on 2L O2. Patient is eager to d/c. Discussed disposition with patient, it is possible that she may need to stay inpatient, but would prefer to leave if possible.     Medications  Scheduled Meds:    aspirin chewable tablet 81 mg 81 mg Oral QDAY   fluconazole (DIFLUCAN) tablet 200 mg 200 mg Oral QDAY   folic acid (FOLVITE) tablet 1 mg 1 mg Oral QDAY   furosemide (LASIX) tablet 40 mg 40 mg Oral QDAY   insulin aspart U-100 (NOVOLOG FLEXPEN)  injection PEN 0-7 Units 0-7 Units Subcutaneous ACHS   insulin NPH (HUMULIN N KwikPen) injection PEN 14 Units 14 Units Subcutaneous QDAY(07)   levothyroxine (SYNTHROID) tablet 175 mcg 175 mcg Oral QDAY(07)   magnesium oxide (MAG-OX) tablet 400 mg 400 mg Oral BID   metoprolol tartrate (LOPRESSOR) tablet 50 mg 50 mg Oral BID   pantoprazole DR (PROTONIX) tablet 40 mg 40 mg Oral BID(11-21)   potassium chloride SR (K-DUR) tablet 20 mEq 20 mEq Oral QDAY(12)   tiZANidine (ZANAFLEX) tablet 4 mg 4 mg Oral QHS   vitamins, multiple tablet 1 tablet 1 tablet Oral QDAY   Continuous Infusions:  PRN and Respiratory Meds:traMADol Q6H PRN      Review of Systems:  Review of Systems  A comprehensive 10 point review of systems was reviewed and was negative except for the following (in bold):  Pertinent negatives are listed in non-bolded text  Constitional: Fever, chills, unintentional weight change, night sweats  Neuro: Headache, visual disturbance, weakness  CV: Chest pain/pressure, lightheadedness, edema  PULM: Shortness of breath, cough, hemoptysis  GI: Nausea, vomiting, abdominal pain, constipation, diarrhea  GU: Dysuria, polyuria  Skin: Right CVC irritation         Objective:                          Vital Signs: Last Filed                 Vital Signs: 24 Hour Range   BP: 126/54 (12/24 0843)  Temp: 36.7 ???C (98 ???F) (12/24 4540)  Pulse: 81 (12/24 0843)  Respirations: 20 PER MINUTE (12/24 0843)  SpO2: 98 % (12/24 0843) O2 Delivery: None (Room Air) (12/24 0843) BP: (104-127)/(47-67)   Temp:  [36.3 ???C (97.4 ???F)-36.7 ???C (98 ???F)]   Pulse:  [81-99]   Respirations:  [16 PER MINUTE-20 PER MINUTE]   SpO2:  [97 %-100 %]   O2 Delivery: None (Room Air)     Vitals:    05/24/17 0600 05/25/17 0500 05/26/17 0520   Weight: 132 kg (291 lb 0.1 oz) (!) 137.8 kg (303 lb 14.4 oz) (!) 136.3 kg (300 lb 6.4 oz)       Intake/Output Summary:  (Last 24 hours)    Intake/Output Summary (Last 24 hours) at 05/26/2017 1106  Last data filed at 05/26/2017 9811  Gross per 24 hour   Intake ???   Output 1150 ml   Net -1150 ml      Stool Occurrence: 0    Physical Exam  General Appearance: Appears overweight  well nourished, stated age and in no distress  PSYCH: Normal behavior, goal directed thinking, normal speech (rate and volume)  HEENT: Normocephalic and atraumatic, mucous membranes moist. Tongue erythematous and swollen, no thrush visible.  Sclera anicteric bilaterally.   CV: S1 S2 RRR. No murmurs, rubs, clicks, gallops or S4. Distal extremities warm without cyanosis or clubbing. Radial pulses 2+ bilaterally and symmetric. No parasternal heaves.   PULM: Fine crackles at BL anterior bases, no wheezes,  rhonchi, increased effort of breathing or diminished lung sounds. Speaks in complete sentences.  ABD: Soft, obese, NTTP  EXT: 2+ pitting edema to BLE and 1+ to BUE (L>R). No obvious deformity to BLE.  SKIN: Warm and dry to touch. Pallor noted.   NEURO: Tone grossly normal, cranial nerves 2-12 grossly intact, tracks to voice. Alert and interactive.         Lab Review  24-hour labs:  Results for orders placed or performed during the hospital encounter of 05/20/17 (from the past 24 hour(s))   POC GLUCOSE    Collection Time: 05/25/17  4:34 PM   Result Value Ref Range    Glucose, POC 131 (H) 70 - 100 MG/DL   BASIC METABOLIC PANEL    Collection Time: 05/25/17  5:00 PM   Result Value Ref Range    Sodium 136 (L) 137 - 147 MMOL/L    Potassium 3.7 3.5 - 5.1 MMOL/L Chloride 100 98 - 110 MMOL/L    CO2 33 (H) 21 - 30 MMOL/L    Anion Gap 3 3 - 12    Glucose 139 (H) 70 - 100 MG/DL    Blood Urea Nitrogen 32 (H) 7 - 25 MG/DL    Creatinine 0.98 (H) 0.4 - 1.00 MG/DL    Calcium 9.0 8.5 - 11.9 MG/DL    eGFR Non African American 46 (L) >60 mL/min    eGFR African American 56 (L) >60 mL/min   MAGNESIUM    Collection Time: 05/25/17  5:00 PM   Result Value Ref Range    Magnesium 1.8 1.6 - 2.6 mg/dL   POC GLUCOSE    Collection Time: 05/25/17  9:11 PM   Result Value Ref Range    Glucose, POC 139 (H) 70 - 100 MG/DL   POC GLUCOSE    Collection Time: 05/26/17  3:10 AM   Result Value Ref Range    Glucose, POC 168 (H) 70 - 100 MG/DL   PHOSPHORUS    Collection Time: 05/26/17  3:24 AM   Result Value Ref Range    Phosphorus 2.6 2.0 - 4.5 MG/DL   MAGNESIUM    Collection Time: 05/26/17  3:24 AM   Result Value Ref Range    Magnesium 1.9 1.6 - 2.6 mg/dL   COMPREHENSIVE METABOLIC PANEL    Collection Time: 05/26/17  3:24 AM   Result Value Ref Range    Sodium 137 137 - 147 MMOL/L    Potassium 4.3 3.5 - 5.1 MMOL/L    Chloride 101 98 - 110 MMOL/L    Glucose 194 (H) 70 - 100 MG/DL    Blood Urea Nitrogen 32 (H) 7 - 25 MG/DL    Creatinine 1.47 (H) 0.4 - 1.00 MG/DL    Calcium 8.6 8.5 - 82.9 MG/DL    Total Protein 5.5 (L) 6.0 - 8.0 G/DL    Total Bilirubin 0.4 0.3 - 1.2 MG/DL    Albumin 2.6 (L) 3.5 - 5.0 G/DL    Alk Phosphatase 38 25 - 110 U/L    AST (SGOT) 14 7 - 40 U/L    CO2 34 (H) 21 - 30 MMOL/L    ALT (SGPT) 6 (L) 7 - 56 U/L    Anion Gap 2 (L) 3 - 12    eGFR Non African American 46 (L) >60 mL/min    eGFR African American 55 (L) >60 mL/min   CBC AND DIFF    Collection Time: 05/26/17  3:24 AM   Result Value Ref Range    White Blood Cells 3.4 (L) 4.5 - 11.0 K/UL    RBC 2.27 (L) 4.0 - 5.0 M/UL    Hemoglobin 7.1 (L) 12.0 - 15.0 GM/DL    Hematocrit 56.2 (L) 36 - 45 %    MCV 95.7 80 - 100 FL    MCH 31.3 26 - 34 PG    MCHC 32.7 32.0 - 36.0 G/DL    RDW 13.0 (H)  11 - 15 %    Platelet Count 107 (L) 150 - 400 K/UL MPV 8.3 7 - 11 FL    Neutrophils 62 41 - 77 %    Lymphocytes 20 (L) 24 - 44 %    Monocytes 11 4 - 12 %    Eosinophils 6 (H) 0 - 5 %    Basophils 1 0 - 2 %    Absolute Neutrophil Count 2.20 1.8 - 7.0 K/UL    Absolute Lymph Count 0.70 (L) 1.0 - 4.8 K/UL    Absolute Monocyte Count 0.40 0 - 0.80 K/UL    Absolute Eosinophil Count 0.20 0 - 0.45 K/UL    Absolute Basophil Count 0.00 0 - 0.20 K/UL   POC GLUCOSE    Collection Time: 05/26/17  7:52 AM   Result Value Ref Range    Glucose, POC 157 (H) 70 - 100 MG/DL       Point of Care Testing  (Last 24 hours)  Glucose: (!) 194 (05/26/17 0324)  POC Glucose (Download): (!) 157 (05/26/17 3086)    Radiology and other Diagnostics Review:    Pertinent radiology reviewed.    Rosezena Sensor, MD   Pager [phone]

## 2017-05-27 LAB — POC GLUCOSE
Lab: 127 mg/dL — ABNORMAL HIGH (ref 70–100)
Lab: 110 mg/dL — ABNORMAL HIGH (ref 70–100)
Lab: 103 mg/dL — ABNORMAL HIGH (ref 70–100)
Lab: 106 mg/dL — ABNORMAL HIGH (ref 70–100)

## 2017-05-27 LAB — BASIC METABOLIC PANEL: Lab: 135 MMOL/L — ABNORMAL LOW (ref >60)

## 2017-05-27 LAB — HEMOGLOBIN: Lab: 8.8 g/dL — ABNORMAL LOW (ref 12.0–15.0)

## 2017-05-27 LAB — TROPONIN-I: Lab: 0 ng/mL (ref 0.0–0.05)

## 2017-05-27 MED ORDER — DEXTROMETHORPHAN-GUAIFENESIN 10-100 MG/5 ML PO SYRP
10 mL | ORAL | 0 refills | Status: DC | PRN
Start: 2017-05-27 — End: 2017-06-06
  Administered 2017-05-27: 10:00:00 10 mL via ORAL

## 2017-05-27 MED ORDER — ONDANSETRON HCL (PF) 4 MG/2 ML IJ SOLN
4 mg | INTRAVENOUS | 0 refills | Status: DC | PRN
Start: 2017-05-27 — End: 2017-05-27
  Administered 2017-05-27: 12:00:00 4 mg via INTRAVENOUS

## 2017-05-27 MED ORDER — ACETAMINOPHEN 500 MG PO TAB
500 mg | Freq: Once | ORAL | 0 refills | Status: CP
Start: 2017-05-27 — End: ?
  Administered 2017-05-27: 15:00:00 500 mg via ORAL

## 2017-05-27 MED ORDER — FUROSEMIDE 10 MG/ML IJ SOLN
40 mg | Freq: Once | INTRAVENOUS | 0 refills | Status: CP
Start: 2017-05-27 — End: ?
  Administered 2017-05-27: 10:00:00 40 mg via INTRAVENOUS

## 2017-05-27 MED ORDER — HEPARIN, PORCINE (PF) 5,000 UNIT/0.5 ML IJ SYRG
5000 [IU] | SUBCUTANEOUS | 0 refills | Status: DC
Start: 2017-05-27 — End: 2017-06-06
  Administered 2017-05-27 – 2017-06-06 (×30): 5000 [IU] via SUBCUTANEOUS

## 2017-05-27 MED ORDER — SENNOSIDES 8.6 MG PO TAB
2 | Freq: Every evening | ORAL | 0 refills | Status: DC
Start: 2017-05-27 — End: 2017-05-28
  Administered 2017-05-28: 03:00:00 2 via ORAL

## 2017-05-27 MED ORDER — PROCHLORPERAZINE MALEATE 10 MG PO TAB
5-10 mg | ORAL | 0 refills | Status: DC | PRN
Start: 2017-05-27 — End: 2017-06-06
  Administered 2017-05-27 – 2017-05-28 (×2): 5 mg via ORAL

## 2017-05-27 NOTE — Progress Notes
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   -Nausea: Added compazine, d/c zofran.     NEURO  Acute Encephalopathy (resolved)  - developed AMS the day of admit to OSH in the setting of septic shock  -???CT head (OSH): no acute process  - ammonia level 95 (12/19)  - on exam, pt is alert and answers on 12/23  PLAN  -???continue PTA???tizanidine and tramadol  ???  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,???moderate TR, moderate LA???enlargement, +???intra-atrial PFO with bidirectional shunt, s/p left atrial appendage ligation  - repeat echo 05/21/17: LVEF 50-55%, no regional wall motion abnormalities; RV mildly dilated but function probably normal; mild to moderate MR; mild to moderate TR; peak PAP 41 mmHg; no pericardial effusion  - EKG:???no ST changes  - troponin negative x 2 - BNP 247 (prior 1,178)  - PTA lasix 40 mg daily, but then given IVF when hypotensive overnight  PLAN  -???continue PTA ASA & lasix  - Additional IV lasix 40mg  once 12/23  Septic Shock???(resolved)  - arrived to [place] on pressor, but essentially stopped immediately  -???central venous O2 sat 84%  - hypotensive overnight and briefly on dopamine (d/t bradycardia) -- off since ~ 0200  - SBP 100-140s  Bradycardia; 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 pt aware of risk of aspiration  - SLP recs 12/22: Continue regular solids, thin liquids  ???  RENAL  AKI on CKD (resolved)  - baseline Cr 1.3  - Cr 2.4 on arrival at OSH --> 1.3 this AM 12/23  - Will avoid nephrotoxins, renally dose meds, strict I/Os  ???  ENDO  DM2  -???PTA regimen:???NPH 14 units Q AM, novolog 4 units TID  - BS 110-160s last 24 hrs  PLAN  - continue 14U NPH daily + low dose correction factor  Hypothyroidism  -???TSH???1.99 (12/18)  PLAN  -???continue???PTA???synthroid daily  ???  ID  Septic Shock  HCAP  UTI  - WBC???3.9;???afebrile - hx???resistant e.coli UTI  - per OSH micro tech, no blood cxs were drawn  - urine cx (OSH): + e.coli (resitant to cefazolin, ampicillin, levaquin, augmentin,???cipro; sensitive to  merrem, ceftriaxone & bactrim)  - BAL 12/17???(OSH): usual upper respiratory flora, candida species  -???CT chest (OSH): dense L base consolidation  - blood cx???12/18: NGTD  - sputum cx 12/18: GS w/o organisms; cx NGTD  - UA???12/18: 1+ leuks, + nitrites, 20-50 WBC, few bacteria  - urine cx???12/19: < 100,000 candida albicans  - strep pneumo and legionella urine Ags negative  - procalcitonin???0.22 (12/18)  - no suitable pocket of ascites (on bedside US) for a paracentesis   PLAN  - continue ceftriaxone and doxycycline x 7 days (through 12/23, stop date ordered)  ???  FEN  - Regular solids, thin liquids, swallow precautions  - tube feedings: dc'd  - IVF: none  - review electrolytes and replace as needed  ???  PPX:  Lines: PIV  Drains/Tubes: None, corpack dc'd 12/22  Indwelling Urinary Catheter: No  DVT: SCDs; Heparin  GI: Protonix  PT/OT: Yes  Insulin: Yes    Code Status: Full Code  Disposition/Family: Continue admission to med 2   Patient seen and discussed with Dr. Orson Ape  ???  Bridget Hartshorn  PGY-1  Pager [phone]     ________________________________________________________________________    Subjective  Diane Savage is a 61 y.o. female.  Patient had an episode of chest pain last night, 7 out of 10, mid chest, worse with inspiration, associated shortness of breath.  Patient also with nausea, cough, retching.  Stated pain radiated to back and left shoulder, no pain on right.  Vital signs are stable.  EKG and troponins were ordered, returning atrial fibrillation without evidence of ST change, troponin 0.01.  Was assessed this morning, patient continued to have nausea.  Patient agreeable with staying for admission given chest pain.  Would like to work further with PT/OT, however not here today, would like to work with PT OT tomorrow.  Shortness of breath improved, near baseline.  Patient notes improved edema.      Medications  Scheduled Meds:    aspirin chewable tablet 81 mg 81 mg Oral QDAY   fluconazole (DIFLUCAN) tablet 200 mg 200 mg Oral QDAY   folic acid (FOLVITE) tablet 1 mg 1 mg Oral QDAY   furosemide (LASIX) tablet 40 mg 40 mg Oral QDAY   insulin aspart U-100 (NOVOLOG FLEXPEN) injection PEN 0-7 Units 0-7 Units Subcutaneous ACHS   insulin NPH (HUMULIN N KwikPen) injection PEN 14 Units 14 Units Subcutaneous QDAY(07)   levothyroxine (SYNTHROID) tablet 175 mcg 175 mcg Oral QDAY(07)   magnesium oxide (MAG-OX) tablet 400 mg  400 mg Oral BID   metoprolol tartrate (LOPRESSOR) tablet 50 mg 50 mg Oral BID   pantoprazole DR (PROTONIX) tablet 40 mg 40 mg Oral BID(11-21)   potassium chloride SR (K-DUR) tablet 20 mEq 20 mEq Oral QDAY(12)   senna (SENOKOT) tablet 2 tablet 2 tablet Oral QHS   tiZANidine (ZANAFLEX) tablet 4 mg 4 mg Oral QHS   vitamins, multiple tablet 1 tablet 1 tablet Oral QDAY   Continuous Infusions:  PRN and Respiratory Meds:dextromethorphan/guaiFENesin Q4H PRN, ondansetron (ZOFRAN) IV Q6H PRN, prochlorperazine Q6H PRN, traMADol Q6H PRN      Review of Systems:  Constitutional: negative  Eyes: negative  Ears, nose, mouth, throat, and face: negative  Respiratory: Positive for cough  Cardiovascular: Positive for chest pain  Gastrointestinal: Positive for nausea, retching.  Genitourinary:negative  Integument/breast: negative  Neurologic: Negative  Objective:                          Vital Signs: Last Filed                 Vital Signs: 24 Hour Range   BP: 131/70 (12/25 1144)  Temp: 36.6 ???C (97.9 ???F) (12/25 1144)  Pulse: 84 (12/25 1144)  Respirations: 18 PER MINUTE (12/25 1144)  SpO2: 94 % (12/25 1144)  O2 Delivery: Nasal Cannula (12/25 1144) BP: (114-137)/(44-72)   Temp:  [36.4 ???C (97.6 ???F)-36.8 ???C (98.3 ???F)]   Pulse:  [82-104]   Respirations:  [18 PER MINUTE-24 PER MINUTE]   SpO2:  [93 %-99 %]   O2 Delivery: Nasal Cannula Intensity Pain Scale (Self Report): 5 (05/27/17 0356) Vitals:    05/26/17 0520 05/27/17 0315 05/27/17 0544   Weight: (!) 136.3 kg (300 lb 6.4 oz) (!) 136.5 kg (301 lb) 135.2 kg (298 lb 1.6 oz)       Intake/Output Summary:  (Last 24 hours)    Intake/Output Summary (Last 24 hours) at 05/27/2017 1449  Last data filed at 05/27/2017 0800  Gross per 24 hour   Intake 480 ml   Output 800 ml   Net -320 ml      Stool Occurrence: 0    Physical Exam  General Appearance: Appears overweight  well nourished, stated age and in no distress  PSYCH: Normal behavior, goal directed thinking, normal speech (rate and volume)  HEENT: Normocephalic and atraumatic, mucous membranes moist. Tongue erythematous and swollen, no thrush visible.  Sclera anicteric bilaterally.   CV: S1 S2 RRR. No murmurs, rubs, clicks, gallops or S4. Distal extremities warm without cyanosis or clubbing. Radial pulses 2+ bilaterally and symmetric. No parasternal heaves.   PULM: Fine crackles at BL anterior bases, no wheezes,  rhonchi, increased effort of breathing or diminished lung sounds. Speaks in complete sentences.  ABD: Soft, obese, NTTP  EXT: 2+ pitting edema to BLE and 1+ to BUE (L>R). No obvious deformity to BLE.  SKIN: Warm and dry to touch. Pallor noted.   NEURO: Tone grossly normal, cranial nerves 2-12 grossly intact, tracks to voice. Alert and interactive.     Lab Review  24-hour labs:    Results for orders placed or performed during the hospital encounter of 05/20/17 (from the past 24 hour(s))   POC GLUCOSE    Collection Time: 05/26/17  5:52 PM   Result Value Ref Range    Glucose, POC 119 (H) 70 - 100 MG/DL   POC GLUCOSE    Collection Time: 05/26/17  8:45 PM   Result Value Ref  Range    Glucose, POC 127 (H) 70 - 100 MG/DL   POC GLUCOSE    Collection Time: 05/27/17  3:15 AM   Result Value Ref Range    Glucose, POC 110 (H) 70 - 100 MG/DL   HEMOGLOBIN    Collection Time: 05/27/17  4:45 AM   Result Value Ref Range    Hemoglobin 8.8 (L) 12.0 - 15.0 GM/DL BASIC METABOLIC PANEL    Collection Time: 05/27/17  4:45 AM   Result Value Ref Range    Sodium 135 (L) 137 - 147 MMOL/L    Potassium 4.1 3.5 - 5.1 MMOL/L    Chloride 99 98 - 110 MMOL/L    CO2 32 (H) 21 - 30 MMOL/L    Anion Gap 4 3 - 12    Glucose 111 (H) 70 - 100 MG/DL    Blood Urea Nitrogen 30 (H) 7 - 25 MG/DL    Creatinine 4.01 (H) 0.4 - 1.00 MG/DL    Calcium 9.2 8.5 - 02.7 MG/DL    eGFR Non African American 48 (L) >60 mL/min    eGFR African American 58 (L) >60 mL/min   TROPONIN-I    Collection Time: 05/27/17  4:45 AM   Result Value Ref Range    Troponin-I 0.01 0.0 - 0.05 NG/ML   POC GLUCOSE    Collection Time: 05/27/17  8:08 AM   Result Value Ref Range    Glucose, POC 103 (H) 70 - 100 MG/DL   POC GLUCOSE    Collection Time: 05/27/17 12:40 PM   Result Value Ref Range    Glucose, POC 106 (H) 70 - 100 MG/DL       Point of Care Testing  (Last 24 hours)  Glucose: (!) 111 (05/27/17 0445)  POC Glucose (Download): (!) 106 (05/27/17 1240)    Radiology and other Diagnostics Review:    Pertinent radiology reviewed.    Rosezena Sensor, MD   Pager [phone]

## 2017-05-27 NOTE — Progress Notes
Pt requesting medication for cough. Unable to sleep. MD paged at [facility]. Received orders from Dr. Arnette Felts at [phone] for dextromethorphan/guaifenesin 67mL oral syrup Q4H PRN      0545--Pt complaining of severe nausea and dry heaving. MD paged. Received orders from Dr. Arnette Felts at [phone] for IV Zofran 4mg  Q6H

## 2017-05-27 NOTE — Progress Notes
RT will complete exercise oximetry within 24-48 hours prior to discharge. No planned discharge date at this time.

## 2017-05-27 NOTE — Progress Notes
At approx 0910 Pt R IJ was remvoed without any problem. Catheter tip intact. Site with minimal bleeding. Place 4x4 dressing and tegaderm over the site. Pt tolerated well. Will cont to monitor.

## 2017-05-28 ENCOUNTER — Encounter: Admit: 2017-05-28 | Discharge: 2017-05-28 | Payer: MEDICARE

## 2017-05-28 LAB — CBC
Lab: 124 10*3/uL — ABNORMAL LOW (ref 150–400)
Lab: 16 % — ABNORMAL HIGH (ref 11–15)
Lab: 2.7 M/UL — ABNORMAL LOW (ref 4.0–5.0)
Lab: 27 % — ABNORMAL LOW (ref 36–45)
Lab: 31 pg (ref 26–34)
Lab: 32 g/dL (ref 32.0–36.0)
Lab: 4.6 10*3/uL (ref 4.5–11.0)
Lab: 8.5 FL (ref 7–11)
Lab: 8.7 g/dL — ABNORMAL LOW (ref 12.0–15.0)
Lab: 97 FL (ref 80–100)

## 2017-05-28 LAB — POC GLUCOSE
Lab: 113 mg/dL — ABNORMAL HIGH (ref 70–100)
Lab: 127 mg/dL — ABNORMAL HIGH (ref 70–100)
Lab: 159 mg/dL — ABNORMAL HIGH (ref 70–100)
Lab: 173 mg/dL — ABNORMAL HIGH (ref 70–100)
Lab: 94 mg/dL (ref 70–100)

## 2017-05-28 LAB — MAGNESIUM: Lab: 1.8 mg/dL (ref 1.6–2.6)

## 2017-05-28 LAB — BASIC METABOLIC PANEL
Lab: 136 MMOL/L — ABNORMAL LOW (ref 137–147)
Lab: 4.3 MMOL/L — ABNORMAL LOW (ref 3.5–5.1)
Lab: 98 MMOL/L (ref 98–110)

## 2017-05-28 MED ORDER — DEXTRAN 70-HYPROMELLOSE 0.1-0.3 % OP DROP
1 [drp] | Freq: Four times a day (QID) | OPHTHALMIC | 0 refills | Status: DC
Start: 2017-05-28 — End: 2017-06-06
  Administered 2017-05-29: 04:00:00 1 [drp] via OPHTHALMIC

## 2017-05-28 MED ORDER — FLUCONAZOLE 200 MG PO TAB
200 mg | Freq: Every day | ORAL | 0 refills | Status: CN
Start: 2017-05-28 — End: ?

## 2017-05-28 MED ORDER — SODIUM CHLORIDE 0.65 % NA SPRA
1-2 | NASAL | 0 refills | Status: DC | PRN
Start: 2017-05-28 — End: 2017-06-06
  Administered 2017-05-29: 04:00:00 2 via NASAL

## 2017-05-28 MED ORDER — SENNOSIDES-DOCUSATE SODIUM 8.6-50 MG PO TAB
1 | Freq: Two times a day (BID) | ORAL | 0 refills | Status: DC
Start: 2017-05-28 — End: 2017-05-28

## 2017-05-28 MED ORDER — ACETAMINOPHEN 325 MG PO TAB
650 mg | ORAL | 0 refills | Status: DC | PRN
Start: 2017-05-28 — End: 2017-06-06
  Administered 2017-05-31 – 2017-06-05 (×6): 650 mg via ORAL

## 2017-05-28 MED ORDER — SENNOSIDES-DOCUSATE SODIUM 8.6-50 MG PO TAB
2 | Freq: Two times a day (BID) | ORAL | 0 refills | Status: DC
Start: 2017-05-28 — End: 2017-06-03
  Administered 2017-05-29 – 2017-06-03 (×11): 2 via ORAL

## 2017-05-28 MED ORDER — IMS MIXTURE TEMPLATE
60 mg | Freq: Every day | ORAL | 0 refills | Status: DC
Start: 2017-05-28 — End: 2017-05-29
  Administered 2017-05-29 (×2): 60 mg via ORAL

## 2017-05-28 MED ORDER — BISACODYL 10 MG RE SUPP
10 mg | Freq: Once | RECTAL | 0 refills | Status: CP
Start: 2017-05-28 — End: ?
  Administered 2017-05-29: 10 mg via RECTAL

## 2017-05-28 MED ORDER — DEXTRAN 70-HYPROMELLOSE (PF) 0.1-0.3 % OP DPET
1 [drp] | Freq: Four times a day (QID) | OPHTHALMIC | 0 refills | Status: DC
Start: 2017-05-28 — End: 2017-05-28

## 2017-05-28 MED ORDER — POLYETHYLENE GLYCOL 3350 17 GRAM PO PWPK
1 | Freq: Two times a day (BID) | ORAL | 0 refills | Status: DC | PRN
Start: 2017-05-28 — End: 2017-06-06

## 2017-05-28 NOTE — Progress Notes
OCCUPATIONAL THERAPY  NOTE    Occupational therapy with two attempts to see pt this date, pt with meal tray in AM and working with PT in PM. OT will continue to follow pt and provide interventions as indicated.     Cheral Marker, OTR/L [phone]

## 2017-05-28 NOTE — Progress Notes
Shift: Night    Mentation: A/O x4    Cardiac: S1, S2, SR on tele    Respiratory: 3L NC, SOA on exertion    GI/GU: voids, last BM: 12/22    Nutrition: cardiac/ADA diet    Activity: x2 assist with walker, hasn't been wanting to get out of bed much, need to encourage    Pain: c/o back pain    Family: husband present at bedside    Follow up: Plan of care is on going

## 2017-05-28 NOTE — Progress Notes
Heart Failure Nursing Progress Note    Admission Date: 05/20/2017  LOS: 8 days    Admission Weight: 132 kg (291 lb 0.1 oz)        Most recent weights (inpatient):   Vitals:    05/27/17 0315 05/27/17 0544 05/28/17 0600   Weight: (!) 136.5 kg (301 lb) 135.2 kg (298 lb 1.6 oz) 133.8 kg (295 lb)     Weight change from previous day: - 3lbs 1.6oz    Fluid restriction ordered: none    Intake/Output Summary: (Last 24 hours)    Intake/Output Summary (Last 24 hours) at 05/28/2017 1610  Last data filed at 05/28/2017 0400  Gross per 24 hour   Intake 100 ml   Output 350 ml   Net -250 ml       Is patient incontinent No    Anticipated discharge date: ongoing  Discharge goals: decrease fluid      Daily Assessment of Patient Stated Goals:    Short Term Goal Identified by patient (Short Term=during hospitalization):  Get moving more

## 2017-05-28 NOTE — Progress Notes
PHYSICAL THERAPY  PROGRESS NOTE       SUBJECTIVE:  Reason for admission: respiratory failure - medically complex patient PMH: CAD, GI bleed, CABG, heart failure, afib, anemia, HTN, DM, CKD, hypothyroidism, morbid obesity, liver disease, HTN, congestive colopathy, ascites  Mental / Cognitive Status: Alert;Cooperative;Follows Commands  Pain: Patient has no complaint of pain  Ambulation Assist: Independent Mobility at [employer] Level with Device  Patient Owned Equipment: Nurse, adult  Home Situation: Lives with Family  Type of Home: House  Entry Stairs: Ramp  In-Home Stairs: No Stairs    BED MOBILITY/TRANSFERS:  Supine to Sit: Standby Assist;Verbal Cues;Head of Bed Elevated;Use of Rail;Requires Extra Time  Sit to Supine: Standby Assist;Verbal Cues;Bed Flat;Use of Rail;Requires Extra Time  Transfer Type: Sit to/from Stand  Transfer: Assistance Level: Bed;To/From;Bed Side Chair;Standby Assist  Transfer: Assistive Device: Nurse, adult  Transfers: Type Of Assistance: Materials engineer;Requires Extra Time    EXERCISE:  Sit to stand and bed to/from chair was practiced multiple times.  A walker was used and cues were provided for technique.  Patient was polite but also adamant that she was too weak to attempt to walk.    PROGRESS:  Weakness, impaired balance and self limiting behavior have impaired her mobility.    AM-PAC 6 Clicks Basic Mobility Inpatient  Turning from your back to your side while in a flat bed without using bed rails: A Little  Moving from lying on your back to sitting on the side of a flatbed without using bedrails : A Little  Moving to and from a bed to a chair (including a wheelchair): A Little  Standing up from a chair using your arms (e.g. wheelchair, or bedside chair): A Little  To walk in hospital room: A Lot  Climbing 3-5 steps with a railing: Total  Raw Score: 15  Standardized (T-scale) Score: 36.97  Basic Mobility CMS 0-100%: 50.4  CMS G Code Modifier for Basic Mobility: CK    GOALS: Formulation: With Patient  Time For Goal Achievement: 5 days  Pt Will Go Supine To/From Sit: w/ Minimal Assist, Met, New Goal, w/ Stand By Assist  Pt Will Transfer Bed/Chair: w/ Stand By Assist, Ongoing  Pt Will Transfer Sit to Stand: w/ Stand By Assist, Ongoing  Pt Will Ambulate: 1-10 Feet, w/ Dan Humphreys, w/ Minimal Assist, Ongoing    PLAN:  Treatment Interventions: Mobility Training;Strengthening;Balance Activities  Plan Frequency: 5 Days per Week    Practice transfers.  Practice gait with a wheelchair follow.    RECOMMENDATIONS:  PT Discharge Recommendations: Inpatient Setting  Recommend ongoing assistance for: In and out of house;Transfers;Bed mobility;Ambulation;Stairs    Therapist: Arville Go, PT  Date: 05/28/2017

## 2017-05-28 NOTE — Progress Notes
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 Changed senna to senna/docusate and added MiraLax and 1x bicosadyl enema.     NEURO  Acute Encephalopathy (resolved)  - developed AMS the day of admit to OSH in the setting of septic shock  -???CT head (OSH): no acute process  - ammonia level 95 (12/19)  - on exam, pt is alert and answers on 12/23  PLAN  -???continue PTA???tizanidine and tramadol  ???  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,???moderate TR, moderate LA???enlargement, +???intra-atrial PFO with bidirectional shunt, s/p left atrial appendage ligation  - repeat echo 05/21/17: LVEF 50-55%, no regional wall motion abnormalities; RV mildly dilated but function probably normal; mild to moderate MR; mild to moderate TR; peak PAP 41 mmHg; no pericardial effusion  - EKG:???no ST changes  - troponin negative x 2 - BNP 247 (prior 1,178)  - PTA lasix 40 mg daily, but then given IVF when hypotensive overnight  PLAN  -???continue PTA ASA  -Increase  - Additional IV lasix 40mg  once 12/23  Septic Shock???(resolved)  - arrived to [facility] on pressor, but essentially stopped immediately  -???central venous O2 sat 84%  - hypotensive overnight and briefly on dopamine (d/t bradycardia) -- off since ~ 0200  - SBP 100-140s  Bradycardia; 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 pt aware of risk of aspiration  - SLP recs 12/22: Continue regular solids, thin liquids  Constipation  -On senna 2tab qHS  -Increased Bowel regimen 12/26:senna/docusate and added MiraLax and 1x bicosadyl enema.     ???  RENAL  AKI on CKD (resolved)  - baseline Cr 1.3  - Cr 2.4 on arrival at OSH --> 1.3 this AM 12/23  - Will avoid nephrotoxins, renally dose meds, strict I/Os  ???  ENDO  DM2  -???PTA regimen:???NPH 14 units Q AM, novolog 4 units TID  - BS 110-160s last 24 hrs  PLAN  - continue 14U NPH daily + low dose correction factor Hypothyroidism  -???TSH???1.99 (12/18)  PLAN  -???continue???PTA???synthroid daily  ???  ID  Septic Shock  HCAP  UTI  - WBC???3.9;???afebrile  - hx???resistant e.coli UTI  - per OSH micro tech, no blood cxs were drawn  - urine cx (OSH): + e.coli (resitant to cefazolin, ampicillin, levaquin, augmentin,???cipro; sensitive to  merrem, ceftriaxone & bactrim)  - BAL 12/17???(OSH): usual upper respiratory flora, candida species  -???CT chest (OSH): dense L base consolidation  - blood cx???12/18: NGTD  - sputum cx 12/18: GS w/o organisms; cx NGTD  - UA???12/18: 1+ leuks, + nitrites, 20-50 WBC, few bacteria  - urine cx???12/19: < 100,000 candida albicans  - strep pneumo and legionella urine Ags negative  - procalcitonin???0.22 (12/18)  - no suitable pocket of ascites (on bedside US) for a paracentesis   PLAN  - continue ceftriaxone and doxycycline x 7 days (through 12/23, stop date ordered)  ???  FEN  - Regular solids, thin liquids, swallow precautions  - tube feedings: dc'd  - IVF: none  - review electrolytes and replace as needed  ???    DVT PPx: SCDs; Heparin  Code Status: Full Code  Disposition/Family: Continue admission to med 2  Patient seen and discussed with Dr. Orson Ape  ???  Bridget Hartshorn  PGY-1  Pager [phone]     ________________________________________________________________________    Subjective  Diane Savage is a 61 y.o. female.  NAEON. Patient says that she started having abdominal pain and nausea, no vomiting that started around 7:00 this AM. She is passing gas, but has not had BM over the past 4 days. Would like bowel regimen at this time. Patient also wants to work with mobility with PT/OT today. Also endorsing dry eyes, as well as dry nasal passages while on O2.       Medications  Scheduled Meds:    aspirin chewable tablet 81 mg 81 mg Oral QDAY   fluconazole (DIFLUCAN) tablet 200 mg 200 mg Oral QDAY   folic acid (FOLVITE) tablet 1 mg 1 mg Oral QDAY   furosemide (LASIX) tablet 40 mg 40 mg Oral QDAY heparin (porcine) PF syringe 5,000 Units 5,000 Units Subcutaneous Q8H   insulin aspart U-100 (NOVOLOG FLEXPEN) injection PEN 0-7 Units 0-7 Units Subcutaneous ACHS   insulin NPH (HUMULIN N KwikPen) injection PEN 14 Units 14 Units Subcutaneous QDAY(07)   levothyroxine (SYNTHROID) tablet 175 mcg 175 mcg Oral QDAY(07)   magnesium oxide (MAG-OX) tablet 400 mg 400 mg Oral BID   metoprolol tartrate (LOPRESSOR) tablet 50 mg 50 mg Oral BID   pantoprazole DR (PROTONIX) tablet 40 mg 40 mg Oral BID(11-21)   potassium chloride SR (K-DUR) tablet 20 mEq 20 mEq Oral QDAY(12)  senna (SENOKOT) tablet 2 tablet 2 tablet Oral QHS   tiZANidine (ZANAFLEX) tablet 4 mg 4 mg Oral QHS   vitamins, multiple tablet 1 tablet 1 tablet Oral QDAY   Continuous Infusions:  PRN and Respiratory Meds:dextromethorphan/guaiFENesin Q4H PRN, prochlorperazine Q6H PRN, traMADol Q6H PRN      Review of Systems:  Constitutional: negative  Eyes: negative  Ears, nose, mouth, throat, and face: negative  Respiratory: Positive for cough  Cardiovascular: Negative  Gastrointestinal: Positive for Abd pain, nausea, retching.  Genitourinary:negative  Integument/breast: negative  Neurologic: Negative  Objective:                          Vital Signs: Last Filed                 Vital Signs: 24 Hour Range   BP: 101/55 (12/26 0455)  Temp: 36.7 ???C (98 ???F) (12/26 0455)  Pulse: 81 (12/26 0455)  Respirations: 18 PER MINUTE (12/26 0455)  SpO2: 98 % (12/26 0455)  O2 Delivery: Nasal Cannula (12/26 0455) BP: (101-151)/(55-70)   Temp:  [36.6 ???C (97.8 ???F)-36.7 ???C (98.1 ???F)]   Pulse:  [81-105]   Respirations:  [18 PER MINUTE]   SpO2:  [93 %-99 %]   O2 Delivery: Nasal Cannula   Intensity Pain Scale (Self Report): 5 (05/27/17 2114) Vitals:    05/26/17 0520 05/27/17 0315 05/27/17 0544   Weight: (!) 136.3 kg (300 lb 6.4 oz) (!) 136.5 kg (301 lb) 135.2 kg (298 lb 1.6 oz)       Intake/Output Summary:  (Last 24 hours)    Intake/Output Summary (Last 24 hours) at 05/28/2017 1610 Last data filed at 05/28/2017 0400  Gross per 24 hour   Intake 100 ml   Output 350 ml   Net -250 ml      Stool Occurrence: 0    Physical Exam  General Appearance: Appears overweight  well nourished, stated age and in no distress  PSYCH: Normal behavior, goal directed thinking, normal speech (rate and volume)  HEENT: Normocephalic and atraumatic, mucous membranes moist. Tongue erythematous and swollen, no thrush visible.  Sclera anicteric bilaterally.   CV: S1 S2 RRR. No murmurs, rubs, clicks, gallops or S4. Distal extremities warm without cyanosis or clubbing. Radial pulses 2+ bilaterally and symmetric. No parasternal heaves.   PULM: Fine crackles at BL anterior bases, no wheezes,  rhonchi, increased effort of breathing or diminished lung sounds. Speaks in complete sentences.  ABD: Soft, obese, TTP in RUQ, Epigastric regions.   EXT: 2+ pitting edema to BLE and 1+ to BUE (L>R). No obvious deformity to BLE.  SKIN: Warm and dry to touch. Pallor noted.   NEURO: Tone grossly normal, cranial nerves 2-12 grossly intact, tracks to voice. Alert and interactive.     Lab Review  24-hour labs:    Results for orders placed or performed during the hospital encounter of 05/20/17 (from the past 24 hour(s))   POC GLUCOSE    Collection Time: 05/27/17  8:08 AM   Result Value Ref Range    Glucose, POC 103 (H) 70 - 100 MG/DL   POC GLUCOSE    Collection Time: 05/27/17 12:40 PM   Result Value Ref Range    Glucose, POC 106 (H) 70 - 100 MG/DL   POC GLUCOSE    Collection Time: 05/27/17  6:17 PM   Result Value Ref Range    Glucose, POC 159 (H) 70 - 100 MG/DL   POC GLUCOSE  Collection Time: 05/27/17  9:14 PM   Result Value Ref Range    Glucose, POC 173 (H) 70 - 100 MG/DL       Point of Care Testing  (Last 24 hours)  POC Glucose (Download): (!) 173 (05/27/17 2114)    Radiology and other Diagnostics Review:    Pertinent radiology reviewed.    Rosezena Sensor, MD   Pager [phone]

## 2017-05-28 NOTE — Progress Notes
Heart Failure Nursing Progress Note    Admission Date: 05/20/2017  LOS: 7 days    Admission Weight: 132 kg (291 lb 0.1 oz)        Most recent weights (inpatient):   Vitals:    05/26/17 0520 05/27/17 0315 05/27/17 0544   Weight: (!) 136.3 kg (300 lb 6.4 oz) (!) 136.5 kg (301 lb) 135.2 kg (298 lb 1.6 oz)     Weight change from previous day: -1.1 kg    Fluid restriction ordered: None    Intake/Output Summary: (Last 24 hours)    Intake/Output Summary (Last 24 hours) at 05/27/2017 1944  Last data filed at 05/27/2017 1100  Gross per 24 hour   Intake 480 ml   Output 975 ml   Net -495 ml       Is patient incontinent No    Anticipated discharge date: Plan ongoing  Discharge goals: Safe discharge from mobility standpoint      Daily Assessment of Patient Stated Goals:    Short Term Goal Identified by patient (Short Term=during hospitalization): none identified

## 2017-05-28 NOTE — Progress Notes
At this RN's request, Physical Therapy performed standing weight. Pt unable to balance on standing scale d/t body habitus and size of scale.

## 2017-05-29 LAB — POC GLUCOSE
Lab: 136 mg/dL — ABNORMAL HIGH (ref 70–100)
Lab: 104 mg/dL — ABNORMAL HIGH (ref 70–100)
Lab: 111 mg/dL — ABNORMAL HIGH (ref 70–100)
Lab: 106 mg/dL — ABNORMAL HIGH (ref 70–100)

## 2017-05-29 LAB — BASIC METABOLIC PANEL: Lab: 135 MMOL/L — ABNORMAL LOW (ref >60)

## 2017-05-29 LAB — CBC: Lab: 4 K/UL — ABNORMAL LOW (ref >60)

## 2017-05-29 LAB — MAGNESIUM: Lab: 1.8 mg/dL — ABNORMAL LOW (ref >60)

## 2017-05-29 MED ORDER — HYDROXYZINE HCL 25 MG PO TAB
50 mg | Freq: Once | ORAL | 0 refills | Status: CP
Start: 2017-05-29 — End: ?
  Administered 2017-05-29: 19:00:00 50 mg via ORAL

## 2017-05-29 MED ORDER — MAGNESIUM SULFATE IN D5W 1 GRAM/100 ML IV PGBK
1 g | INTRAVENOUS | 0 refills | Status: CP
Start: 2017-05-29 — End: ?
  Administered 2017-05-29 (×2): 1 g via INTRAVENOUS

## 2017-05-29 MED ORDER — BISACODYL 10 MG RE SUPP
10 mg | Freq: Once | RECTAL | 0 refills | Status: CP
Start: 2017-05-29 — End: ?
  Administered 2017-05-29: 21:00:00 10 mg via RECTAL

## 2017-05-29 MED ORDER — FUROSEMIDE 10 MG/ML IJ SOLN
40 mg | Freq: Two times a day (BID) | INTRAVENOUS | 0 refills | Status: DC
Start: 2017-05-29 — End: 2017-06-02
  Administered 2017-05-29 – 2017-06-02 (×8): 40 mg via INTRAVENOUS

## 2017-05-29 NOTE — Progress Notes
OCCUPATIONAL THERAPY  PROGRESS NOTE    Patient Name: Diane Savage                   Room/Bed: ZO1096/04  Admitting Diagnosis:  Resp failure      Mobility  Progressive Mobility Level: Walk in room  Distance Walked (feet): 10 ft  Level of Assistance: Assist X1  Assistive Device: Walker  Activity Limited By: Fatigue, edema    Subjective  Pertinent Dx per Physician:  PMH CAD s/p CABG 02/2017, HF, afib s/p L atrial appendage ligation not on AC d/t recurrent GIBs c/b transfusion-dependent anemia, liver disease c/b portal HTN, congestive colopathy, EV, GAVE & ascites. Admitted as transfer 12/18 after presenting there on 12/16 for syncope/fatigue. Admit c/b AMS/respiratory failure requiring intubation, afib w/ RVR & septic shock from HCAP + UTI. Extubated 12/19.  Precautions: Falls;O2 Requirement  Comments: 2L O2 NC  Pain / Complaints: Patient agrees to participate in therapy    Home Living  Type of Home: House  Home Layout: One Level;Ramped Environmental education officer / Tub: Pensions consultant: Environmental consultant    Prior Function  Level Of Independence: Independent with ADLs and functional transfers  Lives With: Spouse  Receives Help From: None Needed  Other Function Comments: Pt reports 2 recent falls in the bathroom, falling while trying to transfer off the toilet.    ADL's  LE Dressing Assist: Total Assist (BLE too edematous to reach or bend legs)  LE Dressing Deficits: Don/Doff R Sock;Don/Doff L Sock  Toileting Assist: Moderate Assist (thoroughness)  Toileting Deficits: Perineal Hygiene;Bedside Commode (no LB clothing to manage)  Functional Transfer Assist: Moderate Assist (Supine to sit with HOB elevated)  Comment: Patient ambulates to the commode placed across the room using roller walker, contact guard assist. Cues to reach hands back for commode arm rest prior to sitting.    Activity Tolerance  Sitting Balance: 4/5 Moves/Returns Trunkal Midpoint 1-2 Inches in Multiple Planes    Cognition Overall Cognitive Status: WFL to Adequately Complete Self Care Tasks Safely    Assessment  Assessment: Decreased ADL Status;Decreased Endurance;Decreased Self-Care Trans;Decreased High-Level ADLs  Comment: Anticipate the more fluid comes off patient, the better she will tolerate activity. Patient requires assistance for most ADL's/fuinctional mobility at this time.    AM-PAC 6 Clicks Daily Activity Inpatient  Putting on and taking off regular lower body clothes?: Total  Bathing (Including washing, rinsing, drying): A Lot  Toileting, which includes using toilet, bedpan, or urinal: Total  Putting on and taking off regular upper body clothing: A Lot  Taking care of personal grooming such as brushing teeth: None  Eating meals?: None  Daily Activity Raw Score: 14  Standardized (t-scale) score: 33.39  CMS 0-100% Score: 59.67  CMS G Code Modifier: CK    Plan  OT Frequency: 5x/week  OT Plan for Next Visit: lower body dressing, walking endurance      ADL Goals  Patient Will Perform Grooming: at Edge of Bed;in Chair;w/ Stand By Assist  Patient Will Perform LE Dressing: w/ Minimum Assist  Patient Will Perform Toileting: w/ Minimum Assist    Functional Transfer Goals  Pt Will Perform All Functional Transfers: Minimum Assist        OT Discharge Recommendations  OT Discharge Recommendations: Inpatient Setting    Recommend ongoing assistance for: In and out of house, Transfers, Bed mobility, Ambulation, Stairs        Therapist: Theotis Burrow, [place] [id number]  Date: 05/29/2017

## 2017-05-29 NOTE — Progress Notes
PHYSICAL THERAPY  PROGRESS NOTE       MOBILITY:  Mobility  Progressive Mobility Level: Walk in room  Distance Walked (feet): 2 ft  Level of Assistance: Assist X1  Assistive Device: Walker  Time Tolerated: 11-30 minutes  Activity Limited By: Patient request to stop    SUBJECTIVE:  Subjective  Significant hospital events: Reason for admission: respiratory failure - medically complex patient PMH: CAD, GI bleed, CABG, heart failure, afib, anemia, HTN, DM, CKD, hypothyroidism, morbid obesity, liver disease, HTN, congestive colopathy, ascites  Mental / Cognitive Status: Alert;Cooperative  Pain: Patient has no complaint of pain  Ambulation Assist: Independent Mobility at Household Level with Device  Patient Owned Equipment: Nurse, adult  Home Situation: Lives with Family(lives with spouse who gets around in Art gallery manager)  Type of Home: House  Entry Stairs: Ramp  In-Home Stairs: No Stairs    BED MOBILITY/TRANSFERS:  Bed Mobility/Transfers  Bed Mobility: Supine to Sit: Minimal Assist;Head of Bed Elevated(1 hand hold assist with trunk)  Bed Mobility: Sit to Supine: Minimal Assist;Assist with L LE;Bed Flat;Use of Rail;Verbal Cues;Requires Extra Time  Transfer Type: Sit to/from Stand  Transfer: Assistance Level: To/From;Bed;Commode;Minimal Assist  Transfer: Assistive Device: Nurse, adult  Transfers: Type Of Assistance: Materials engineer;Requires Extra Time  End Of Activity Status: In Bed;Nursing Notified;Instructed Patient to Use Call Light  Comments: Pt required total assist for hygiene after voiding.  Pt declined attempting on her own due to stating she is too swollen.    GAIT:  Gait  Gait Distance: 2 feet(x2, during transfers - anticipate pt could walk further)  Gait: Assistance Level: Standby Assist  Gait: Assistive Device: Nurse, adult  Gait: Descriptors: Pace: Slow;Decreased step length  Comments: Pt initially agreed to trial ambulating to door, however after using commode and physicians were entering room, pt declined attempting any ambulation.    ASSESSMENT/PROGRESS:  Assessment/Progress  Impaired Mobility Due To: Decreased Activity Tolerance  AM-PAC 6 Clicks Basic Mobility Inpatient  Turning from your back to your side while in a flat bed without using bed rails: A Little  Moving from lying on your back to sitting on the side of a flatbed without using bedrails : A Little  Moving to and from a bed to a chair (including a wheelchair): A Little  Standing up from a chair using your arms (e.g. wheelchair, or bedside chair): A Little  To walk in hospital room: A Little  Climbing 3-5 steps with a railing: Total  Raw Score: 16  Standardized (T-scale) Score: 38.32  Basic Mobility CMS 0-100%: 47.12  CMS G Code Modifier for Basic Mobility: CK    GOALS:  Goals  Goal Formulation: With Patient  Time For Goal Achievement: 5 days  Pt Will Go Supine To/From Sit: w/ Minimal Assist, Met, New Goal, w/ Stand By Assist  Pt Will Transfer Bed/Chair: w/ Stand By Assist, Ongoing  Pt Will Transfer Sit to Stand: w/ Stand By Assist, Ongoing  Pt Will Ambulate: 1-10 Feet, w/ Dan Humphreys, w/ Minimal Assist, Ongoing    PLAN:  Plan   Treatment Interventions: Mobility Training;Strengthening;Balance Activities  Plan Frequency: 5 Days per Week  PT Plan for Next Visit: Progress ambulation distance, bed mobility with bed flat/no rail.    RECOMMENDATIONS:  PT Discharge Recommendations  PT Discharge Recommendations: Inpatient Setting    Therapist: London Pepper  Date: 05/29/2017

## 2017-05-29 NOTE — Progress Notes
Heart Failure Nursing Progress Note    Admission Date: 05/20/2017  LOS: 9 days    Admission Weight: 132 kg (291 lb 0.1 oz)        Most recent weights (inpatient):   Vitals:    05/27/17 0544 05/28/17 0600 05/29/17 0346   Weight: 135.2 kg (298 lb 1.6 oz) 133.8 kg (295 lb) 135.4 kg (298 lb 6.4 oz)     Weight change from previous day: + 3lbs 6.4oz    Fluid restriction ordered: none    Intake/Output Summary: (Last 24 hours)    Intake/Output Summary (Last 24 hours) at 05/29/2017 0981  Last data filed at 05/29/2017 0400  Gross per 24 hour   Intake 580 ml   Output 1000 ml   Net -420 ml       Is patient incontinent No    Anticipated discharge date: ongoing  Discharge goals: decrease fluid      Daily Assessment of Patient Stated Goals:    Short Term Goal Identified by patient (Short Term=during hospitalization):  Move more

## 2017-05-29 NOTE — Progress Notes
RT Adult Assessment Note    NAME:Diane Savage             MRN: [id number]             DOB:[birth year]          AGE: 61 y.o.  ADMISSION DATE: 05/20/2017             DAYS ADMITTED: LOS: 9 days    RT Treatment Plan:       Protocol Plan: Procedures  IPPB: Place a nursing order for "IS Q1h While Awake" for any of Lung Expansion indicators  Oxygen/Humidity: O2 to keep SpO2 > 92%  Monitoring: Pulse oximetry BID & PRN    Additional Comments:  Impressions of the patient: Patient in bed, awakes easily and is alert & oriented  Intervention(s)/outcome(s): Patient on 2 lpm at this time (home reg per pt) sat at 94%  Patient education that was completed: respiratory plan of care  Recommendations to the care team: Held exercise ox at this time d/t no discharge plan for patient at this time for today (ex ox needing to be completed 24-48 hours before discharge).  Will continue to follow patient and complete exercise ox when discharge plan for patient is within the time frame for it to be completed.  RN made aware as well of plan    Vital Signs:  Pulse: Pulse: 93  RR: Respirations: 18 PER MINUTE  SpO2: SpO2: 94 %  O2 Device: $$ O2 Device: Cannula  Liter Flow: O2 Liter Flow: 2 lpm  O2%:    Breath Sounds: All Breath Sounds: Clear (implies normal)  Respiratory Effort: Respiratory Effort: Non-Labored

## 2017-05-29 NOTE — Progress Notes
Heart Failure Nursing Progress Note    Admission Date: 05/20/2017  LOS: 8 days    Admission Weight: 132 kg (291 lb 0.1 oz)        Most recent weights (inpatient):   Vitals:    05/27/17 0315 05/27/17 0544 05/28/17 0600   Weight: (!) 136.5 kg (301 lb) 135.2 kg (298 lb 1.6 oz) 133.8 kg (295 lb)     Weight change from previous day: -1.4kg    Fluid restriction ordered: none    Intake/Output Summary: (Last 24 hours)    Intake/Output Summary (Last 24 hours) at 05/28/2017 1949  Last data filed at 05/28/2017 1923  Gross per 24 hour   Intake 100 ml   Output 650 ml   Net -550 ml       Is patient incontinent No    Anticipated discharge date: plan ongoing  Discharge goals: Mobility to baseline, diuresis      Daily Assessment of Patient Stated Goals:    Short Term Goal Identified by patient (Short Term=during hospitalization): increase mobility, have a BM

## 2017-05-29 NOTE — Progress Notes
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,???moderate TR, moderate LA???enlargement, +???intra-atrial PFO with bidirectional shunt, s/p left atrial appendage ligation  - repeat echo 05/21/17: LVEF 50-55%, no regional wall motion abnormalities; RV mildly dilated but function probably normal; mild to moderate MR; mild to moderate TR; peak PAP 41 mmHg; no pericardial effusion  - EKG:???no ST changes  - troponin negative x 2  - BNP 247 (prior 1,178)  - PTA lasix 40 mg daily, but then given IVF when hypotensive overnight  PLAN  -???continue PTA ASA    #Septic Shock???(resolved)  - arrived to [facility] on pressor, but essentially stopped immediately  -???central venous O2 sat 84%  - hypotensive overnight and briefly on dopamine (d/t bradycardia) -- off since ~ 0200 - SBP 100-140s    #Bradycardia; 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 pt aware of risk of aspiration  - SLP recs 12/22: Continue regular solids, thin liquids  #Constipation  -On senna 2tab qHS  -Increased Bowel regimen 12/26:senna/docusate and added MiraLax and 1x bicosadyl enema.     #  AKI on CKD (resolved)  - baseline Cr 1.3  - Cr 2.4 on arrival at OSH --> 1.3 this AM 12/23  - Will avoid nephrotoxins, renally dose meds, strict I/Os  ???  #DM2  -???PTA regimen:???NPH 14 units Q AM, novolog 4 units TID  - BS 110-160s last 24 hrs  PLAN  - continue 14U NPH daily + low dose correction factor  #Hypothyroidism  -???TSH???1.99 (12/18)  PLAN  -???continue???PTA???synthroid daily  ???  #Septic Shock (resolved)  #HCAP(resolved)  #UTI(resolved)  - WBC???3.9;???afebrile  - hx???resistant e.coli UTI  - per OSH micro tech, no blood cxs were drawn  - urine cx (OSH): + e.coli (resitant to cefazolin, ampicillin, levaquin, augmentin,???cipro; sensitive to  merrem, ceftriaxone & bactrim)  - BAL 12/17???(OSH): usual upper respiratory flora, candida species  -???CT chest (OSH): dense L base consolidation  - blood cx???12/18: NGTD  - sputum cx 12/18: GS w/o organisms; cx NGTD  - UA???12/18: 1+ leuks, + nitrites, 20-50 WBC, few bacteria  - urine cx???12/19: < 100,000 candida albicans  - strep pneumo and legionella urine Ags negative  - procalcitonin???0.22 (12/18)  - no suitable pocket of ascites (on bedside US) for a paracentesis   -ceftriaxone and doxycycline x 7 days (through 12/23, stop date ordered)  ???  FEN  - Regular solids, thin liquids, swallow precautions  - tube feedings: dc'd  - IVF: none  - review electrolytes and replace as needed  ???    DVT PPx: SCDs; Heparin  Code Status: Full Code  Disposition/Family: Continue admission to med 2  Patient seen and discussed with Dr. Orson Ape  ???  Bridget Hartshorn  PGY-1  Pager [phone]     ________________________________________________________________________    Subjective  Diane Savage is a 61 y.o. female.  No acute events overnight. States that she has not had any further episodes of chest or abdominal pain. Patient feels breathing is at baseline and unchanged. Had mild self-limited headache yesterday. Has had 2 small BMs since changing bowel regimen yesterday.  Continued edema, patient feels is improving. Patient states the she has been working with PT, but has not been out of bed otherwise, and cannot take many steps with assistance.       Medications  Scheduled Meds:    aspirin chewable tablet 81 mg 81 mg Oral QDAY   dextran 70/hypromellose (NATURAL BALANCE TEARS) 0.1/0.3 % ophthalmic solution 1 drop 1 drop Both Eyes QID   fluconazole (DIFLUCAN) tablet 200 mg 200 mg Oral QDAY   folic acid (FOLVITE) tablet 1 mg 1 mg Oral QDAY   furosemide (LASIX) tablet 60 mg 60 mg Oral QDAY   heparin (porcine) PF syringe 5,000 Units 5,000 Units Subcutaneous Q8H insulin aspart U-100 (NOVOLOG FLEXPEN) injection PEN 0-7 Units 0-7 Units Subcutaneous ACHS   insulin NPH (HUMULIN N KwikPen) injection PEN 14 Units 14 Units Subcutaneous QDAY(07)   levothyroxine (SYNTHROID) tablet 175 mcg 175 mcg Oral QDAY(07)   magnesium oxide (MAG-OX) tablet 400 mg 400 mg Oral BID   metoprolol tartrate (LOPRESSOR) tablet 50 mg 50 mg Oral BID   pantoprazole DR (PROTONIX) tablet 40 mg 40 mg Oral BID(11-21)   potassium chloride SR (K-DUR) tablet 20 mEq 20 mEq Oral QDAY(12)   senna/docusate (SENOKOT-S) tablet 2 tablet 2 tablet Oral BID   tiZANidine (ZANAFLEX) tablet 4 mg 4 mg Oral QHS   vitamins, multiple tablet 1 tablet 1 tablet Oral QDAY   Continuous Infusions:  PRN and Respiratory Meds:acetaminophen Q6H PRN, dextromethorphan/guaiFENesin Q4H PRN, polyethylene glycol  3350 BID PRN, prochlorperazine Q6H PRN, sodium chloride PRN, traMADol Q6H PRN      Review of Systems:  Constitutional: negative  Eyes: negative  Ears, nose, mouth, throat, and face: negative  Respiratory: Positive for cough  Cardiovascular: Negative  Gastrointestinal: Positive for Abd pain, nausea, retching.  Genitourinary:negative  Integument/breast: negative  Neurologic: Negative  Objective:                          Vital Signs: Last Filed                 Vital Signs: 24 Hour Range   BP: 146/58 (12/27 0346)  Temp: 36.6 ???C (97.8 ???F) (12/27 0346)  Pulse: 80 (12/27 0346)  Respirations: 18 PER MINUTE (12/27 0346)  SpO2: 96 % (12/27 0346)  O2 Delivery: Nasal Cannula (12/27 0346) BP: (107-146)/(50-63)   Temp:  [36.4 ???C (97.6 ???F)-36.7 ???C (98.1 ???F)]   Pulse:  [80-111]   Respirations:  [16 PER MINUTE-20 PER MINUTE]   SpO2:  [95 %-100 %]   O2 Delivery: Nasal Cannula   Intensity Pain Scale (Self Report): 5 (05/28/17 2158)  Verbal Pain Description: Moderate Pain (05/28/17 0900) Vitals:    05/27/17 0544 05/28/17 0600 05/29/17 0346   Weight: 135.2 kg (298 lb 1.6 oz) 133.8 kg (295 lb) 135.4 kg (298 lb 6.4 oz) Intake/Output Summary:  (Last 24 hours)    Intake/Output Summary (Last 24 hours) at 05/29/2017 1610  Last data filed at 05/29/2017 0400  Gross per 24 hour   Intake 580 ml   Output 1000 ml   Net -420 ml      Stool Occurrence: 0    Physical Exam  General Appearance: Appears overweight  well nourished, stated age and in no distress  PSYCH: Normal behavior, goal directed thinking, normal speech (rate and volume)  HEENT: Normocephalic and atraumatic, mucous membranes moist. Sclera anicteric bilaterally.   CV: S1 S2 RRR. No murmurs, rubs, clicks, gallops or S4. Distal extremities warm without cyanosis or clubbing. Radial pulses 2+ bilaterally and symmetric  PULM:No wheezes,  rhonchi, increased effort of breathing or diminished lung sounds in anterior lung bases.   ABD: Soft, obese, NTTP.   EXT: 2+ pitting edema to BLE and 1+ to BUE (L>R). No obvious deformity to BLE.  SKIN: Warm and dry to touch. Pallor noted. Patient becomes more red when laying flat.  NEURO: Tone grossly normal, cranial nerves 2-12 grossly intact, tracks to voice. Alert and interactive.     Lab Review  24-hour labs:    Results for orders placed or performed during the hospital encounter of 05/20/17 (from the past 24 hour(s))   MAGNESIUM    Collection Time: 05/28/17  8:00 AM   Result Value Ref Range    Magnesium 1.8 1.6 - 2.6 mg/dL   BASIC METABOLIC PANEL    Collection Time: 05/28/17  8:00 AM   Result Value Ref Range    Sodium 136 (L) 137 - 147 MMOL/L    Potassium 4.3 3.5 - 5.1 MMOL/L    Chloride 98 98 - 110 MMOL/L    CO2 34 (H) 21 - 30 MMOL/L    Anion Gap 4 3 - 12    Glucose 126 (H) 70 - 100 MG/DL    Blood Urea Nitrogen 30 (H) 7 - 25 MG/DL    Creatinine 9.60 (H) 0.4 - 1.00 MG/DL    Calcium 9.4 8.5 - 45.4 MG/DL    eGFR Non African American  43 (L) >60 mL/min    eGFR African American 52 (L) >60 mL/min   CBC    Collection Time: 05/28/17  8:04 AM   Result Value Ref Range    White Blood Cells 4.6 4.5 - 11.0 K/UL    RBC 2.78 (L) 4.0 - 5.0 M/UL Hemoglobin 8.7 (L) 12.0 - 15.0 GM/DL    Hematocrit 16.1 (L) 36 - 45 %    MCV 97.5 80 - 100 FL    MCH 31.3 26 - 34 PG    MCHC 32.1 32.0 - 36.0 G/DL    RDW 09.6 (H) 11 - 15 %    Platelet Count 124 (L) 150 - 400 K/UL    MPV 8.5 7 - 11 FL   POC GLUCOSE    Collection Time: 05/28/17  8:56 AM   Result Value Ref Range    Glucose, POC 127 (H) 70 - 100 MG/DL   POC GLUCOSE    Collection Time: 05/28/17  1:25 PM   Result Value Ref Range    Glucose, POC 113 (H) 70 - 100 MG/DL   POC GLUCOSE    Collection Time: 05/28/17  5:30 PM   Result Value Ref Range    Glucose, POC 94 70 - 100 MG/DL   POC GLUCOSE    Collection Time: 05/28/17  9:57 PM   Result Value Ref Range    Glucose, POC 136 (H) 70 - 100 MG/DL       Point of Care Testing  (Last 24 hours)  Glucose: (!) 126 (05/28/17 0800)  POC Glucose (Download): (!) 136 (05/28/17 2157)    Radiology and other Diagnostics Review:    Pertinent radiology reviewed.    Rosezena Sensor, MD   Pager [phone]

## 2017-05-30 LAB — POC GLUCOSE
Lab: 102 mg/dL — ABNORMAL HIGH (ref 70–100)
Lab: 137 mg/dL — ABNORMAL HIGH (ref 70–100)
Lab: 142 mg/dL — ABNORMAL HIGH (ref 70–100)
Lab: 151 mg/dL — ABNORMAL HIGH (ref 70–100)

## 2017-05-30 LAB — BASIC METABOLIC PANEL: Lab: 137 MMOL/L — ABNORMAL LOW (ref >60)

## 2017-05-30 LAB — CBC: Lab: 4 K/UL — ABNORMAL LOW (ref 4.5–11.0)

## 2017-05-30 LAB — MAGNESIUM: Lab: 2 mg/dL — ABNORMAL LOW (ref 1.6–2.6)

## 2017-05-30 LAB — TROPONIN-I: Lab: 0 ng/mL (ref 0.0–0.05)

## 2017-05-30 NOTE — Progress Notes
PHYSICAL THERAPY      SUBJECTIVE:  Reason for admission: respiratory failure - medically complex patient PMH: CAD, GI bleed, CABG, heart failure, afib, anemia, HTN, DM, CKD, hypothyroidism, morbid obesity, liver disease, HTN, congestive colopathy, ascites  Mental / Cognitive Status: Alert;Cooperative;Follows Commands  Pain: Patient has no complaint of pain  Ambulation Assist: Independent Mobility at [employer] Level with Device  Patient Owned Equipment: Nurse, adult  Home Situation: Lives with Family  Type of Home: House  Entry Stairs: Ramp  In-Home Stairs: No Stairs  ???  BED MOBILITY/TRANSFERS:  Supine to Sit: Standby Assist;Verbal Cues;Head of Bed Elevated;Use of Rail;Requires Extra Time  Sit to Supine: Standby Assist;Verbal Cues;Bed Flat;Use of Rail;Requires Extra Time  Transfer Type: Sit to/from Stand  Transfer: Assistance Level: Bed;To/From;Bed Side Chair;Standby Assist  Transfer: Assistive Device: Nurse, adult  Transfers: Type Of Assistance: Materials engineer;Requires Extra Time    GAIT:   10 feet with a roller walker and SBA.  Wide stance and small steps.  Pace is very slow    EXERCISE:  Sit to stand and bed to/from chair was practiced multiple times.  A walker was used and cues were provided for technique.  5 times sit to stand was peformed; 65 seconds    Patient on 2 liters of O2.  O2 sats were stable before, during and after therapy.  Lots of encouragement needed.  Rest breaks between each movement was needed.???    PROGRESS:  Weakness, impaired balance, anxiety about falling and self limiting behavior have impaired her mobility.  ???  AM-PAC 6 Clicks Basic Mobility Inpatient  Turning from your back to your side while in a flat bed without using bed rails: A Little  Moving from lying on your back to sitting on the side of a flatbed without using bedrails : A Little  Moving to and from a bed to a chair (including a wheelchair): A Little  Standing up from a chair using your arms (e.g. wheelchair, or bedside chair): A Little  To walk in hospital room: A Lot  Climbing 3-5 steps with a railing: Total  Raw Score: 15  Standardized (T-scale) Score: 36.97  Basic Mobility CMS 0-100%: 50.4  CMS G Code Modifier for Basic Mobility: CK  ???  GOALS:  Formulation: With Patient  Time For Goal Achievement: 5 days  Pt Will Go Supine To/From Sit: w/ Minimal Assist, Met, New Goal, w/ Stand By Assist  Pt Will Transfer Bed/Chair: w/ Stand By Assist, Ongoing  Pt Will Transfer Sit to Stand: w/ Stand By Assist, Ongoing  Pt Will Ambulate: 1-10 Feet, w/ Dan Humphreys, w/ Minimal Assist, Ongoing  ???  PLAN:  Treatment Interventions: Mobility Training;Strengthening;Balance Activities  Plan Frequency: 5 Days per Week  ???  Practice transfers.  Practice gait with a wheelchair follow.  ???  RECOMMENDATIONS:  PT Discharge Recommendations: Inpatient Setting  Recommend ongoing assistance for: In and out of house;Transfers;Bed mobility;Ambulation;Stairs    Therapist: Arville Go, PT  Date: 05/30/2017

## 2017-05-30 NOTE — Progress Notes
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)    Morbid obesity (HCC)      Interval:  -Continue Lasix to 40mg  IV BID    #Acute Hypercarbic???& Hypoxemic???Respiratory???Failure (Improved)  #L Pleural Effusion  - d/t HCAP   -???CT chest (OSH):???circumferential???L???pleural effusion???&???densely consolidated???L???base???w/???air bronchograms  - extubated 12/19  - currently on 2 L/NC, satting well, was discharged home on 10/25 on room air from HF service, but states she had 2L O2 prior to admission  PLAN  - Increased lasix to IV 40 mg BID    #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  #CAD, HTN  -???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  - echo???10/18: LVEF 55-60%, dilated RV with hypocontractility, 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,???moderate TR, moderate LA???enlargement, +???intra-atrial PFO with bidirectional shunt, s/p left atrial appendage ligation  - repeat echo 05/21/17: LVEF 50-55%, no regional wall motion abnormalities; RV mildly dilated but function probably normal; mild to moderate MR; mild to moderate TR; peak PAP 41 mmHg; no pericardial effusion  - EKG:???no ST changes  - troponin negative x 2  - BNP 247 (prior 1,178)  - PTA lasix 40 mg daily, but then given IVF when hypotensive overnight  PLAN  -???continue PTA ASA    #Septic Shock???(resolved)  - arrived to [place] on pressor, but essentially stopped immediately  -???central venous O2 sat 84%  - hypotensive overnight and briefly on dopamine (d/t bradycardia) -- off since ~ 0200  - SBP 100-140s #Bradycardia; 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 pt aware of risk of aspiration  - SLP recs 12/22: Continue regular solids, thin liquids  #Constipation  -Increased Bowel regimen 12/26:senna/docusate and added MiraLax and 1x bicosadyl enema.  -12/28: poor BM output. Refused 1x Miralax, will attempt again     #AKI on CKD (resolved)  - baseline Cr  1.3  - Cr 2.4 on arrival at OSH --> 1.3 this AM 12/23  - Will avoid nephrotoxins, renally dose meds, strict I/Os  ???  #DM2  -???PTA regimen:???NPH 14 units Q AM, novolog 4 units TID  - BS 110-160s last 24 hrs  PLAN  - continue 14U NPH daily + low dose correction factor  #Hypothyroidism  -???TSH???1.99 (12/18)  PLAN  -???continue???PTA???synthroid daily  ???  #Septic Shock (resolved)  #HCAP(resolved)  #UTI(resolved)  - WBC???3.9;???afebrile  - hx???resistant e.coli UTI  - per OSH micro tech, no blood cxs were drawn  - urine cx (OSH): + e.coli (resitant to cefazolin, ampicillin, levaquin, augmentin,???cipro; sensitive to  merrem, ceftriaxone & bactrim)  - BAL 12/17???(OSH): usual upper respiratory flora, candida species  -???CT chest (OSH): dense L base consolidation  - blood cx???12/18: NGTD  - sputum cx 12/18: GS w/o organisms; cx NGTD  - UA???12/18: 1+ leuks, + nitrites, 20-50 WBC, few bacteria  - urine cx???12/19: < 100,000 candida albicans  - strep pneumo and legionella urine Ags negative  - procalcitonin???0.22 (12/18)  - no suitable pocket of ascites (on bedside US) for a paracentesis   -ceftriaxone and doxycycline x 7 days (through 12/23)  ???  FEN  - Regular solids, thin liquids, swallow precautions  - tube feedings: dc'd  - IVF: none  - review electrolytes and replace as needed  ???  DVT PPx: SCDs; Heparin  Code Status: Full Code  Disposition/Family: Continue admission to med 2  Patient seen and discussed with Dr. Orson Ape  ???  Bridget Hartshorn  PGY-1  Pager [phone]     ________________________________________________________________________    Diane Savage is a 61 y.o. female.  Overnight, patient had repeat episode of chest pain. Reported NTTP, EKG and troponin done. EKG was unchanged from patient's prior, and Troponins were negative. When visiting today, patient appeared tired, said she didn't sleep until around 4am. Patient was too tired to discuss chest pain today, but said it had improved by the time of my exam, and endorsed TTP. Otherwise, no new complaints. Denies abdominal pain, SOB. Only small amounts of stools yesterday, only 1x recorded.     Medications  Scheduled Meds:    aspirin chewable tablet 81 mg 81 mg Oral QDAY   dextran 70/hypromellose (NATURAL BALANCE TEARS) 0.1/0.3 % ophthalmic solution 1 drop 1 drop Both Eyes QID   fluconazole (DIFLUCAN) tablet 200 mg 200 mg Oral QDAY   folic acid (FOLVITE) tablet 1 mg 1 mg Oral QDAY   furosemide (LASIX) injection 40 mg 40 mg Intravenous BID(9-17)   heparin (porcine) PF syringe 5,000 Units 5,000 Units Subcutaneous Q8H insulin aspart U-100 (NOVOLOG FLEXPEN) injection PEN 0-7 Units 0-7 Units Subcutaneous ACHS   insulin NPH (HUMULIN N KwikPen) injection PEN 14 Units 14 Units Subcutaneous QDAY(07)   levothyroxine (SYNTHROID) tablet 175 mcg 175 mcg Oral QDAY(07)   magnesium oxide (MAG-OX) tablet 400 mg 400 mg Oral BID   metoprolol tartrate (LOPRESSOR) tablet 50 mg 50 mg Oral BID   pantoprazole DR (PROTONIX) tablet 40 mg 40 mg Oral BID(11-21)   potassium chloride SR (K-DUR) tablet 20 mEq 20 mEq Oral QDAY(12)   senna/docusate (SENOKOT-S) tablet 2 tablet 2 tablet Oral BID   tiZANidine (ZANAFLEX) tablet 4 mg 4 mg Oral QHS   vitamins, multiple tablet 1 tablet 1 tablet Oral QDAY   Continuous Infusions:  PRN and Respiratory Meds:acetaminophen Q6H PRN, dextromethorphan/guaiFENesin Q4H PRN, polyethylene glycol 3350 BID PRN, prochlorperazine Q6H PRN, sodium chloride PRN, traMADol  Q6H PRN      Review of Systems:  Constitutional: +Tiredness  Eyes: negative  Ears, nose, mouth, throat, and face: negative  Respiratory: Negative  Cardiovascular: +Chest pain  Gastrointestinal:Negative  Genitourinary:negative  Integument/breast: negative  Neurologic: Negative  Objective:                          Vital Signs: Last Filed                 Vital Signs: 24 Hour Range   BP: 126/62 (12/28 1200)  Temp: 36.7 ???C (98.1 ???F) (12/28 1200)  Pulse: 82 (12/28 1200)  Respirations: 20 PER MINUTE (12/28 1200)  SpO2: 98 % (12/28 1200)  O2 Delivery: Nasal Cannula (12/28 1200) BP: (104-153)/(51-66)   Temp:  [36.7 ???C (98.1 ???F)-37 ???C (98.6 ???F)]   Pulse:  [79-104]   Respirations:  [18 PER MINUTE-20 PER MINUTE]   SpO2:  [95 %-98 %]   O2 Delivery: Nasal Cannula   Intensity Pain Scale (Self Report): 5 (05/29/17 2146) Vitals:    05/27/17 0544 05/28/17 0600 05/29/17 0346   Weight: 135.2 kg (298 lb 1.6 oz) 133.8 kg (295 lb) 135.4 kg (298 lb 6.4 oz)       Intake/Output Summary:  (Last 24 hours)    Intake/Output Summary (Last 24 hours) at 05/30/2017 1307 Last data filed at 05/30/2017 1015  Gross per 24 hour   Intake 730 ml   Output 1400 ml   Net -670 ml      Stool Occurrence: 1    Physical Exam  General Appearance: Appears overweight  well nourished, stated age and in no distress. Somnolent but easily rousable  PSYCH: Normal behavior, goal directed thinking, normal speech (rate and volume)  HEENT: Normocephalic and atraumatic, mucous membranes moist. Sclera anicteric bilaterally.   CV: S1 S2 RRR. No murmurs, rubs, clicks, gallops or S4. Distal extremities warm without cyanosis or clubbing. Radial pulses 2+ bilaterally and symmetric  PULM:No wheezes,  rhonchi, increased effort of breathing or diminished lung sounds in anterior lung bases.   ABD: Soft, obese, NTTP.   EXT: 2+ pitting edema to BLE and 1+ to BUE (L>R). No obvious deformity to BLE.  SKIN: Warm and dry to touch. Pallor noted.  NEURO: Tone grossly normal, cranial nerves 2-12 grossly intact, tracks to voice. Alert and interactive.     Lab Review  24-hour labs:    Results for orders placed or performed during the hospital encounter of 05/20/17 (from the past 24 hour(s))   POC GLUCOSE    Collection Time: 05/29/17  5:19 PM   Result Value Ref Range    Glucose, POC 106 (H) 70 - 100 MG/DL   POC GLUCOSE    Collection Time: 05/29/17  8:40 PM   Result Value Ref Range    Glucose, POC 142 (H) 70 - 100 MG/DL   TROPONIN-I    Collection Time: 05/29/17 10:20 PM   Result Value Ref Range    Troponin-I 0.01 0.0 - 0.05 NG/ML   CBC    Collection Time: 05/30/17  5:17 AM   Result Value Ref Range    White Blood Cells 4.0 (L) 4.5 - 11.0 K/UL    RBC 2.55 (L) 4.0 - 5.0 M/UL    Hemoglobin 8.1 (L) 12.0 - 15.0 GM/DL    Hematocrit 47.8 (L) 36 - 45 %    MCV 96.7 80 - 100 FL    MCH 31.6 26 - 34 PG  MCHC 32.7 32.0 - 36.0 G/DL    RDW 16.1 (H) 11 - 15 %    Platelet Count 114 (L) 150 - 400 K/UL    MPV 8.4 7 - 11 FL   BASIC METABOLIC PANEL    Collection Time: 05/30/17  5:17 AM   Result Value Ref Range    Sodium 137 137 - 147 MMOL/L Potassium 4.7 3.5 - 5.1 MMOL/L    Chloride 97 (L) 98 - 110 MMOL/L    CO2 36 (H) 21 - 30 MMOL/L    Anion Gap 4 3 - 12    Glucose 118 (H) 70 - 100 MG/DL    Blood Urea Nitrogen 31 (H) 7 - 25 MG/DL    Creatinine 0.96 (H) 0.4 - 1.00 MG/DL    Calcium 9.4 8.5 - 04.5 MG/DL    eGFR Non African American 40 (L) >60 mL/min    eGFR African American 49 (L) >60 mL/min   MAGNESIUM    Collection Time: 05/30/17  5:17 AM   Result Value Ref Range    Magnesium 2.0 1.6 - 2.6 mg/dL   POC GLUCOSE    Collection Time: 05/30/17  9:15 AM   Result Value Ref Range    Glucose, POC 102 (H) 70 - 100 MG/DL   POC GLUCOSE    Collection Time: 05/30/17 11:48 AM   Result Value Ref Range    Glucose, POC 137 (H) 70 - 100 MG/DL       Point of Care Testing  (Last 24 hours)  Glucose: (!) 118 (05/30/17 0517)  POC Glucose (Download): (!) 137 (05/30/17 1148)    Radiology and other Diagnostics Review:    Pertinent radiology reviewed.    Rosezena Sensor, MD   Pager [phone]

## 2017-05-30 NOTE — Progress Notes
Patient c/o sharp chest pain radiating to her back. VSS. Dr. Arnette Felts notified and came to bedside to assess. Troponin and EKG ordered.

## 2017-05-30 NOTE — Consults
Physical Medicine & Rehabilitation Consult Note       Date of Service:  05/30/2017  Diane Savage is a 61 y.o. female.     DOB: [birth year]                  MRN#:  [id number]  Primary Insurance: [place] MEDICAID PEND[place]  Secondary Insurance:   Tertiary Insurance:   Financial Class:  MEDICAID PENDING  Date of Admission:  05/20/2017  Referring Physician:  Delon Sacramento, MD  Reason for Consult: evaluate for Post-Acute Rehab/Placement  Precautions: Fall      Active Problems  Acute on chronic hypoxic/hypercapnic respiratory failure  HCAP  UTI  Hx Liver disease  Anemia of chronic dz  Septic shock  Acute on chronic combined heart failure  Gait abnormality  Impaired self cares     Assessment & Plan       Diane Savage is a 61 y.o. female admitted to [facility] on 05/20/2017 with the following issues:    Debility 2/2 HCAP, septic shock, acute respiratory failure, acute on chronic heart failure     Impairments: weakness  Activity Limitations:  dressing - upper, dressing - lower, transfers and ambulation  Participation Restrictions: unable to return home safely    Post-acute care rehabilitation needs: acute inpatient rehabilitation      Given the patient's high medical complexity and co morbidities including, but not limited to debility in the setting of prolonged hospitalization and complex medical illness including acute hypoxic/hypercapnic respiratory failure requiring mechanical ventilation in setting of HCAP, severe sepsis w/ shock, requiring transient pressor support, underlying Hx of liver disease, chronic anemia, anasarca requiring aggressive diuresis, and in conjunction with significant functional goals with PT and OT, the patient will require daily physician oversight and intensive rehab therapies at an acute inpatient rehabilitation after acute inpatient hospitalization. Our service will continue to monitor functional progress with current barriers to acute inpatient rehab listed below.    The patient would prefer to discharge to a facility closer to her home if possible. She reports she is very motivated to participate in intensive rehab therapies.     Goals & Barriers    Family / Patient Goals: return home with family assistance  Mobility Goals: Overall goal is Stand by assistance  Activities of Daily Living (ADLs) Goals: Overall goal is Stand by assistance  Cognition / Communication Goals: Cognition grossly intact    Barriers: High burden of care  Facilitators: good home setup, good family / social support and patient motivation    Rehabilitation Prognosis: Good    Tolerance for three hours of therapy a day: Good      PT, OT consulted to address deficits as below     Impaired gait/mobility:  PTA pt was independent at home level with assistive device  Currently requiring min assist for transfers, SBA for very short gait w/ RW and balance deficits   Will benefit from continued work with PT to address mobility deficits     Impaired ADL:  PTA pt was independent  Currently requiring min assist for transfers   Will benefit from ongoing OT to address functional deficits        Thank you for this consultation.  Please call our consult pager with questions or concerns.     [name], MD     Rehab Consult Pager:  [phone]    History of Present Illness     Hospital Course: Diane Savage is a 61  y/o F w/ Hx of liver dz (diffuse heptaic steatosis), chronic anemia, DM II, CAD, HTN, who presented to OSH with acute respiratory failure (hypoxic/hypercapnic) in the setting of septic shock with underlying HCAP, UTI, transferred to [facility] for definitive tx. She was intubated on transfer and requiring pressor support which was subsequently discontinued. 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 She had completed IV abx therapies.  Pt is working with PT and OT to address functional and mobility deficits, rehab is now consulted for post-acute rehab/placement recommendations.     Past Medical History:   Diagnosis Date   ??? Acquired hypothyroidism    ??? Arthritis    ??? Back pain    ??? Bleeding disorder (HCC)    ??? CAD (coronary artery disease), native coronary artery 02/04/2017   ??? Chronic diastolic heart failure (HCC) 03/19/2017   ??? Coronary artery disease    ??? Diabetes mellitus (HCC)     Type II   ??? Essential hypertension 02/11/2017   ??? Hypertension    ??? Stomach disorder    ??? Vision decreased         Past Surgical History:   Procedure Laterality Date   ??? HX HEART CATHETERIZATION  2017   ??? CORONARY STENT PLACEMENT  2017   ??? ANKLE SURGERY      Left ankle reconstruction   ??? COLONOSCOPY     ??? HX KNEE ARTHROSCOPY Left    ??? HX TONSIL AND ADENOIDECTOMY     ??? TUBAL LIGATION     ??? UPPER GASTROINTESTINAL ENDOSCOPY          Social History     Socioeconomic History   ??? Marital status: Married     Spouse name: Not on file   ??? Number of children: Not on file   ??? Years of education: Not on file   ??? Highest education level: Not on file   Social Needs   ??? Financial resource strain: Not on file   ??? Food insecurity - worry: Not on file   ??? Food insecurity - inability: Not on file   ??? Transportation needs - medical: Not on file   ??? Transportation needs - non-medical: Not on file   Occupational History   ??? Not on file   Tobacco Use   ??? Smoking status: Former Smoker     Packs/day: 2.00     Years: 10.00     Pack years: 20.00     Types: Cigarettes     Last attempt to quit: 09/16/1980     Years since quitting: 36.7   ??? Smokeless tobacco: Never Used   Substance and Sexual Activity   ??? Alcohol use: Yes     Comment: Seldom   ??? Drug use: No   ??? Sexual activity: Not on file   Other Topics Concern   ??? Not on file   Social History Narrative   ??? Not on file     Family History   Problem Relation Age of Onset ??? Diabetes Mother    ??? Heart Disease Mother    ??? High Cholesterol Mother    ??? Arthritis-rheumatoid Mother    ??? Stroke Mother    ??? Thyroid Disease Mother    ??? Coronary Artery Disease Mother    ??? Hyperlipidemia Mother    ??? Cancer Father    ??? Cancer-Breast Sister    ???  Cancer Sister    ??? Hypertension Sister    ??? Heart Disease Sister    ??? High Cholesterol Sister    ??? Arthritis-osteo Sister    ??? Migraines Sister    ??? Rashes/Skin Problems Sister    ??? Depression Sister    ??? Hyperlipidemia Sister    ??? Cancer Brother    ??? Hypertension Brother    ??? Arthritis-rheumatoid Brother    ??? Rashes/Skin Problems Brother    ??? Coronary Artery Disease Brother    ??? Heart Disease Maternal Aunt    ??? Coronary Artery Disease Paternal Uncle    ??? Hyperlipidemia Paternal Uncle    ??? Cancer Maternal Grandmother    ??? Diabetes Maternal Grandmother    ??? Heart Disease Maternal Grandmother    ??? Cancer-Colon Paternal Grandmother    ??? Cancer Paternal Grandmother    ??? Diabetes Paternal Grandmother    ??? Cancer Paternal Grandfather         Scheduled Meds:  aspirin chewable tablet 81 mg 81 mg Oral QDAY   dextran 70/hypromellose (NATURAL BALANCE TEARS) 0.1/0.3 % ophthalmic solution 1 drop 1 drop Both Eyes QID   fluconazole (DIFLUCAN) tablet 200 mg 200 mg Oral QDAY   folic acid (FOLVITE) tablet 1 mg 1 mg Oral QDAY   furosemide (LASIX) injection 40 mg 40 mg Intravenous BID(9-17)   heparin (porcine) PF syringe 5,000 Units 5,000 Units Subcutaneous Q8H   insulin aspart U-100 (NOVOLOG FLEXPEN) injection PEN 0-7 Units 0-7 Units Subcutaneous ACHS   insulin NPH (HUMULIN N KwikPen) injection PEN 14 Units 14 Units Subcutaneous QDAY(07)   levothyroxine (SYNTHROID) tablet 175 mcg 175 mcg Oral QDAY(07)   magnesium oxide (MAG-OX) tablet 400 mg 400 mg Oral BID   metoprolol tartrate (LOPRESSOR) tablet 50 mg 50 mg Oral BID   pantoprazole DR (PROTONIX) tablet 40 mg 40 mg Oral BID(11-21)   potassium chloride SR (K-DUR) tablet 20 mEq 20 mEq Oral QDAY(12) senna/docusate (SENOKOT-S) tablet 2 tablet 2 tablet Oral BID   tiZANidine (ZANAFLEX) tablet 4 mg 4 mg Oral QHS   vitamins, multiple tablet 1 tablet 1 tablet Oral QDAY   Continuous Infusions:  PRN and Respiratory Meds:acetaminophen Q6H PRN, dextromethorphan/guaiFENesin Q4H PRN, polyethylene glycol 3350 BID PRN, prochlorperazine Q6H PRN, sodium chloride PRN, traMADol Q6H PRN       Allergies   Allergen Reactions   ??? Sulfa (Sulfonamide Antibiotics) RASH   ??? Duricef [Cefadroxil] NAUSEA AND VOMITING   ??? Pravastatin NAUSEA AND VOMITING   ??? Simvastatin NAUSEA AND VOMITING       Prior Level of Function     Self-Care/ADLs:  Independent   Mobility: independent at home level w/ assistive device     Home Environment:  Home Situation: Lives with Family (05/30/2017 10:00 AM)    Patient Owned Equipment: Bernardo Heater (05/30/2017 10:00 AM)    Type of Home: House (05/30/2017 10:00 AM)    Entry Stairs: Ramp (05/30/2017 10:00 AM)    In-Home Stairs: No Stairs (05/30/2017 10:00 AM)    Comments: Reports being able to get around house but with SOA. Was lately just put on 2L 02 NC at home a week ago.  Fell off toilet in bathroom.  Transfer from [facility]. (05/26/2017 10:18 AM)    No Data Recorded  @ADDRESSFULL @    Current Level Of Function:   PT Gait:Gait Distance: 2 feet(x2, during transfers - anticipate pt could walk further) Gait: Assistance Level: Standby Assist Gait: Assistive Device: Nurse, adult  Bed Mobility/Transfers  Bed Mobility: Supine to Sit: Standby Assist, Verbal Cues, Head of Bed Elevated, Use of Rail, Requires Extra Time  Bed Mobility: Sit to Supine: Standby Assist, Verbal Cues, Bed Flat, Use of Rail, Requires Extra Time  Comments: Sitting up in bedside chair with TABS alarm activated and call light within reach.   Transfer Type: Sit to/from Stand  Transfer: Assistance Level: Bed, To/From, Bed Side Chair, Standby Assist  Transfer: Assistive Device: Nurse, adult Transfers: Type Of Assistance: Materials engineer, Requires Extra Time  Other Transfer Type: Sit to/from Stand  Other Transfer: Assistance Level: Bed, To/From, Bed Side Chair, Minimal Assist  Other Transfer: Assistive Device: Nurse, adult  Other Transfer: Type Of Assistance: For Balance, For Strength Deficit, For Safety Considerations  End Of Activity Status: In Bed, Nursing Notified, Instructed Patient to Use Call Light  Comments: Pt required total assist for hygiene after voiding.  Pt declined attempting on her own due to stating she is too swollen.   OT ADL's  Where Assessed: Edge of Bed, Chair  LE Dressing Assist: Total Assist(BLE too edematous to reach or bend)  LE Dressing Deficits: Don/Doff R Sock, Don/Doff L Sock  Toileting Assist: Moderate Assist  Toileting Deficits: Perineal Hygiene, Bedside Commode(no LB clothing to manage)  Functional Transfer Assist: Moderate Assist(Supine to sit with HOB elevated)  Functional Transfer Deficits: Supervision/Safety, Steadying  Comment: Patient ambulates to the commode placed across the room using roller walker, contact guard assist. Cues to reach hands back for commode arm rest prior to sitting.   SLP COGNITIVE EVALUATION SUMMARY     PRAGMATICS:    BEHAVIOR:    AUDITORY COMPREHENSION:      ORIENTATION:    AUDITORY ATTENTION/WORKING MEMORY:    AUDITORY MEMORY/SUSTAINED ATTENTION:    NEW LEARNING:    SEQUENCING/ORGANIZATION:    PROBLEM SOLVING:    REASONING:      MATH/MONEY SKILLS:      VISUAL PERCEPTUAL:    SWALLOW EVALUATION SUMMARY  Summary: Clinical swallow evaluation completed this date. Suspect moderate-severe oropharyngeal dysphagia present at this time.   Oral Stage Summary*: PO trials of ice chips, thin and nectar thick liquids via tsp/cup, and pureed solids provided this date. Pt able to withdraw boluses with assistance feeding. Suspect reduced formation and minimally delayed AP transfer of boluses due to weakness and/or mental status. No chewable solids provided. No anterior bolus spillage nor oral residue noted.   Pharyngeal Stage Summary*: Swallow initiation appeared minimally delayed. Laryngeal elevation reduced as judged via laryngeal palpation. Cough noted throughout evaluation, prior to and following PO trials. Consistent, delayed and immediate cough response with thin and nectar thick liquids provided this date. Delayed cough/throat clear also noted with ice chips and pureed solids this date. Multiple swallows (2-4 per bolus) noted intermittently across consistencies indicating pharyngeal residue and/or laryngeal penetration.   Swallow Recommendations*  NPO: Temporary Non-Oral Nutrition  Plan: Continue Treatment __x/week (Comment).  Prognosis: Fair, Good     Review of Systems     A 14 point review of systems was negative except for: that noted in the HPI    Physical Exam     BP: 126/62 (12/28 1200)  Temp: 36.7 ???C (98.1 ???F) (12/28 1200)  Pulse: 82 (12/28 1200)  Respirations: 20 PER MINUTE (12/28 1200)  SpO2: 98 % (12/28 1200)  O2 Delivery: Nasal Cannula (12/28 1200)  Body mass index is 51.22 kg/m???.     Gen: awake, alert, NAD, obese  HEENT: NCAT, EOMI, MMM  Neck: Supple and symmetric  Heart: Extremities are well perfused  Lungs: respirations even and non-labored  Abdomen: Soft, non-distended  Psych: pleasant mood/appropriate affect  Ext: No c/c/e  MS:     Root Right Left   Elbow Flexion C5 4 4   Wrist Extension C6 4 4   Elbow Extension C7 4- 4-   Finger Flexion C8 4 4   Finger Abduction T1 4 4   Hip Flexion L2 2 2   Knee Extension L3 3 3   Dorsiflexion L4 4 4   EHL Extension  L5 4 4   Plantarflexion S1 4 4     Neuro:  Cranial Nerves Cranial Nerves 2-12 are grossly intact   DTR's no hyperreflexia                       Upper Extremity Tone Normal   Lower Extremity Tone Normal   Upper Extremity Sensation Intact to light touch bilaterally   Lower Extremity Sensation Intact to light touch bilaterally       Memory/Concentration  grossly intact Intake/Output Summary (Last 24 hours) at 05/30/2017 1353  Last data filed at 05/30/2017 1015  Gross per 24 hour   Intake 490 ml   Output 1200 ml   Net -710 ml        Hematology:  Lab Results   Component Value Date    HGB 8.1 05/30/2017    HCT 24.7 05/30/2017    PLTCT 114 05/30/2017    WBC 4.0 05/30/2017    NEUT 62 05/26/2017    ANC 2.20 05/26/2017    ALC 0.70 05/26/2017    MONA 11 05/26/2017    AMC 0.40 05/26/2017    ABC 0.00 05/26/2017    MCV 96.7 05/30/2017    MCHC 32.7 05/30/2017    MPV 8.4 05/30/2017    RDW 16.7 05/30/2017   , Coagulation:    Lab Results   Component Value Date    PTT 30.2 03/27/2017    INR 1.2 05/25/2017    and General Chemistry:  Lab Results   Component Value Date    NA 137 05/30/2017    K 4.7 05/30/2017    CL 97 05/30/2017    GAP 4 05/30/2017    BUN 31 05/30/2017    CR 1.34 05/30/2017    GLU 118 05/30/2017    CA 9.4 05/30/2017    ALBUMIN 2.6 05/26/2017    LACTIC 0.8 02/05/2017    OBSCA 1.17 02/05/2017    MG 2.0 05/30/2017    TOTBILI 0.4 05/26/2017             [name], MD

## 2017-05-30 NOTE — Progress Notes
CLINICAL NUTRITION                                                        Clinical Nutrition Assessment Summary     NAME:Diane Savage             MRN: [id number]             DOB:[birth year]          AGE: 61 y.o.  ADMISSION DATE: 05/20/2017             DAYS ADMITTED: LOS: 10 days    Nutrition Assessment of Patient:  BMI Categories Adult: Obesity Class III: 40 and over(47.23 current wt)  Unintentional Weight Loss: (remains above usual body weight range)  Malnutrition Assessment: Does not meet criteria  Current Oral Intake: Marginally Adequate  Estimated Calorie Needs: 4540-9811(91-47 kcal/kg desired wt of 65.8 kg)  Estimated Protein Needs: 79(1.2 g/kg desired wt of 65.8 kg)  Oral Diet Order: Diabetic 1600-2000 Kcal/day (60 g Carb/meal, 30 g Carb/HS snack), Cardiac, 1500 mL/Day Fluid Restriction    Comments:  61 yo F with hx including 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 She was transferred from [facility] on 12/18 after presenting there on 12/16 for syncope/fatigue. Admit c/b AMS/respiratory failure requiring intubation, afib w/ RVR & septic shock from HCAP + UTI. Has since been extubated (12/19 AM), shock resolved. Diuresis resumed. SLP cleared pt for regular diet and thin liquids. Pt was briefly on EN while intubated, now tolerating a regular diet. She reports she still doesn't have great appetite, but it has slightly improved. Per chart she is eating 50-75% of meals. She reports occasional nausea and constipations. +BM 12/27. PTA she reported decreased intakes/appetite for several weeks.  Pt reports previous poor compliance of diet restrictions. She was observed asking husband for salt. RD previously provided diet education to pt, but expect poor compliance. Pt reports eating several high sodium foods and eating out frequently. 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 Pt has BLE pitting +3 and facial/sacral edema. RD discussed importance and encouraged better compliance. Endorses usual weight range closer to 240-250# (admit wt listed at 291# and most recently 298#). No pressure injuries noted. Will likely benefit from ongoing diet rationale/education for reinforcement once. Pt is no longer at acute nutrition risk due to stable wt and improved intakes.     Recommendation:  Please encourage good diet compliance                                Intervention / Plan:  RD encourage oral intakes. RD discussed DM and low sodium diet  RD will no longer follow unless other wise consulted    Goals:  Patient to consume >50% of meals/supplements  Time Frame: Within 5 Days  Status: Met    Eston Mould, RD, LD  [phone]

## 2017-05-30 NOTE — Case Management (ED)
Case Management Progress Note    NAME:Diane Savage                          MRN: [id number]              DOB:1955-10-20          AGE: 61 y.o.  ADMISSION DATE: 05/20/2017             DAYS ADMITTED: LOS: 10 days      Todays Date: 05/30/2017    Plan  Discharge when medically stable and safe plan established    Interventions  Staffed pt in huddle: pt still extremely weak and not safe to go home.  Pt is [place] medicaid pending and needs rehab before home.  Contacted [facility] to see if they would consider pt pending but they do not take pt's [place] medicaid pending.  Requested team to place [place] rehab consult.  Will continue to f/u and assist as needed.    ? Support      ? Info or Referral      ? Discharge Planning      ? Medication Needs      ? Financial      ? Legal      ? Other        Disposition  ? Expected Discharge Date    Expected Discharge Date: 06/04/17  ? Transportation   Does the patient need discharge transport arranged?: No  Transportation Name, Phone and Availability #1: Jolyn Nap  [phone]  Does the patient use Medicaid Transportation?: No  ? Next Level of Care (Acute Psych discharges only)      ? Discharge Disposition                        Marita Wurtland D. Suzie Portela, [place]      No service has been selected for the patient.      [place] Destination      No service has been selected for the patient.      [place]      No service has been selected for the patient.      [place] [facility]      No service has been selected for the patient.

## 2017-05-30 NOTE — Progress Notes
Heart Failure Nursing Progress Note    Admission Date: 05/20/2017  LOS: 9 days    Admission Weight: 132 kg (291 lb 0.1 oz)        Most recent weights (inpatient):   Vitals:    05/27/17 0544 05/28/17 0600 05/29/17 0346   Weight: 135.2 kg (298 lb 1.6 oz) 133.8 kg (295 lb) 135.4 kg (298 lb 6.4 oz)     Weight change from previous day: +1.6 kg     Fluid restriction ordered: No    Intake/Output Summary: (Last 24 hours)    Intake/Output Summary (Last 24 hours) at 05/29/2017 1844  Last data filed at 05/29/2017 1625  Gross per 24 hour   Intake 942 ml   Output 1150 ml   Net -208 ml       Is patient incontinent No    Anticipated discharge date: TBD   Discharge goals: Decrease fluid      Daily Assessment of Patient Stated Goals:    Short Term Goal Identified by patient (Short Term=during hospitalization):  Move more

## 2017-05-31 LAB — BASIC METABOLIC PANEL: Lab: 137 MMOL/L — ABNORMAL LOW (ref >60)

## 2017-05-31 LAB — POC GLUCOSE
Lab: 149 mg/dL — ABNORMAL HIGH (ref 70–100)
Lab: 148 mg/dL — ABNORMAL HIGH (ref 70–100)
Lab: 129 mg/dL — ABNORMAL HIGH (ref 70–100)
Lab: 159 mg/dL — ABNORMAL HIGH (ref 70–100)

## 2017-05-31 LAB — MAGNESIUM: Lab: 1.9 mg/dL — ABNORMAL LOW (ref 1.6–2.6)

## 2017-05-31 LAB — CBC: Lab: 4.2 K/UL — ABNORMAL LOW (ref >60)

## 2017-05-31 MED ORDER — ACETAMINOPHEN/LIDOCAINE/ANTACID DS(#) 1:1:3  PO SUSP
30 mL | Freq: Once | ORAL | 0 refills | Status: CP
Start: 2017-05-31 — End: ?
  Administered 2017-06-01: 30 mL via ORAL

## 2017-05-31 NOTE — Progress Notes
I have reviewed the notes, assessment, and/or procedures performed by Kaila Nash, student nurse, and concur with her documentation unless otherwise noted.

## 2017-05-31 NOTE — Progress Notes
Heart Failure Nursing Progress Note    Admission Date: 05/20/2017  LOS: 11 days    Admission Weight: 132 kg (291 lb 0.1 oz)        Most recent weights (inpatient):   Vitals:    05/28/17 0600 05/29/17 0346 05/31/17 0500   Weight: 133.8 kg (295 lb) 135.4 kg (298 lb 6.4 oz) 136 kg (299 lb 13.2 oz)     Weight change from previous day: +0.6kg    Fluid restriction ordered: 1.5    Intake/Output Summary: (Last 24 hours)    Intake/Output Summary (Last 24 hours) at 05/31/2017 0315  Last data filed at 05/31/2017 9458  Gross per 24 hour   Intake 670 ml   Output 1250 ml   Net -580 ml       Anticipated discharge date: ongoing   Discharge goals: decrease fluid      Daily Assessment of Patient Stated Goals:    Short Term Goal Identified by patient (Short Term=during hospitalization):  Move more

## 2017-05-31 NOTE — Progress Notes
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)    Morbid obesity (HCC)      Interval:  -Continue Lasix to 40mg  IV BID    #Acute Hypercarbic???& Hypoxemic???Respiratory???Failure (Improved)  #L Pleural Effusion  - d/t HCAP   -???CT chest (OSH):???circumferential???L???pleural effusion???&???densely consolidated???L???base???w/???air bronchograms  - extubated 12/19  - currently on 2 L/NC, satting well, was discharged home on 10/25 on room air from HF service, but states she had 2L O2 prior to admission  PLAN  - Increased lasix to IV 40 mg BID  -Continue 1.5 L Fluid restrict    #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  #CAD, HTN  -???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  - echo???10/18: LVEF 55-60%, dilated RV with hypocontractility, 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,???moderate TR, moderate LA???enlargement, +???intra-atrial PFO with bidirectional shunt, s/p left atrial appendage ligation  - repeat echo 05/21/17: LVEF 50-55%, no regional wall motion abnormalities; RV mildly dilated but function probably normal; mild to moderate MR; mild to moderate TR; peak PAP 41 mmHg; no pericardial effusion  - EKG:???no ST changes  - troponin negative x 2  - BNP 247 (prior 1,178)  - PTA lasix 40 mg daily, but then given IVF when hypotensive overnight  PLAN  -???continue PTA ASA    #Septic Shock???(resolved)  - arrived to [facility] on pressor, but essentially stopped immediately  -???central venous O2 sat 84%  - hypotensive overnight and briefly on dopamine (d/t bradycardia) -- off since ~ 0200  - SBP 100-140s #Bradycardia; 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 pt aware of risk of aspiration  - SLP recs 12/22: Continue regular solids, thin liquids  #Constipation  -Increased Bowel regimen 12/26:senna/docusate and added MiraLax and 1x bicosadyl enema.  -12/28: poor BM output. Refused 1x Miralax, will attempt again     #AKI on  CKD (resolved)  - baseline Cr 1.3  - Cr 2.4 on arrival at OSH --> 1.3 this AM 12/23  - Will avoid nephrotoxins, renally dose meds, strict I/Os  ???  #DM2  -???PTA regimen:???NPH 14 units Q AM, novolog 4 units TID  - BS 110-160s last 24 hrs  PLAN  - continue 14U NPH daily + low dose correction factor  #Hypothyroidism  -???TSH???1.99 (12/18)  PLAN  -???continue???PTA???synthroid daily  ???  #Septic Shock (resolved)  #HCAP(resolved)  #UTI(resolved)  - WBC???3.9;???afebrile  - hx???resistant e.coli UTI  - per OSH micro tech, no blood cxs were drawn  - urine cx (OSH): + e.coli (resitant to cefazolin, ampicillin, levaquin, augmentin,???cipro; sensitive to  merrem, ceftriaxone & bactrim)  - BAL 12/17???(OSH): usual upper respiratory flora, candida species  -???CT chest (OSH): dense L base consolidation  - blood cx???12/18: NGTD  - sputum cx 12/18: GS w/o organisms; cx NGTD  - UA???12/18: 1+ leuks, + nitrites, 20-50 WBC, few bacteria  - urine cx???12/19: < 100,000 candida albicans  - strep pneumo and legionella urine Ags negative  - procalcitonin???0.22 (12/18)  - no suitable pocket of ascites (on bedside US) for a paracentesis   -ceftriaxone and doxycycline x 7 days (through 12/23)  ???  FEN  - Regular solids, thin liquids, swallow precautions, 1.5L restriction  - tube feedings: dc'd  - IVF: none  - review electrolytes and replace as needed  ???  DVT PPx: SCDs; Heparin  Code Status: Full Code  Disposition/Family: Continue admission to med 2, pending rehab acceptance    Patient seen and discussed with Dr. Smith Mince  ???  Bridget Hartshorn  PGY-1  Pager [phone]     ________________________________________________________________________    Diane Savage is a 61 y.o. female.  No acute events overnight. Patient denies new episodes of chest pain or abdominal pain. Denies headache. SOB with mild improvement, patient laying near flat without significant difficulty. Patient has had 2 large bowel movements. Has gotten up to commode, but not significantly moving much on her own. Improved feeling of tiredness today    Medications  Scheduled Meds:    aspirin chewable tablet 81 mg 81 mg Oral QDAY   dextran 70/hypromellose (NATURAL BALANCE TEARS) 0.1/0.3 % ophthalmic solution 1 drop 1 drop Both Eyes QID   folic acid (FOLVITE) tablet 1 mg 1 mg Oral QDAY   furosemide (LASIX) injection 40 mg 40 mg Intravenous BID(9-17)   heparin (porcine) PF syringe 5,000 Units 5,000 Units Subcutaneous Q8H   insulin aspart U-100 (NOVOLOG FLEXPEN) injection PEN 0-7 Units 0-7 Units Subcutaneous ACHS   insulin NPH (HUMULIN N KwikPen) injection PEN 14 Units 14 Units Subcutaneous QDAY(07)   levothyroxine (SYNTHROID) tablet 175 mcg 175 mcg Oral QDAY(07)   magnesium oxide (MAG-OX) tablet 400 mg 400 mg Oral BID   metoprolol tartrate (LOPRESSOR) tablet 50 mg 50 mg Oral BID   pantoprazole DR (PROTONIX) tablet 40 mg 40 mg Oral BID(11-21)   potassium chloride SR (K-DUR) tablet 20 mEq 20 mEq Oral QDAY(12)   senna/docusate (SENOKOT-S) tablet 2 tablet 2 tablet Oral BID   tiZANidine (ZANAFLEX) tablet 4 mg 4 mg Oral QHS   vitamins, multiple tablet 1 tablet 1 tablet Oral QDAY   Continuous Infusions:  PRN and Respiratory Meds:acetaminophen Q6H PRN, dextromethorphan/guaiFENesin Q4H PRN, polyethylene glycol 3350 BID PRN, prochlorperazine Q6H PRN, sodium chloride PRN, traMADol Q6H PRN      Review of Systems:  Constitutional: +Tired (improved)  Eyes: negative  Ears, nose, mouth, throat,  and face: negative  Respiratory: Negative  Cardiovascular: -Chest pain  Gastrointestinal:Negative  Genitourinary:negative  Integument/breast: negative  Neurologic: Negative  Objective:                          Vital Signs: Last Filed                 Vital Signs: 24 Hour Range   BP: 119/54 (12/29 1200)  Temp: 36.4 ???C (97.5 ???F) (12/29 1200)  Pulse: 82 (12/29 1200)  Respirations: 18 PER MINUTE (12/29 1200)  SpO2: 98 % (12/29 1200)  O2 Delivery: Nasal Cannula (12/29 1200) BP: (92-151)/(48-79)   Temp:  [36.4 ???C (97.5 ???F)-37 ???C (98.6 ???F)]   Pulse:  [81-118]   Respirations:  [18 PER MINUTE-24 PER MINUTE]   SpO2:  [92 %-98 %]   O2 Delivery: Nasal Cannula   Intensity Pain Scale (Self Report): 5 (05/31/17 0915) Vitals:    05/28/17 0600 05/29/17 0346 05/31/17 0500   Weight: 133.8 kg (295 lb) 135.4 kg (298 lb 6.4 oz) 136 kg (299 lb 13.2 oz)       Intake/Output Summary:  (Last 24 hours)    Intake/Output Summary (Last 24 hours) at 05/31/2017 1416  Last data filed at 05/31/2017 1222  Gross per 24 hour   Intake 880 ml   Output 1100 ml   Net -220 ml      Stool Occurrence: 0    Physical Exam General Appearance: Appears overweight  well nourished, stated age and in no distress.   PSYCH: Normal behavior, goal directed thinking, normal speech (rate and volume)  HEENT: Normocephalic and atraumatic, mucous membranes moist. Sclera anicteric bilaterally.   CV: S1 S2 RRR. No murmurs, rubs, clicks, gallops or S4. Distal extremities warm without cyanosis or clubbing. Radial pulses 2+ bilaterally and symmetric  PULM:No wheezes,  rhonchi, increased effort of breathing or diminished lung sounds in anterior lung bases.   ABD: Soft, obese, NTTP.   EXT: 2+ pitting edema to BLE and 1+ to BUE (L>R). No obvious deformity to BLE other than previously noted saphenous vein scarring.  SKIN: Warm and dry to touch. Pallor noted.  NEURO: Tone grossly normal, cranial nerves 2-12 grossly intact, tracks to voice. Alert and interactive.     Lab Review  24-hour labs:    Results for orders placed or performed during the hospital encounter of 05/20/17 (from the past 24 hour(s))   POC GLUCOSE    Collection Time: 05/30/17  5:43 PM   Result Value Ref Range    Glucose, POC 151 (H) 70 - 100 MG/DL   POC GLUCOSE    Collection Time: 05/30/17  9:46 PM   Result Value Ref Range    Glucose, POC 149 (H) 70 - 100 MG/DL   POC GLUCOSE    Collection Time: 05/31/17  3:36 AM   Result Value Ref Range    Glucose, POC 148 (H) 70 - 100 MG/DL   CBC    Collection Time: 05/31/17  6:52 AM   Result Value Ref Range    White Blood Cells 4.2 (L) 4.5 - 11.0 K/UL    RBC 2.57 (L) 4.0 - 5.0 M/UL    Hemoglobin 8.0 (L) 12.0 - 15.0 GM/DL    Hematocrit 16.1 (L) 36 - 45 %    MCV 96.2 80 - 100 FL    MCH 31.2 26 - 34 PG    MCHC 32.4 32.0 - 36.0 G/DL    RDW 09.6 (  H) 11 - 15 %    Platelet Count 103 (L) 150 - 400 K/UL    MPV 8.7 7 - 11 FL   BASIC METABOLIC PANEL    Collection Time: 05/31/17  6:52 AM   Result Value Ref Range    Sodium 137 137 - 147 MMOL/L    Potassium 4.5 3.5 - 5.1 MMOL/L    Chloride 97 (L) 98 - 110 MMOL/L    CO2 35 (H) 21 - 30 MMOL/L    Anion Gap 5 3 - 12 Glucose 137 (H) 70 - 100 MG/DL    Blood Urea Nitrogen 30 (H) 7 - 25 MG/DL    Creatinine 4.54 (H) 0.4 - 1.00 MG/DL    Calcium 9.2 8.5 - 09.8 MG/DL    eGFR Non African American 40 (L) >60 mL/min    eGFR African American 49 (L) >60 mL/min   MAGNESIUM    Collection Time: 05/31/17  6:52 AM   Result Value Ref Range    Magnesium 1.9 1.6 - 2.6 mg/dL   POC GLUCOSE    Collection Time: 05/31/17  8:25 AM   Result Value Ref Range    Glucose, POC 129 (H) 70 - 100 MG/DL   POC GLUCOSE    Collection Time: 05/31/17 11:44 AM   Result Value Ref Range    Glucose, POC 159 (H) 70 - 100 MG/DL       Point of Care Testing  (Last 24 hours)  Glucose: (!) 137 (05/31/17 0652)  POC Glucose (Download): (!) 159 (05/31/17 1144)    Radiology and other Diagnostics Review:    Pertinent radiology reviewed.    Rosezena Sensor, MD   Pager [phone]

## 2017-06-01 LAB — BASIC METABOLIC PANEL
Lab: 134 mg/dL — ABNORMAL HIGH (ref 70–100)
Lab: 139 MMOL/L — ABNORMAL LOW (ref 137–147)
Lab: 27 mg/dL — ABNORMAL HIGH (ref 7–25)
Lab: 39 MMOL/L — ABNORMAL HIGH (ref 21–30)
Lab: 4 pg (ref 3–12)
Lab: 4.1 MMOL/L — ABNORMAL LOW (ref 3.5–5.1)
Lab: 96 MMOL/L — ABNORMAL LOW (ref 98–110)

## 2017-06-01 LAB — CBC: Lab: 4.1 10*3/uL — ABNORMAL LOW (ref 4.5–11.0)

## 2017-06-01 LAB — POC GLUCOSE
Lab: 111 mg/dL — ABNORMAL HIGH (ref 70–100)
Lab: 115 mg/dL — ABNORMAL HIGH (ref 70–100)
Lab: 118 mg/dL — ABNORMAL HIGH (ref 70–100)
Lab: 124 mg/dL — ABNORMAL HIGH (ref 70–100)
Lab: 144 mg/dL — ABNORMAL HIGH (ref 70–100)
Lab: 95 mg/dL (ref 70–100)

## 2017-06-01 LAB — MAGNESIUM: Lab: 1.7 mg/dL (ref 1.6–2.6)

## 2017-06-01 MED ORDER — METOPROLOL TARTRATE 5 MG/5 ML IV SOLN
5 mg | Freq: Once | INTRAVENOUS | 0 refills | Status: CP
Start: 2017-06-01 — End: ?
  Administered 2017-06-02: 02:00:00 5 mg via INTRAVENOUS

## 2017-06-01 MED ORDER — HYDROXYZINE HCL 25 MG PO TAB
25 mg | Freq: Once | ORAL | 0 refills | Status: CP
Start: 2017-06-01 — End: ?
  Administered 2017-06-01: 08:00:00 25 mg via ORAL

## 2017-06-01 MED ORDER — MAGNESIUM SULFATE IN D5W 1 GRAM/100 ML IV PGBK
1 g | INTRAVENOUS | 0 refills | Status: CP
Start: 2017-06-01 — End: ?
  Administered 2017-06-01 (×2): 1 g via INTRAVENOUS

## 2017-06-01 NOTE — Progress Notes
Heart Failure Nursing Progress Note    Admission Date: 05/20/2017  LOS: 12 days    Admission Weight: 132 kg (291 lb 0.1 oz)        Most recent weights (inpatient):   Vitals:    05/29/17 0346 05/31/17 0500 06/01/17 0500   Weight: 135.4 kg (298 lb 6.4 oz) 136 kg (299 lb 13.2 oz) 133.7 kg (294 lb 12.1 oz)     Weight change from previous day: -3kg     Fluid restriction ordered: 1.5    Intake/Output Summary: (Last 24 hours)    Intake/Output Summary (Last 24 hours) at 06/01/2017 9179  Last data filed at 06/01/2017 0500  Gross per 24 hour   Intake 1210 ml   Output 1750 ml   Net -540 ml       Anticipated discharge date: ongoing   Discharge goals: decrease fluid       Daily Assessment of Patient Stated Goals:    Short Term Goal Identified by patient (Short Term=during hospitalization):  Move more

## 2017-06-01 NOTE — Progress Notes
Patient c/o new acid reflux/chest pain. HR sinus tachy 115 - low 120's. Other VSS. Notified physician. New order given for GI cocktail/heating pad. Will continue to monitor and assess.

## 2017-06-01 NOTE — Progress Notes
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 Will need to watch CO2 level closely as it continues to trend up.    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,???moderate TR, moderate LA???enlargement, +???intra-atrial PFO with bidirectional shunt, s/p left atrial appendage ligation  - repeat echo 05/21/17: LVEF 50-55%, no regional wall motion abnormalities; RV mildly dilated but function probably normal; mild to moderate MR; mild to moderate TR; peak PAP 41 mmHg; no pericardial effusion  - EKG:???no ST changes  - troponin negative x 2  - BNP 247 (prior 1,178)  - PTA lasix 40 mg daily, but then given IVF when hypotensive overnight  PLAN  -???Continue PTA ASA    #Septic Shock???(resolved)  - arrived to [facility] on pressor, but essentially stopped immediately  -???central venous O2 sat 84% - hypotensive overnight and briefly on dopamine (d/t bradycardia) -- off since ~ 0200  - SBP 100-140s    #Bradycardia; 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 pt aware of risk of aspiration  - SLP recs 12/22: Continue regular solids, thin liquids    #Constipation  - Increased Bowel regimen 12/26:senna/docusate and added  MiraLax and 1x bicosadyl enema.  - 12/28: poor BM output. Refused 1x Miralax, will attempt again    #AKI on CKD (resolved)  - baseline Cr 1.3  - Cr 2.4 on arrival at OSH --> 1.3 this AM 12/23  - Will avoid nephrotoxins, renally dose meds, strict I/Os  ???  #DM2  -???PTA regimen:???NPH 14 units Q AM, novolog 4 units TID  - BS 110-160s last 24 hrs  PLAN  - continue 14U NPH daily + low dose correction factor    #Hypothyroidism  -???TSH???1.99 (12/18)  PLAN  -???Continue???PTA???synthroid daily  ???  #Septic Shock (resolved)  #HCAP(resolved)  #UTI(resolved)  - WBC???3.9;???afebrile  - hx???resistant e.coli UTI - per OSH micro tech, no blood cxs were drawn  - urine cx (OSH): + e.coli (resitant to cefazolin, ampicillin, levaquin, augmentin,???cipro; sensitive to  merrem, ceftriaxone & bactrim)  - BAL 12/17???(OSH): usual upper respiratory flora, candida species  -???CT chest (OSH): dense L base consolidation  - blood cx???12/18: NGTD  - sputum cx 12/18: GS w/o organisms; cx NGTD  - UA???12/18: 1+ leuks, + nitrites, 20-50 WBC, few bacteria  - urine cx???12/19: < 100,000 candida albicans  - strep pneumo and legionella urine Ags negative  - procalcitonin???0.22 (12/18)  - no suitable pocket of ascites (on bedside US) for a paracentesis   -ceftriaxone and doxycycline x 7 days (through 12/23)  ???  FEN  - Regular solids, thin liquids, swallow precautions, 1.5L restriction  - tube feedings: dc'd  - IVF: none  - review electrolytes and replace as needed  ???  DVT PPx: SCDs; Heparin  Code Status: Full Code  Disposition/Family: Continue admission to med 2, pending rehab acceptance    Patient seen and discussed with Dr. Smith Mince  ???  Leonia Reeves MD  Internal Medicine PGY-2  Pager: [phone]  ________________________________________________________________________    Diane Savage is a 61 y.o. female.  No acute events overnight. Patient had another episode of chest pain last night prior to bed that was associated with increased anxiety. She reports mild improvement with Atarax. Patient continues to have good BM's. Her breathing is doing well, she has no issues with that at this time. Denies light-headedness, dizziness, cough, SOB, abdominal pain, constipation, diarrhea.      Medications  Scheduled Meds:    aspirin chewable tablet 81 mg 81 mg Oral QDAY   dextran 70/hypromellose (NATURAL BALANCE TEARS) 0.1/0.3 % ophthalmic solution 1 drop 1 drop Both Eyes QID   folic acid (FOLVITE) tablet 1 mg 1 mg Oral QDAY   furosemide (LASIX) injection 40 mg 40 mg Intravenous BID(9-17) heparin (porcine) PF syringe 5,000 Units 5,000 Units Subcutaneous Q8H   insulin aspart U-100 (NOVOLOG FLEXPEN) injection PEN 0-7 Units 0-7 Units Subcutaneous ACHS   insulin NPH (HUMULIN N KwikPen) injection PEN 14 Units 14 Units Subcutaneous QDAY(07)   levothyroxine (SYNTHROID) tablet 175 mcg 175 mcg Oral QDAY(07)   magnesium oxide (MAG-OX) tablet 400 mg 400 mg Oral BID   magnesium sulfate   1 g/D5W 100 mL IVPB 1 g Intravenous Q1H X 2DO   metoprolol tartrate (LOPRESSOR) tablet 50 mg 50 mg Oral BID   pantoprazole DR (PROTONIX) tablet 40 mg 40 mg Oral BID(11-21)   potassium chloride SR (K-DUR) tablet 20 mEq 20 mEq Oral QDAY(12)   senna/docusate (SENOKOT-S) tablet 2 tablet 2 tablet Oral BID   tiZANidine (ZANAFLEX) tablet 4 mg 4 mg Oral QHS   vitamins, multiple tablet 1 tablet 1 tablet Oral QDAY   Continuous  Infusions:  PRN and Respiratory Meds:acetaminophen Q6H PRN, dextromethorphan/guaiFENesin Q4H PRN, polyethylene glycol 3350 BID PRN, prochlorperazine Q6H PRN, sodium chloride PRN, traMADol Q6H PRN      Review of Systems:  Constitutional: +Tired (improved)  Eyes: negative  Ears, nose, mouth, throat, and face: negative  Respiratory: Negative  Cardiovascular: +Chest pain  Gastrointestinal:Negative  Genitourinary:negative  Integument/breast: negative  Neurologic: Negative  Objective:                          Vital Signs: Last Filed                 Vital Signs: 24 Hour Range   BP: 133/72 (12/30 1201)  Temp: 36.3 ???C (97.4 ???F) (12/30 1201)  Pulse: 77 (12/30 1249)  Respirations: 18 PER MINUTE (12/30 1249)  SpO2: 97 % (12/30 1249)  O2 Delivery: Nasal Cannula (12/30 1201) BP: (103-154)/(49-72)   Temp:  [36.3 ???C (97.4 ???F)-37 ???C (98.6 ???F)]   Pulse:  [72-125]   Respirations:  [16 PER MINUTE-22 PER MINUTE]   SpO2:  [95 %-98 %]   O2 Delivery: Nasal Cannula   Intensity Pain Scale (Self Report): 5 (06/01/17 0350) Vitals:    05/29/17 0346 05/31/17 0500 06/01/17 0500 Weight: 135.4 kg (298 lb 6.4 oz) 136 kg (299 lb 13.2 oz) 133.7 kg (294 lb 12.1 oz)       Intake/Output Summary:  (Last 24 hours)    Intake/Output Summary (Last 24 hours) at 06/01/2017 1457  Last data filed at 06/01/2017 1400  Gross per 24 hour   Intake 1312 ml   Output 2700 ml   Net -1388 ml      Stool Occurrence: 1    Physical Exam  General Appearance: Appears overweight  well nourished, stated age and in no distress.   PSYCH: Normal behavior, goal directed thinking, normal speech (rate and volume)  HEENT: Normocephalic and atraumatic, mucous membranes moist. Sclera anicteric bilaterally.   CV: S1 S2 RRR. No murmurs, rubs, clicks, gallops or S4. Distal extremities warm without cyanosis or clubbing. Radial pulses 2+ bilaterally and symmetric  PULM: No wheezes, rhonchi, increased effort of breathing, or diminished lung sounds in anterior lung bases.   ABD: Soft, obese, NTTP.   EXT: 2+ pitting edema to BLE and 1+ to BUE (L>R). No obvious deformity to BLE other than previously noted saphenous vein scarring.  SKIN: Warm and dry to touch. Pallor noted.  NEURO: Tone grossly normal, cranial nerves 2-12 grossly intact, tracks to voice. Alert and interactive.     Lab Review  24-hour labs:    Results for orders placed or performed during the hospital encounter of 05/20/17 (from the past 24 hour(s))   POC GLUCOSE    Collection Time: 05/31/17  6:21 PM   Result Value Ref Range    Glucose, POC 118 (H) 70 - 100 MG/DL   POC GLUCOSE    Collection Time: 05/31/17  9:13 PM   Result Value Ref Range    Glucose, POC 115 (H) 70 - 100 MG/DL   POC GLUCOSE    Collection Time: 06/01/17  3:48 AM   Result Value Ref Range    Glucose, POC 111 (H) 70 - 100 MG/DL   POC GLUCOSE    Collection Time: 06/01/17  8:48 AM   Result Value Ref Range    Glucose, POC 95 70 - 100 MG/DL   MAGNESIUM    Collection Time: 06/01/17 11:40 AM   Result Value Ref Range  Magnesium 1.7 1.6 - 2.6 mg/dL   CBC    Collection Time: 06/01/17 11:40 AM   Result Value Ref Range White Blood Cells 4.1 (L) 4.5 - 11.0 K/UL    RBC 2.79 (L) 4.0 - 5.0 M/UL    Hemoglobin 8.7 (L) 12.0 - 15.0 GM/DL    Hematocrit 87.5 (L) 36 - 45 %    MCV 96.5 80 - 100 FL    MCH 31.1 26 - 34 PG    MCHC 32.3 32.0 - 36.0 G/DL    RDW 64.3 (H) 11 - 15 %    Platelet Count 113 (L) 150 - 400 K/UL    MPV 8.6 7 - 11 FL   BASIC METABOLIC PANEL    Collection Time: 06/01/17 11:40 AM   Result Value Ref Range    Sodium 139 137 - 147 MMOL/L    Potassium 4.1 3.5 - 5.1 MMOL/L    Chloride 96 (L) 98 - 110 MMOL/L    CO2 39 (H) 21 - 30 MMOL/L    Anion Gap 4 3 - 12    Glucose 134 (H) 70 - 100 MG/DL    Blood Urea Nitrogen 27 (H) 7 - 25 MG/DL    Creatinine 3.29 (H) 0.4 - 1.00 MG/DL    Calcium 9.6 8.5 - 51.8 MG/DL    eGFR Non African American 47 (L) >60 mL/min    eGFR African American 57 (L) >60 mL/min   POC GLUCOSE    Collection Time: 06/01/17 11:42 AM   Result Value Ref Range    Glucose, POC 124 (H) 70 - 100 MG/DL       Point of Care Testing  (Last 24 hours)  Glucose: (!) 134 (06/01/17 1140)  POC Glucose (Download): (!) 124 (06/01/17 1142)    Radiology and other Diagnostics Review:    Pertinent radiology reviewed.    Raj Janus, MD   Pager [phone]

## 2017-06-02 LAB — POC GLUCOSE
Lab: 119 mg/dL — ABNORMAL HIGH (ref 70–100)
Lab: 156 mg/dL — ABNORMAL HIGH (ref 70–100)
Lab: 157 mg/dL — ABNORMAL HIGH (ref 70–100)
Lab: 169 mg/dL — ABNORMAL HIGH (ref 70–100)
Lab: 197 mg/dL — ABNORMAL HIGH (ref 70–100)

## 2017-06-02 LAB — MAGNESIUM: Lab: 1.8 mg/dL — ABNORMAL LOW (ref >60)

## 2017-06-02 LAB — BASIC METABOLIC PANEL: Lab: 139 MMOL/L — ABNORMAL LOW (ref 137–147)

## 2017-06-02 LAB — CBC: Lab: 3 K/UL — ABNORMAL LOW (ref 4.5–11.0)

## 2017-06-02 MED ORDER — ACETAZOLAMIDE SODIUM 500 MG IJ SOLR
500 mg | Freq: Once | INTRAVENOUS | 0 refills | Status: CN
Start: 2017-06-02 — End: ?

## 2017-06-02 MED ORDER — MELATONIN 3 MG PO TAB
3 mg | Freq: Every evening | ORAL | 0 refills | Status: DC
Start: 2017-06-02 — End: 2017-06-06
  Administered 2017-06-03 – 2017-06-06 (×4): 3 mg via ORAL

## 2017-06-02 MED ORDER — ACETAZOLAMIDE SODIUM 500 MG IJ SOLR
500 mg | Freq: Once | INTRAVENOUS | 0 refills | Status: CP
Start: 2017-06-02 — End: ?
  Administered 2017-06-02: 22:00:00 500 mg via INTRAVENOUS

## 2017-06-02 MED ORDER — ACETAMINOPHEN/LIDOCAINE/ANTACID DS(#) 1:1:3  PO SUSP
30 mL | Freq: Once | ORAL | 0 refills | Status: CP
Start: 2017-06-02 — End: ?
  Administered 2017-06-02: 20:00:00 30 mL via ORAL

## 2017-06-02 MED ORDER — MAGNESIUM SULFATE IN D5W 1 GRAM/100 ML IV PGBK
1 g | Freq: Once | INTRAVENOUS | 0 refills | Status: CP
Start: 2017-06-02 — End: ?
  Administered 2017-06-02: 22:00:00 1 g via INTRAVENOUS

## 2017-06-02 MED ORDER — FUROSEMIDE 10 MG/ML IJ SOLN
40 mg | Freq: Two times a day (BID) | INTRAVENOUS | 0 refills | Status: DC
Start: 2017-06-02 — End: 2017-06-03
  Administered 2017-06-02 – 2017-06-03 (×2): 40 mg via INTRAVENOUS

## 2017-06-02 NOTE — Progress Notes
PHYSICAL THERAPY  PROGRESS NOTE       MOBILITY:  Mobility  Progressive Mobility Level: Stand  Level of Assistance: Assist X2(for safety only)  Assistive Device: Walker  Time Tolerated: 11-30 minutes  Activity Limited By: Dizziness;Fatigue;Weakness    SUBJECTIVE:  Subjective  Significant hospital events: Reason for admission: respiratory failure - medically complex patient PMH: CAD, GI bleed, CABG, heart failure, afib, anemia, HTN, DM, CKD, hypothyroidism, morbid obesity, liver disease, HTN, congestive colopathy, ascites  Mental / Cognitive Status: Alert;Cooperative;Anxious  Persons Present: RehabTechnician;Spouse  Pain: Patient complains of pain;Patient does not rate pain  Pain Location: Abdomen  Pain Interventions: Patient agrees to participate in therapy;Treatment altered to patient's pain tolerance  Comments: Pt was in bed and stated she needed a standing weight.  She reported feeling fatigued.  Comments: 2 liters oxygen  Ambulation Assist: Independent Mobility at Household Level with Device  Patient Owned Equipment: Nurse, adult  Home Situation: Lives with Family  Type of Home: House  Entry Stairs: Ramp  In-Home Stairs: No Stairs    BED MOBILITY/TRANSFERS:  Bed Mobility/Transfers  Bed Mobility: Supine to Sit: Minimal Assist;Head of Bed Elevated;Use of Rail;Assist with Trunk  Bed Mobility: Sit to Supine: Moderate Assist;Verbal Cues;Bed Flat;Assist with B LE  Transfer Type: Stand Pivot  Transfer: Assistance Level: Bed;To/From;Commode;x2 People(hands on for safety only due to report of dizziness)  Transfer: Assistive Device: Nurse, adult  Transfers: Type Of Assistance: Materials engineer;For Safety Considerations  Other Transfer Type: Sit to/from Stand  Other Transfer: Assistance Level: To/From;Bed;Commode;x2 People(hands on for safety due to report of dizziness)  Other Transfer: Assistive Device: Chief of Staff Assist  Other Transfer: Type Of Assistance: Verbal Cues;For Balance;For Safety Considerations End Of Activity Status: In Bed;Instructed Patient to Request Assist with Mobility;Instructed Patient to Use Call Light  Comments: assist x 2 for safety considerations due to report of dizziness.    GAIT:  Gait  Comments: pt took several steps between the bed and commode and was able to take a step up onto the scale.  Activity Limited By: Complaint of Fatigue;Complaint of Dizziness;Weakness;SOA  Comments: Pt reported feeling SOA and dizzy through the session.  Saturations ranged between 90-94 and HR fluctuated between upper 80's to lower 90's.    ASSESSMENT/PROGRESS:  Assessment/Progress  Comments: Pt is limited by deconditioning and decreased strength.  Anticipate pt would benefit from continued therapy in an inpatient setting.  AM-PAC 6 Clicks Basic Mobility Inpatient  Turning from your back to your side while in a flat bed without using bed rails: A Little  Moving from lying on your back to sitting on the side of a flatbed without using bedrails : A Lot  Moving to and from a bed to a chair (including a wheelchair): A Little  Standing up from a chair using your arms (e.g. wheelchair, or bedside chair): A Little  To walk in hospital room: A Lot  Climbing 3-5 steps with a railing: Total  Raw Score: 14  Standardized (T-scale) Score: 35.55  Basic Mobility CMS 0-100%: 53.86  CMS G Code Modifier for Basic Mobility: CK    GOALS:  Goals  Goal Formulation: With Patient  Time For Goal Achievement: 5 days  Pt Will Go Supine To/From Sit: w/ Stand By Assist  Pt Will Transfer Bed/Chair: w/ Stand By Assist  Pt Will Transfer Sit to Stand: w/ Stand By Assist  Pt Will Ambulate: 1-10 Feet, w/ Dan Humphreys, w/ Minimal Assist    PLAN:  Plan   Treatment Interventions: Mobility Training;Strengthening;Balance  Activities  Plan Frequency: 5 Days per Week  PT Plan for Next Visit: *short distance ambulation with chair follow    RECOMMENDATIONS:  PT Discharge Recommendations  PT Discharge Recommendations: Inpatient Setting Therapist: Norva Karvonen, Physical therapist assistant  Date: 06/02/2017

## 2017-06-02 NOTE — Progress Notes
Heart Failure Nursing Progress Note    Admission Date: 05/20/2017  LOS: 13 days    Admission Weight: 132 kg (291 lb 0.1 oz)        Most recent weights (inpatient):   Vitals:    06/02/17 0500 06/02/17 1543 06/02/17 1645   Weight: 133.7 kg (294 lb 12.1 oz) 85.8 kg (189 lb 2.5 oz) 115.5 kg (254 lb 10.1 oz)     Weight change from previous XFQ:HKUVJD of weight accuracy     Fluid restriction ordered: 1.5 L     Intake/Output Summary: (Last 24 hours)    Intake/Output Summary (Last 24 hours) at 06/02/2017 1700  Last data filed at 06/02/2017 1657  Gross per 24 hour   Intake 480 ml   Output 1450 ml   Net -970 ml       Is patient incontinent No    Anticipated discharge date: tomorrow  Discharge goals: overall health      Daily Assessment of Patient Stated Goals:    Short Term Goal Identified by patient (Short Term=during hospitalization):  Get home

## 2017-06-02 NOTE — Progress Notes
OCCUPATIONAL THERAPY  PROGRESS NOTE    Patient Name: Diane Savage                   Room/Bed: [id number]/78  Admitting Diagnosis:  Resp failure      Subjective  Pertinent Dx per Physician:  PMH CAD s/p CABG 02/2017, HF, afib s/p L atrial appendage ligation not on AC d/t recurrent GIBs c/b transfusion-dependent anemia, liver disease c/b portal HTN, congestive colopathy, EV, GAVE & ascites. Admitted as transfer 12/18 after presenting there on 12/16 for syncope/fatigue. Admit c/b AMS/respiratory failure requiring intubation, afib w/ RVR & septic shock from HCAP + UTI. Extubated 12/19.  Precautions: Falls;O2 Requirement  Comments: 2L O2 NC  Pain Location: (Chest pain)    Objective  Persons Present: Spouse;Nursing Staff    Home Living  Type of Home: House  Home Layout: One Level;Ramped Environmental education officer / Tub: Pensions consultant: Environmental consultant    Prior Function  Level Of Independence: Independent with ADLs and functional transfers  Lives With: Spouse  Receives Help From: None Needed  Other Function Comments: Pt reports 2 recent falls in the bathroom, falling while trying to transfer off the toilet.    ADL's  Where Assessed: Supine, Bed  Grooming Assist: Stand By Assist  Grooming Deficits: Setup;Wash/Dry Hands;Wash/Dry Face;Teeth Care;Denture Care  Comment: Patient politely requests no out of bed activity at this time due to having chest pain (has had ongoing tachycardia today). Patient agreeable to complete grooming/hygiene in bed. Patient states she has already been up to the commode with nursing saff today and has stood on the standing scale.     Activity Tolerance  Endurance: 2/5 Tolerates 10-20 Minutes Exercise w/Multiple Rests    AM-PAC 6 Clicks Daily Activity Inpatient  Putting on and taking off regular lower body clothes?: Total  Bathing (Including washing, rinsing, drying): A Lot  Toileting, which includes using toilet, bedpan, or urinal: Total Putting on and taking off regular upper body clothing: A Lot  Taking care of personal grooming such as brushing teeth: None  Eating meals?: None  Daily Activity Raw Score: 14  Standardized (t-scale) score: 33.39  CMS 0-100% Score: 59.67  CMS G Code Modifier: CK    Plan   OT Plan for next session: address lower body dressing, grooming/hygiene edge of bed      ADL Goals  Patient Will Perform Grooming: at Edge of Bed;in Chair;w/ Stand By Assist  Patient Will Perform LE Dressing: w/ Minimum Assist  Patient Will Perform Toileting: w/ Minimum Assist    Functional Transfer Goals  Pt Will Perform All Functional Transfers: Minimum Assist      OT Discharge Recommendations  OT Discharge Recommendations: Inpatient Setting    Recommend ongoing assistance for: In and out of house, Transfers, Bed mobility, Ambulation, Stairs          Therapist: Theotis Burrow, [place] [id number]  Date: 06/02/2017

## 2017-06-02 NOTE — Progress Notes
Patients c/o chest pain. HR was in 130s.  IV metoprolol given. Patients HR is now 100-110. MD on call notified. No new orders at this time. Will continue to assess and monitor patient.

## 2017-06-02 NOTE — Progress Notes
Shift: Night    Mentation: A/O X 4    Cardiac: Was ST 130s, now is a. fib rate controlled    Respiratory: 2L NC; Continuous pulse ox; SOB on exertion    GI/GU: Voids; last BM 12/30    Nutrition: Cardiac/ADA diet with 1.5 L fluid restriction    Activity: Assist X 1 with walker; Q2 turns    Pain: Patient c/o chest and back pain. Patient desires no intervention    Family: Husband at bedside    Follow up: Discharge plan ongoing

## 2017-06-02 NOTE — Progress Notes
Heart Failure Nursing Progress Note    Admission Date: 05/20/2017  LOS: 12 days    Admission Weight: 132 kg (291 lb 0.1 oz)        Most recent weights (inpatient):   Vitals:    05/29/17 0346 05/31/17 0500 06/01/17 0500   Weight: 135.4 kg (298 lb 6.4 oz) 136 kg (299 lb 13.2 oz) 133.7 kg (294 lb 12.1 oz)     Weight change from previous day: lost 2.3 kg    Fluid restriction ordered: 1500    Intake/Output Summary: (Last 24 hours)    Intake/Output Summary (Last 24 hours) at 06/01/2017 1838  Last data filed at 06/01/2017 1709  Gross per 24 hour   Intake 1722 ml   Output 3050 ml   Net -1328 ml       Is patient incontinent No    Anticipated discharge date: 12/1  Discharge goals: To go home.      Daily Assessment of Patient Stated Goals:    Short Term Goal Identified by patient (Short Term=during hospitalization):      Pt is extremely SOA, especially with exertion.  However, oxygen requirement not going up.  Pt has LE, 2-3+, is getting IV lasix BID, is having output, is abiding by fluid restriction.  Pt HR 130's with activity.  Pt also complains of extreme fatigue.

## 2017-06-02 NOTE — Progress Notes
Heart Failure Nursing Progress Note    Admission Date: 05/20/2017  LOS: 13 days    Admission Weight: 132 kg (291 lb 0.1 oz)        Most recent weights (inpatient):   Vitals:    05/31/17 0500 06/01/17 0500 06/02/17 0500   Weight: 136 kg (299 lb 13.2 oz) 133.7 kg (294 lb 12.1 oz) 133.7 kg (294 lb 12.1 oz)     Weight change from previous day: None    Fluid restriction ordered: 1.5 L    Intake/Output Summary: (Last 24 hours)    Intake/Output Summary (Last 24 hours) at 06/02/2017 7517  Last data filed at 06/02/2017 0017  Gross per 24 hour   Intake 1232 ml   Output 2850 ml   Net -1618 ml       Is patient incontinent No    Anticipated discharge date: 1/2  Discharge goals: To go to rehab

## 2017-06-02 NOTE — Progress Notes
EKG this evening showing HR in 130's as sinus tachycardia.  Med 2 paged a little earlier and came straight over to assess patient after RN reviewing telemetry and noticing tachycardia since 1700.  Pt also SOA on E.  IV metoprolol ordered.

## 2017-06-02 NOTE — Progress Notes
General Progress Note    Name:  Diane Savage   [id number] Date:  06/02/2017  Admission Date: 05/20/2017  LOS: 13 days                     Assessment/Plan:    Active Problems:    Type 2 diabetes mellitus with circulatory disorder, without long-term current use of insulin (HCC)    PAF (paroxysmal atrial fibrillation) (HCC)    Atrial fibrillation with rapid ventricular response (HCC)    Chronic gastrointestinal bleeding    HLD (hyperlipidemia)    Essential hypertension    Acute on chronic diastolic heart failure due to coronary artery disease (HCC)    Morbid obesity (HCC)      Interval:  - Patient with episode of chest pain around noon. Same quality as previous episodes, which have had negative troponin and EKG. Low suspicion for cardiac etiology. Possibly 2/2 GAVE/GI cause. Will attempt GI cocktail for symptomatic relief.   -Bicarb mildly elevated. Will hold afternoon lasix, diamox instead.     #Acute Hypercarbic???& Hypoxemic???Respiratory???Failure (Improved)  #L Pleural Effusion  - d/t HCAP   -???CT chest (OSH):???circumferential???L???pleural effusion???&???densely consolidated???L???base???w/???air bronchograms  - extubated 12/19  - currently on 2 L/NC, satting well, was discharged home on 10/25 on room air from HF service, but states she had 2L O2 prior to admission  PLAN  - Holding PM dose of lasix today, will give diamox 500 mg due to elevated bicarb. Likely resume lasix tomorrow.   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,???moderate TR, moderate LA???enlargement, +???intra-atrial PFO with bidirectional shunt, s/p left atrial appendage ligation  - repeat echo 05/21/17: LVEF 50-55%, no regional wall motion abnormalities; RV mildly dilated but function probably normal; mild to moderate MR; mild to moderate TR; peak PAP 41 mmHg; no pericardial effusion  - EKG:???no ST changes  - troponin negative x 2 - BNP 247 (prior 1,178)  - PTA lasix 40 mg daily, but then given IVF when hypotensive overnight  PLAN  -???Continue PTA ASA    #Septic Shock???(resolved)  - arrived to [place] on pressor, but essentially stopped immediately  -???central venous O2 sat 84%  - hypotensive overnight and briefly on dopamine (d/t bradycardia) -- off since ~ 0200  - SBP 100-140s    #Bradycardia; 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   -???Continue???PTA PPI  - Transfuse for hemoglobin <7    #Liver Disease  -???c/b???portal???HTN, congestive colopathy, EV,???GAVE???&???ascites  - limited prior w/u; was to see hepatology in clinic in January  - [place] 02/2017: diffuse hepatic steatosis  -  EGD as above  - MELD-Na 10    #Dysphagia  - Moderate oropharyngeal dysphagia per SLP  - Corpak removed. pt aware of risk of aspiration  - SLP recs 12/22: Continue regular solids, thin liquids    #Constipation-resolved  - Increased Bowel regimen 12/26:senna/docusate and added MiraLax and 1x bicosadyl enema.  -12/31: Good BM output at this time.     #AKI on CKD (resolved)  - baseline Cr 1.3  - Cr 2.4 on arrival at OSH --> 1.3 this AM 12/23  - Will avoid nephrotoxins, renally dose meds, strict I/Os  ???  #DM2  -???PTA regimen:???NPH 14 units Q AM, novolog 4 units TID  - BS 110-160s last 24 hrs PLAN  - continue 14U NPH daily + low dose correction factor    #Hypothyroidism  -???TSH???1.99 (12/18)  PLAN  -???Continue???PTA???synthroid daily  ???  #Septic Shock (resolved)  #HCAP(resolved)  #UTI(resolved)  - WBC???3.9;???afebrile  - hx???resistant e.coli UTI  - per OSH micro tech, no blood cxs were drawn  - urine cx (OSH): + e.coli (resitant to cefazolin, ampicillin, levaquin, augmentin,???cipro; sensitive to  merrem, ceftriaxone & bactrim)  - BAL 12/17???(OSH): usual upper respiratory flora, candida species  -???CT chest (OSH): dense L base consolidation  - blood cx???12/18: NGTD  - sputum cx 12/18: GS w/o organisms; cx NGTD  - UA???12/18: 1+ leuks, + nitrites, 20-50 WBC, few bacteria  - urine cx???12/19: < 100,000 candida albicans  - strep pneumo and legionella urine Ags negative  - procalcitonin???0.22 (12/18)  - no suitable pocket of ascites (on bedside US) for a paracentesis   -ceftriaxone and doxycycline x 7 days (through 12/23)  ???  FEN  - Regular solids, thin liquids, swallow precautions, 1.5L restriction  - tube feedings: dc'd  - IVF: none  - review electrolytes and replace as needed  ???  DVT PPx: SCDs; Heparin  Code Status: Full Code  Disposition/Family: Continue admission to med 2, pending rehab acceptance    Patient seen and discussed with Dr. Smith Mince  ???  Bridget Hartshorn  PGY-1  Pager [phone]    ________________________________________________________________________    Diane Savage is a 61 y.o. female. No acute events overnight. Patient endorses some abdominal pain today. Last meal last night, 2 BMs in the past day. Also endorses poor appetite. States tongue feels painful, similar to last episode of thrush. Patient would like to discharge home, but understands that insurance is pending and [facility] won't accept this. Okay to go to [facility] rehab.     Patient had an episode of chest pain around noon today. States it started approximately 45 minutes prior to my evaluation. Pain is sub-xiphoid; patient has tenderness to palpation in that location, but states that the tenderness feels different from her current pain. Pain is similar to prior episodes. Denies excessive anxiety. Denies any exacerbating factors. States that it usually improves with transfusions.     Medications  Scheduled Meds:    aspirin chewable tablet 81 mg 81 mg Oral QDAY   dextran 70/hypromellose (NATURAL BALANCE TEARS) 0.1/0.3 % ophthalmic solution 1 drop 1 drop Both Eyes QID   folic acid (FOLVITE) tablet 1 mg 1 mg Oral QDAY   furosemide (LASIX) injection 40 mg 40 mg Intravenous BID(9-17)   heparin (porcine) PF syringe 5,000 Units 5,000 Units Subcutaneous Q8H   insulin aspart U-100 (NOVOLOG FLEXPEN) injection PEN 0-7 Units 0-7 Units Subcutaneous ACHS   insulin NPH (HUMULIN N KwikPen) injection PEN 14 Units 14 Units Subcutaneous QDAY(07)  levothyroxine (SYNTHROID) tablet 175 mcg 175 mcg Oral QDAY(07)   magnesium oxide (MAG-OX) tablet 400 mg 400 mg Oral BID   metoprolol tartrate (LOPRESSOR) tablet 50 mg 50 mg Oral BID   pantoprazole DR (PROTONIX) tablet 40 mg 40 mg Oral BID(11-21)   potassium chloride SR (K-DUR) tablet 20 mEq 20 mEq Oral QDAY(12)   senna/docusate (SENOKOT-S) tablet 2 tablet 2 tablet Oral BID   tiZANidine (ZANAFLEX) tablet 4 mg 4 mg Oral QHS   vitamins, multiple tablet 1 tablet 1 tablet Oral QDAY   Continuous Infusions:  PRN and Respiratory Meds:acetaminophen Q6H PRN, dextromethorphan/guaiFENesin Q4H PRN, polyethylene glycol 3350 BID PRN, prochlorperazine Q6H PRN, sodium chloride PRN, traMADol Q6H PRN      Review of Systems:  Constitutional: +tired  Eyes: negative  Ears, nose, mouth, throat, and face: negative  Respiratory: Negative  Cardiovascular: +Chest pain  Gastrointestinal: +Abd pain  Genitourinary:negative  Neurologic: Negative  Objective:                          Vital Signs: Last Filed                 Vital Signs: 24 Hour Range   BP: 129/52 (12/31 0845)  Temp: 36.7 ???C (98 ???F) (12/31 0845)  Pulse: 83 (12/31 0852) Respirations: 18 PER MINUTE (12/31 0852)  SpO2: 97 % (12/31 0852)  O2 Delivery: Nasal Cannula (12/31 0845) BP: (108-147)/(49-72)   Temp:  [36.3 ???C (97.4 ???F)-37.7 ???C (99.8 ???F)]   Pulse:  [72-132]   Respirations:  [18 PER MINUTE-22 PER MINUTE]   SpO2:  [92 %-100 %]   O2 Delivery: Nasal Cannula   Intensity Pain Scale (Self Report): 2 (06/02/17 4782) Vitals:    05/31/17 0500 06/01/17 0500 06/02/17 0500   Weight: 136 kg (299 lb 13.2 oz) 133.7 kg (294 lb 12.1 oz) 133.7 kg (294 lb 12.1 oz)       Intake/Output Summary:  (Last 24 hours)    Intake/Output Summary (Last 24 hours) at 06/02/2017 1101  Last data filed at 06/02/2017 9562  Gross per 24 hour   Intake 992 ml   Output 2450 ml   Net -1458 ml      Stool Occurrence: 0    Physical Exam  General Appearance: Appears overweight  well nourished, stated age and in no distress.   PSYCH: Normal behavior, goal directed thinking, normal speech (rate and volume)  HEENT: Normocephalic and atraumatic, mucous membranes moist. Sclera anicteric bilaterally.   CV: S1 S2 RRR. No murmurs, rubs, clicks, gallops or S4. Distal extremities warm without cyanosis or clubbing. Radial pulses 2+ bilaterally and symmetric. On re-exam, chest wall was tender to palpation in subxiphoid and epigastric region.   PULM: No wheezes, rhonchi, increased effort of breathing, or diminished lung sounds in anterior lung bases.   ABD: Soft, obese, TTP in RUQ and Periumbilical.   EXT: 2+ pitting edema to BLE and 1+ to BUE (L>R) with wrinkling noted. No obvious deformity to BLE other than previously noted saphenous vein scarring.   SKIN: Warm and dry to touch. Pallor noted.  NEURO: Tone grossly normal, cranial nerves 2-12 grossly intact, tracks to voice. Alert and interactive.     Lab Review  24-hour labs:    Results for orders placed or performed during the hospital encounter of 05/20/17 (from the past 24 hour(s))   MAGNESIUM    Collection Time: 06/01/17 11:40 AM   Result Value Ref Range Magnesium 1.7 1.6 -  2.6 mg/dL   CBC    Collection Time: 06/01/17 11:40 AM   Result Value Ref Range    White Blood Cells 4.1 (L) 4.5 - 11.0 K/UL    RBC 2.79 (L) 4.0 - 5.0 M/UL    Hemoglobin 8.7 (L) 12.0 - 15.0 GM/DL    Hematocrit 16.1 (L) 36 - 45 %    MCV 96.5 80 - 100 FL    MCH 31.1 26 - 34 PG    MCHC 32.3 32.0 - 36.0 G/DL    RDW 09.6 (H) 11 - 15 %    Platelet Count 113 (L) 150 - 400 K/UL    MPV 8.6 7 - 11 FL   BASIC METABOLIC PANEL    Collection Time: 06/01/17 11:40 AM   Result Value Ref Range    Sodium 139 137 - 147 MMOL/L    Potassium 4.1 3.5 - 5.1 MMOL/L    Chloride 96 (L) 98 - 110 MMOL/L    CO2 39 (H) 21 - 30 MMOL/L    Anion Gap 4 3 - 12    Glucose 134 (H) 70 - 100 MG/DL    Blood Urea Nitrogen 27 (H) 7 - 25 MG/DL    Creatinine 0.45 (H) 0.4 - 1.00 MG/DL    Calcium 9.6 8.5 - 40.9 MG/DL    eGFR Non African American 47 (L) >60 mL/min    eGFR African American 57 (L) >60 mL/min   POC GLUCOSE    Collection Time: 06/01/17 11:42 AM   Result Value Ref Range    Glucose, POC 124 (H) 70 - 100 MG/DL   POC GLUCOSE    Collection Time: 06/01/17  4:35 PM   Result Value Ref Range    Glucose, POC 144 (H) 70 - 100 MG/DL   POC GLUCOSE    Collection Time: 06/01/17  8:21 PM   Result Value Ref Range    Glucose, POC 156 (H) 70 - 100 MG/DL   POC GLUCOSE    Collection Time: 06/02/17  3:33 AM   Result Value Ref Range    Glucose, POC 197 (H) 70 - 100 MG/DL   POC GLUCOSE    Collection Time: 06/02/17  7:48 AM   Result Value Ref Range    Glucose, POC 157 (H) 70 - 100 MG/DL   CBC    Collection Time: 06/02/17  7:49 AM   Result Value Ref Range    White Blood Cells 3.0 (L) 4.5 - 11.0 K/UL    RBC 2.44 (L) 4.0 - 5.0 M/UL    Hemoglobin 7.8 (L) 12.0 - 15.0 GM/DL    Hematocrit 81.1 (L) 36 - 45 %    MCV 96.7 80 - 100 FL    MCH 32.0 26 - 34 PG    MCHC 33.1 32.0 - 36.0 G/DL    RDW 91.4 (H) 11 - 15 %    Platelet Count 91 (L) 150 - 400 K/UL    MPV 8.4 7 - 11 FL   BASIC METABOLIC PANEL    Collection Time: 06/02/17  7:49 AM   Result Value Ref Range Sodium 139 137 - 147 MMOL/L    Potassium 4.0 3.5 - 5.1 MMOL/L    Chloride 96 (L) 98 - 110 MMOL/L    CO2 40 (H) 21 - 30 MMOL/L    Anion Gap 3 3 - 12    Glucose 154 (H) 70 - 100 MG/DL    Blood Urea Nitrogen 23 7 - 25 MG/DL  Creatinine 1.10 (H) 0.4 - 1.00 MG/DL    Calcium 9.3 8.5 - 96.0 MG/DL    eGFR Non African American 51 (L) >60 mL/min    eGFR African American >60 >60 mL/min   MAGNESIUM    Collection Time: 06/02/17  7:49 AM   Result Value Ref Range    Magnesium 1.8 1.6 - 2.6 mg/dL   CHEM 7 ADD ON    Collection Time: 06/02/17  7:49 AM   Result Value Ref Range    Total Protein 5.8 (L) 6.0 - 8.0 G/DL    Total Bilirubin 0.7 0.3 - 1.2 MG/DL    Albumin 2.6 (L) 3.5 - 5.0 G/DL    Alk Phosphatase 45 25 - 110 U/L    AST (SGOT) 18 7 - 40 U/L    ALT (SGPT) 4 (L) 7 - 56 U/L       Point of Care Testing  (Last 24 hours)  Glucose: (!) 154 (06/02/17 0749)  POC Glucose (Download): (!) 157 (06/02/17 4540)    Radiology and other Diagnostics Review:    Pertinent radiology reviewed.    Rosezena Sensor, MD   Pager [phone]

## 2017-06-02 NOTE — Progress Notes
06/02/17 1220   Vitals   Temp 36.9 C (98.5 F)   Temperature Source Oral   Pulse 86   Respirations 20 PER MINUTE   SpO2 100 %   O2 Delivery NC   O2 Liter Flow 2 lpm   BP 119/47   Mean NBP (Calculated) 71 MM HG   BP Source Arm, Left Upper   BP Patient Position Head of bed (Comment degree)   Pt complaining of chest pain 5/5 and SOA. Possibly in a-flutter on tele. Team notified Dr. At bedside.

## 2017-06-02 NOTE — Progress Notes
Shift: Day    Mentation: alert and oriented x4    Cardiac: SR, a-fib, a-flutter. Chest pain relieved with GI cocktail    Respiratory: 2L, SOA with exertion     GI/GU: voids, BM today    Nutrition: cardiac, ADA, 1.5 L fluid restriction    Activity: up with assist x1    Pain: back pain resolved    Family: at bedside    Hygiene: self care with staff assist as needed    Follow up: care plan on-going

## 2017-06-03 LAB — POC GLUCOSE
Lab: 223 mg/dL — ABNORMAL HIGH (ref 70–100)
Lab: 181 mg/dL — ABNORMAL HIGH (ref 70–100)
Lab: 153 mg/dL — ABNORMAL HIGH (ref 70–100)
Lab: 137 mg/dL — ABNORMAL HIGH (ref 70–100)
Lab: 129 mg/dL — ABNORMAL HIGH (ref 70–100)

## 2017-06-03 LAB — CBC: Lab: 4.6 K/UL (ref 4.5–11.0)

## 2017-06-03 LAB — MAGNESIUM: Lab: 1.9 mg/dL — ABNORMAL LOW (ref 1.6–2.6)

## 2017-06-03 LAB — BASIC METABOLIC PANEL
Lab: 138 MMOL/L — ABNORMAL LOW (ref >60)
Lab: 37 MMOL/L — ABNORMAL HIGH (ref 21–30)

## 2017-06-03 MED ORDER — ALBUTEROL SULFATE 2.5 MG/0.5 ML IN NEBU
2.5 mg | RESPIRATORY_TRACT | 0 refills | Status: DC | PRN
Start: 2017-06-03 — End: 2017-06-06

## 2017-06-03 MED ORDER — MAGNESIUM SULFATE IN D5W 1 GRAM/100 ML IV PGBK
1 g | Freq: Once | INTRAVENOUS | 0 refills | Status: CP
Start: 2017-06-03 — End: ?
  Administered 2017-06-03: 16:00:00 1 g via INTRAVENOUS

## 2017-06-03 MED ORDER — PROCHLORPERAZINE EDISYLATE 5 MG/ML IJ SOLN
10 mg | Freq: Once | INTRAVENOUS | 0 refills | Status: CP
Start: 2017-06-03 — End: ?
  Administered 2017-06-03: 18:00:00 10 mg via INTRAVENOUS

## 2017-06-03 MED ORDER — SENNOSIDES-DOCUSATE SODIUM 8.6-50 MG PO TAB
1 | Freq: Two times a day (BID) | ORAL | 0 refills | Status: DC
Start: 2017-06-03 — End: 2017-06-06
  Administered 2017-06-05 – 2017-06-06 (×4): 1 via ORAL

## 2017-06-03 MED ORDER — DIPHENHYDRAMINE HCL 50 MG/ML IJ SOLN
25 mg | Freq: Once | INTRAVENOUS | 0 refills | Status: CP
Start: 2017-06-03 — End: ?
  Administered 2017-06-03: 18:00:00 25 mg via INTRAVENOUS

## 2017-06-03 MED ORDER — SIMETHICONE 80 MG PO CHEW
80 mg | ORAL | 0 refills | Status: DC | PRN
Start: 2017-06-03 — End: 2017-06-06
  Administered 2017-06-03: 07:00:00 80 mg via ORAL

## 2017-06-03 MED ORDER — IPRATROPIUM BROMIDE 0.02 % IN SOLN
0.5 mg | RESPIRATORY_TRACT | 0 refills | Status: DC | PRN
Start: 2017-06-03 — End: 2017-06-06

## 2017-06-03 MED ADMIN — ALBUTEROL SULFATE 2.5 MG/0.5 ML IN NEBU [93139]: 2.5 mg | RESPIRATORY_TRACT | @ 19:00:00 | Stop: 2017-06-03 | NDC 00487990130

## 2017-06-03 MED ADMIN — IPRATROPIUM BROMIDE 0.02 % IN SOLN [12580]: RESPIRATORY_TRACT | @ 19:00:00 | Stop: 2017-06-03 | NDC 00487980101

## 2017-06-03 NOTE — Progress Notes
RT Adult Assessment Note    NAME:Diane Savage             MRN: [id number]             DOB:04/02/1956          AGE: 62 y.o.  ADMISSION DATE: 05/20/2017             DAYS ADMITTED: LOS: 14 days    RT Treatment Plan:  Protocol Plan: Medications  Albuterol/Ipratropium: Neb PRN    Protocol Plan: Procedures  IPPB: Place a nursing order for "IS Q1h While Awake" for any of Lung Expansion indicators  Oxygen/Humidity: O2 to keep SpO2 > 92%  Monitoring: Pulse oximetry BID & PRN    Additional Comments:  Impressions of the patient: Patient complaining of SOA, oxygenation 97% on 2 lpm (home reg)  Intervention(s)/outcome(s): Breathing treatment given for slight wheeze in base, patient report no work of breathing change post treatment, fine crackles and wheezing still heard  Patient education that was completed: breathing treatment education  Recommendations to the care team: patient may need a recent CXR, Talked with RN Vaughan Basta about patient, team aware of patient SOA.  Will continue to follow patient on Respiratory protocol, ordered treatments PRN    Vital Signs:  Pulse: Pulse: 85  RR: Respirations: 22 PER MINUTE  SpO2: SpO2: 98 %  O2 Device: $$ O2 Device: Cannula  Liter Flow: O2 Liter Flow: 2 lpm  O2%:    Breath Sounds:    Respiratory Effort: Respiratory Effort: SOA (Short of Air);SOA (Short of Air) on Exertion(pt complaining of SOA)

## 2017-06-03 NOTE — Progress Notes
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   -Will hold lasix for now  -Added benadryl, compazine for headache  -Decreased senna/docusate due to loose stool.     #Acute Hypercarbic???& Hypoxemic???Respiratory???Failure (Improved)  #L Pleural Effusion  - d/t HCAP   -???CT chest (OSH):???circumferential???L???pleural effusion???&???densely consolidated???L???base???w/???air bronchograms  - extubated 12/19  - currently on 2 L/NC, satting well, was discharged home on 10/25 on room air from HF service, but states she had 2L O2 prior to admission  PLAN  - Hold diuresis today. Patient had diamox yesterday and may have had decreased ICP resulting in headache.   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,???moderate TR, moderate LA???enlargement, +???intra-atrial PFO with bidirectional shunt, s/p left atrial appendage ligation  - repeat echo 05/21/17: LVEF 50-55%, no regional wall motion abnormalities; RV mildly dilated but function probably normal; mild to moderate MR; mild to moderate TR; peak PAP 41 mmHg; no pericardial effusion  - EKG:???no ST changes  - troponin negative x 2  - BNP 247 (prior 1,178) - PTA lasix 40 mg daily, but then given IVF when hypotensive overnight  PLAN  -???Continue PTA ASA    #Septic Shock???(resolved)  - arrived to [place] on pressor, but essentially stopped immediately  -???central venous O2 sat 84%  - SBP 100-140s    #Bradycardia; 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 pt aware of risk of aspiration  - SLP recs  12/22: Continue regular solids, thin liquids    #Constipation-resolved  - Increased Bowel regimen 12/26:senna/docusate and added MiraLax and 1x bicosadyl enema.  -12/31: Good BM output at this time.     #AKI on CKD (resolved)  - baseline Cr 1.3  - Cr 2.4 on arrival at OSH --> 1.3 by 12/23, around baseline since then  - Will avoid nephrotoxins, renally dose meds, strict I/Os  ???  #DM2  -???PTA regimen:???NPH 14 units Q AM, novolog 4 units TID  - BS 110-160s last 24 hrs  PLAN  - continue 14U NPH daily + low dose correction factor    #Hypothyroidism  -???TSH???1.99 (12/18)  PLAN  -???Continue???PTA???synthroid daily  ??? #Septic Shock (resolved)  #HCAP(resolved)  #UTI(resolved)  - WBC???3.9;???afebrile  - hx???resistant e.coli UTI  - per OSH micro tech, no blood cxs were drawn  - urine cx (OSH): + e.coli (resitant to cefazolin, ampicillin, levaquin, augmentin,???cipro; sensitive to  merrem, ceftriaxone & bactrim)  - BAL 12/17???(OSH): usual upper respiratory flora, candida species  -???CT chest (OSH): dense L base consolidation  - blood cx???12/18: NGTD  - sputum cx 12/18: GS w/o organisms; cx NGTD  - UA???12/18: 1+ leuks, + nitrites, 20-50 WBC, few bacteria  - urine cx???12/19: < 100,000 candida albicans  - strep pneumo and legionella urine Ags negative  - procalcitonin???0.22 (12/18)  - no suitable pocket of ascites (on bedside US) for a paracentesis   -ceftriaxone and doxycycline x 7 days (through 12/23)  ???  FEN  - Regular solids, thin liquids, swallow precautions, 1.5L restriction  - tube feedings: dc'd  - IVF: none  - review electrolytes and replace as needed  ???  DVT PPx: SCDs; Heparin  Code Status: Full Code  Disposition/Family: Continue admission to med 2, pending rehab acceptance    Patient seen and discussed with Dr. Smith Mince  ???  Bridget Hartshorn  PGY-1  Pager [phone]    ________________________________________________________________________    Diane Savage is a 62 y.o. female. Overnight, patient had abdominal pain with gas, received simethicone. When seen this morning, patient still with abdominal pain and similar epigastric/subxiphoid pain. Says GI cocktail the day prior had helped her symptoms somewhat and temporarily. Appetite has been poor. BMs have generally been soft, however last BM was loose. Mild headache at the time that improved a little with tylenol earlier in the day, but still present. Disappointed that she needed to remain in the hospital. Re-assessed patient during rounds and patient appeared more ill, stating headache worsened, continued right-sided and epigastric abdominal pain, unsure why she felt so bad. Sob felt a little worse. Patient was tearful during interview. Said headache was worst symptom at this time: felt like a pulling sensation over bilateral face without associated visual changes.     Medications  Scheduled Meds:    aspirin chewable tablet 81 mg 81 mg Oral QDAY   dextran 70/hypromellose (NATURAL BALANCE TEARS) 0.1/0.3 % ophthalmic solution 1 drop 1 drop Both Eyes QID   folic acid (FOLVITE) tablet 1 mg 1 mg Oral QDAY   heparin (porcine) PF syringe 5,000 Units 5,000 Units Subcutaneous Q8H   insulin aspart U-100 (NOVOLOG FLEXPEN) injection PEN 0-7 Units 0-7 Units Subcutaneous ACHS   insulin NPH (HUMULIN N KwikPen) injection PEN 14 Units 14 Units Subcutaneous QDAY(07)   levothyroxine (SYNTHROID) tablet 175 mcg 175 mcg Oral QDAY(07)   magnesium oxide (MAG-OX) tablet 400 mg 400 mg Oral BID   melatonin tablet 3 mg 3 mg Oral QHS  metoprolol tartrate (LOPRESSOR) tablet 50 mg 50 mg Oral BID   pantoprazole DR (PROTONIX) tablet 40 mg 40 mg Oral BID(11-21)   potassium chloride SR (K-DUR) tablet 20 mEq 20 mEq Oral QDAY(12)   senna/docusate (SENOKOT-S) tablet 1 tablet 1 tablet Oral BID   tiZANidine (ZANAFLEX) tablet 4 mg 4 mg Oral QHS   vitamins, multiple tablet 1 tablet 1 tablet Oral QDAY   Continuous Infusions:  PRN and Respiratory Meds:acetaminophen Q6H PRN, albuterol 0.5% Q4H PRN, dextromethorphan/guaiFENesin Q4H PRN, ipratropium bromide Q4H PRN, polyethylene glycol 3350 BID PRN, prochlorperazine Q6H PRN, simethicone Q6H PRN, sodium chloride PRN, traMADol Q6H PRN      Review of Systems:  Constitutional: +tired, malaise  Eyes: negative  Ears, nose, mouth, throat, and face: negative  Respiratory: +SOA  Cardiovascular: +Chest pain  Gastrointestinal: +Abd pain, +loose stool  Genitourinary:negative  Neurologic: Headache  Objective:                          Vital Signs: Last Filed                 Vital Signs: 24 Hour Range   BP: 139/60 (01/01 1200) Temp: 36.4 ???C (97.6 ???F) (01/01 1200)  Pulse: 85 (01/01 1251)  Respirations: 22 PER MINUTE (01/01 1251)  SpO2: 98 % (01/01 1251)  O2 Delivery: Nasal Cannula (01/01 1200) BP: (129-139)/(45-60)   Temp:  [36.4 ???C (97.6 ???F)-37.1 ???C (98.8 ???F)]   Pulse:  [59-108]   Respirations:  [18 PER MINUTE-22 PER MINUTE]   SpO2:  [95 %-98 %]   O2 Delivery: Nasal Cannula   Intensity Pain Scale (Self Report): 8 (06/03/17 0418) Vitals:    06/02/17 1543 06/02/17 1645 06/03/17 0500   Weight: 85.8 kg (189 lb 2.5 oz) 115.5 kg (254 lb 10.1 oz) 122.1 kg (269 lb 2.9 oz)       Intake/Output Summary:  (Last 24 hours)    Intake/Output Summary (Last 24 hours) at 06/03/2017 1339  Last data filed at 06/03/2017 1319  Gross per 24 hour   Intake 250 ml   Output 1300 ml   Net -1050 ml      Stool Occurrence: 1    Physical Exam  General Appearance: Appears overweight  well nourished, stated age and in moderate distress.   PSYCH: Normal behavior, goal directed thinking, normal speech (rate and volume)  HEENT: Normocephalic and atraumatic, mucous membranes moist. Sclera anicteric bilaterally.   CV: S1 S2 RRR. No murmurs, rubs, clicks, gallops or S4. Distal extremities warm without cyanosis or clubbing. Radial pulses 2+ bilaterally and symmetric.Chest wall was tender to palpation in subxiphoid and epigastric region.   PULM: No wheezes, rhonchi, increased effort of breathing, or diminished lung sounds in anterior lung bases. Rales auscultated in lower lung fields bilaterally  ABD: Soft, obese, TTP in RUQ and Epigastric.   EXT: 2+ pitting edema to BLE and 1+ to BUE (L>R) with wrinkling noted. No obvious deformity to BLE other than previously noted saphenous vein scarring.   SKIN: Warm and dry to touch. Pallor noted.  NEURO: Tone grossly normal, cranial nerves 2-12 grossly intact, tracks to voice. Alert and interactive.     Lab Review  24-hour labs:    Results for orders placed or performed during the hospital encounter of 05/20/17 (from the past 24 hour(s)) POC GLUCOSE    Collection Time: 06/02/17  2:08 PM   Result Value Ref Range    Glucose, POC 119 (H) 70 - 100  MG/DL   POC GLUCOSE    Collection Time: 06/02/17  5:50 PM   Result Value Ref Range    Glucose, POC 169 (H) 70 - 100 MG/DL   POC GLUCOSE    Collection Time: 06/02/17  8:11 PM   Result Value Ref Range    Glucose, POC 223 (H) 70 - 100 MG/DL   POC GLUCOSE    Collection Time: 06/02/17  9:42 PM   Result Value Ref Range    Glucose, POC 181 (H) 70 - 100 MG/DL   POC GLUCOSE    Collection Time: 06/03/17  3:31 AM   Result Value Ref Range    Glucose, POC 153 (H) 70 - 100 MG/DL   CBC    Collection Time: 06/03/17  6:05 AM   Result Value Ref Range    White Blood Cells 4.6 4.5 - 11.0 K/UL    RBC 2.92 (L) 4.0 - 5.0 M/UL    Hemoglobin 9.2 (L) 12.0 - 15.0 GM/DL    Hematocrit 47.8 (L) 36 - 45 %    MCV 96.1 80 - 100 FL    MCH 31.5 26 - 34 PG    MCHC 32.8 32.0 - 36.0 G/DL    RDW 29.5 (H) 11 - 15 %    Platelet Count 103 (L) 150 - 400 K/UL    MPV 9.2 7 - 11 FL   BASIC METABOLIC PANEL    Collection Time: 06/03/17  6:05 AM   Result Value Ref Range    Sodium 138 137 - 147 MMOL/L    Potassium 4.4 3.5 - 5.1 MMOL/L    Chloride 95 (L) 98 - 110 MMOL/L    CO2 37 (H) 21 - 30 MMOL/L    Anion Gap 6 3 - 12    Glucose 132 (H) 70 - 100 MG/DL    Blood Urea Nitrogen 20 7 - 25 MG/DL    Creatinine 6.21 (H) 0.4 - 1.00 MG/DL    Calcium 9.7 8.5 - 30.8 MG/DL    eGFR Non African American 46 (L) >60 mL/min    eGFR African American 56 (L) >60 mL/min   MAGNESIUM    Collection Time: 06/03/17  6:05 AM   Result Value Ref Range    Magnesium 1.9 1.6 - 2.6 mg/dL   POC GLUCOSE    Collection Time: 06/03/17  8:25 AM   Result Value Ref Range    Glucose, POC 137 (H) 70 - 100 MG/DL   POC GLUCOSE    Collection Time: 06/03/17 11:59 AM   Result Value Ref Range    Glucose, POC 129 (H) 70 - 100 MG/DL       Point of Care Testing  (Last 24 hours)  Glucose: (!) 132 (06/03/17 0605)  POC Glucose (Download): (!) 129 (06/03/17 1159)    Radiology and other Diagnostics Review: Pertinent radiology reviewed.    Rosezena Sensor, MD   Pager [phone]

## 2017-06-03 NOTE — Progress Notes
Heart Failure Nursing Progress Note    Admission Date: 05/20/2017  LOS: 14 days    Admission Weight: 132 kg (291 lb 0.1 oz)        Most recent weights (inpatient):   Vitals:    06/02/17 1543 06/02/17 1645 06/03/17 0500   Weight: 85.8 kg (189 lb 2.5 oz) 115.5 kg (254 lb 10.1 oz) 122.1 kg (269 lb 2.9 oz)     Weight change from previous day: +7kg    Fluid restriction ordered: 1.5L     Intake/Output Summary: (Last 24 hours)    Intake/Output Summary (Last 24 hours) at 06/03/2017 0552  Last data filed at 06/03/2017 0420  Gross per 24 hour   Intake 370 ml   Output 1400 ml   Net -1030 ml     Anticipated discharge date: 06/04/17  Discharge goals: decrease swelling      Daily Assessment of Patient Stated Goals:    Short Term Goal Identified by patient (Short Term=during hospitalization):  Move better

## 2017-06-04 LAB — MAGNESIUM: Lab: 2 mg/dL — ABNORMAL LOW (ref 1.6–2.6)

## 2017-06-04 LAB — POC GLUCOSE
Lab: 105 mg/dL — ABNORMAL HIGH (ref 70–100)
Lab: 110 mg/dL — ABNORMAL HIGH (ref 70–100)
Lab: 122 mg/dL — ABNORMAL HIGH (ref 70–100)
Lab: 131 mg/dL — ABNORMAL HIGH (ref 70–100)
Lab: 186 mg/dL — ABNORMAL HIGH (ref 70–100)

## 2017-06-04 LAB — BASIC METABOLIC PANEL: Lab: 139 MMOL/L — ABNORMAL LOW (ref >60)

## 2017-06-04 LAB — CBC: Lab: 2.9 K/UL — ABNORMAL LOW (ref 4.5–11.0)

## 2017-06-04 MED ORDER — FUROSEMIDE 80 MG PO TAB
80 mg | Freq: Two times a day (BID) | ORAL | 0 refills | Status: DC
Start: 2017-06-04 — End: 2017-06-06
  Administered 2017-06-04 – 2017-06-06 (×4): 80 mg via ORAL

## 2017-06-04 NOTE — Progress Notes
PHYSICAL THERAPY  PROGRESS NOTE       MOBILITY:  Mobility  Progressive Mobility Level: Stand  Level of Assistance: Assist X1  Time Tolerated: 11-30 minutes  Activity Limited By: Fatigue;Lethargy    SUBJECTIVE:  Subjective  Significant hospital events: Reason for admission: respiratory failure - medically complex patient PMH: CAD, GI bleed, CABG, heart failure, afib, anemia, HTN, DM, CKD, hypothyroidism, morbid obesity, liver disease, HTN, congestive colopathy, ascites  Mental / Cognitive Status: Alert;Cooperative  Persons Present: Spouse  Pain: Patient has no complaint of pain  Pain Interventions: Patient agrees to participate in therapy  Comments: Pt was in bed and voiced concern over the increase in hand edema.  She stated she slept well overnight and still feels very tired.  Pt reported an improvement in her abdominal pain.  Comments: 2 liters oxygen  Ambulation Assist: Independent Mobility at Household Level with Device  Patient Owned Equipment: Nurse, adult  Home Situation: Lives with Family  Type of Home: House  Entry Stairs: Ramp  In-Home Stairs: No Stairs    BED MOBILITY/TRANSFERS:  Bed Mobility/Transfers  Bed Mobility: Supine to Sit: Minimal Assist;Head of Bed Elevated;Use of Rail;Assist with Trunk;Requires Extra Time(assist to scoot hips forward)  Bed Mobility: Sit to Supine: Minimal Assist;Verbal Cues;Bed Flat;Assist with B LE  Transfer Type: Sit to/from Stand  Transfer: Assistance Level: To/From;Bed;Minimal Assist  Transfer: Assistive Device: (hands on scale)  Transfers: Type Of Assistance: Verbal Cues;For Strength Deficit;For Safety Considerations  End Of Activity Status: In Bed;Instructed Patient to Request Assist with Mobility;Instructed Patient to Use Call Light    ACTIVITY/EXERCISE:  Activity / Exercise  Sit Edge Of Bed: 15 minutes  Sit Edge Of Bed Assist: Stand By Assist  Activity Limited By: Complaint of Fatigue;Patient Choice(dizziness improved with prolonged time sitting) Comments: Pt was able to step onto/off the scale with hands on for safety.  She was then able to side step to the [facility].    ASSESSMENT/PROGRESS:  Assessment/Progress  Comments: Pt is limited by decreased activity tolerance/decreased endurance.  Anticipate that pt has adequate strength/balance for short distance ambulation.  AM-PAC 6 Clicks Basic Mobility Inpatient  Turning from your back to your side while in a flat bed without using bed rails: A Little  Moving from lying on your back to sitting on the side of a flatbed without using bedrails : A Lot  Moving to and from a bed to a chair (including a wheelchair): A Little  Standing up from a chair using your arms (e.g. wheelchair, or bedside chair): A Little  To walk in hospital room: A Lot  Climbing 3-5 steps with a railing: Total  Raw Score: 14  Standardized (T-scale) Score: 35.55  Basic Mobility CMS 0-100%: 53.86  CMS G Code Modifier for Basic Mobility: CK    GOALS:  Goals  Goal Formulation: With Patient  Time For Goal Achievement: 5 days  Pt Will Go Supine To/From Sit: w/ Stand By Assist  Pt Will Transfer Bed/Chair: w/ Stand By Assist  Pt Will Transfer Sit to Stand: w/ Stand By Assist  Pt Will Ambulate: 1-10 Feet, w/ Dan Humphreys, w/ Minimal Assist    PLAN:  Plan   Treatment Interventions: Mobility Training;Strengthening;Balance Activities  Plan Frequency: 5 Days per Week  PT Plan for Next Visit: *bed mobility to simulate home  *side stepping vs short distance ambulation with chair follow    RECOMMENDATIONS:  PT Discharge Recommendations  PT Discharge Recommendations: Inpatient Setting    Therapist: Norva Karvonen, Physical therapist assistant  Date: 06/04/2017

## 2017-06-04 NOTE — Progress Notes
OCCUPATIONAL THERAPY  PROGRESS NOTE    Patient Name: Diane Savage                   Room/Bed: CZ6606/30  Admitting Diagnosis:  Resp failure      Mobility  Progressive Mobility Level: Active transfer to chair  Level of Assistance: Assist X1  Assistive Device: Walker  Time Tolerated: 11-30 minutes    Subjective  Pertinent Dx per Physician:  PMH CAD s/p CABG 02/2017, HF, afib s/p L atrial appendage ligation not on AC d/t recurrent GIBs c/b transfusion-dependent anemia, liver disease c/b portal HTN, congestive colopathy, EV, GAVE & ascites. Admitted as transfer 12/18 after presenting there on 12/16 for syncope/fatigue. Admit c/b AMS/respiratory failure requiring intubation, afib w/ RVR & septic shock from HCAP + UTI. Extubated 12/19.  Precautions: Falls;O2 Requirement  Comments: 2L O2 NC    Objective  Psychosocial Status: Willing and Cooperative to Participate  Persons Present: Spouse    Home Living  Type of Home: House  Home Layout: One Level;Ramped Environmental education officer / Tub: Pensions consultant: Environmental consultant    Prior Function  Level Of Independence: Independent with ADLs and functional transfers  Lives With: Spouse  Receives Help From: None Needed  Other Function Comments: Pt reports 2 recent falls in the bathroom, falling while trying to transfer off the toilet.    ADL's  Where Assessed: Edge of Bed  Grooming Assist: Stand By Assist  Grooming Deficits: Setup;Denture Care;Brushing Hair  Toileting Assist: Moderate Assist  Toileting Deficits: Perineal Hygiene;Bedside Commode  Functional Transfer Assist: Minimal Assist  Functional Transfer Deficits: Commode Transfer  Comment: Patient sits edge of bed x15 minutes to complete grooming/hygiene task and rest following toileting. Sp02 remains in the 90's throughout session.     Activity Tolerance  Endurance: 3/5 Tolerates 25-30 Minutes Exercise w/Multiple Rests  Sitting Balance: 2+/5 Supports Self w/ 1 UE Provided level 2 resistive band for UE strengthening and general endurance building. Patient demonstrates simple use. Educated on appropriate use i.e. extending arms versus short stretches in order to achieve quality movements.     Cognition  Overall Cognitive Status: WFL to Adequately Complete Self Care Tasks Safely    AM-PAC 6 Clicks Daily Activity Inpatient  Putting on and taking off regular lower body clothes?: Total  Bathing (Including washing, rinsing, drying): A Lot  Toileting, which includes using toilet, bedpan, or urinal: Total  Putting on and taking off regular upper body clothing: A Lot  Taking care of personal grooming such as brushing teeth: None  Eating meals?: None  Daily Activity Raw Score: 14  Standardized (t-scale) score: 33.39  CMS 0-100% Score: 59.67  CMS G Code Modifier: CK    Plan  OT Frequency: 5x/week  OT Plan for Next Visit: standing ADLs at the sink      ADL Goals  Patient Will Perform Grooming: at [facility] of Bed;in Chair;w/ Stand By Assist  Patient Will Perform LE Dressing: w/ Minimum Assist  Patient Will Perform Toileting: w/ Minimum Assist    Functional Transfer Goals  Pt Will Perform All Functional Transfers: Minimum Assist         OT Discharge Recommendations  OT Discharge Recommendations: Inpatient Setting, To address deficits, maximize function and improve safety.    Recommend ongoing assistance for: In and out of house, Transfers, Bed mobility, Ambulation, Stairs        Therapist: Theotis Burrow, [place] [id number]  Date: 06/04/2017

## 2017-06-04 NOTE — Progress Notes
Heart Failure Nursing Progress Note    Admission Date: 05/20/2017  LOS: 15 days    Admission Weight: 132 kg (291 lb 0.1 oz)        Most recent weights (inpatient):   Vitals:    06/02/17 1645 06/03/17 0500 06/04/17 0530   Weight: 115.5 kg (254 lb 10.1 oz) 122.1 kg (269 lb 2.9 oz) 126.3 kg (278 lb 7.1 oz)     Weight change from previous day: Inaccurate Weights; unable to assess weight gain or loss    Fluid restriction ordered: Yes; 1.5L FR    Intake/Output Summary: (Last 24 hours)    Intake/Output Summary (Last 24 hours) at 06/04/2017 0732  Last data filed at 06/04/2017 0345  Gross per 24 hour   Intake 440 ml   Output 725 ml   Net -285 ml       Is patient incontinent No    Anticipated discharge date: TBD  Discharge goals: Decrease SOA    Daily Assessment of Patient Stated Goals:    Short Term Goal Identified by patient (Short Term=during hospitalization):  Get stronger

## 2017-06-04 NOTE — Progress Notes
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 pt aware of risk of aspiration  - SLP recs 12/22: Continue regular solids, thin liquids    ???  #DM2  -???PTA regimen:???NPH 14 units Q AM, novolog 4 units TID  - BS 110-160s last 24 hrs  PLAN  - continue 14U NPH daily + low dose correction factor    #Hypothyroidism  -???TSH???1.99 (12/18)  PLAN  -???Continue???PTA???synthroid daily    ???  FEN  - Regular solids, thin liquids, swallow precautions, 1.5L restriction  - tube feedings: dc'd  - IVF: none  - review electrolytes and replace as needed  ???  DVT PPx: SCDs; Heparin  Code Status: Full Code  Disposition/Family: Continue admission to med 2, pending rehab acceptance    Patient seen and discussed with Dr. Smith Mince  ???  Bridget Hartshorn  PGY-1  Pager [phone]    ________________________________________________________________________    Diane Savage is a 62 y.o. female. Today, patient much improved from yesterday. No events overnight. Patient states that she is unsure whether or not migraine cocktail helped. However, states that she has had the best night of sleep she has had since admission last night. Feels like hands and feet are more swollen. Denies headache, chest pain, abdominal pain or cramping today. BMs improved, 1x, more formed. Only getting up for stools    Medications  Scheduled Meds:    aspirin chewable tablet 81 mg 81 mg Oral QDAY   dextran 70/hypromellose (NATURAL BALANCE TEARS) 0.1/0.3 % ophthalmic solution 1 drop 1 drop Both Eyes QID   folic acid (FOLVITE) tablet 1 mg 1 mg Oral QDAY   furosemide (LASIX) tablet 80 mg 80 mg Oral BID(9-17)   heparin (porcine) PF syringe 5,000 Units 5,000 Units Subcutaneous Q8H   insulin aspart U-100 (NOVOLOG FLEXPEN) injection PEN 0-7 Units 0-7 Units Subcutaneous ACHS   insulin NPH (HUMULIN N KwikPen) injection PEN 14 Units 14 Units Subcutaneous QDAY(07)   levothyroxine (SYNTHROID) tablet 175 mcg 175 mcg Oral QDAY(07)   magnesium oxide (MAG-OX) tablet 400 mg 400 mg Oral BID   melatonin tablet 3 mg 3 mg Oral QHS   metoprolol tartrate (LOPRESSOR) tablet 50 mg 50 mg Oral BID   pantoprazole DR (PROTONIX) tablet 40 mg 40 mg Oral BID(11-21)   potassium chloride SR (K-DUR) tablet 20 mEq 20 mEq Oral QDAY(12)   senna/docusate (SENOKOT-S) tablet 1 tablet 1 tablet Oral BID   tiZANidine (ZANAFLEX) tablet 4 mg 4 mg Oral QHS   vitamins, multiple tablet 1 tablet 1 tablet Oral QDAY   Continuous Infusions:  PRN and Respiratory Meds:acetaminophen Q6H PRN, albuterol 0.5% Q4H PRN, dextromethorphan/guaiFENesin Q4H PRN, ipratropium bromide Q4H PRN, polyethylene glycol 3350 BID PRN, prochlorperazine Q6H PRN, simethicone Q6H PRN, sodium chloride PRN, traMADol Q6H PRN      Review of Systems:  Constitutional: Negative  Eyes: negative  Ears, nose, mouth, throat, and face: negative  Respiratory: -SOA  Cardiovascular: -Chest pain  Gastrointestinal: -Abd pain, +loose stool  Genitourinary:negative  Neurologic: Headache  Objective: Vital Signs: Last Filed                 Vital Signs: 24 Hour Range   BP: 122/52 (01/02 1535)  Temp: 36.4 ???C (97.6 ???F) (01/02 1535)  Pulse: 83 (01/02 1535)  Respirations: 20 PER MINUTE (01/02 1535)  SpO2: 98 % (01/02 1535)  O2 Delivery: Nasal Cannula (01/02 1535) BP: (90-149)/(36-64)   Temp:  [36.4 ???C (97.6 ???F)-37.2 ???C (98.9 ???F)]   Pulse:  [81-131]   Respirations:  [16 PER MINUTE-22 PER MINUTE]   SpO2:  [93 %-98 %]   O2 Delivery: Nasal Cannula   Intensity Pain Scale (Self Report): Asleep (06/04/17 1119) Vitals:    06/02/17 1645 06/03/17 0500 06/04/17 0530   Weight: 115.5 kg (254 lb 10.1 oz) 122.1 kg (269 lb 2.9 oz) 126.3 kg (278 lb 7.1 oz)       Intake/Output Summary:  (Last 24 hours)    Intake/Output  Summary (Last 24 hours) at 06/04/2017 1640  Last data filed at 06/04/2017 1500  Gross per 24 hour   Intake 662 ml   Output 600 ml   Net 62 ml      Stool Occurrence: 1    Physical Exam  General Appearance: Appears overweight  well nourished, stated age and in no acute distress.   PSYCH: Normal behavior, goal directed thinking, normal speech (rate and volume)  HEENT: Normocephalic and atraumatic, mucous membranes moist. Sclera anicteric bilaterally.   CV: S1 S2 RRR. No murmurs, rubs, clicks, gallops or S4. Distal extremities warm without cyanosis or clubbing. Radial pulses 2+ bilaterally and symmetric.Chest wall was tender to palpation in subxiphoid and epigastric region.   PULM: No wheezes, rhonchi, increased effort of breathing, or diminished lung sounds in anterior lung bases. Rales auscultated in lower lung fields bilaterally  ABD: Soft, obese, TTP in RUQ and Epigastric.   EXT: 2+ pitting edema to BLE and 1+ to BUE (L>R) with wrinkling noted. No obvious deformity to BLE other than previously noted saphenous vein scarring.   SKIN: Warm and dry to touch. Pallor noted.  NEURO: Tone grossly normal, cranial nerves 2-12 grossly intact, tracks to voice. Alert and interactive.     Lab Review  24-hour labs: Results for orders placed or performed during the hospital encounter of 05/20/17 (from the past 24 hour(s))   POC GLUCOSE    Collection Time: 06/03/17  6:03 PM   Result Value Ref Range    Glucose, POC 131 (H) 70 - 100 MG/DL   POC GLUCOSE    Collection Time: 06/03/17  8:28 PM   Result Value Ref Range    Glucose, POC 186 (H) 70 - 100 MG/DL   CBC    Collection Time: 06/04/17  6:39 AM   Result Value Ref Range    White Blood Cells 2.9 (L) 4.5 - 11.0 K/UL    RBC 2.53 (L) 4.0 - 5.0 M/UL    Hemoglobin 8.0 (L) 12.0 - 15.0 GM/DL    Hematocrit 16.1 (L) 36 - 45 %    MCV 96.0 80 - 100 FL    MCH 31.5 26 - 34 PG    MCHC 32.8 32.0 - 36.0 G/DL    RDW 09.6 (H) 11 - 15 %    Platelet Count 87 (L) 150 - 400 K/UL    MPV 8.6 7 - 11 FL   BASIC METABOLIC PANEL    Collection Time: 06/04/17  6:39 AM   Result Value Ref Range    Sodium 139 137 - 147 MMOL/L    Potassium 4.1 3.5 - 5.1 MMOL/L    Chloride 97 (L) 98 - 110 MMOL/L    CO2 37 (H) 21 - 30 MMOL/L    Anion Gap 5 3 - 12    Glucose 119 (H) 70 - 100 MG/DL    Blood Urea Nitrogen 20 7 - 25 MG/DL    Creatinine 0.45 (H) 0.4 - 1.00 MG/DL    Calcium 9.0 8.5 - 40.9 MG/DL    eGFR Non African American 49 (L) >60 mL/min    eGFR African American 59 (L) >60 mL/min   MAGNESIUM    Collection Time: 06/04/17  6:39 AM   Result Value Ref Range    Magnesium 2.0 1.6 - 2.6 mg/dL   POC GLUCOSE    Collection Time: 06/04/17  8:55 AM   Result Value Ref Range    Glucose, POC 110 (H) 70 - 100 MG/DL  POC GLUCOSE    Collection Time: 06/04/17  2:46 PM   Result Value Ref Range    Glucose, POC 105 (H) 70 - 100 MG/DL       Point of Care Testing  (Last 24 hours)  Glucose: (!) 119 (06/04/17 0639)  POC Glucose (Download): (!) 105 (06/04/17 1446)    Radiology and other Diagnostics Review:    Pertinent radiology reviewed.    Rosezena Sensor, MD   Pager [phone]

## 2017-06-05 LAB — CBC
Lab: 2.9 10*3/uL — ABNORMAL LOW (ref 4.5–11.0)
Lab: 114 10*3/uL — ABNORMAL LOW (ref 150–400)
Lab: 16 % — ABNORMAL HIGH (ref 11–15)
Lab: 2.7 M/UL — ABNORMAL LOW (ref 4.0–5.0)
Lab: 26 % — ABNORMAL LOW (ref 36–45)
Lab: 3.7 10*3/uL — ABNORMAL LOW (ref 4.5–11.0)
Lab: 31 pg (ref 26–34)
Lab: 32 g/dL — ABNORMAL HIGH (ref 32.0–36.0)
Lab: 8.5 g/dL — ABNORMAL LOW (ref 12.0–15.0)
Lab: 8.6 FL — ABNORMAL LOW (ref >60)
Lab: 96 FL (ref 80–100)

## 2017-06-05 LAB — MAGNESIUM: Lab: 1.9 mg/dL — ABNORMAL LOW (ref >60)

## 2017-06-05 LAB — POC GLUCOSE
Lab: 151 mg/dL — ABNORMAL HIGH (ref 70–100)
Lab: 161 mg/dL — ABNORMAL HIGH (ref 70–100)
Lab: 112 mg/dL — ABNORMAL HIGH (ref 70–100)
Lab: 101 mg/dL — ABNORMAL HIGH (ref 70–100)

## 2017-06-05 LAB — BASIC METABOLIC PANEL: Lab: 139 MMOL/L — ABNORMAL LOW (ref 137–147)

## 2017-06-05 MED ORDER — MAGNESIUM SULFATE IN D5W 1 GRAM/100 ML IV PGBK
1 g | INTRAVENOUS | 0 refills | Status: CP
Start: 2017-06-05 — End: ?
  Administered 2017-06-05 (×2): 1 g via INTRAVENOUS

## 2017-06-05 NOTE — Progress Notes
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 Plan to discharge to rehab tomorrow morning.    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 pt aware of risk of aspiration  - SLP recs 12/22: Continue regular solids, thin liquids    #DM2  -???PTA regimen:???NPH 14 units Q AM, novolog 4 units TID  - BS 110-160s last 24 hrs  PLAN  - continue 14U NPH daily + low dose correction factor    #Hypothyroidism  -???TSH???1.99 (12/18)  PLAN  -???Continue???PTA???synthroid daily    ???  FEN  - Cardiac/diabetic diet, 1.5  L restriction  - IVF: none  - review electrolytes and replace as needed  DVT PPx: SCDs; SQ heparin  Code Status: Full Code  Disposition/Family: Continue admission to Med 2, discharge to rehab tomorrow morning    Patient seen and discussed with Dr. Smith Mince  ???  Leonia Reeves MD  Internal Medicine PGY-2  Pager: [phone]  ________________________________________________________________________    Diane Savage is a 62 y.o. female. No acute overnight events. Patient doing well today. She is happy about seeing wrinkles on her feet. Her BM's have returned to normal occurences and form. She reports her breathing feels much better today. She denies any further headaches.    Medications  Scheduled Meds:    aspirin chewable tablet 81 mg 81 mg Oral QDAY   dextran 70/hypromellose (NATURAL BALANCE TEARS) 0.1/0.3 % ophthalmic solution 1 drop 1 drop Both Eyes QID   folic acid (FOLVITE) tablet 1 mg 1 mg Oral QDAY   furosemide (LASIX) tablet 80 mg 80 mg Oral BID(9-17)   heparin (porcine) PF syringe 5,000 Units 5,000 Units Subcutaneous Q8H   insulin aspart U-100 (NOVOLOG FLEXPEN) injection PEN 0-7 Units 0-7 Units Subcutaneous ACHS   insulin NPH (HUMULIN N KwikPen) injection PEN 14 Units 14 Units Subcutaneous QDAY(07)   levothyroxine (SYNTHROID) tablet 175 mcg 175 mcg Oral QDAY(07)   magnesium oxide (MAG-OX) tablet 400 mg 400 mg Oral BID   melatonin tablet 3 mg 3 mg Oral QHS   metoprolol tartrate (LOPRESSOR) tablet 50 mg 50 mg Oral BID   pantoprazole DR (PROTONIX) tablet 40 mg 40 mg Oral BID(11-21)   potassium chloride SR (K-DUR) tablet 20 mEq 20 mEq Oral QDAY(12)   senna/docusate (SENOKOT-S) tablet 1 tablet 1 tablet Oral BID   tiZANidine (ZANAFLEX) tablet 4 mg 4 mg Oral QHS   vitamins, multiple tablet 1 tablet 1 tablet Oral QDAY   Continuous Infusions:  PRN and Respiratory Meds:acetaminophen Q6H PRN, albuterol 0.5% Q4H PRN, dextromethorphan/guaiFENesin Q4H PRN, ipratropium bromide Q4H PRN, polyethylene glycol 3350 BID PRN, prochlorperazine Q6H PRN, simethicone Q6H PRN, sodium chloride PRN, traMADol Q6H PRN      Review of Systems:  Constitutional: Negative  Eyes: negative  Ears, nose, mouth, throat, and face: negative  Respiratory: -SOA  Cardiovascular: -Chest pain  Gastrointestinal: -Abd pain  Genitourinary:negative  Neurologic: negative  Objective:                          Vital Signs: Last Filed                 Vital Signs: 24 Hour Range   BP: 123/49 (01/03 1537)  Temp: 37.1 ???C (98.8 ???F) (01/03 1537)  Pulse: 106 (01/03 1537)  Respirations: 20 PER MINUTE (01/03 1537)  SpO2: 97 % (01/03 1537) O2 Delivery: Nasal Cannula (01/03 1537) BP: (102-123)/(47-71)   Temp:  [36.4 ???C (97.6 ???F)-37.1 ???C (98.8 ???F)]   Pulse:  [78-106]   Respirations:  [16 PER MINUTE-20 PER MINUTE]   SpO2:  [96 %-98 %]   O2 Delivery: Nasal Cannula   Intensity Pain Scale (Self Report): 3 (06/05/17 0845) Vitals:    06/04/17 0530 06/05/17 0450 06/05/17 0947   Weight: 126.3 kg (278 lb 7.1 oz) 122.9 kg (271 lb) 118.6 kg (261 lb 8 oz)       Intake/Output Summary:  (Last 24 hours)    Intake/Output Summary (Last 24 hours) at 06/05/2017 1551  Last data filed at 06/05/2017 1537  Gross per 24 hour  Intake 684 ml   Output 1450 ml   Net -766 ml      Stool Occurrence: 0    Physical Exam  General Appearance: Appears overweight  well nourished, stated age and in no acute distress.   PSYCH: Normal behavior, goal directed thinking, normal speech (rate and volume)  HEENT: Normocephalic and atraumatic, mucous membranes moist. Sclera anicteric bilaterally.   CV: S1 S2 RRR. No murmurs, rubs, clicks, gallops or S4. Distal extremities warm without cyanosis or clubbing. Radial pulses 2+ bilaterally and symmetric.Chest wall was tender to palpation in subxiphoid and epigastric region.   PULM: No wheezes, rhonchi, increased effort of breathing, or diminished lung sounds in anterior lung bases. Rales auscultated in lower lung fields bilaterally  ABD: Soft, obese, mild TTP in epigastrium.   EXT: 2+ pitting edema to BLE and 1+ to BUE (L>R) with wrinkling noted. No obvious deformity to BLE other than previously noted saphenous vein scarring.   SKIN: Warm and dry to touch. Pallor noted.  NEURO: Tone grossly normal, cranial nerves 2-12 grossly intact, tracks to voice. Alert and interactive.     Lab Review  24-hour labs:    Results for orders placed or performed during the hospital encounter of 05/20/17 (from the past 24 hour(s))   POC GLUCOSE    Collection Time: 06/04/17  5:44 PM   Result Value Ref Range    Glucose, POC 122 (H) 70 - 100 MG/DL   POC GLUCOSE Collection Time: 06/04/17 10:17 PM   Result Value Ref Range    Glucose, POC 151 (H) 70 - 100 MG/DL   POC GLUCOSE    Collection Time: 06/05/17  3:19 AM   Result Value Ref Range    Glucose, POC 161 (H) 70 - 100 MG/DL   CBC    Collection Time: 06/05/17  5:48 AM   Result Value Ref Range    White Blood Cells 2.9 (L) 4.5 - 11.0 K/UL    RBC 2.73 (L) 4.0 - 5.0 M/UL    Hemoglobin 8.5 (L) 12.0 - 15.0 GM/DL    Hematocrit 16.1 (L) 36 - 45 %    MCV 96.3 80 - 100 FL    MCH 31.3 26 - 34 PG    MCHC 32.5 32.0 - 36.0 G/DL    RDW 09.6 (H) 11 - 15 %    Platelet Count 89 (L) 150 - 400 K/UL    MPV 8.4 7 - 11 FL   BASIC METABOLIC PANEL    Collection Time: 06/05/17  5:48 AM   Result Value Ref Range    Sodium 139 137 - 147 MMOL/L    Potassium 4.1 3.5 - 5.1 MMOL/L    Chloride 97 (L) 98 - 110 MMOL/L    CO2 37 (H) 21 - 30 MMOL/L    Anion Gap 5 3 - 12    Glucose 136 (H) 70 - 100 MG/DL    Blood Urea Nitrogen 19 7 - 25 MG/DL    Creatinine 0.45 (H) 0.4 - 1.00 MG/DL    Calcium 8.8 8.5 - 40.9 MG/DL    eGFR Non African American 48 (L) >60 mL/min    eGFR African American 58 (L) >60 mL/min   MAGNESIUM    Collection Time: 06/05/17  5:48 AM   Result Value Ref Range    Magnesium 1.9 1.6 - 2.6 mg/dL   POC GLUCOSE    Collection Time: 06/05/17  7:39 AM   Result Value Ref Range    Glucose, POC 112 (  H) 70 - 100 MG/DL   CBC    Collection Time: 06/05/17 10:03 AM   Result Value Ref Range    White Blood Cells 3.7 (L) 4.5 - 11.0 K/UL    RBC 2.75 (L) 4.0 - 5.0 M/UL    Hemoglobin 8.5 (L) 12.0 - 15.0 GM/DL    Hematocrit 16.1 (L) 36 - 45 %    MCV 96.8 80 - 100 FL    MCH 31.0 26 - 34 PG    MCHC 32.1 32.0 - 36.0 G/DL    RDW 09.6 (H) 11 - 15 %    Platelet Count 114 (L) 150 - 400 K/UL    MPV 8.6 7 - 11 FL   POC GLUCOSE    Collection Time: 06/05/17  1:08 PM   Result Value Ref Range    Glucose, POC 101 (H) 70 - 100 MG/DL       Point of Care Testing  (Last 24 hours)  Glucose: (!) 136 (06/05/17 0548)  POC Glucose (Download): (!) 101 (06/05/17 1308) Radiology and other Diagnostics Review:    Pertinent radiology reviewed.    Raj Janus, MD   Pager [phone]

## 2017-06-05 NOTE — Progress Notes
PHYSICAL THERAPY  PROGRESS NOTE       MOBILITY:  Mobility  Progressive Mobility Level: Walk in hallway  Distance Walked (feet): 50 ft(x 2)  Level of Assistance: Assist X2  Assistive Device: Walker  Time Tolerated: 11-30 minutes  Activity Limited By: Weakness    SUBJECTIVE:  Subjective  Significant hospital events: Reason for admission: respiratory failure - medically complex patient PMH: CAD, GI bleed, CABG, heart failure, afib, anemia, HTN, DM, CKD, hypothyroidism, morbid obesity, liver disease, HTN, congestive colopathy, ascites  Mental / Cognitive Status: Alert;Oriented;Cooperative  Persons Present: RehabTechnician;Spouse  Pain: Patient complains of pain;Patient does not rate pain  Pain Location: Left;Wrist(IV site)  Pain Interventions: Patient agrees to participate in therapy with modifications to session(pt using ice for pain control)  Comments: Pt was in bed and agreed to walk.  She said she is hoping to go to rehab today.  Comments: 2 liters oxygen  Ambulation Assist: Independent Mobility at Household Level with Device  Patient Owned Equipment: Nurse, adult  Home Situation: Lives with Family  Type of Home: House  Entry Stairs: Ramp  In-Home Stairs: No Stairs    BED MOBILITY/TRANSFERS:  Bed Mobility/Transfers  Bed Mobility: Supine to Sit: Minimal Assist;Head of Bed Elevated;Use of Rail;Assist with Trunk;Requires Extra Time  Bed Mobility: Sit to Supine: Minimal Assist;Verbal Cues;Bed Flat;Assist with B LE  Transfer Type: Sit to/from Stand  Transfer: Assistance Level: To/From;Bed;Bed Side Chair;Minimal Assist  Transfer: Assistive Device: Roller Walker(hand held going to the scale)  Transfers: Type Of Assistance: Verbal Cues;For Safety Considerations  End Of Activity Status: In Bed;Instructed Patient to Request Assist with Mobility;Instructed Patient to Use Call Light(bed in chair mode)    GAIT:  Gait  Gait Distance: 50 feet(x 2: seated rest) Gait: Assistance Level: Minimal Assist;of 1st person;Standby Assist;of 2nd person;Management of Lines;Safety Considerations(hands on for safety/pt stated her knees buckle without warning)  Gait: Assistive Device: Nurse, adult;Wheelchair Follow  Gait: Descriptors: Pace: Slow;Decreased step length;No balance loss  Comments: oxygen saturation remained in the mid-upper 90's during activity on 2 liters of oxygen  Activity Limited By: Weakness  Comments: Pt was able to step onto the scale with hands on for safety.  She was able to stand without UE support for approximately 30 seconds while the scale weighed her.    ASSESSMENT/PROGRESS:  Assessment/Progress  Comments: Pt demonstrated an improvement to activity today.  She is limited by decreased endurance and LE weakness.  Pt would benefit from continued therapy in an inpatient setting.  AM-PAC 6 Clicks Basic Mobility Inpatient  Turning from your back to your side while in a flat bed without using bed rails: A Little  Moving from lying on your back to sitting on the side of a flatbed without using bedrails : A Lot  Moving to and from a bed to a chair (including a wheelchair): A Little  Standing up from a chair using your arms (e.g. wheelchair, or bedside chair): A Little  To walk in hospital room: A Little  Climbing 3-5 steps with a railing: Total  Raw Score: 15  Standardized (T-scale) Score: 36.97  Basic Mobility CMS 0-100%: 50.4  CMS G Code Modifier for Basic Mobility: CK    GOALS:  Goals  Goal Formulation: With Patient  Time For Goal Achievement: 5 days  Pt Will Go Supine To/From Sit: w/ Stand By Assist  Pt Will Transfer Bed/Chair: w/ Stand By Assist  Pt Will Transfer Sit to Stand: w/ Stand By Assist  Pt Will Ambulate: 1-10  Feet, w/ Walker, w/ Minimal Assist, Met  *will update ambulation goal if pt does not discharge today    PLAN:  Plan   Treatment Interventions: Mobility Training;Strengthening;Balance Activities  Plan Frequency: 5 Days per Week PT Plan for Next Visit: *progress ambulation distance with chair follow    RECOMMENDATIONS:  PT Discharge Recommendations  PT Discharge Recommendations: Inpatient Setting    Therapist: Norva Karvonen, Physical therapist assistant  Date: 06/05/2017

## 2017-06-05 NOTE — Progress Notes
Heart Failure Nursing Progress Note    Admission Date: 05/20/2017  LOS: 15 days    Admission Weight: 132 kg (291 lb 0.1 oz)        Most recent weights (inpatient):   Vitals:    06/02/17 1645 06/03/17 0500 06/04/17 0530   Weight: 115.5 kg (254 lb 10.1 oz) 122.1 kg (269 lb 2.9 oz) 126.3 kg (278 lb 7.1 oz)     Weight change from previous day: Recent inaccurate weights, unable to assess weight gain/loss at this time.    Fluid restriction ordered: 1.5L    Intake/Output Summary: (Last 24 hours)    Intake/Output Summary (Last 24 hours) at 06/04/2017 1808  Last data filed at 06/04/2017 1500  Gross per 24 hour   Intake 662 ml   Output 600 ml   Net 62 ml       Is patient incontinent No . Pt occasionally mixes urine/stool-unable to measure urine separately.    Anticipated discharge date: TBD  Discharge goals: Decrease shortness of breath on exertion.      Daily Assessment of Patient Stated Goals:    Short Term Goal Identified by patient (Short Term=during hospitalization):  To get stronger

## 2017-06-05 NOTE — Progress Notes
Heart Failure Nursing Progress Note    Admission Date: 05/20/2017  LOS: 16 days    Admission Weight: 132 kg (291 lb 0.1 oz)        Most recent weights (inpatient):   Vitals:    06/03/17 0500 06/04/17 0530 06/05/17 0450   Weight: 122.1 kg (269 lb 2.9 oz) 126.3 kg (278 lb 7.1 oz) 122.9 kg (271 lb)     Weight change from previous day:-3.4kg    Fluid restriction ordered: 1567mL    Intake/Output Summary: (Last 24 hours)    Intake/Output Summary (Last 24 hours) at 06/05/2017 0534  Last data filed at 06/05/2017 0450  Gross per 24 hour   Intake 1146 ml   Output 1400 ml   Net -254 ml       Is patient incontinent No    Anticipated discharge date: TBD  Discharge goals: Decrease SOA on exertion.       Daily Assessment of Patient Stated Goals:    Short Term Goal Identified by patient (Short Term=during hospitalization):  To get stronger.

## 2017-06-06 ENCOUNTER — Encounter: Admit: 2017-05-20 | Discharge: 2017-05-20 | Payer: MEDICARE

## 2017-06-06 ENCOUNTER — Inpatient Hospital Stay
Admit: 2017-05-21 | Discharge: 2017-06-06 | Disposition: A | Discharge status: Rehabilitation Facility | Payer: MEDICARE | Source: Other Acute Inpatient Hospital

## 2017-06-06 ENCOUNTER — Inpatient Hospital Stay: Admit: 2017-05-21 | Discharge: 2017-05-21 | Payer: MEDICARE

## 2017-06-06 DIAGNOSIS — A419 Sepsis, unspecified organism: Principal | ICD-10-CM

## 2017-06-06 DIAGNOSIS — K922 Gastrointestinal hemorrhage, unspecified: ICD-10-CM

## 2017-06-06 DIAGNOSIS — B370 Candidal stomatitis: ICD-10-CM

## 2017-06-06 DIAGNOSIS — Z6841 Body Mass Index (BMI) 40.0 and over, adult: ICD-10-CM

## 2017-06-06 DIAGNOSIS — Z951 Presence of aortocoronary bypass graft: ICD-10-CM

## 2017-06-06 DIAGNOSIS — E039 Hypothyroidism, unspecified: ICD-10-CM

## 2017-06-06 DIAGNOSIS — R6521 Severe sepsis with septic shock: ICD-10-CM

## 2017-06-06 DIAGNOSIS — N189 Chronic kidney disease, unspecified: ICD-10-CM

## 2017-06-06 DIAGNOSIS — Z955 Presence of coronary angioplasty implant and graft: ICD-10-CM

## 2017-06-06 DIAGNOSIS — G9340 Encephalopathy, unspecified: ICD-10-CM

## 2017-06-06 DIAGNOSIS — Z794 Long term (current) use of insulin: ICD-10-CM

## 2017-06-06 DIAGNOSIS — N390 Urinary tract infection, site not specified: ICD-10-CM

## 2017-06-06 DIAGNOSIS — B373 Candidiasis of vulva and vagina: ICD-10-CM

## 2017-06-06 DIAGNOSIS — N179 Acute kidney failure, unspecified: ICD-10-CM

## 2017-06-06 DIAGNOSIS — E1122 Type 2 diabetes mellitus with diabetic chronic kidney disease: ICD-10-CM

## 2017-06-06 DIAGNOSIS — I5033 Acute on chronic diastolic (congestive) heart failure: ICD-10-CM

## 2017-06-06 DIAGNOSIS — E1159 Type 2 diabetes mellitus with other circulatory complications: ICD-10-CM

## 2017-06-06 DIAGNOSIS — K146 Glossodynia: ICD-10-CM

## 2017-06-06 DIAGNOSIS — J9601 Acute respiratory failure with hypoxia: ICD-10-CM

## 2017-06-06 DIAGNOSIS — Z87891 Personal history of nicotine dependence: ICD-10-CM

## 2017-06-06 DIAGNOSIS — E785 Hyperlipidemia, unspecified: ICD-10-CM

## 2017-06-06 DIAGNOSIS — J189 Pneumonia, unspecified organism: ICD-10-CM

## 2017-06-06 DIAGNOSIS — I480 Paroxysmal atrial fibrillation: ICD-10-CM

## 2017-06-06 DIAGNOSIS — E874 Mixed disorder of acid-base balance: ICD-10-CM

## 2017-06-06 DIAGNOSIS — I130 Hypertensive heart and chronic kidney disease with heart failure and stage 1 through stage 4 chronic kidney disease, or unspecified chronic kidney disease: ICD-10-CM

## 2017-06-06 LAB — POC GLUCOSE
Lab: 117 mg/dL — ABNORMAL HIGH (ref 70–100)
Lab: 118 mg/dL — ABNORMAL HIGH (ref 70–100)
Lab: 132 mg/dL — ABNORMAL HIGH (ref 70–100)
Lab: 144 mg/dL — ABNORMAL HIGH (ref 70–100)
Lab: 158 mg/dL — ABNORMAL HIGH (ref 70–100)

## 2017-06-06 LAB — BASIC METABOLIC PANEL: Lab: 140 MMOL/L — ABNORMAL LOW (ref >60)

## 2017-06-06 LAB — MAGNESIUM: Lab: 2 mg/dL — ABNORMAL LOW (ref >60)

## 2017-06-06 MED ORDER — SENNOSIDES-DOCUSATE SODIUM 8.6-50 MG PO TAB
1 | Freq: Every evening | ORAL | 0 refills | Status: AC
Start: 2017-06-06 — End: 2017-09-18

## 2017-06-06 MED ORDER — METOPROLOL TARTRATE 50 MG PO TAB
50 mg | ORAL_TABLET | Freq: Two times a day (BID) | ORAL | 1 refills | 90.00000 days | Status: AC
Start: 2017-06-06 — End: 2017-06-24

## 2017-06-06 MED ORDER — FUROSEMIDE 80 MG PO TAB
80 mg | ORAL_TABLET | Freq: Every morning | ORAL | 3 refills | 90.00000 days | Status: AC
Start: 2017-06-06 — End: 2017-06-24

## 2017-06-06 MED ORDER — SIMETHICONE 80 MG PO CHEW
80 mg | ORAL_TABLET | ORAL | 0 refills | Status: AC | PRN
Start: 2017-06-06 — End: 2017-09-18

## 2017-06-06 MED ORDER — CETIRIZINE 10 MG PO TAB
10 mg | Freq: Every day | ORAL | 0 refills | Status: DC
Start: 2017-06-06 — End: 2017-06-06

## 2017-06-06 MED ORDER — POLYETHYLENE GLYCOL 3350 17 GRAM PO PWPK
17 g | Freq: Two times a day (BID) | ORAL | 3 refills | 18.00000 days | Status: AC | PRN
Start: 2017-06-06 — End: 2017-06-19

## 2017-06-06 MED ORDER — CETIRIZINE 1 MG/ML PO SOLN
10 mg | Freq: Every day | ORAL | 0 refills | Status: DC
Start: 2017-06-06 — End: 2017-06-06

## 2017-06-06 MED ORDER — CETIRIZINE 10 MG PO TAB
10 mg | Freq: Every day | ORAL | 0 refills | Status: DC
Start: 2017-06-06 — End: 2017-06-06
  Administered 2017-06-06: 07:00:00 10 mg via ORAL

## 2017-06-06 NOTE — Discharge Instructions - Pharmacy
Physician Discharge Summary      Name: Diane Savage  Medical Record Number: [id number]        Account Number:  [id number]  Date Of Birth:  [birth year]                         Age:  62 years   Admit date:  05/20/2017                     Discharge date:  06/06/2017    Attending Physician:  Charlesetta Shanks               Service: Med 2- 2043    Physician Summary completed by: Rosezena Sensor, MD    Reason for hospitalization: Septic shock, acute encephalopathy, hypercarbic and hypoxemic respiratory failure, AKI on CKD, thought secondary to HCAP and E. coli UTI    Significant PMH:   Past Medical History:   Diagnosis Date   ??? Acquired hypothyroidism    ??? Arthritis    ??? Back pain    ??? Bleeding disorder (HCC)    ??? CAD (coronary artery disease), native coronary artery 02/04/2017   ??? Chronic diastolic heart failure (HCC) 03/19/2017   ??? Coronary artery disease    ??? Diabetes mellitus (HCC)     Type II   ??? Essential hypertension 02/11/2017   ??? Hypertension    ??? Stomach disorder    ??? Vision decreased           Allergies: Sulfa (sulfonamide antibiotics); Duricef [cefadroxil]; Pravastatin; and Simvastatin    Admission Physical Exam notable for:    Gen: Sedated in no acute distress, morbidly obese age appearing female. dry mucous membranes.  Right CBC, ETT, OGT in place  Eyes: PERRLA, no icterus  Neck: Supple, No JVP appreciated  Chest: mechanical BS,  decreased BS L base  Heart: Irregular, tachy, No murmurs appreciated  Abd: Obese, distended, NBS, mild fluid wave  Extremities: pulses 2+ Bilat LE, Diffuse 4+ pitting edema to thighs bilat  Neuro: Sedated,   Skin: no lesions, bruising, petechia  Lines: Femoral Arterial line, R IJ, R chest wall PIV.      Admission Lab/Radiology studies notable for:   Hemoglobin 8.8.  CO2 26  Normal anion gap 11  Creatinine 1.8  Lactate 1.3  Tn 0.02  BNP 247 (through previously 1180)  UA with 1+ LE, 20-50 WBC, few bacteria, packed yeast  ABG 7.40/46/118/27 Chest X-Ray: Layering left pleural effusion and fairly densly consolidaded left base with air bronchograms      Brief Hospital Course:  The patient was admitted and the following issues were addressed during this hospitalization: (with pertinent details).      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    She was admitted with encephalopathy and respiratory failure requiring intubation, atrial fibrillation with rapid ventricular response, septic shock secondary to urinary tract infection and healthcare acquired pneumonia. She was on norepinephrine on arrival, however this was able to be stopped and patient was extubated the next morning. Diuresis was removed while in the ICU. Patient had been having well controlled atrial fibrillation with rate controlled, and amiodarone was discontinued  in the ICU. Metoprolol and cardizem were resumed, at lower doses than prior to admission. Over the night of 12/21-22, the patient became bradycardic and hypotensive requiring atropine, fluids, and a short term dopamine drip, with subsequent improvement--cardizem was discontinued. Antibiotics for sepsis were narrowed to ceftriaxone and doxycycline and were completed 12/23. Patient was transferred to the floor on 12/22 in stable condition. On arrival, patient noted to be edematous with anasarca and complaining of tongue swelling wit ha sensation of thrush, vaginal itching. Given course of fluconazole and started on lasix as diuresis. During the course, patient had good urine output. Patient did have constipation and abdominal discomfort, given bowel regimen with resulting diarrhea, reduced bowel regimen mildly with normalization of stool output. Patient had a few episodes of chest discomfort, chest tenderness to palpation, for which she received EKG, troponin, although these did not reveal evidence of acute cardiac abnormalities. During one episode of chest pain with abdominal pain, attempted GI cocktail with some improvement in symptoms. Patient had elevated bicarbonate after days of continued diuresis and attempted 1 dose of diamox for diuresis to improve bicarbonate: patient seen later that day and felt significant malaise, headache, abdominal pain. Administered migraine cocktail, held diuresis for a day and resumed with PO diuresis subsequently: patient had significant improvement in symptoms. No further episodes of chest or abdominal pain afterwards. While patient on floor, also known to be very deconditioned, per physical therapy recommendations, patient required additional treatment in an inpatient setting for mobility, and patient unable to live on own/with husband alone given mobility needs (husband in wheelchair and unable to fully assist if she needed help). Planned for inpatient rehab, however barrier given pending insurance until 1/1. Continued diuresis in that time frame with significant improvement of volume status.     Condition at Discharge: Stable    Discharge Diagnoses:       Hospital Problems        Active Problems    Type 2 diabetes mellitus with circulatory disorder, without long-term current use of insulin (HCC)    PAF (paroxysmal atrial fibrillation) (HCC)    Chronic gastrointestinal bleeding    HLD (hyperlipidemia)    Essential hypertension    Acute on chronic diastolic heart failure due to coronary artery disease (HCC)    Morbid obesity (HCC)       Resolved Problems    RESOLVED: Sepsis (HCC)    RESOLVED: Acute hypoxemic respiratory failure (HCC) RESOLVED: Atrial fibrillation with rapid ventricular response (HCC)      Medication changes:  Diltiazem held due to hypotension  Metoprolol changed to lower dose due to hypotension, patient rate controlled on amiodarone alone in ICU.  Lasix 80 mg for diuresis  Simethicone-given for flatulence while in the hospital, can continue to take  Miralax given for constipation  senna/docusate- decreased due to diarrhea while on increased regimen.     Surgical Procedures: None    Significant Diagnostic Studies and Procedures: noted in brief hospital course    Consults:  Rehabilitative Medicine    Patient Disposition: Outside Rehabilitation Facility       Patient instructions/medications:      Activity as Tolerated    It is important to keep increasing your activity level after you leave the hospital.  Moving around can help prevent blood clots, lung infection (pneumonia) and other problems.  Gradually increasing the number of times you are up moving around will help you return to your normal activity level more quickly.  Continue to increase the number of  times you are up to the chair and walking daily to return to your normal activity level. Begin to work toward your normal activity level at discharge     Activity as Tolerated    It is important to keep increasing your activity level after you leave the hospital.  Moving around can help prevent blood clots, lung infection (pneumonia) and other problems.  Gradually increasing the number of times you are up moving around will help you return to your normal activity level more quickly.  Continue to increase the number of times you are up to the chair and walking daily to return to your normal activity level. Begin to work towards your normal activity level at discharge.     Diabetes Information    Diabetes is managed by checking your blood sugar, taking your medicine, exercising and eating healthy.    Target blood sugar range is 70 - 140 mg/dL Check blood sugars before meals and at bedtime    Treatment of low blood sugars: If your blood sugar is low, you may feel shaky, weak, sweaty, hungry, confused, or have a headache. Check your blood sugar with a glucose meter. If it is below 70 treat with one of the following: juice (4 oz.) or regular soda (4 oz.) or 4-5 hard candies. Recheck your blood sugar in 15 minutes and repeat this until level is above 70.    Follow up with your doctor and inform them of any changes to your diabetes plan of care.  *Talk with your doctor before stopping any medication or if you need refills on supplies or medications.  *Eat three meals each day at regularly scheduled intervals (about 4-5 hours apart). It helps to control your blood sugar. Each meal should have equal amounts of carbohydrates.  *Check your feet every day for blisters, cuts, and redness. Call your doctor if you have an infection, wound or cut anywhere on your feet.  *Get a dilated eye exam each year.  *Wear a medic alert ID if you are on insulin.  *In an emergency contact your healthcare provider or go to the emergency room.    A free class is available for diabetes education. The class is held in the [facility] on the 5th floor of the Medical Office Building, every Monday from 10:00-12:00 noon and every Wednesday 3:00 pm - 5:00 pm. The class is not held on actual or observed holidays. You do not need an appointment for this and we will not bill your insurance.    More diabetes education and individual nutrition appointments are offered at the [facility]. Most insurance plans cover these services if you have a referral from your doctor. Call [phone], option 3 or go to [email or web address] for details.     Report These Signs and Symptoms    Please contact your doctor if you have any of the following symptoms: temperature higher than 100 degrees F, persistent nausea and/or vomiting, difficulty breathing, chest pain or unable to urinate Questions About Your Stay    For questions or concerns regarding your hospital stay. Call [phone]     Discharging attending physician: ABEBE, ABEBE [[id number]]      Cardiac Diet    Limiting unhealthy fats and cholesterol is the most important step you can take in reducing your risk for cardiovascular disease.  Unhealthy fats include saturated and trans fats.  Monitor your sodium and cholesterol intake.  Restrict your sodium to 2g (grams) or 2000mg  (milligrams) daily, and your cholesterol  to 200mg  daily.    If you have questions regarding your diet at home, you may contact a dietitian at [phone].       Diabetic Diet    You should eat between 1600 and 2000 calories per day.  This is equal to 60g (grams) of carbohydrates per meal, and 30g of carbohydrates for a bedtime snack.    If you have questions about your diet after you go home, you can call a dietitian at [phone].     Fluid Restrictions    Limit the amount of your daily fluids to (milliliters).  This is equal to about 6 cups a day..    If you have questions about your diet after you go home, you can call a dietitian at [phone].     Heart Failure Information    You are at risk for readmission to the hospital because of fluid overload. There are steps you can take to lower your risk:  *Continue your current heart medications. Changes made have been made during your hospital stay.  *Remember to weigh yourself every morning, first thing after you urinate, and write down your weigh. Take this record to your doctor's appointments.  *Chart your symptoms - fatigue, shortness of breath, swelling, etc. Immediately report any worsening.  *Remember to consume no more than 2,000mg  (milligrams) of sodium daily. Watch out for packaged, processed, canned, and restaurant foods especially.  *If any of your symptoms worsen, or if you gain more than 2 pounds in 24 hours, or 5 pounds in a week, call your cardiologist immediately. EARLY REPORTING OF THESE CHANGES IS VERY IMPORTANT.  Condition Code     Obstructive Sleep Apnea    During your hospital stay, there were signs that you may have obstructive sleep apnea. This means that sometimes your breathing is shallow or even stops when you sleep. You or your family might notice that you snore, are restless, or gasp/choke during sleep. This can cause you poor sleep, daytime tiredness, and may cause more health problems. Continue to work with your provider if you have a plan to treat this problem. If you have not been seen in the last 5 years for this, or if you have never been told you might have apnea prior to this hospital visit, we suggest you do so as soon as you can.     Speak with your provider as soon as possible as further testing may be needed.     Report These Signs and Symptoms    Please contact your doctor if you have any of the following symptoms: *Continue medicines as direct on your discharge medication list. Get an up to date list with every visit and take as instructed. Do NOT stop taking any medications without speaking with your doctor or nurse who knows you.    *Remember to weigh yourself first thing in the morning after using the restroom and write it down. Take this record to your doctor appointment.    *Chart symptoms such as fatigue, trouble breathing, or swelling. Call your doctor right away if these symptoms get worse.    *Remember, do not eat more than 2000 milligrams of sodium a day. Watch out for packaged, processed, canned and restaurant foods.    Call your doctor (your cardiologist, if you have one) if:  -you gain more than 2 pounds in 24 hours.  -you gain more than 5 pounds in 1 week.  -any of your symptoms get worse  Do NOT wait  to let your doctor know about these changes.     Questions About Your Stay    For questions or concerns regarding your hospital stay:    - DURING BUSINESS HOURS (8:00 AM - 4:30 PM): Call [phone] and asked to be transferred to your discharge attending physician.    - AFTER BUSINESS HOURS (4:30 PM - 8:00 AM, on weekends, or holidays):  Call [phone] and ask the operator to page the on-call doctor for the discharge attending physician.     Discharging attending physician: ABEBE, ABEBE [id number]      Cardiac Diet    Limiting unhealthy fats and cholesterol is the most important step you can take in reducing your risk for cardiovascular disease.  Unhealthy fats include saturated and trans fats.  Monitor your sodium and cholesterol intake.  Restrict your sodium to 2g (grams) or 2000mg  (milligrams) daily, and your cholesterol to 200mg  daily.    If you have questions regarding your diet at home, you may contact a dietitian at [phone].       Diabetic Diet    You should eat between 1600 and 2000 calories per day.  This is equal to 60g (grams) of carbohydrates per meal, and 30g of carbohydrates for a bedtime snack.    If you have questions about your diet after you go home, you can call a dietitian at [phone].     Fluid Restrictions    Limit the amount of your daily fluids to (milliliters).  This is equal to about 6 cups a day..    If you have questions about your diet after you go home, you can call a dietitian at [phone].     Heart Failure Information    You are at risk for readmission to the hospital because of fluid overload. There are steps you can take to lower your risk:  *Continue your current heart medications. Changes made have been made during your hospital stay.  *Remember to weigh yourself every morning, first thing after you urinate, and write down your weigh. Take this record to your doctor's appointments.  *Chart your symptoms - fatigue, shortness of breath, swelling, etc. Immediately report any worsening.  *Remember to consume no more than 2,000mg  (milligrams) of sodium daily. Watch out for packaged, processed, canned, and restaurant foods especially. *If any of your symptoms worsen, or if you gain more than 2 pounds in 24 hours, or 5 pounds in a week, call your cardiologist immediately. EARLY REPORTING OF THESE CHANGES IS VERY IMPORTANT.     Return Appointment    Pt going to rehab and will have daily physician assessment.      Current Discharge Medication List       START taking these medications    Details   polyethylene glycol 3350 (MIRALAX) 17 g packet Take one packet by mouth twice daily as needed.  Qty: 12 each, Refills: 3    PRESCRIPTION TYPE:  Print      simethicone (MYLICON) 80 mg chew tablet Chew one tablet by mouth every 6 hours as needed for Flatulence.  Qty: 30 tablet, Refills: 0    PRESCRIPTION TYPE:  Print          CONTINUE these medications which have been CHANGED or REFILLED    Details   furosemide (LASIX) 80 mg tablet Take one tablet by mouth every morning.  Qty: 90 tablet, Refills: 3    PRESCRIPTION TYPE:  Print      metoprolol tartrate (LOPRESSOR) 50 mg tablet  Take one tablet by mouth twice daily. This is a reduced dose from what you were taking previously  Qty: 60 tablet, Refills: 1    PRESCRIPTION TYPE:  No Print      senna/docusate (SENOKOT-S) 8.6/50 mg tablet Take one tablet by mouth at bedtime daily.    PRESCRIPTION TYPE:  No Print          CONTINUE these medications which have NOT CHANGED    Details   acetaminophen (TYLENOL) 325 mg tablet Take two tablets by mouth every 6 hours as needed.  Refills: 0    PRESCRIPTION TYPE:  OTC      aspirin 81 mg chewable tablet Chew one tablet by mouth daily with food.  Qty: 90 tablet, Refills: 3    PRESCRIPTION TYPE:  OTC      cetirizine (ZYRTEC) 10 mg tablet Take 10 mg by mouth every morning.    PRESCRIPTION TYPE:  Historical Med      clobetasol (TEMOVATE) 0.05 % topical cream Apply to affected area twice daily as needed    PRESCRIPTION TYPE:  Historical Med      diphenhydrAMINE (BENADRYL ALLERGY) 25 mg tablet Take 25 mg by mouth every 6 hours as needed for Sleep. PRESCRIPTION TYPE:  Historical Med      folic acid (FOLVITE) 1 mg tablet Take 1 mg by mouth daily.    PRESCRIPTION TYPE:  Historical Med      glucosam-chondroit-C-manganese 500-400-2-0.33 mg cap Take 500 mg by mouth twice daily.    PRESCRIPTION TYPE:  Historical Med      insulin aspart U-100 (NOVOLOG) 100 unit/mL injection Inject four Units under the skin three times daily with meals.  Qty: 10 mL, Refills: 30    PRESCRIPTION TYPE:  Normal      insulin NPH (HUMULIN N NPH U-100 INSULIN) 100 unit/mL injection Inject fourteen Units under the skin every morning.  Qty: 10 mL, Refills: 30    PRESCRIPTION TYPE:  Normal      Insulin Syringe-Needle U-100 (BD INSULIN SYRINGE ULTRA-FINE) 0.3 mL 31 gauge x 5/16 syrg Use four times daily with Insulin  Qty: 100 each, Refills: 0    PRESCRIPTION TYPE:  Normal  Comments: Please substitute per stock availability      levothyroxine (SYNTHROID) 175 mcg tablet Take 175 mcg by mouth daily 30 minutes before breakfast.    PRESCRIPTION TYPE:  Historical Med      nitroglycerin (NITROSTAT) 0.4 mg tablet Place 0.4 mg under tongue every 5 minutes as needed for Chest Pain. Max of 3 tablets, call 911.    PRESCRIPTION TYPE:  Historical Med      omeprazole DR(+) (PRILOSEC) 40 mg capsule Take 40 mg by mouth twice daily.    PRESCRIPTION TYPE:  Historical Med      ondansetron (ZOFRAN) 8 mg tablet Take 8 mg by mouth every 8 hours as needed for Nausea or Vomiting.    PRESCRIPTION TYPE:  Historical Med      potassium chloride SR (K-DUR) 20 mEq tablet Take one tablet by mouth daily. Take with a meal and a full glass of water.  Qty: 90 tablet, Refills: 3    PRESCRIPTION TYPE:  Normal      pramoxine/zinc oxide (TRONOLANE) 1% / 5% topical cream Insert or Apply  to rectal area as directed daily as needed (For Hemorrhoids).  Qty: 28 g, Refills: 0    PRESCRIPTION TYPE:  Normal      tiZANidine (ZANAFLEX) 4 mg tablet Take 4 mg by  mouth at bedtime daily.    PRESCRIPTION TYPE:  Historical Med traMADol (ULTRAM) 50 mg tablet Take one tablet by mouth every 6 hours as needed for Pain.  Qty: 30 tablet, Refills: 0    PRESCRIPTION TYPE:  Print          The following medications were removed from your list. This list includes medications discontinued this stay and those removed from your prior med list in our system        diltiazem CD (CARDIZEM CD) 180 mg capsule               Scheduled appointments:    Jun 19, 2017  1:40 PM CST  New Patient with Cardell Peach, MD  [facility] [place] [address]  [address]  [place] [address]  [phone]   Jun 24, 2017  2:00 PM CST  RETURN PT LONG with Baldo Ash, MD  [facility] ([facility] [address]  [phone] [address]  [place] [address]  [phone]    Additional appointment instructions:      Please keep previously scheduled follow up                    Signed:  Rosezena Sensor, MD  06/06/2017      cc:  Primary Care Physician:  Sherre Lain   Verified  Referring physicians:  Sandria Senter, DO   Additional provider(s):

## 2017-06-06 NOTE — Progress Notes
Diane Savage discharged on 06/06/2017.  Equipment Removed: Telepack.  Discharge instructions reviewed with patient.  Valuables returned:   Where Are Valuables Stored?: bedside.  Home medications:    n/a  Functional assessment at discharge complete: Yes .  Discharge instructions reviewed with patient. AVS provided. All questions and concerns addressed. All personal belongings sent with patient. Patient's husband to transport to [employer], discharge transfer packet provided.

## 2017-06-06 NOTE — Progress Notes
Shift: Day    NEWS Score:    Pain:back pain, resolved with tylenol    Nutrition:cardiac/ADA, good appetite    GI/GU: voids, BM today    Activity: X 1 assist with a walker to the commode    Family: Husband by the bedside    Last Shower: Today    New Events or Follow-up: plan of care, possible DC in AM

## 2017-06-06 NOTE — Progress Notes
Patient Problem called about: (document change in assessment or concern): TEXT PAGE SENT: PT C/O RUNNY NOSE & WATERY EYES. NORMALLY TAKES ZYRTEC AT HOME CAN SHE GET AN ORDER PLEASE?    Time MD/NP Notified:  0005    MD/NP Name:  MED 2 ON CALL PAGER 2043    MD/NP Response:      Interventions:

## 2017-06-06 NOTE — Progress Notes
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 (HCC)    Morbid obesity (HCC)        #Acute Hypercarbic???& Hypoxemic???Respiratory???Failure (Improved)  #L Pleural Effusion  - d/t HCAP   -???CT chest (OSH):???circumferential???L???pleural effusion???&???densely consolidated???L???base???w/???air bronchograms  - extubated 12/19  - currently on 2 L/NC, satting well, was discharged home on 10/25 on room air from HF service, but states she had 2L O2 prior to admission  PLAN  - Continue Lasix 80mg  PO qD at D/c  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 pt aware of risk of aspiration  - SLP recs 12/22: Continue regular solids, thin liquids    #DM2  -???PTA regimen:???NPH 14 units Q AM, novolog 4 units TID  - BS 110-160s last 24 hrs  PLAN  - continue 14U NPH daily + low dose correction factor    #Hypothyroidism  -???TSH???1.99 (12/18)  PLAN  -???Continue???PTA???synthroid daily    ???  FEN  - Cardiac/diabetic diet, 1.5 L restriction  - IVF: none  - review electrolytes and replace as needed  DVT PPx: SCDs; SQ heparin  Code Status: Full Code  Disposition/Family: d/c  to [facility]    Patient seen and discussed with Dr. Smith Mince  ???  Bridget Hartshorn  PGY-1  Pager [phone]    ________________________________________________________________________    Diane Savage is a 62 y.o. female. No acute overnight events. Patient doing well today. Denies chest pain, abd pain at this time. Mild headache, 3/10, manageable for her. Improved edema, patient very pleased with this. Patient walking to door of room at this time. BMs at baseline.     Medications  Scheduled Meds:    aspirin chewable tablet 81 mg 81 mg Oral QDAY   cetirizine (ZYRTEC) tablet 10 mg 10 mg Oral QDAY   dextran 70/hypromellose (NATURAL BALANCE TEARS) 0.1/0.3 % ophthalmic solution 1 drop 1 drop Both Eyes QID folic acid (FOLVITE) tablet 1 mg 1 mg Oral QDAY   furosemide (LASIX) tablet 80 mg 80 mg Oral BID(9-17)   heparin (porcine) PF syringe 5,000 Units 5,000 Units Subcutaneous Q8H   insulin aspart U-100 (NOVOLOG FLEXPEN) injection PEN 0-7 Units 0-7 Units Subcutaneous ACHS   insulin NPH (HUMULIN N KwikPen) injection PEN 14 Units 14 Units Subcutaneous QDAY(07)   levothyroxine (SYNTHROID) tablet 175 mcg 175 mcg Oral QDAY(07)   magnesium oxide (MAG-OX) tablet 400 mg 400 mg Oral BID   melatonin tablet 3 mg 3 mg Oral QHS   metoprolol tartrate (LOPRESSOR) tablet 50 mg 50 mg Oral BID   pantoprazole DR (PROTONIX) tablet 40 mg 40 mg Oral BID(11-21)   potassium chloride SR (K-DUR) tablet 20 mEq 20 mEq Oral QDAY(12)   senna/docusate (SENOKOT-S) tablet 1 tablet 1 tablet Oral BID   tiZANidine (ZANAFLEX) tablet 4 mg 4 mg Oral QHS   vitamins, multiple tablet 1 tablet 1 tablet Oral QDAY   Continuous Infusions:  PRN and Respiratory Meds:acetaminophen Q6H PRN, albuterol 0.5% Q4H PRN, dextromethorphan/guaiFENesin Q4H PRN, ipratropium bromide Q4H PRN, polyethylene glycol 3350 BID PRN, prochlorperazine Q6H PRN, simethicone Q6H PRN, sodium chloride PRN, traMADol Q6H PRN      Review of Systems:  Constitutional: Negative  Eyes: negative  Ears, nose, mouth, throat, and face: negative  Respiratory: -SOA (at BL)  Cardiovascular: -Chest pain  Gastrointestinal: -Abd pain  Genitourinary:negative  Neurologic: negative  Objective:                          Vital Signs: Last Filed                 Vital Signs: 24 Hour Range   BP: 146/75 (01/04 0824)  Temp: 36.7 ???C (98 ???F) (01/04 9604)  Pulse: 88 (01/04 0824)  Respirations: 18 PER MINUTE (01/04 0824)  SpO2: 95 % (01/04 0824)  O2 Delivery: Nasal Cannula (01/04 0824) BP: (113-146)/(47-75)   Temp:  [36.6 ???C (97.8 ???F)-37.1 ???C (98.8 ???F)]   Pulse:  [82-110]   Respirations:  [18 PER MINUTE-20 PER MINUTE]   SpO2:  [95 %-98 %]   O2 Delivery: Nasal Cannula Intensity Pain Scale (Self Report): 5 (06/05/17 2025) Vitals:    06/05/17 0450 06/05/17 0947 06/06/17 0500   Weight: 122.9 kg (271 lb) 118.6 kg (261 lb 8 oz) 120 kg (264 lb 8.8 oz)       Intake/Output Summary:  (Last 24 hours)    Intake/Output Summary (Last 24 hours) at 06/06/2017 0923  Last data filed at 06/06/2017 0825  Gross per 24 hour   Intake 982 ml   Output 1175 ml   Net -193 ml      Stool  Occurrence: 0    Physical Exam  General Appearance: Appears overweight  well nourished, stated age and in no acute distress.   PSYCH: Normal behavior, goal directed thinking, normal speech (rate and volume)  HEENT: Normocephalic and atraumatic, mucous membranes moist. Sclera anicteric bilaterally.   CV: S1 S2 RRR. No murmurs, rubs, clicks, gallops or S4. Distal extremities warm without cyanosis or clubbing. Radial pulses 2+ bilaterally and symmetric.  PULM: No wheezes, rhonchi, increased effort of breathing, or diminished lung sounds in anterior lung bases. Rales auscultated in lower lung fields bilaterally  ABD: Soft, obese, NTTP  EXT: 2+ pitting edema to BLE and 1+ to BUE (L>R) with increased wrinkling noted. No obvious deformity to BLE other than previously noted saphenous vein scarring.   SKIN: Warm and dry to touch. Pallor noted.  NEURO: Tone grossly normal, cranial nerves 2-12 grossly intact, tracks to voice. Alert and interactive.     Lab Review  24-hour labs:    Results for orders placed or performed during the hospital encounter of 05/20/17 (from the past 24 hour(s))   CBC    Collection Time: 06/05/17 10:03 AM   Result Value Ref Range    White Blood Cells 3.7 (L) 4.5 - 11.0 K/UL    RBC 2.75 (L) 4.0 - 5.0 M/UL    Hemoglobin 8.5 (L) 12.0 - 15.0 GM/DL    Hematocrit 42.5 (L) 36 - 45 %    MCV 96.8 80 - 100 FL    MCH 31.0 26 - 34 PG    MCHC 32.1 32.0 - 36.0 G/DL    RDW 95.6 (H) 11 - 15 %    Platelet Count 114 (L) 150 - 400 K/UL    MPV 8.6 7 - 11 FL   POC GLUCOSE    Collection Time: 06/05/17  1:08 PM Result Value Ref Range    Glucose, POC 101 (H) 70 - 100 MG/DL   POC GLUCOSE    Collection Time: 06/05/17  6:14 PM   Result Value Ref Range    Glucose, POC 117 (H) 70 - 100 MG/DL   POC GLUCOSE    Collection Time: 06/05/17  8:24 PM   Result Value Ref Range    Glucose, POC 158 (H) 70 - 100 MG/DL   POC GLUCOSE    Collection Time: 06/06/17  3:09 AM   Result Value Ref Range    Glucose, POC 144 (H) 70 - 100 MG/DL   POC GLUCOSE    Collection Time: 06/06/17  4:17 AM   Result Value Ref Range    Glucose, POC 132 (H) 70 - 100 MG/DL   BASIC METABOLIC PANEL    Collection Time: 06/06/17  6:08 AM   Result Value Ref Range    Sodium 140 137 - 147 MMOL/L    Potassium 3.7 3.5 - 5.1 MMOL/L    Chloride 96 (L) 98 - 110 MMOL/L    CO2 39 (H) 21 - 30 MMOL/L    Anion Gap 5 3 - 12    Glucose 132 (H) 70 - 100 MG/DL    Blood Urea Nitrogen 17 7 - 25 MG/DL    Creatinine 3.87 (H) 0.4 - 1.00 MG/DL    Calcium 9.3 8.5 - 56.4 MG/DL    eGFR Non African American 45 (L) >60 mL/min    eGFR African American 54 (L) >60 mL/min   MAGNESIUM    Collection Time: 06/06/17  6:08 AM   Result Value Ref Range    Magnesium 2.0 1.6 -  2.6 mg/dL   POC GLUCOSE    Collection Time: 06/06/17  7:26 AM   Result Value Ref Range    Glucose, POC 118 (H) 70 - 100 MG/DL       Point of Care Testing  (Last 24 hours)  Glucose: (!) 132 (06/06/17 0608)  POC Glucose (Download): (!) 118 (06/06/17 1610)    Radiology and other Diagnostics Review:    Pertinent radiology reviewed.    Rosezena Sensor, MD   [phone]

## 2017-06-06 NOTE — Case Management (ED)
CMA Note:       Printed and placed transfer packet in the pt's wall unit per request from SWCM Cori Paschang       Case Management Assistant  For additional assistance please contact SWCM Cori Paschang  *[phone]

## 2017-06-06 NOTE — Progress Notes
Heart Failure Nursing Progress Note    Admission Date: 05/20/2017  LOS: 16 days    Admission Weight: 132 kg (291 lb 0.1 oz)        Most recent weights (inpatient):   Vitals:    06/04/17 0530 06/05/17 0450 06/05/17 0947   Weight: 126.3 kg (278 lb 7.1 oz) 122.9 kg (271 lb) 118.6 kg (261 lb 8 oz)     Weight change from previous day:??    Fluid restriction ordered: 1.5 L    Intake/Output Summary: (Last 24 hours)    Intake/Output Summary (Last 24 hours) at 06/05/2017 1852  Last data filed at 06/05/2017 1759  Gross per 24 hour   Intake 1244 ml   Output 1550 ml   Net -306 ml       Is patient incontinent No    Anticipated discharge date: 1/4  Discharge goals: daily weight, 1.5 FR      Daily Assessment of Patient Stated Goals:    Short Term Goal Identified by patient (Short Term=during hospitalization):  Daily weight, 1.5 FR

## 2017-06-17 ENCOUNTER — Encounter: Admit: 2017-06-17 | Discharge: 2017-06-17 | Payer: MEDICARE

## 2017-06-18 ENCOUNTER — Encounter: Admit: 2017-06-18 | Discharge: 2017-06-18 | Payer: MEDICARE

## 2017-06-19 ENCOUNTER — Ambulatory Visit: Admit: 2017-06-19 | Discharge: 2017-06-19 | Payer: MEDICARE

## 2017-06-19 ENCOUNTER — Encounter: Admit: 2017-06-19 | Discharge: 2017-06-19 | Payer: MEDICARE

## 2017-06-19 DIAGNOSIS — K2270 Barrett's esophagus without dysplasia: ICD-10-CM

## 2017-06-19 DIAGNOSIS — I10 Essential (primary) hypertension: Secondary | ICD-10-CM

## 2017-06-19 DIAGNOSIS — E785 Hyperlipidemia, unspecified: ICD-10-CM

## 2017-06-19 DIAGNOSIS — N179 Acute kidney failure, unspecified: ICD-10-CM

## 2017-06-19 DIAGNOSIS — I5033 Acute on chronic diastolic (congestive) heart failure: Principal | ICD-10-CM

## 2017-06-19 DIAGNOSIS — K7460 Unspecified cirrhosis of liver: ICD-10-CM

## 2017-06-19 DIAGNOSIS — D699 Hemorrhagic condition, unspecified: ICD-10-CM

## 2017-06-19 DIAGNOSIS — K922 Gastrointestinal hemorrhage, unspecified: ICD-10-CM

## 2017-06-19 DIAGNOSIS — I25119 Atherosclerotic heart disease of native coronary artery with unspecified angina pectoris: ICD-10-CM

## 2017-06-19 DIAGNOSIS — D649 Anemia, unspecified: ICD-10-CM

## 2017-06-19 DIAGNOSIS — I8500 Esophageal varices without bleeding: ICD-10-CM

## 2017-06-19 DIAGNOSIS — K31819 Angiodysplasia of stomach and duodenum without bleeding: ICD-10-CM

## 2017-06-19 DIAGNOSIS — R0600 Dyspnea, unspecified: ICD-10-CM

## 2017-06-19 DIAGNOSIS — E039 Hypothyroidism, unspecified: ICD-10-CM

## 2017-06-19 DIAGNOSIS — I5032 Chronic diastolic (congestive) heart failure: ICD-10-CM

## 2017-06-19 DIAGNOSIS — I2510 Atherosclerotic heart disease of native coronary artery without angina pectoris: ICD-10-CM

## 2017-06-19 DIAGNOSIS — M1990 Unspecified osteoarthritis, unspecified site: ICD-10-CM

## 2017-06-19 DIAGNOSIS — B1910 Unspecified viral hepatitis B without hepatic coma: ICD-10-CM

## 2017-06-19 DIAGNOSIS — E119 Type 2 diabetes mellitus without complications: ICD-10-CM

## 2017-06-19 DIAGNOSIS — M549 Dorsalgia, unspecified: ICD-10-CM

## 2017-06-19 DIAGNOSIS — K319 Disease of stomach and duodenum, unspecified: ICD-10-CM

## 2017-06-19 DIAGNOSIS — H547 Unspecified visual loss: ICD-10-CM

## 2017-06-19 DIAGNOSIS — E1159 Type 2 diabetes mellitus with other circulatory complications: ICD-10-CM

## 2017-06-19 LAB — CBC AND DIFF
Lab: 0 % (ref 0–2)
Lab: 0 10*3/uL (ref 0–0.20)
Lab: 0.2 10*3/uL (ref 0–0.45)
Lab: 0.6 10*3/uL (ref 0–0.80)
Lab: 0.7 10*3/uL — ABNORMAL LOW (ref 1.0–4.8)
Lab: 12 % (ref 4–12)
Lab: 13 % — ABNORMAL LOW (ref 24–44)
Lab: 17 % — ABNORMAL HIGH (ref >60)
Lab: 170 10*3/uL — ABNORMAL LOW (ref >60)
Lab: 29 pg (ref 26–34)
Lab: 3 % (ref 0–5)
Lab: 3.3 M/UL — ABNORMAL LOW (ref 4.0–5.0)
Lab: 3.8 10*3/uL (ref 1.8–7.0)
Lab: 30 % — ABNORMAL LOW (ref 36–45)
Lab: 32 g/dL (ref 32.0–36.0)
Lab: 5.3 K/UL (ref 4.5–11.0)
Lab: 72 % (ref 41–77)
Lab: 8 FL (ref 7–11)
Lab: 9.8 g/dL — ABNORMAL LOW (ref 12.0–15.0)
Lab: 91 FL — ABNORMAL HIGH (ref 80–100)

## 2017-06-19 LAB — PROTIME INR (PT): Lab: 1.1 MMOL/L — ABNORMAL LOW (ref 0.8–1.2)

## 2017-06-19 LAB — COMPREHENSIVE METABOLIC PANEL
Lab: 1.8 mg/dL — ABNORMAL HIGH (ref 0.4–1.00)
Lab: 136 MMOL/L — ABNORMAL LOW (ref 137–147)
Lab: 28 mg/dL — ABNORMAL HIGH (ref 7–25)
Lab: 4.3 MMOL/L (ref 3.5–5.1)
Lab: 7.1 g/dL (ref 6.0–8.0)
Lab: 9.5 mg/dL (ref 8.5–10.6)
Lab: 99 mg/dL (ref 70–100)

## 2017-06-19 MED ORDER — SODIUM CHLORIDE 0.9 % IV SOLP
INTRAVENOUS | 0 refills | Status: CN
Start: 2017-06-19 — End: ?

## 2017-06-20 DIAGNOSIS — D500 Iron deficiency anemia secondary to blood loss (chronic): Principal | ICD-10-CM

## 2017-06-20 DIAGNOSIS — K7460 Unspecified cirrhosis of liver: Secondary | ICD-10-CM

## 2017-06-24 ENCOUNTER — Encounter: Admit: 2017-06-24 | Discharge: 2017-06-24 | Payer: MEDICARE

## 2017-06-24 DIAGNOSIS — D699 Hemorrhagic condition, unspecified: ICD-10-CM

## 2017-06-24 DIAGNOSIS — I2510 Atherosclerotic heart disease of native coronary artery without angina pectoris: Principal | ICD-10-CM

## 2017-06-24 DIAGNOSIS — E119 Type 2 diabetes mellitus without complications: ICD-10-CM

## 2017-06-24 DIAGNOSIS — I10 Essential (primary) hypertension: Secondary | ICD-10-CM

## 2017-06-24 DIAGNOSIS — M549 Dorsalgia, unspecified: ICD-10-CM

## 2017-06-24 DIAGNOSIS — M1990 Unspecified osteoarthritis, unspecified site: ICD-10-CM

## 2017-06-24 DIAGNOSIS — E039 Hypothyroidism, unspecified: ICD-10-CM

## 2017-06-24 DIAGNOSIS — I5032 Chronic diastolic (congestive) heart failure: ICD-10-CM

## 2017-06-24 DIAGNOSIS — K319 Disease of stomach and duodenum, unspecified: ICD-10-CM

## 2017-06-24 DIAGNOSIS — H547 Unspecified visual loss: ICD-10-CM

## 2017-06-24 MED ORDER — METOPROLOL TARTRATE 37.5 MG PO TAB
37.5 mg | ORAL_TABLET | Freq: Two times a day (BID) | ORAL | 3 refills | 90.00000 days | Status: AC
Start: 2017-06-24 — End: 2017-11-18

## 2017-06-24 MED ORDER — SPIRONOLACTONE 25 MG PO TAB
12.5 mg | ORAL_TABLET | Freq: Every day | ORAL | 3 refills | 46.00000 days | Status: AC
Start: 2017-06-24 — End: 2017-08-12

## 2017-06-24 MED ORDER — BUMETANIDE 1 MG PO TAB
1 mg | ORAL_TABLET | Freq: Two times a day (BID) | ORAL | 3 refills | Status: AC
Start: 2017-06-24 — End: 2017-08-12

## 2017-06-25 ENCOUNTER — Ambulatory Visit: Admit: 2017-06-25 | Discharge: 2017-06-25 | Payer: MEDICARE

## 2017-06-25 ENCOUNTER — Ambulatory Visit: Admit: 2017-06-24 | Discharge: 2017-06-25 | Payer: MEDICARE

## 2017-06-25 ENCOUNTER — Encounter: Admit: 2017-06-25 | Discharge: 2017-06-25 | Payer: MEDICARE

## 2017-06-25 DIAGNOSIS — K8020 Calculus of gallbladder without cholecystitis without obstruction: ICD-10-CM

## 2017-06-25 DIAGNOSIS — K922 Gastrointestinal hemorrhage, unspecified: ICD-10-CM

## 2017-06-25 DIAGNOSIS — M1990 Unspecified osteoarthritis, unspecified site: ICD-10-CM

## 2017-06-25 DIAGNOSIS — I25119 Atherosclerotic heart disease of native coronary artery with unspecified angina pectoris: Principal | ICD-10-CM

## 2017-06-25 DIAGNOSIS — I480 Paroxysmal atrial fibrillation: ICD-10-CM

## 2017-06-25 DIAGNOSIS — E1159 Type 2 diabetes mellitus with other circulatory complications: ICD-10-CM

## 2017-06-25 DIAGNOSIS — I5032 Chronic diastolic (congestive) heart failure: ICD-10-CM

## 2017-06-25 DIAGNOSIS — R188 Other ascites: ICD-10-CM

## 2017-06-25 DIAGNOSIS — I10 Essential (primary) hypertension: ICD-10-CM

## 2017-06-25 DIAGNOSIS — D699 Hemorrhagic condition, unspecified: ICD-10-CM

## 2017-06-25 DIAGNOSIS — I5033 Acute on chronic diastolic (congestive) heart failure: ICD-10-CM

## 2017-06-25 DIAGNOSIS — M549 Dorsalgia, unspecified: ICD-10-CM

## 2017-06-25 DIAGNOSIS — N179 Acute kidney failure, unspecified: ICD-10-CM

## 2017-06-25 DIAGNOSIS — K7460 Unspecified cirrhosis of liver: Principal | ICD-10-CM

## 2017-06-25 DIAGNOSIS — I2510 Atherosclerotic heart disease of native coronary artery without angina pectoris: ICD-10-CM

## 2017-06-25 DIAGNOSIS — K319 Disease of stomach and duodenum, unspecified: ICD-10-CM

## 2017-06-25 DIAGNOSIS — E785 Hyperlipidemia, unspecified: ICD-10-CM

## 2017-06-25 DIAGNOSIS — E119 Type 2 diabetes mellitus without complications: ICD-10-CM

## 2017-06-25 DIAGNOSIS — K766 Portal hypertension: ICD-10-CM

## 2017-06-25 DIAGNOSIS — E039 Hypothyroidism, unspecified: ICD-10-CM

## 2017-06-25 DIAGNOSIS — I214 Non-ST elevation (NSTEMI) myocardial infarction: ICD-10-CM

## 2017-06-25 DIAGNOSIS — H547 Unspecified visual loss: ICD-10-CM

## 2017-06-25 LAB — PERITONEAL FLUID TOTAL PROTEIN: Lab: 2 g/dL

## 2017-06-25 LAB — POC GLUCOSE: Lab: 190 mg/dL — ABNORMAL HIGH (ref 70–100)

## 2017-06-25 MED ORDER — ALBUMIN, HUMAN 25 % IV SOLP
12.5 g | Freq: Once | INTRAVENOUS | 0 refills | Status: CP
Start: 2017-06-25 — End: ?
  Administered 2017-06-25: 18:00:00 12.5 g via INTRAVENOUS

## 2017-06-25 MED ORDER — HEPARIN, PORCINE (PF) 100 UNIT/ML IV SYRG
500 [IU] | Freq: Once | 0 refills | Status: CP
Start: 2017-06-25 — End: ?

## 2017-06-26 LAB — CELL COUNT W/DIFF-FLUIDS
Lab: 1 %
Lab: 15 %
Lab: 41 %
Lab: 43 %
Lab: 430 /uL

## 2017-06-27 ENCOUNTER — Encounter: Admit: 2017-06-27 | Discharge: 2017-06-27 | Payer: MEDICARE

## 2017-06-30 ENCOUNTER — Encounter: Admit: 2017-06-30 | Discharge: 2017-06-30 | Payer: MEDICARE

## 2017-07-01 ENCOUNTER — Encounter: Admit: 2017-07-01 | Discharge: 2017-07-01 | Payer: MEDICARE

## 2017-07-07 ENCOUNTER — Encounter: Admit: 2017-07-07 | Discharge: 2017-07-07 | Payer: MEDICARE

## 2017-07-13 ENCOUNTER — Encounter: Admit: 2017-07-13 | Discharge: 2017-07-13 | Payer: MEDICARE

## 2017-07-13 DIAGNOSIS — D699 Hemorrhagic condition, unspecified: ICD-10-CM

## 2017-07-13 DIAGNOSIS — K319 Disease of stomach and duodenum, unspecified: ICD-10-CM

## 2017-07-13 DIAGNOSIS — E119 Type 2 diabetes mellitus without complications: ICD-10-CM

## 2017-07-13 DIAGNOSIS — E039 Hypothyroidism, unspecified: ICD-10-CM

## 2017-07-13 DIAGNOSIS — I10 Essential (primary) hypertension: ICD-10-CM

## 2017-07-13 DIAGNOSIS — I5032 Chronic diastolic (congestive) heart failure: ICD-10-CM

## 2017-07-13 DIAGNOSIS — I2510 Atherosclerotic heart disease of native coronary artery without angina pectoris: ICD-10-CM

## 2017-07-13 DIAGNOSIS — M549 Dorsalgia, unspecified: ICD-10-CM

## 2017-07-13 DIAGNOSIS — M1990 Unspecified osteoarthritis, unspecified site: ICD-10-CM

## 2017-07-13 DIAGNOSIS — H547 Unspecified visual loss: ICD-10-CM

## 2017-08-05 LAB — BASIC METABOLIC PANEL
Lab: 11
Lab: 12
Lab: 12 — ABNORMAL HIGH (ref 10.0–14.5)
Lab: 139 — ABNORMAL LOW (ref 4.35–5.85)
Lab: 155 — ABNORMAL HIGH (ref 70–105)
Lab: 28
Lab: 3.7 — ABNORMAL HIGH (ref 0.60–1.30)
Lab: 39 — ABNORMAL HIGH (ref 7–18)
Lab: 9.3

## 2017-08-11 LAB — BASIC METABOLIC PANEL
Lab: 101
Lab: 110 — ABNORMAL HIGH (ref 70–105)
Lab: 142
Lab: 29
Lab: 3.6 — ABNORMAL HIGH (ref 0.60–1.30)
Lab: 4.5
Lab: 41 — ABNORMAL HIGH (ref 7–18)
Lab: 9.1

## 2017-08-11 LAB — CBC AND DIFF: Lab: 4.5

## 2017-08-11 LAB — TSH WITH FREE T4 REFLEX
Lab: 0.1 — ABNORMAL LOW (ref 0.35–4.94)
Lab: 1.6 — ABNORMAL HIGH (ref 0.70–1.48)

## 2017-08-12 ENCOUNTER — Ambulatory Visit: Admit: 2017-08-12 | Discharge: 2017-08-13 | Payer: MEDICARE

## 2017-08-12 ENCOUNTER — Encounter: Admit: 2017-08-12 | Discharge: 2017-08-12 | Payer: MEDICARE

## 2017-08-12 DIAGNOSIS — M1990 Unspecified osteoarthritis, unspecified site: ICD-10-CM

## 2017-08-12 DIAGNOSIS — E039 Hypothyroidism, unspecified: ICD-10-CM

## 2017-08-12 DIAGNOSIS — D699 Hemorrhagic condition, unspecified: ICD-10-CM

## 2017-08-12 DIAGNOSIS — K319 Disease of stomach and duodenum, unspecified: ICD-10-CM

## 2017-08-12 DIAGNOSIS — I25119 Atherosclerotic heart disease of native coronary artery with unspecified angina pectoris: ICD-10-CM

## 2017-08-12 DIAGNOSIS — E119 Type 2 diabetes mellitus without complications: ICD-10-CM

## 2017-08-12 DIAGNOSIS — H547 Unspecified visual loss: ICD-10-CM

## 2017-08-12 DIAGNOSIS — I2510 Atherosclerotic heart disease of native coronary artery without angina pectoris: ICD-10-CM

## 2017-08-12 DIAGNOSIS — E785 Hyperlipidemia, unspecified: ICD-10-CM

## 2017-08-12 DIAGNOSIS — I10 Essential (primary) hypertension: ICD-10-CM

## 2017-08-12 DIAGNOSIS — I480 Paroxysmal atrial fibrillation: ICD-10-CM

## 2017-08-12 DIAGNOSIS — M549 Dorsalgia, unspecified: ICD-10-CM

## 2017-08-12 DIAGNOSIS — I5032 Chronic diastolic (congestive) heart failure: ICD-10-CM

## 2017-08-13 DIAGNOSIS — I519 Heart disease, unspecified: Secondary | ICD-10-CM

## 2017-08-13 DIAGNOSIS — I5032 Chronic diastolic (congestive) heart failure: Principal | ICD-10-CM

## 2017-08-15 ENCOUNTER — Encounter: Admit: 2017-08-15 | Discharge: 2017-08-15 | Payer: MEDICARE

## 2017-08-21 ENCOUNTER — Encounter: Admit: 2017-08-21 | Discharge: 2017-08-21 | Payer: MEDICARE

## 2017-08-22 ENCOUNTER — Encounter: Admit: 2017-08-22 | Discharge: 2017-08-22 | Payer: MEDICARE

## 2017-08-26 ENCOUNTER — Ambulatory Visit: Admit: 2017-08-26 | Discharge: 2017-08-26 | Payer: MEDICARE

## 2017-08-26 ENCOUNTER — Encounter: Admit: 2017-08-26 | Discharge: 2017-08-26 | Payer: MEDICARE

## 2017-08-26 DIAGNOSIS — I2510 Atherosclerotic heart disease of native coronary artery without angina pectoris: Secondary | ICD-10-CM

## 2017-08-26 DIAGNOSIS — E119 Type 2 diabetes mellitus without complications: ICD-10-CM

## 2017-08-26 DIAGNOSIS — Z951 Presence of aortocoronary bypass graft: ICD-10-CM

## 2017-08-26 DIAGNOSIS — K3189 Other diseases of stomach and duodenum: ICD-10-CM

## 2017-08-26 DIAGNOSIS — K7460 Unspecified cirrhosis of liver: ICD-10-CM

## 2017-08-26 DIAGNOSIS — I10 Essential (primary) hypertension: Secondary | ICD-10-CM

## 2017-08-26 DIAGNOSIS — Z87891 Personal history of nicotine dependence: ICD-10-CM

## 2017-08-26 DIAGNOSIS — Z794 Long term (current) use of insulin: ICD-10-CM

## 2017-08-26 DIAGNOSIS — Z955 Presence of coronary angioplasty implant and graft: ICD-10-CM

## 2017-08-26 DIAGNOSIS — I5032 Chronic diastolic (congestive) heart failure: ICD-10-CM

## 2017-08-26 DIAGNOSIS — K6289 Other specified diseases of anus and rectum: ICD-10-CM

## 2017-08-26 DIAGNOSIS — Z86010 Personal history of colonic polyps: ICD-10-CM

## 2017-08-26 DIAGNOSIS — Z882 Allergy status to sulfonamides status: ICD-10-CM

## 2017-08-26 DIAGNOSIS — I868 Varicose veins of other specified sites: ICD-10-CM

## 2017-08-26 DIAGNOSIS — K766 Portal hypertension: ICD-10-CM

## 2017-08-26 DIAGNOSIS — Z885 Allergy status to narcotic agent status: ICD-10-CM

## 2017-08-26 DIAGNOSIS — E039 Hypothyroidism, unspecified: ICD-10-CM

## 2017-08-26 DIAGNOSIS — Z881 Allergy status to other antibiotic agents status: ICD-10-CM

## 2017-08-26 DIAGNOSIS — D122 Benign neoplasm of ascending colon: ICD-10-CM

## 2017-08-26 DIAGNOSIS — D689 Coagulation defect, unspecified: ICD-10-CM

## 2017-08-26 DIAGNOSIS — E669 Obesity, unspecified: ICD-10-CM

## 2017-08-26 DIAGNOSIS — D500 Iron deficiency anemia secondary to blood loss (chronic): Principal | ICD-10-CM

## 2017-08-26 DIAGNOSIS — Z79899 Other long term (current) drug therapy: ICD-10-CM

## 2017-08-26 DIAGNOSIS — I8510 Secondary esophageal varices without bleeding: ICD-10-CM

## 2017-08-26 DIAGNOSIS — K31819 Angiodysplasia of stomach and duodenum without bleeding: ICD-10-CM

## 2017-08-26 DIAGNOSIS — D699 Hemorrhagic condition, unspecified: ICD-10-CM

## 2017-08-26 DIAGNOSIS — R748 Abnormal levels of other serum enzymes: Principal | ICD-10-CM

## 2017-08-26 DIAGNOSIS — D125 Benign neoplasm of sigmoid colon: ICD-10-CM

## 2017-08-26 DIAGNOSIS — D124 Benign neoplasm of descending colon: ICD-10-CM

## 2017-08-26 DIAGNOSIS — M549 Dorsalgia, unspecified: ICD-10-CM

## 2017-08-26 DIAGNOSIS — H547 Unspecified visual loss: ICD-10-CM

## 2017-08-26 DIAGNOSIS — M1990 Unspecified osteoarthritis, unspecified site: ICD-10-CM

## 2017-08-26 DIAGNOSIS — K319 Disease of stomach and duodenum, unspecified: ICD-10-CM

## 2017-08-26 LAB — CBC AND DIFF
Lab: 10
Lab: 14
Lab: 25 — ABNORMAL LOW (ref 35–52)
Lab: 8.1 — ABNORMAL LOW (ref 11.5–16.0)
Lab: 115 — ABNORMAL LOW (ref 130–400)
Lab: 2.6 — ABNORMAL LOW (ref 4.35–5.85)
Lab: 3.1 — ABNORMAL LOW (ref 4.3–11.0)
Lab: 31 — ABNORMAL HIGH (ref 0.60–1.30)
Lab: 33
Lab: 50
Lab: 96 — ABNORMAL HIGH (ref 7–18)

## 2017-08-26 LAB — POC GLUCOSE: Lab: 120 mg/dL — ABNORMAL HIGH (ref 70–100)

## 2017-08-26 LAB — COMPREHENSIVE METABOLIC PANEL: Lab: 140

## 2017-08-26 MED ORDER — FENTANYL CITRATE (PF) 50 MCG/ML IJ SOLN
0 refills | Status: DC
Start: 2017-08-26 — End: 2017-08-26
  Administered 2017-08-26: 13:00:00 50 ug via INTRAVENOUS
  Administered 2017-08-26: 14:00:00 25 ug via INTRAVENOUS

## 2017-08-26 MED ORDER — DIPHENHYDRAMINE HCL 50 MG/ML IJ SOLN
25 mg | Freq: Once | INTRAVENOUS | 0 refills | Status: CN | PRN
Start: 2017-08-26 — End: ?

## 2017-08-26 MED ORDER — PROPOFOL 10 MG/ML IV EMUL 20 ML (INFUSION)(AM)(OR)
INTRAVENOUS | 0 refills | Status: DC
Start: 2017-08-26 — End: 2017-08-26
  Administered 2017-08-26: 14:00:00 20.000 mL via INTRAVENOUS
  Administered 2017-08-26: 13:00:00 80 ug/kg/min via INTRAVENOUS
  Administered 2017-08-26 (×2): 20.000 mL via INTRAVENOUS

## 2017-08-26 MED ORDER — FENTANYL CITRATE (PF) 50 MCG/ML IJ SOLN
50 ug | INTRAVENOUS | 0 refills | Status: CN | PRN
Start: 2017-08-26 — End: ?

## 2017-08-26 MED ORDER — MIDAZOLAM 1 MG/ML IJ SOLN
INTRAVENOUS | 0 refills | Status: DC
Start: 2017-08-26 — End: 2017-08-26
  Administered 2017-08-26: 13:00:00 1 mg via INTRAVENOUS
  Administered 2017-08-26 (×2): 0.5 mg via INTRAVENOUS

## 2017-08-26 MED ORDER — LIDOCAINE (PF) 200 MG/10 ML (2 %) IJ SYRG
0 refills | Status: DC
Start: 2017-08-26 — End: 2017-08-26
  Administered 2017-08-26: 13:00:00 40 mg via INTRAVENOUS

## 2017-08-26 MED ORDER — HALOPERIDOL LACTATE 5 MG/ML IJ SOLN
1 mg | Freq: Once | INTRAVENOUS | 0 refills | Status: CN | PRN
Start: 2017-08-26 — End: ?

## 2017-08-26 MED ORDER — ONDANSETRON HCL (PF) 4 MG/2 ML IJ SOLN
0 refills | Status: DC
Start: 2017-08-26 — End: 2017-08-26
  Administered 2017-08-26: 14:00:00 4 mg via INTRAVENOUS

## 2017-08-26 MED ORDER — ACETAMINOPHEN 500 MG PO TAB
1000 mg | Freq: Once | ORAL | 0 refills | Status: DC
Start: 2017-08-26 — End: 2017-08-26

## 2017-08-26 MED ORDER — EPHEDRINE SULFATE 50 MG/5ML SYR (10 MG/ML) (AN)(OSM)
0 refills | Status: DC
Start: 2017-08-26 — End: 2017-08-26
  Administered 2017-08-26 (×2): 10 mg via INTRAVENOUS

## 2017-08-26 MED ORDER — GLYCOPYRROLATE 0.2 MG/ML IJ SOLN
0 refills | Status: DC
Start: 2017-08-26 — End: 2017-08-26
  Administered 2017-08-26: 14:00:00 .15 mg via INTRAVENOUS

## 2017-08-26 MED ORDER — LACTATED RINGERS IV SOLP
1000 mL | INTRAVENOUS | 0 refills | Status: DC
Start: 2017-08-26 — End: 2017-08-26
  Administered 2017-08-26: 13:00:00 1000 mL via INTRAVENOUS

## 2017-08-27 ENCOUNTER — Encounter: Admit: 2017-08-27 | Discharge: 2017-08-27 | Payer: MEDICARE

## 2017-08-27 DIAGNOSIS — H547 Unspecified visual loss: ICD-10-CM

## 2017-08-27 DIAGNOSIS — I10 Essential (primary) hypertension: ICD-10-CM

## 2017-08-27 DIAGNOSIS — K319 Disease of stomach and duodenum, unspecified: ICD-10-CM

## 2017-08-27 DIAGNOSIS — M549 Dorsalgia, unspecified: ICD-10-CM

## 2017-08-27 DIAGNOSIS — M1990 Unspecified osteoarthritis, unspecified site: ICD-10-CM

## 2017-08-27 DIAGNOSIS — D699 Hemorrhagic condition, unspecified: ICD-10-CM

## 2017-08-27 DIAGNOSIS — I5032 Chronic diastolic (congestive) heart failure: ICD-10-CM

## 2017-08-27 DIAGNOSIS — E119 Type 2 diabetes mellitus without complications: ICD-10-CM

## 2017-08-27 DIAGNOSIS — I2510 Atherosclerotic heart disease of native coronary artery without angina pectoris: Principal | ICD-10-CM

## 2017-08-27 DIAGNOSIS — E039 Hypothyroidism, unspecified: ICD-10-CM

## 2017-09-02 ENCOUNTER — Encounter: Admit: 2017-09-02 | Discharge: 2017-09-02 | Payer: MEDICARE

## 2017-09-03 MED ORDER — POTASSIUM CHLORIDE 20 MEQ PO TBTQ
20 meq | ORAL_TABLET | Freq: Two times a day (BID) | ORAL | 3 refills | 30.00000 days | Status: AC
Start: 2017-09-03 — End: 2017-09-18

## 2017-09-04 ENCOUNTER — Encounter: Admit: 2017-09-04 | Discharge: 2017-09-04 | Payer: MEDICARE

## 2017-09-04 DIAGNOSIS — M549 Dorsalgia, unspecified: ICD-10-CM

## 2017-09-04 DIAGNOSIS — M1990 Unspecified osteoarthritis, unspecified site: ICD-10-CM

## 2017-09-04 DIAGNOSIS — I10 Essential (primary) hypertension: ICD-10-CM

## 2017-09-04 DIAGNOSIS — I5032 Chronic diastolic (congestive) heart failure: ICD-10-CM

## 2017-09-04 DIAGNOSIS — K319 Disease of stomach and duodenum, unspecified: ICD-10-CM

## 2017-09-04 DIAGNOSIS — E039 Hypothyroidism, unspecified: ICD-10-CM

## 2017-09-04 DIAGNOSIS — E119 Type 2 diabetes mellitus without complications: ICD-10-CM

## 2017-09-04 DIAGNOSIS — I2510 Atherosclerotic heart disease of native coronary artery without angina pectoris: Principal | ICD-10-CM

## 2017-09-04 DIAGNOSIS — D699 Hemorrhagic condition, unspecified: ICD-10-CM

## 2017-09-04 DIAGNOSIS — H547 Unspecified visual loss: ICD-10-CM

## 2017-09-05 ENCOUNTER — Encounter: Admit: 2017-09-05 | Discharge: 2017-09-05 | Payer: MEDICARE

## 2017-09-05 DIAGNOSIS — I5032 Chronic diastolic (congestive) heart failure: Principal | ICD-10-CM

## 2017-09-05 LAB — BASIC METABOLIC PANEL
Lab: 139
Lab: 23 — AB (ref 7–18)
Lab: 27
Lab: 104
Lab: 152 — AB (ref 70–105)
Lab: 2.4 — AB (ref 0.6–1.3)
Lab: 21 — AB (ref >=60)
Lab: 4.9
Lab: 8
Lab: 9.2

## 2017-09-05 MED ORDER — BUMETANIDE 1 MG PO TAB
ORAL_TABLET | Freq: Two times a day (BID) | 3 refills
Start: 2017-09-05 — End: ?

## 2017-09-08 ENCOUNTER — Encounter: Admit: 2017-09-08 | Discharge: 2017-09-08 | Payer: MEDICARE

## 2017-09-10 LAB — BASIC METABOLIC PANEL
Lab: 108
Lab: 138
Lab: 24
Lab: 5

## 2017-09-11 ENCOUNTER — Encounter: Admit: 2017-09-11 | Discharge: 2017-09-11 | Payer: MEDICARE

## 2017-09-15 ENCOUNTER — Encounter: Admit: 2017-09-15 | Discharge: 2017-09-15 | Payer: MEDICARE

## 2017-09-17 ENCOUNTER — Encounter: Admit: 2017-09-17 | Discharge: 2017-09-17 | Payer: MEDICARE

## 2017-09-17 DIAGNOSIS — I5032 Chronic diastolic (congestive) heart failure: Principal | ICD-10-CM

## 2017-09-18 ENCOUNTER — Encounter: Admit: 2017-09-18 | Discharge: 2017-09-18 | Payer: MEDICARE

## 2017-09-18 ENCOUNTER — Ambulatory Visit: Admit: 2017-09-18 | Discharge: 2017-09-19 | Payer: MEDICARE

## 2017-09-18 DIAGNOSIS — E119 Type 2 diabetes mellitus without complications: ICD-10-CM

## 2017-09-18 DIAGNOSIS — I10 Essential (primary) hypertension: Secondary | ICD-10-CM

## 2017-09-18 DIAGNOSIS — D699 Hemorrhagic condition, unspecified: ICD-10-CM

## 2017-09-18 DIAGNOSIS — K319 Disease of stomach and duodenum, unspecified: ICD-10-CM

## 2017-09-18 DIAGNOSIS — M549 Dorsalgia, unspecified: ICD-10-CM

## 2017-09-18 DIAGNOSIS — I2510 Atherosclerotic heart disease of native coronary artery without angina pectoris: Principal | ICD-10-CM

## 2017-09-18 DIAGNOSIS — M1990 Unspecified osteoarthritis, unspecified site: ICD-10-CM

## 2017-09-18 DIAGNOSIS — E039 Hypothyroidism, unspecified: ICD-10-CM

## 2017-09-18 DIAGNOSIS — I5032 Chronic diastolic (congestive) heart failure: ICD-10-CM

## 2017-09-18 DIAGNOSIS — H547 Unspecified visual loss: ICD-10-CM

## 2017-09-18 MED ORDER — BUMETANIDE 1 MG PO TAB
2 mg | ORAL_TABLET | Freq: Every day | ORAL | 2 refills | Status: AC
Start: 2017-09-18 — End: 2017-10-17

## 2017-09-19 ENCOUNTER — Encounter: Admit: 2017-09-19 | Discharge: 2017-09-19 | Payer: MEDICARE

## 2017-09-19 DIAGNOSIS — I130 Hypertensive heart and chronic kidney disease with heart failure and stage 1 through stage 4 chronic kidney disease, or unspecified chronic kidney disease: ICD-10-CM

## 2017-09-19 DIAGNOSIS — I5032 Chronic diastolic (congestive) heart failure: Principal | ICD-10-CM

## 2017-09-19 DIAGNOSIS — N184 Chronic kidney disease, stage 4 (severe): Secondary | ICD-10-CM

## 2017-09-19 LAB — COMPREHENSIVE METABOLIC PANEL
Lab: 0
Lab: 0.7
Lab: 1.4
Lab: 10
Lab: 11
Lab: 139
Lab: 17
Lab: 21
Lab: 3.4
Lab: 35
Lab: 57
Lab: 6.9
Lab: 9.3
Lab: 108
Lab: 139
Lab: 4.9

## 2017-09-19 LAB — CBC
Lab: 2.7
Lab: 4.7
Lab: 8.3
Lab: 10
Lab: 32

## 2017-09-22 ENCOUNTER — Encounter: Admit: 2017-09-22 | Discharge: 2017-09-22 | Payer: MEDICARE

## 2017-09-22 LAB — BASIC METABOLIC PANEL
Lab: 107 mL/min (ref 98–107)
Lab: 138 10*3/uL — ABNORMAL HIGH (ref 135–145)
Lab: 2 — ABNORMAL HIGH (ref 0.60–1.30)
Lab: 24
Lab: 24 10*3/uL (ref 21–32)
Lab: 259 — ABNORMAL HIGH (ref 70–105)
Lab: 26 10*3/uL — ABNORMAL HIGH (ref 7–18)
Lab: 4.7 mL/min (ref 3.6–5.0)
Lab: 7 (ref 5–14)
Lab: 9.8 (ref 8.5–10.1)

## 2017-09-25 ENCOUNTER — Encounter: Admit: 2017-09-25 | Discharge: 2017-09-25 | Payer: MEDICARE

## 2017-09-25 DIAGNOSIS — I5032 Chronic diastolic (congestive) heart failure: Principal | ICD-10-CM

## 2017-09-26 ENCOUNTER — Ambulatory Visit: Admit: 2017-09-26 | Discharge: 2017-09-27 | Payer: MEDICARE

## 2017-09-26 MED ORDER — DIGOXIN 125 MCG PO TAB
125 ug | ORAL_TABLET | ORAL | 1 refills | 30.00000 days | Status: AC
Start: 2017-09-26 — End: 2018-02-24

## 2017-09-27 DIAGNOSIS — I519 Heart disease, unspecified: Principal | ICD-10-CM

## 2017-09-27 DIAGNOSIS — I5032 Chronic diastolic (congestive) heart failure: ICD-10-CM

## 2017-10-01 ENCOUNTER — Encounter: Admit: 2017-10-01 | Discharge: 2017-10-01 | Payer: MEDICARE

## 2017-10-01 LAB — BASIC METABOLIC PANEL
Lab: 11
Lab: 18

## 2017-10-13 LAB — COMPREHENSIVE METABOLIC PANEL
Lab: 0.5
Lab: 16
Lab: 17
Lab: 2.8 — ABNORMAL HIGH (ref 0.60–1.30)
Lab: 3.2
Lab: 6.5 — ABNORMAL HIGH (ref 10.0–14.5)
Lab: 9
Lab: 138 10*3/uL — ABNORMAL LOW (ref 4.35–5.85)
Lab: 140 — ABNORMAL HIGH (ref 70–105)
Lab: 23
Lab: 31 — ABNORMAL HIGH (ref 7–18)
Lab: 56
Lab: 8
Lab: 9.2

## 2017-10-13 LAB — CBC AND DIFF: Lab: 5.4 mL/min (ref >60)

## 2017-10-13 LAB — BNP (B-TYPE NATRIURETIC PEPTI): Lab: 363 — ABNORMAL HIGH (ref <100.0)

## 2017-10-15 ENCOUNTER — Encounter: Admit: 2017-10-15 | Discharge: 2017-10-15 | Payer: MEDICARE

## 2017-10-17 ENCOUNTER — Encounter: Admit: 2017-10-17 | Discharge: 2017-10-17 | Payer: MEDICARE

## 2017-10-17 ENCOUNTER — Ambulatory Visit: Admit: 2017-10-17 | Discharge: 2017-10-17 | Payer: MEDICARE

## 2017-10-17 DIAGNOSIS — I5032 Chronic diastolic (congestive) heart failure: ICD-10-CM

## 2017-10-17 DIAGNOSIS — I2510 Atherosclerotic heart disease of native coronary artery without angina pectoris: Principal | ICD-10-CM

## 2017-10-17 DIAGNOSIS — M549 Dorsalgia, unspecified: ICD-10-CM

## 2017-10-17 DIAGNOSIS — I10 Essential (primary) hypertension: ICD-10-CM

## 2017-10-17 DIAGNOSIS — K922 Gastrointestinal hemorrhage, unspecified: ICD-10-CM

## 2017-10-17 DIAGNOSIS — E039 Hypothyroidism, unspecified: ICD-10-CM

## 2017-10-17 DIAGNOSIS — M1990 Unspecified osteoarthritis, unspecified site: ICD-10-CM

## 2017-10-17 DIAGNOSIS — K319 Disease of stomach and duodenum, unspecified: ICD-10-CM

## 2017-10-17 DIAGNOSIS — H547 Unspecified visual loss: ICD-10-CM

## 2017-10-17 DIAGNOSIS — E119 Type 2 diabetes mellitus without complications: ICD-10-CM

## 2017-10-17 DIAGNOSIS — N184 Chronic kidney disease, stage 4 (severe): ICD-10-CM

## 2017-10-17 DIAGNOSIS — D699 Hemorrhagic condition, unspecified: ICD-10-CM

## 2017-10-17 LAB — CBC AND DIFF
Lab: 0 10*3/uL (ref 0–0.20)
Lab: 0.4 10*3/uL (ref 0–0.45)
Lab: 0.5 10*3/uL (ref 0–0.80)
Lab: 1 % (ref 0–2)
Lab: 1.2 10*3/uL (ref 1.0–4.8)
Lab: 16 % — ABNORMAL HIGH (ref >60)
Lab: 17 % — ABNORMAL LOW (ref 24–44)
Lab: 2.8 M/UL — ABNORMAL LOW (ref 4.0–5.0)
Lab: 211 10*3/uL — ABNORMAL LOW (ref >60)
Lab: 26 % — ABNORMAL LOW (ref 36–45)
Lab: 31 pg — ABNORMAL HIGH (ref 26–34)
Lab: 34 g/dL (ref 32.0–36.0)
Lab: 5.1 10*3/uL (ref 1.8–7.0)
Lab: 6 % (ref 4–12)
Lab: 6 % — ABNORMAL HIGH (ref 0–5)
Lab: 7.2 10*3/uL (ref 4.5–11.0)
Lab: 7.9 FL (ref 7–11)
Lab: 70 % (ref 41–77)
Lab: 9.2 g/dL — ABNORMAL LOW (ref 12.0–15.0)
Lab: 92 FL — ABNORMAL HIGH (ref 80–100)

## 2017-10-17 LAB — BASIC METABOLIC PANEL
Lab: 136 MMOL/L — ABNORMAL LOW (ref 137–147)
Lab: 3.8 MMOL/L (ref 3.5–5.1)

## 2017-10-17 MED ORDER — BUMETANIDE 1 MG PO TAB
1 mg | ORAL_TABLET | Freq: Every day | ORAL | 2 refills | Status: AC
Start: 2017-10-17 — End: 2018-02-24

## 2017-10-20 ENCOUNTER — Encounter: Admit: 2017-10-20 | Discharge: 2017-10-20 | Payer: MEDICARE

## 2017-10-20 DIAGNOSIS — K922 Gastrointestinal hemorrhage, unspecified: Principal | ICD-10-CM

## 2017-10-21 LAB — CBC
Lab: 2.7 — ABNORMAL LOW (ref 4.35–5.85)
Lab: 25 — ABNORMAL LOW (ref 35–52)
Lab: 5.9 (ref 4.3–11)
Lab: 8.3 — ABNORMAL LOW (ref 11.5–16)
Lab: 91 (ref 80–99)

## 2017-10-21 LAB — MAGNESIUM: Lab: 1.6 — ABNORMAL LOW (ref 1.8–2.4)

## 2017-10-22 ENCOUNTER — Encounter: Admit: 2017-10-22 | Discharge: 2017-10-22 | Payer: MEDICARE

## 2017-10-22 DIAGNOSIS — I5032 Chronic diastolic (congestive) heart failure: Principal | ICD-10-CM

## 2017-10-22 LAB — BASIC METABOLIC PANEL
Lab: 143 — ABNORMAL HIGH (ref 70–105)
Lab: 19 — ABNORMAL LOW (ref 21–32)
Lab: 3.8 (ref 3.6–5.0)
Lab: 102 (ref 98–107)
Lab: 136 10*3/uL (ref 135–145)
Lab: 36 — ABNORMAL HIGH (ref 7–18)

## 2017-10-24 ENCOUNTER — Encounter: Admit: 2017-10-24 | Discharge: 2017-10-24 | Payer: MEDICARE

## 2017-10-28 LAB — COMPREHENSIVE METABOLIC PANEL
Lab: 0.5
Lab: 1.6 — ABNORMAL HIGH (ref 0.6–1.3)
Lab: 10
Lab: 10
Lab: 121 — ABNORMAL HIGH (ref 70–105)
Lab: 22 — ABNORMAL HIGH (ref 7–18)
Lab: 24
Lab: 3.4
Lab: 32
Lab: 50
Lab: 7.1
Lab: 9.3

## 2017-10-29 ENCOUNTER — Encounter: Admit: 2017-10-29 | Discharge: 2017-10-29 | Payer: MEDICARE

## 2017-10-29 ENCOUNTER — Ambulatory Visit: Admit: 2017-10-29 | Discharge: 2017-10-29 | Payer: MEDICARE

## 2017-10-29 DIAGNOSIS — E119 Type 2 diabetes mellitus without complications: ICD-10-CM

## 2017-10-29 DIAGNOSIS — K319 Disease of stomach and duodenum, unspecified: ICD-10-CM

## 2017-10-29 DIAGNOSIS — E039 Hypothyroidism, unspecified: ICD-10-CM

## 2017-10-29 DIAGNOSIS — I2510 Atherosclerotic heart disease of native coronary artery without angina pectoris: Secondary | ICD-10-CM

## 2017-10-29 DIAGNOSIS — M549 Dorsalgia, unspecified: ICD-10-CM

## 2017-10-29 DIAGNOSIS — I5032 Chronic diastolic (congestive) heart failure: ICD-10-CM

## 2017-10-29 DIAGNOSIS — H547 Unspecified visual loss: ICD-10-CM

## 2017-10-29 DIAGNOSIS — D699 Hemorrhagic condition, unspecified: ICD-10-CM

## 2017-10-29 DIAGNOSIS — I10 Essential (primary) hypertension: Secondary | ICD-10-CM

## 2017-10-29 DIAGNOSIS — D500 Iron deficiency anemia secondary to blood loss (chronic): Principal | ICD-10-CM

## 2017-10-29 DIAGNOSIS — M1990 Unspecified osteoarthritis, unspecified site: ICD-10-CM

## 2017-10-31 ENCOUNTER — Encounter: Admit: 2017-10-31 | Discharge: 2017-10-31 | Payer: MEDICARE

## 2017-11-05 ENCOUNTER — Encounter: Admit: 2017-11-05 | Discharge: 2017-11-05 | Payer: MEDICARE

## 2017-11-06 ENCOUNTER — Encounter: Admit: 2017-11-06 | Discharge: 2017-11-06 | Payer: MEDICARE

## 2017-11-06 DIAGNOSIS — I10 Essential (primary) hypertension: Secondary | ICD-10-CM

## 2017-11-06 DIAGNOSIS — I2510 Atherosclerotic heart disease of native coronary artery without angina pectoris: ICD-10-CM

## 2017-11-06 DIAGNOSIS — M549 Dorsalgia, unspecified: ICD-10-CM

## 2017-11-06 DIAGNOSIS — E119 Type 2 diabetes mellitus without complications: ICD-10-CM

## 2017-11-06 DIAGNOSIS — M1990 Unspecified osteoarthritis, unspecified site: ICD-10-CM

## 2017-11-06 DIAGNOSIS — E039 Hypothyroidism, unspecified: ICD-10-CM

## 2017-11-06 DIAGNOSIS — H547 Unspecified visual loss: ICD-10-CM

## 2017-11-06 DIAGNOSIS — D699 Hemorrhagic condition, unspecified: ICD-10-CM

## 2017-11-06 DIAGNOSIS — I5032 Chronic diastolic (congestive) heart failure: ICD-10-CM

## 2017-11-06 DIAGNOSIS — K319 Disease of stomach and duodenum, unspecified: ICD-10-CM

## 2017-11-12 ENCOUNTER — Encounter: Admit: 2017-11-12 | Discharge: 2017-11-12 | Payer: MEDICARE

## 2017-11-18 ENCOUNTER — Encounter: Admit: 2017-11-18 | Discharge: 2017-11-18 | Payer: MEDICARE

## 2017-11-18 ENCOUNTER — Ambulatory Visit: Admit: 2017-11-18 | Discharge: 2017-11-19 | Payer: MEDICARE

## 2017-11-18 DIAGNOSIS — M549 Dorsalgia, unspecified: ICD-10-CM

## 2017-11-18 DIAGNOSIS — I10 Essential (primary) hypertension: ICD-10-CM

## 2017-11-18 DIAGNOSIS — D699 Hemorrhagic condition, unspecified: ICD-10-CM

## 2017-11-18 DIAGNOSIS — E039 Hypothyroidism, unspecified: ICD-10-CM

## 2017-11-18 DIAGNOSIS — E119 Type 2 diabetes mellitus without complications: ICD-10-CM

## 2017-11-18 DIAGNOSIS — I519 Heart disease, unspecified: Secondary | ICD-10-CM

## 2017-11-18 DIAGNOSIS — H547 Unspecified visual loss: ICD-10-CM

## 2017-11-18 DIAGNOSIS — K319 Disease of stomach and duodenum, unspecified: ICD-10-CM

## 2017-11-18 DIAGNOSIS — I5032 Chronic diastolic (congestive) heart failure: ICD-10-CM

## 2017-11-18 DIAGNOSIS — M1990 Unspecified osteoarthritis, unspecified site: ICD-10-CM

## 2017-11-18 DIAGNOSIS — I2510 Atherosclerotic heart disease of native coronary artery without angina pectoris: Secondary | ICD-10-CM

## 2017-11-18 MED ORDER — METOPROLOL TARTRATE 25 MG PO TAB
25 mg | ORAL_TABLET | Freq: Two times a day (BID) | ORAL | 3 refills | 90.00000 days | Status: AC
Start: 2017-11-18 — End: 2018-02-24

## 2017-11-19 DIAGNOSIS — E785 Hyperlipidemia, unspecified: ICD-10-CM

## 2017-11-19 DIAGNOSIS — I5032 Chronic diastolic (congestive) heart failure: Principal | ICD-10-CM

## 2017-11-19 LAB — COMPREHENSIVE METABOLIC PANEL
Lab: 10
Lab: 10
Lab: 136 mg/dL (ref <150)
Lab: 4.4 mg/dL (ref >40)

## 2017-11-19 LAB — CBC
Lab: 129 — ABNORMAL LOW (ref 130–400)
Lab: 18 — ABNORMAL HIGH (ref 10.0–14.5)
Lab: 2.7 — ABNORMAL LOW (ref 4.35–5.85)
Lab: 24 — ABNORMAL LOW (ref 35–52)
Lab: 29
Lab: 3.2 — ABNORMAL LOW (ref 4.3–11.0)
Lab: 33
Lab: 7.9 — ABNORMAL LOW (ref 11.5–16.0)
Lab: 9.9
Lab: 90

## 2017-11-19 LAB — BNP (B-TYPE NATRIURETIC PEPTI): Lab: 388 K/UL — ABNORMAL HIGH (ref <100.0)

## 2017-11-19 LAB — TSH WITH FREE T4 REFLEX
Lab: 0.8
Lab: 12 — ABNORMAL HIGH (ref 0.35–4.94)

## 2017-11-19 LAB — MAGNESIUM: Lab: 2.3

## 2017-11-19 LAB — DIGOXIN LEVEL: Lab: 0.4 — ABNORMAL LOW (ref 0.80–2.00)

## 2017-11-20 ENCOUNTER — Encounter: Admit: 2017-11-20 | Discharge: 2017-11-20 | Payer: MEDICARE

## 2017-11-20 DIAGNOSIS — I5032 Chronic diastolic (congestive) heart failure: ICD-10-CM

## 2017-11-20 DIAGNOSIS — I519 Heart disease, unspecified: Principal | ICD-10-CM

## 2017-11-27 ENCOUNTER — Encounter: Admit: 2017-11-27 | Discharge: 2017-11-27 | Payer: MEDICARE

## 2017-12-22 ENCOUNTER — Encounter: Admit: 2017-12-22 | Discharge: 2017-12-22 | Payer: MEDICARE

## 2017-12-22 DIAGNOSIS — I5032 Chronic diastolic (congestive) heart failure: ICD-10-CM

## 2017-12-22 DIAGNOSIS — I480 Paroxysmal atrial fibrillation: Principal | ICD-10-CM

## 2017-12-23 ENCOUNTER — Encounter: Admit: 2017-12-23 | Discharge: 2017-12-23 | Payer: MEDICARE

## 2017-12-23 DIAGNOSIS — D699 Hemorrhagic condition, unspecified: ICD-10-CM

## 2017-12-23 DIAGNOSIS — H547 Unspecified visual loss: ICD-10-CM

## 2017-12-23 DIAGNOSIS — I10 Essential (primary) hypertension: Secondary | ICD-10-CM

## 2017-12-23 DIAGNOSIS — E119 Type 2 diabetes mellitus without complications: ICD-10-CM

## 2017-12-23 DIAGNOSIS — M1990 Unspecified osteoarthritis, unspecified site: ICD-10-CM

## 2017-12-23 DIAGNOSIS — K319 Disease of stomach and duodenum, unspecified: ICD-10-CM

## 2017-12-23 DIAGNOSIS — I5032 Chronic diastolic (congestive) heart failure: ICD-10-CM

## 2017-12-23 DIAGNOSIS — E039 Hypothyroidism, unspecified: ICD-10-CM

## 2017-12-23 DIAGNOSIS — I2510 Atherosclerotic heart disease of native coronary artery without angina pectoris: ICD-10-CM

## 2017-12-23 DIAGNOSIS — M549 Dorsalgia, unspecified: ICD-10-CM

## 2017-12-24 ENCOUNTER — Ambulatory Visit: Admit: 2017-12-23 | Discharge: 2017-12-24 | Payer: MEDICARE

## 2017-12-24 DIAGNOSIS — Z951 Presence of aortocoronary bypass graft: ICD-10-CM

## 2017-12-24 DIAGNOSIS — I519 Heart disease, unspecified: Secondary | ICD-10-CM

## 2017-12-24 DIAGNOSIS — I2510 Atherosclerotic heart disease of native coronary artery without angina pectoris: ICD-10-CM

## 2017-12-24 DIAGNOSIS — I5032 Chronic diastolic (congestive) heart failure: Principal | ICD-10-CM

## 2017-12-29 LAB — CBC
Lab: 128 — ABNORMAL LOW (ref 130–400)
Lab: 15 — ABNORMAL HIGH (ref 10.0–14)
Lab: 2.9 — ABNORMAL LOW (ref 4.35–5.85)
Lab: 28 — ABNORMAL LOW (ref 35–52)
Lab: 3.3 — ABNORMAL LOW (ref 4.3–11.0)
Lab: 31
Lab: 32
Lab: 8.9 — ABNORMAL LOW (ref 11.5–16.0)
Lab: 9.3
Lab: 95

## 2017-12-29 LAB — BASIC METABOLIC PANEL
Lab: 103
Lab: 135
Lab: 4.7

## 2017-12-29 LAB — MAGNESIUM: Lab: 1.9

## 2017-12-30 ENCOUNTER — Encounter: Admit: 2017-12-30 | Discharge: 2017-12-30 | Payer: MEDICARE

## 2017-12-31 ENCOUNTER — Encounter: Admit: 2017-12-31 | Discharge: 2017-12-31 | Payer: MEDICARE

## 2018-01-12 LAB — BNP (B-TYPE NATRIURETIC PEPTI): Lab: 128 — ABNORMAL HIGH (ref <100)

## 2018-02-09 LAB — BASIC METABOLIC PANEL
Lab: 13
Lab: 133 — ABNORMAL LOW
Lab: 19
Lab: 2.5 — ABNORMAL HIGH
Lab: 25
Lab: 35 — ABNORMAL HIGH
Lab: 372 — ABNORMAL HIGH
Lab: 4.1
Lab: 9.9
Lab: 95 — ABNORMAL LOW

## 2018-02-19 ENCOUNTER — Encounter: Admit: 2018-02-19 | Discharge: 2018-02-19 | Payer: MEDICARE

## 2018-02-23 LAB — CBC AND DIFF
Lab: 0
Lab: 0
Lab: 0.2
Lab: 0.3
Lab: 0.9 — ABNORMAL LOW
Lab: 10
Lab: 118 — ABNORMAL LOW
Lab: 12
Lab: 2.2
Lab: 23
Lab: 28 — ABNORMAL LOW
Lab: 3 — ABNORMAL LOW
Lab: 3.7 — ABNORMAL LOW
Lab: 31
Lab: 34
Lab: 6
Lab: 61
Lab: 9
Lab: 9.5 — ABNORMAL LOW
Lab: 90

## 2018-02-23 LAB — MAGNESIUM: Lab: 2.1 K/UL (ref 150–400)

## 2018-02-23 LAB — BNP (B-TYPE NATRIURETIC PEPTI): Lab: 162 % — ABNORMAL HIGH (ref 41–77)

## 2018-02-24 ENCOUNTER — Ambulatory Visit: Admit: 2018-02-24 | Discharge: 2018-02-24 | Payer: MEDICARE

## 2018-02-24 ENCOUNTER — Encounter: Admit: 2018-02-24 | Discharge: 2018-02-24 | Payer: MEDICARE

## 2018-02-24 ENCOUNTER — Ambulatory Visit: Admit: 2018-02-24 | Discharge: 2018-02-25 | Payer: MEDICARE

## 2018-02-24 DIAGNOSIS — I5032 Chronic diastolic (congestive) heart failure: ICD-10-CM

## 2018-02-24 DIAGNOSIS — I10 Essential (primary) hypertension: Secondary | ICD-10-CM

## 2018-02-24 DIAGNOSIS — I5033 Acute on chronic diastolic (congestive) heart failure: Principal | ICD-10-CM

## 2018-02-24 DIAGNOSIS — E1159 Type 2 diabetes mellitus with other circulatory complications: ICD-10-CM

## 2018-02-24 DIAGNOSIS — K22719 Barrett's esophagus with dysplasia, unspecified: ICD-10-CM

## 2018-02-24 DIAGNOSIS — E039 Hypothyroidism, unspecified: ICD-10-CM

## 2018-02-24 DIAGNOSIS — K319 Disease of stomach and duodenum, unspecified: ICD-10-CM

## 2018-02-24 DIAGNOSIS — D126 Benign neoplasm of colon, unspecified: ICD-10-CM

## 2018-02-24 DIAGNOSIS — H547 Unspecified visual loss: ICD-10-CM

## 2018-02-24 DIAGNOSIS — E785 Hyperlipidemia, unspecified: ICD-10-CM

## 2018-02-24 DIAGNOSIS — N184 Chronic kidney disease, stage 4 (severe): ICD-10-CM

## 2018-02-24 DIAGNOSIS — K31819 Angiodysplasia of stomach and duodenum without bleeding: ICD-10-CM

## 2018-02-24 DIAGNOSIS — J90 Pleural effusion, not elsewhere classified: ICD-10-CM

## 2018-02-24 DIAGNOSIS — I2510 Atherosclerotic heart disease of native coronary artery without angina pectoris: ICD-10-CM

## 2018-02-24 DIAGNOSIS — K7290 Hepatic failure, unspecified without coma: ICD-10-CM

## 2018-02-24 DIAGNOSIS — D649 Anemia, unspecified: ICD-10-CM

## 2018-02-24 DIAGNOSIS — I25119 Atherosclerotic heart disease of native coronary artery with unspecified angina pectoris: ICD-10-CM

## 2018-02-24 DIAGNOSIS — K922 Gastrointestinal hemorrhage, unspecified: ICD-10-CM

## 2018-02-24 DIAGNOSIS — E119 Type 2 diabetes mellitus without complications: ICD-10-CM

## 2018-02-24 DIAGNOSIS — N179 Acute kidney failure, unspecified: ICD-10-CM

## 2018-02-24 DIAGNOSIS — D699 Hemorrhagic condition, unspecified: ICD-10-CM

## 2018-02-24 DIAGNOSIS — M1990 Unspecified osteoarthritis, unspecified site: ICD-10-CM

## 2018-02-24 DIAGNOSIS — K7460 Unspecified cirrhosis of liver: ICD-10-CM

## 2018-02-24 DIAGNOSIS — M549 Dorsalgia, unspecified: ICD-10-CM

## 2018-02-24 DIAGNOSIS — I519 Heart disease, unspecified: ICD-10-CM

## 2018-02-24 MED ORDER — BUMETANIDE 1 MG PO TAB
1 mg | ORAL_TABLET | ORAL | 2 refills | Status: AC
Start: 2018-02-24 — End: 2018-05-12

## 2018-02-24 MED ORDER — METOPROLOL TARTRATE 25 MG PO TAB
12.5 mg | ORAL_TABLET | Freq: Two times a day (BID) | ORAL | 3 refills | 90.00000 days | Status: AC
Start: 2018-02-24 — End: 2018-12-21

## 2018-02-24 MED ORDER — LACTULOSE 10 GRAM/15 ML PO SOLN
13.33 g | Freq: Two times a day (BID) | ORAL | 11 refills | 21.00000 days | Status: AC
Start: 2018-02-24 — End: 2018-11-26

## 2018-02-24 MED ORDER — GADOBENATE DIMEGLUMINE 529 MG/ML (0.1MMOL/0.2ML) IV SOLN
17 mL | Freq: Once | INTRAVENOUS | 0 refills | Status: CP
Start: 2018-02-24 — End: ?
  Administered 2018-02-24: 19:00:00 17 mL via INTRAVENOUS

## 2018-02-24 MED ORDER — SODIUM CHLORIDE 0.9 % IJ SOLN
50 mL | Freq: Once | INTRAVENOUS | 0 refills | Status: CP
Start: 2018-02-24 — End: ?
  Administered 2018-02-24: 19:00:00 50 mL via INTRAVENOUS

## 2018-02-27 ENCOUNTER — Encounter: Admit: 2018-02-27 | Discharge: 2018-02-27 | Payer: MEDICARE

## 2018-03-02 LAB — COMPREHENSIVE METABOLIC PANEL
Lab: 137
Lab: 4.4

## 2018-03-02 MED ORDER — DIGOXIN 125 MCG PO TAB
ORAL_TABLET | 1 refills
Start: 2018-03-02 — End: ?

## 2018-03-03 ENCOUNTER — Encounter: Admit: 2018-03-03 | Discharge: 2018-03-03 | Payer: MEDICARE

## 2018-03-03 DIAGNOSIS — N184 Chronic kidney disease, stage 4 (severe): ICD-10-CM

## 2018-03-03 DIAGNOSIS — I5032 Chronic diastolic (congestive) heart failure: Principal | ICD-10-CM

## 2018-03-09 LAB — CBC
Lab: 2.3 — ABNORMAL LOW (ref 4.35–5.85)
Lab: 23 — ABNORMAL LOW (ref 35–52)
Lab: 3.2 — ABNORMAL LOW (ref 4.3–11.0)
Lab: 7.4 — ABNORMAL LOW (ref 11.5–16.0)

## 2018-03-10 ENCOUNTER — Encounter: Admit: 2018-03-10 | Discharge: 2018-03-10 | Payer: MEDICARE

## 2018-03-11 ENCOUNTER — Encounter: Admit: 2018-03-11 | Discharge: 2018-03-11 | Payer: MEDICARE

## 2018-03-16 LAB — BNP (B-TYPE NATRIURETIC PEPTI): Lab: 215 MMOL/L — ABNORMAL HIGH (ref <100.0)

## 2018-03-16 LAB — COMPREHENSIVE METABOLIC PANEL
Lab: 109 — ABNORMAL HIGH (ref 98–107)
Lab: 140 — AB
Lab: 19 — ABNORMAL HIGH (ref 7–18)
Lab: 4.4

## 2018-03-17 ENCOUNTER — Encounter: Admit: 2018-03-17 | Discharge: 2018-03-17 | Payer: MEDICARE

## 2018-03-17 DIAGNOSIS — I5032 Chronic diastolic (congestive) heart failure: Principal | ICD-10-CM

## 2018-03-17 DIAGNOSIS — I519 Heart disease, unspecified: ICD-10-CM

## 2018-03-17 DIAGNOSIS — N184 Chronic kidney disease, stage 4 (severe): ICD-10-CM

## 2018-03-29 LAB — PROTIME INR (PT): Lab: 1 — ABNORMAL LOW (ref 4.35–5.85)

## 2018-03-31 LAB — COMPREHENSIVE METABOLIC PANEL
Lab: 0.7
Lab: 1.9 — ABNORMAL HIGH (ref 0.60–1.30)
Lab: 11
Lab: 110 — ABNORMAL HIGH (ref 98–107)
Lab: 120 — ABNORMAL HIGH (ref 70–105)
Lab: 142 — ABNORMAL LOW (ref 11.5–16.0)
Lab: 16
Lab: 21
Lab: 26
Lab: 28 — ABNORMAL HIGH (ref 7–18)
Lab: 3.3
Lab: 4 — ABNORMAL LOW (ref 35–52)
Lab: 59
Lab: 6.1 — ABNORMAL LOW (ref 6.4–8.2)
Lab: 8
Lab: 8.8

## 2018-03-31 LAB — CBC: Lab: 3.4 — ABNORMAL LOW (ref 4.3–11.0)

## 2018-04-01 ENCOUNTER — Encounter: Admit: 2018-04-01 | Discharge: 2018-04-01 | Payer: MEDICARE

## 2018-04-06 LAB — CBC AND DIFF
Lab: 1 (ref 1.0–4.0)
Lab: 10 (ref 7.4–10.4)
Lab: 128 (ref 130–400)
Lab: 14 (ref 10–14.5)
Lab: 18 (ref 12–44)
Lab: 3.7 (ref 1.8–7.8)
Lab: 67 (ref 42–75)

## 2018-04-06 LAB — COMPREHENSIVE METABOLIC PANEL
Lab: 0.6 (ref 0.1–1.0)
Lab: 106 (ref 98–107)
Lab: 135 (ref 135–145)
Lab: 17 (ref 5–34)
Lab: 19 — ABNORMAL LOW (ref >60)
Lab: 2.5 — ABNORMAL HIGH (ref 0.60–1.30)
Lab: 207 — ABNORMAL HIGH (ref 70–105)
Lab: 21 (ref 21–32)
Lab: 3.4 (ref 3.2–4.5)
Lab: 40 — ABNORMAL HIGH (ref 7–18)
Lab: 6.6 (ref 6.4–8.2)
Lab: 64 (ref 40–136)
Lab: 8 (ref 5–14)
Lab: 8.9 (ref 8.5–10.1)
Lab: 9 (ref 0–55)

## 2018-04-06 LAB — FERRITIN: Lab: 504 — ABNORMAL HIGH (ref 20–177)

## 2018-04-13 ENCOUNTER — Encounter: Admit: 2018-04-13 | Discharge: 2018-04-13 | Payer: MEDICARE

## 2018-04-19 ENCOUNTER — Encounter: Admit: 2018-04-19 | Discharge: 2018-04-19 | Payer: MEDICARE

## 2018-04-20 ENCOUNTER — Encounter: Admit: 2018-04-20 | Discharge: 2018-04-20 | Payer: MEDICARE

## 2018-04-20 DIAGNOSIS — D699 Hemorrhagic condition, unspecified: ICD-10-CM

## 2018-04-20 DIAGNOSIS — M1990 Unspecified osteoarthritis, unspecified site: ICD-10-CM

## 2018-04-20 DIAGNOSIS — M549 Dorsalgia, unspecified: ICD-10-CM

## 2018-04-20 DIAGNOSIS — I5032 Chronic diastolic (congestive) heart failure: ICD-10-CM

## 2018-04-20 DIAGNOSIS — E039 Hypothyroidism, unspecified: ICD-10-CM

## 2018-04-20 DIAGNOSIS — I2510 Atherosclerotic heart disease of native coronary artery without angina pectoris: Principal | ICD-10-CM

## 2018-04-20 DIAGNOSIS — K319 Disease of stomach and duodenum, unspecified: ICD-10-CM

## 2018-04-20 DIAGNOSIS — I10 Essential (primary) hypertension: Secondary | ICD-10-CM

## 2018-04-20 DIAGNOSIS — E119 Type 2 diabetes mellitus without complications: ICD-10-CM

## 2018-04-20 DIAGNOSIS — H547 Unspecified visual loss: ICD-10-CM

## 2018-04-20 MED ORDER — MAGNESIUM OXIDE 400 MG (241.3 MG MAGNESIUM) PO TAB
ORAL_TABLET | Freq: Two times a day (BID) | ORAL | 3 refills | Status: AC
Start: 2018-04-20 — End: 2018-05-05

## 2018-05-02 ENCOUNTER — Encounter: Admit: 2018-05-02 | Discharge: 2018-05-02 | Payer: MEDICARE

## 2018-05-04 LAB — BNP (B-TYPE NATRIURETIC PEPTI): Lab: 273 — ABNORMAL HIGH (ref >100)

## 2018-05-05 MED ORDER — MAGNESIUM OXIDE 400 MG (241.3 MG MAGNESIUM) PO TAB
ORAL_TABLET | Freq: Two times a day (BID) | ORAL | 3 refills | Status: AC
Start: 2018-05-05 — End: ?

## 2018-05-11 ENCOUNTER — Encounter: Admit: 2018-05-11 | Discharge: 2018-05-11 | Payer: MEDICARE

## 2018-05-11 DIAGNOSIS — I5032 Chronic diastolic (congestive) heart failure: Principal | ICD-10-CM

## 2018-05-12 MED ORDER — SPIRONOLACTONE 25 MG PO TAB
12.5 mg | ORAL_TABLET | Freq: Every day | ORAL | 3 refills | 46.00000 days | Status: AC
Start: 2018-05-12 — End: 2018-05-20

## 2018-05-12 MED ORDER — BUMETANIDE 1 MG PO TAB
1 mg | ORAL_TABLET | Freq: Two times a day (BID) | ORAL | 2 refills | Status: AC
Start: 2018-05-12 — End: 2018-06-23

## 2018-05-13 ENCOUNTER — Encounter: Admit: 2018-05-13 | Discharge: 2018-05-13 | Payer: MEDICARE

## 2018-05-18 LAB — BASIC METABOLIC PANEL: Lab: 142 (ref 135–145)

## 2018-05-20 ENCOUNTER — Encounter: Admit: 2018-05-20 | Discharge: 2018-05-20 | Payer: MEDICARE

## 2018-05-20 DIAGNOSIS — I5032 Chronic diastolic (congestive) heart failure: Principal | ICD-10-CM

## 2018-06-14 ENCOUNTER — Encounter: Admit: 2018-06-14 | Discharge: 2018-06-14 | Payer: MEDICARE

## 2018-06-14 DIAGNOSIS — I10 Essential (primary) hypertension: Secondary | ICD-10-CM

## 2018-06-14 DIAGNOSIS — K319 Disease of stomach and duodenum, unspecified: Secondary | ICD-10-CM

## 2018-06-14 DIAGNOSIS — D699 Hemorrhagic condition, unspecified: Secondary | ICD-10-CM

## 2018-06-14 DIAGNOSIS — I5032 Chronic diastolic (congestive) heart failure: Secondary | ICD-10-CM

## 2018-06-14 DIAGNOSIS — H547 Unspecified visual loss: Secondary | ICD-10-CM

## 2018-06-14 DIAGNOSIS — E039 Hypothyroidism, unspecified: Secondary | ICD-10-CM

## 2018-06-14 DIAGNOSIS — I2510 Atherosclerotic heart disease of native coronary artery without angina pectoris: Secondary | ICD-10-CM

## 2018-06-14 DIAGNOSIS — M1990 Unspecified osteoarthritis, unspecified site: Secondary | ICD-10-CM

## 2018-06-14 DIAGNOSIS — E119 Type 2 diabetes mellitus without complications: Secondary | ICD-10-CM

## 2018-06-14 DIAGNOSIS — M549 Dorsalgia, unspecified: Secondary | ICD-10-CM

## 2018-06-23 ENCOUNTER — Encounter: Admit: 2018-06-23 | Discharge: 2018-06-23 | Payer: MEDICARE

## 2018-06-23 ENCOUNTER — Ambulatory Visit: Admit: 2018-06-23 | Discharge: 2018-06-23 | Payer: MEDICARE

## 2018-06-23 DIAGNOSIS — I10 Essential (primary) hypertension: Secondary | ICD-10-CM

## 2018-06-23 DIAGNOSIS — K319 Disease of stomach and duodenum, unspecified: Secondary | ICD-10-CM

## 2018-06-23 DIAGNOSIS — E119 Type 2 diabetes mellitus without complications: Secondary | ICD-10-CM

## 2018-06-23 DIAGNOSIS — I2510 Atherosclerotic heart disease of native coronary artery without angina pectoris: Secondary | ICD-10-CM

## 2018-06-23 DIAGNOSIS — M549 Dorsalgia, unspecified: Secondary | ICD-10-CM

## 2018-06-23 DIAGNOSIS — I5032 Chronic diastolic (congestive) heart failure: Secondary | ICD-10-CM

## 2018-06-23 DIAGNOSIS — E039 Hypothyroidism, unspecified: Secondary | ICD-10-CM

## 2018-06-23 DIAGNOSIS — D699 Hemorrhagic condition, unspecified: Secondary | ICD-10-CM

## 2018-06-23 DIAGNOSIS — H547 Unspecified visual loss: Secondary | ICD-10-CM

## 2018-06-23 DIAGNOSIS — M1990 Unspecified osteoarthritis, unspecified site: Secondary | ICD-10-CM

## 2018-06-23 MED ORDER — METOLAZONE 5 MG PO TAB
5 mg | ORAL_TABLET | ORAL | 3 refills | 84.00000 days | Status: AC
Start: 2018-06-23 — End: 2019-02-03

## 2018-06-23 MED ORDER — BUMETANIDE 1 MG PO TAB
2 mg | ORAL_TABLET | Freq: Two times a day (BID) | ORAL | 2 refills | Status: AC
Start: 2018-06-23 — End: 2018-12-03

## 2018-06-24 ENCOUNTER — Ambulatory Visit: Admit: 2018-06-23 | Discharge: 2018-06-24 | Payer: MEDICARE

## 2018-06-24 DIAGNOSIS — I519 Heart disease, unspecified: Secondary | ICD-10-CM

## 2018-06-24 DIAGNOSIS — Z951 Presence of aortocoronary bypass graft: Secondary | ICD-10-CM

## 2018-06-24 DIAGNOSIS — K7460 Unspecified cirrhosis of liver: Secondary | ICD-10-CM

## 2018-06-24 DIAGNOSIS — I480 Paroxysmal atrial fibrillation: Secondary | ICD-10-CM

## 2018-06-24 DIAGNOSIS — I5032 Chronic diastolic (congestive) heart failure: Secondary | ICD-10-CM

## 2018-06-24 DIAGNOSIS — D649 Anemia, unspecified: Secondary | ICD-10-CM

## 2018-06-24 DIAGNOSIS — I10 Essential (primary) hypertension: Secondary | ICD-10-CM

## 2018-06-24 DIAGNOSIS — I2510 Atherosclerotic heart disease of native coronary artery without angina pectoris: Secondary | ICD-10-CM

## 2018-06-24 DIAGNOSIS — K31819 Angiodysplasia of stomach and duodenum without bleeding: Secondary | ICD-10-CM

## 2018-06-24 DIAGNOSIS — I110 Hypertensive heart disease with heart failure: Secondary | ICD-10-CM

## 2018-06-24 DIAGNOSIS — Z7189 Other specified counseling: Secondary | ICD-10-CM

## 2018-07-16 ENCOUNTER — Encounter: Admit: 2018-07-16 | Discharge: 2018-07-16 | Payer: MEDICARE

## 2018-07-16 LAB — CBC AND DIFF

## 2018-09-14 LAB — CBC
Lab: 10 g/dL — ABNORMAL LOW (ref 12.0–15.0)
Lab: 137 M/UL — ABNORMAL LOW (ref 4.0–5.0)
Lab: 15 % — ABNORMAL HIGH (ref 10.0–14.5)
Lab: 20 mg/dL — CL (ref 35–52)
Lab: 3.8 mg/dL — ABNORMAL LOW (ref 4.3–11.0)
Lab: 31 10*3/uL — ABNORMAL LOW (ref 32–36)
Lab: 31 U/L — ABNORMAL HIGH (ref 25–110)
Lab: 99 g/dL (ref 3.5–5.0)

## 2018-09-14 LAB — BASIC METABOLIC PANEL
Lab: 109 — ABNORMAL HIGH (ref 98–107)
Lab: 123 U/L — ABNORMAL HIGH (ref 70–105)
Lab: 141
Lab: 2.2 mg/dL — ABNORMAL HIGH (ref 0.60–1.30)
Lab: 22 mg/dL (ref 7–25)
Lab: 4.6
Lab: 8.4 mg/dL — ABNORMAL LOW (ref 8.5–10.1)

## 2018-09-14 LAB — MAGNESIUM: Lab: 2.6 — ABNORMAL HIGH (ref 1.6–2.4)

## 2018-09-14 LAB — BNP (B-TYPE NATRIURETIC PEPTI)

## 2018-09-15 ENCOUNTER — Encounter: Admit: 2018-09-15 | Discharge: 2018-09-15 | Payer: MEDICARE

## 2018-09-15 DIAGNOSIS — I519 Heart disease, unspecified: ICD-10-CM

## 2018-09-15 DIAGNOSIS — I480 Paroxysmal atrial fibrillation: ICD-10-CM

## 2018-09-15 DIAGNOSIS — I5032 Chronic diastolic (congestive) heart failure: Principal | ICD-10-CM

## 2018-09-22 ENCOUNTER — Encounter: Admit: 2018-09-22 | Discharge: 2018-09-22 | Payer: MEDICARE

## 2018-09-23 ENCOUNTER — Encounter: Admit: 2018-09-23 | Discharge: 2018-09-23 | Payer: MEDICARE

## 2018-09-23 NOTE — Telephone Encounter
Pt called and requested medication for nausea. LOV 10/29/17. Pt's PCP stopped providing refills of zofran and promethazine. See's Dr. Fraser Din, Hepatology. Has GAVE syndrome. Has multiple blood transfusion from Dr. Lysle Morales, hematology/oncology at [facility] in [place], [place]. Last time blood transfusion was last Friday.    Routing to Dr. Teryl Lucy. [place]mdjou to advise.

## 2018-10-27 ENCOUNTER — Encounter: Admit: 2018-10-27 | Discharge: 2018-10-27 | Payer: MEDICARE

## 2018-10-27 NOTE — Telephone Encounter
Please call pt to rsc 05/28 clinic appt

## 2018-11-25 ENCOUNTER — Encounter: Admit: 2018-11-25 | Discharge: 2018-11-25

## 2018-11-25 NOTE — Telephone Encounter
Pt returned call, called to assess:      Has been to ED twice recently "my ammonia was off"  Went to via [facility]       Patient Status  patient is an established patient with [facility].        Signs and Symptoms  Swelling is better  SOA same as usual   Stomach bloated     Chest pain daily, rated anywhere from 2-8 on pain scale.   Lasts 1-2 min       Medication Review  bumex 1 mg daily- for last month, pt prescribed 2 mg BID   Metolazone 5 mg twice weekly   Metoprolol 12.5 mg BID    No weights from 6/7 to 6/23     Date Weight B/P Pulse   6/24      6/23 208 110/68  122/47- 6/10  135/55- 6/19  127/60 6/16    6/7 212

## 2018-11-25 NOTE — Telephone Encounter
Message received from pt 11/25/18 at 1612 requesting cb from Angelyn Punt, RN. Pt states she has an appointment with hepatology today and states "I've been having a rough go of it lately".    Returned call to pt and LM on VM to cb.

## 2018-11-25 NOTE — Telephone Encounter
Second message from pt wanting to know what the policy is for visitors. Will provide this information when pt calls back.

## 2018-11-26 ENCOUNTER — Encounter: Admit: 2018-11-26 | Discharge: 2018-11-26

## 2018-11-26 ENCOUNTER — Ambulatory Visit: Admit: 2018-11-26 | Discharge: 2018-11-27

## 2018-11-26 DIAGNOSIS — I2510 Atherosclerotic heart disease of native coronary artery without angina pectoris: Secondary | ICD-10-CM

## 2018-11-26 DIAGNOSIS — E119 Type 2 diabetes mellitus without complications: Secondary | ICD-10-CM

## 2018-11-26 DIAGNOSIS — I10 Essential (primary) hypertension: Secondary | ICD-10-CM

## 2018-11-26 DIAGNOSIS — M1990 Unspecified osteoarthritis, unspecified site: Secondary | ICD-10-CM

## 2018-11-26 DIAGNOSIS — E039 Hypothyroidism, unspecified: Secondary | ICD-10-CM

## 2018-11-26 DIAGNOSIS — I5033 Acute on chronic diastolic (congestive) heart failure: Secondary | ICD-10-CM

## 2018-11-26 DIAGNOSIS — I5032 Chronic diastolic (congestive) heart failure: Secondary | ICD-10-CM

## 2018-11-26 DIAGNOSIS — D699 Hemorrhagic condition, unspecified: Secondary | ICD-10-CM

## 2018-11-26 DIAGNOSIS — K319 Disease of stomach and duodenum, unspecified: Secondary | ICD-10-CM

## 2018-11-26 DIAGNOSIS — H547 Unspecified visual loss: Secondary | ICD-10-CM

## 2018-11-26 DIAGNOSIS — K7460 Unspecified cirrhosis of liver: Principal | ICD-10-CM

## 2018-11-26 DIAGNOSIS — K7290 Hepatic failure, unspecified without coma: Secondary | ICD-10-CM

## 2018-11-26 DIAGNOSIS — D649 Anemia, unspecified: Secondary | ICD-10-CM

## 2018-11-26 DIAGNOSIS — N183 Chronic kidney disease, stage 3 (moderate): Secondary | ICD-10-CM

## 2018-11-26 DIAGNOSIS — M549 Dorsalgia, unspecified: Secondary | ICD-10-CM

## 2018-11-26 NOTE — Telephone Encounter
Called and left detailed VM for patient to call the clinic about setting up for my chart for a tele health visit.

## 2018-11-26 NOTE — Progress Notes
Date of Service: 11/26/2018     Subjective:             Diane Savage is a 63 y.o. female presenting for follow up of fairly compensated cryptogenic cirrhosis, in the setting of multiple co morbidities.  ???  Patient is accompanied by her husband.  ???  Since we last saw Diane Savage, she has, unfortunately been seen in her local ER twice for confusion (loopy). She reports having a high ammonia level. She notes that her lactulose made her have diarrhea and high blood sugar. She took it intermittently. She stopped lactulose yesterday, and has been taking Xifaxan as of yesterday prescribed by the ER. She was not admitted, as they couldn't find anything wrong. She does note that her Hb dropped from 9.6 to 8.2. She routinely gets transfused (now reports total of 104 units), has Fe infusion now weekly, and is on Aranesp. She has not had overt bleeding. She missed her f/u EGD to assess GAVE during the pandemic. She further denies jaundice, or pruritus.   ???  Last colon 08/2017 performed by me for follow up of poor prep with <1cm polyp x 4 (2 x TA, 2 not retrieved, thus presume TA x 4).  ???  History of Present Illness  Diane Savage is a 63 y.o. 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    ???  Please see initial consult note for further details.  ???  It was felt that the majority of her symptoms were driven by HF, although she is presumed cirrhotic. Niece who is a nurse requested repeat EGD for assessment/treatment of GAVE.  ???  Review of Systems  A complete 10 point ROS was otherwise negative except as per HPI.  ???  Past medical/surgical/family/social history and allergies reviewed and unchanged unless noted above.    Objective: ??? bumetanide (BUMEX) 1 mg tablet Take two tablets by mouth twice daily.   ??? cetirizine (ZYRTEC) 10 mg tablet Take 10 mg by mouth every morning.   ??? clobetasol (TEMOVATE) 0.05 % topical cream Apply to affected area twice daily as needed   ??? diphenhydrAMINE (BENADRYL ALLERGY) 25 mg tablet Take 25 mg by mouth every 6 hours as needed for Sleep.   ??? folic acid (FOLVITE) 1 mg tablet Take 1 mg by mouth daily.   ??? glucosam-chondroit-C-manganese 500-400-2-0.33 mg cap Take 500 mg by mouth twice daily.   ??? insulin aspart U-100 (NOVOLOG) 100 unit/mL injection Inject four Units under the skin three times daily with meals.   ??? insulin NPH (HUMULIN N NPH U-100 INSULIN) 100 unit/mL injection Inject fourteen Units under the skin every morning.   ??? Insulin Syringe-Needle U-100 (BD INSULIN SYRINGE ULTRA-FINE) 0.3 mL 31 gauge x 5/16 syrg Use four times daily with Insulin   ??? levothyroxine (SYNTHROID) 175 mcg tablet Take 175 mcg by mouth every 48 hours.   ??? magnesium oxide (MAG-OX) 400 mg (241.3 mg magnesium) tablet TAKE ONE (1) TABLET BY MOUTH TWICE DAILY   ??? metOLazone (ZAROXOLYN) 5 mg tablet Take one tablet by mouth twice weekly. 1 hour before morning bumex. Only take if weight is above 195 pounds.   ??? metoprolol tartrate (LOPRESSOR) 25 mg tablet Take one-half tablet by mouth  twice daily.   ??? nitroglycerin (NITROSTAT) 0.4 mg tablet Place 0.4 mg under tongue every 5 minutes as needed for Chest Pain. Max of 3 tablets, call 911.   ??? omeprazole DR(+) (PRILOSEC) 40 mg capsule Take 40 mg by mouth twice daily.   ??? ondansetron (ZOFRAN) 8 mg tablet Take 8 mg by mouth every 8 hours as needed for Nausea or Vomiting.   ??? prochlorperazine maleate (COMPAZINE) 10 mg tablet Take 10 mg by mouth every 6 hours as needed for Nausea or Vomiting.   ??? rifAXIMin (XIFAXAN) 550 mg tablet Take 550 mg by mouth every 12 hours.   ??? tiZANidine (ZANAFLEX) 4 mg tablet Take 4 mg by mouth at bedtime daily. ??? traMADol (ULTRAM) 50 mg tablet Take one tablet by mouth every 6 hours as needed for Pain.     Vitals:    11/26/18 1153   BP: 139/62   BP Source: Arm, Left Upper   Patient Position: Sitting   Pulse: 72   Resp: 16   Temp: 36.8 ???C (98.2 ???F)   TempSrc: Oral   SpO2: 93%   Weight: 95.8 kg (211 lb 3.2 oz)   Height: 162.6 cm (64)   PainSc: Eight     Body mass index is 36.25 kg/m???.     Physical Exam    Vitals reviewed.   Constitutional: Well-developed, well-nourished, in no apparent distress, wheelchair in room. ???  HEENT: Normocephalic.???No???scleral icterus.???Moist oral mucosa. Dentition???fair  CV: RRR  Resp: non labored  Neck/Lymph: Normal ROM,???supple. No thyromegaly. ???No lymphadenopathy  GI: ???Abdomen obese,???soft, non-distended, non-tender. BS present.???possible???shifting dullness vs body wall edema.???Exam compromised by body habitus??????  Skin: ???Skin is warm???and dry.???No rash noted.??????No jaundice.???+???spider nevi???noted. ???No???palmar erythema  Peripheral Vascular:???1+???lower extremity edema, improved. 2+ pulses in all extremities  Musculoskeletal: ???ROM intact,???borderline???muscle bulk??????  Psychiatric: Normal mood and affect. Behavior is normal.  Neuro:??????No???asterixis,???no???tremor    Lab and Imaging Data:  Reviewed from OSH ER  And reviewed OSH 6/8       Assessment and Plan:    Cryptogenic cirrhosis:??????She is presumed cirrhotic, based on history, exam, laboratory, and imaging findings including endoscopy. She has had a negative serologic work up for other liver disease. 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 Historically, her symptoms have been related to her HF, as well as her volume overload, but now with HE, with recent repeated ER visits. Her liver function remains preserved with MELD driven by AKI.  - Biopsy is still deferred.  ???  Liver Transplant Candidacy:???deferred 2/2 low MELD. Should her MELD become competitive and/or she develop transplant-defining conditions, however, she would currently not be an OLT candidate d/t co morbidities, including heart disease, morbid obesity (BMI now improved), and very limited functionally. This was again discussed with patient and her family.  ???  Hepatocellular Cancer Screening:???negative MR 02/2018.  - Doppler US due now.  - Recommend hepatoma screening q6-12 months with either abdominal ultrasound or contrasted, cross-sectional imaging.  ???  Volume overload:??????likely never with appreciable ascites (trace on our imaging; no paras), but with history of pleural effusions, additionally related to HF (as with her other symptoms).   - Diuretics managed by heart failure.  - We have discussed echo bubble study to evaluate for hepatopulmonary component, if her hypoxia would persist in the absence of pleural effusions and euvolemic/HF stable (she has several reasons to be SOB).  ???  Hepatic Encephalopathy:???uncontrolled, seems noncompliant with lactulose, now on Xifaxan monotherapy (SE from lactulose).  - continue Miralax for  constipation.  - Continue Xifaxan.   - Recommend strict avoidance of medications such as narcotics, sedatives and sleep aids. Low dose Benadryl PRN or melatonin can be used for insomnia, if needed.  - we discussed precipitants of HE, including infection, bleeding, AKI, etc.  ???  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  - Recommend repeating an upper GI endoscopy???yearly.???She is on selective BB for her heart disease.  ???  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 She has not had any significant bleeding nor admissions for acute on chronic anemia since she has been under our care, although she continues with frequent transfusions, Fe infusions, and now Aranesp. Last EGD with mild PHG and GAVE, not necessitating treatment.  - Goal Hb >8 given heart disease.  - Would not pursue aggressive diagnostics in her; however, may reassess need for treatment of GAVE.  ??? 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  - All patients with liver disease should avoid the use of Non-steroidal Anti-Inflammatory (NSAID) medications as they can cause significant injury to the kidneys in this population.  ???  Immobility/Frailty:???she remains frail and sedentary.  ???  Nutrition:  As with most patients with chronic liver disease, there is a degree of protein malnutrition.???She is at the same time obese.???Dicussed need to change dietary habits to improve overall protein balance. ???Discussed the importance of eating between 1.2-1.5g/kg/day lean protein such as plant-based proteins (soy, legumes, nuts), dairy-based proteins (cheese, milk, yogurt), white meat chicken or pork, fish, and eggs. Continue to follow a sodium restricted (<2g sodium diet), heart-healthy, Mediterranean style diet. It is best to minimize the intake of carbohydrates and sugars, to avoid obesity.   - Strongly encourage protein supplements 2-3 times daily (Boost, Ensure, [employer] Milk, etc.) between meals, or 5-6 times daily for meal replacement, to meet protein and caloric intake.  - Recommend a bedtime snack with protein and complex carbohydrate to minimize risk of muscle catabolism overnight.  ???  Routine Health Care in Patient with Chronic Liver Disease:  -???Hep A immune. 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 If she should ever be placed on IS or chemotherapy, including highest risk B cell depleting agents, she should be placed on antiviral prophylaxis.  - All patients with liver disease are at an increased risk for osteoporosis. ???We strongly recommend screening for Vitamin D deficiency with aggressive supplementation/replacement, as indicated. Screening for all fat soluble vitamin deficiencies should be performed in those patients with cholestatic liver disease.   - Baseline DEXA scan should be performed to evaluate for decreased bone mineral density. If present, Ca/Vit D +/- bisphosphonate therapy should be considered.Follow up DEXA scans shall be based on prior scans as well as therapies. It is not uncommon for our patients to be referred to an Endocrinologist/bone health specialist.  - The preferred analgesic in cirrhosis and liver disease is Tylenol, up to 2g daily.  - Recommend age appropriate vaccination including yearly influenza and Pneumovax prior to OLT in cirrhotic patients.  ???  Follow Up:  6 mos (TH)  Labs today:???none  ???  Imaging/biopsy:???Doppler US now???  ???  Thank you very much for the opportunity to participate in the care of this patient. ???If you have any further questions, please don't hesitate to contact our office.  ???  _____________________________  Cherlynn June, MD  Assistant Professor of Medicine  Advanced Hepatology & Liver Transplantation  [facility]

## 2018-11-26 NOTE — Telephone Encounter
Spoke with pt, mychart information sent to email. Pt does not have Internet at home but will be at [facility] tomorrow and try to set up mychart at that time. Pt would be willing to go to local hospital to use Internet for visit woth Dr. Adah Perl as well.

## 2018-11-30 NOTE — Telephone Encounter
records received, sent for scan on 11/30/2018

## 2018-12-01 ENCOUNTER — Encounter: Admit: 2018-12-01 | Discharge: 2018-12-01

## 2018-12-01 NOTE — Telephone Encounter
Pt to have telephone OV on 7/20.

## 2018-12-01 NOTE — Telephone Encounter
Message sent to scheduling to offer patient appointment options for OV on 7/20.

## 2018-12-03 MED ORDER — BUMETANIDE 1 MG PO TAB
ORAL_TABLET | Freq: Two times a day (BID) | 2 refills | Status: DC
Start: 2018-12-03 — End: 2019-04-09

## 2018-12-07 ENCOUNTER — Encounter: Admit: 2018-12-07 | Discharge: 2018-12-07

## 2018-12-07 NOTE — Telephone Encounter
Will forward to MA for assistance scheduling.

## 2018-12-18 NOTE — Telephone Encounter
Request for [place] appt has been sent Diane Savage.

## 2018-12-21 ENCOUNTER — Encounter: Admit: 2018-12-21 | Discharge: 2018-12-21

## 2018-12-21 DIAGNOSIS — I10 Essential (primary) hypertension: Secondary | ICD-10-CM

## 2018-12-21 DIAGNOSIS — K319 Disease of stomach and duodenum, unspecified: Secondary | ICD-10-CM

## 2018-12-21 DIAGNOSIS — E039 Hypothyroidism, unspecified: Secondary | ICD-10-CM

## 2018-12-21 DIAGNOSIS — I5032 Chronic diastolic (congestive) heart failure: Secondary | ICD-10-CM

## 2018-12-21 DIAGNOSIS — D699 Hemorrhagic condition, unspecified: Secondary | ICD-10-CM

## 2018-12-21 DIAGNOSIS — M549 Dorsalgia, unspecified: Secondary | ICD-10-CM

## 2018-12-21 DIAGNOSIS — H547 Unspecified visual loss: Secondary | ICD-10-CM

## 2018-12-21 DIAGNOSIS — E119 Type 2 diabetes mellitus without complications: Secondary | ICD-10-CM

## 2018-12-21 DIAGNOSIS — M1990 Unspecified osteoarthritis, unspecified site: Secondary | ICD-10-CM

## 2018-12-21 DIAGNOSIS — I480 Paroxysmal atrial fibrillation: Secondary | ICD-10-CM

## 2018-12-21 DIAGNOSIS — I2510 Atherosclerotic heart disease of native coronary artery without angina pectoris: Secondary | ICD-10-CM

## 2018-12-21 NOTE — Progress Notes
Date of Service: 12/21/2018    Obtained patient's verbal consent to treat them and their agreement to [facility] financial policy and NPP via this telehealth visit during the [facility] Emergency.    This telephone visit was conducted via communication between the patient and physician/provider due to COVID-19.  We have found that certain health care needs can be provided without an in-person visit. This service lets [place] provide the care patients' need.  If a prescription is necessary we can send it directly to their pharmacy.  If testing is necessary this can be arranged.    Diane Savage is a 63 y.o. female.     HPI     Diane Savage was called for routine follow up.  As you recall, she is now 63 years old, and I follow her for diastolic heart failure, but she has complex medical history including stable coronary disease, paroxysmal atrial fib, GAVE/cirrhosis with transfusion-dependent anemia, chronic kidney disease, and several other comorbidities as listed in her problem as below.  She tells me her breathing is okay, but her appetite has been off, and she does still require transfusions.  She has had progressive decline both in her functional status and her mental status.  She states she often is talking funny, her mind is cloudy, and she is unable to participate as much as she would like in her activities of daily living.  She was hospitalized earlier this year for an episode of profound confusion and weakness when she was unable to get off of the toilet.  She was evaluated for sepsis, but no infection was found.     Today she reports headache and nausea; resolved chest pain.  She hasn't had a transfusion for over a month.  She still has confusion and was in the hospital again for confusion and high ammonia levels.  She does get dizzy spels    She currently denies exertional chest pain, irregular heartbeat/palpitations, dizzy spells, syncope/near syncope, edema, orthopnea, and paroxysmal nocturnal dyspnea. Her weight monitored on home scale has been up 5-8 pounds since she saw Dr. Murlean Caller in liver clinic last month.  For blood pressure, 120-130/60 is typical but it was lower today, 98/42.  She does not feel too dehydrated and was dizzy today on standing.            Vitals:    12/21/18 1254   BP: 98/42   BP Source: Arm, Left Upper   Pulse: 64   SpO2: 94%   Weight: 98 kg (216 lb)   Height: 1.626 m (5' 4)   PainSc: Zero          Body mass index is 37.08 kg/m???.     Past Medical History  Patient Active Problem List    Diagnosis Date Noted   ??? CKD  09/18/2017   ??? Morbid obesity (HCC) 05/20/2017   ??? Acute on chronic diastolic heart failure due to coronary artery disease (HCC) 03/19/2017   ??? Chronic diastolic heart failure (HCC) 03/19/2017   ??? Barrett's esophagus 03/18/2017   ??? Left leg cellulitis 03/17/2017   ??? Pleural effusion on left 03/14/2017   ??? Essential hypertension 02/11/2017   ??? AKI (acute kidney injury) (HCC) 02/06/2017   ??? CAD (coronary artery disease), native coronary artery 02/04/2017     02/04/17: CABG x4 (LIMA to the LAD, reversed SVG in sequence to the OM & left posterolateral arteries, and reversed SVG anastomosed to the posterior descending branch of the RCA). Bilateral modified CryoMaze  procedure (pulmonary vein isolation). Suture ligation of left atrial appendage.     ??? HLD (hyperlipidemia) 02/04/2017   ??? Hypothyroidism 02/04/2017   ??? 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 Thrombocytopenia (HCC) 02/04/2017   ??? 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 Type 2 diabetes mellitus with circulatory disorder, without long-term current use of insulin (HCC) 01/31/2017   ??? PAF (paroxysmal atrial fibrillation) (HCC) 01/31/2017     02/04/17: Bilateral modified CryoMaze procedure (pulmonary vein isolation) & Suture ligation of left atrial appendage at time of CABG.     ??? Chronic gastrointestinal bleeding 01/31/2017 ??? 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 Absolute anemia 11/01/2016     Added automatically from request for surgery [id number]           Review of Systems   Constitution: Negative.   HENT: Negative.    Eyes: Negative.    Cardiovascular: Positive for dyspnea on exertion and leg swelling.   Respiratory: Negative.    Endocrine: Negative.    Hematologic/Lymphatic: Negative.    Skin: Negative.    Musculoskeletal: Negative.    Gastrointestinal: Positive for nausea.   Genitourinary: Negative.    Neurological: Positive for dizziness, headaches and light-headedness.   Psychiatric/Behavioral: Negative.    Allergic/Immunologic: Negative.    All other systems reviewed and are negative.      Physical exam limited d/t video tele health visit:  Phone visit, not done    Cardiovascular Studies      Problems Addressed Today  Encounter Diagnoses   Name Primary?   ??? Chronic diastolic heart failure (HCC) Yes   ??? PAF (paroxysmal atrial fibrillation) (HCC)    ??? Acute on chronic diastolic heart failure due to coronary artery disease (HCC)    ??? Heart disease, unspecified     ??? Heart disease        Assessment and Plan      From a heart failure standpoint, she has been dizzy and today is hypotensive.  I would like her to stop beta-blocker completely for now and check blood pressure routinely.  She can resume low-dose beta-blocker if blood pressure is consistently 130/80, or higher.  We will reassess on next visit and continue her diuretic for the time being.  She will tend to need more diuretic when she gets blood transfusions or infusions.  She will get an echocardiogram with bubble study on next visit to assess for any shunt from hepatopulmonary disease.  We will otherwise continue to follow her labs closely, and they are checked at least monthly locally.  We will check in with her next week by phone to see what her blood pressure and weight trend is.    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  Unfortunately, she missed follow-up EGD to assess GAVE due to the COVID-19 pandemic.  She will continue to follow with Dr. Rise Mu as well as her local primary care and hematology physicians.    I do worry about her overall prognosis given her significant liver, kidney, and heart disease.  Her functional status remains poor, and she has previously seen palliative care on prior visit earlier this year.  We will continue to monitor her home situation and chronic disease management.  Should escalation of care be needed, I am happy to participate in these conversations.    Thank you for allowing me to participate in the  care of this patient.  Please do not hesitate to contact me should you have any questions or concerns.    Baldo Ash, MD, PhD  Advanced Heart Failure and Transplant Cardiology  Pager [phone]             I spent 25 minutes with the patient today including approximately 15 minutes in counseling on her heart disease.  We reviewed the above treatment plan, went over risks/benefits/alternatives to the therapies, and all questions were answered to her satisfaction.      Current Medications (including today's revisions)  ??? bumetanide (BUMEX) 1 mg tablet TAKE TWO (2) TABLETS BY MOUTH TWICE DAILY    ??? cetirizine (ZYRTEC) 10 mg tablet Take 10 mg by mouth every morning.   ??? clobetasol (TEMOVATE) 0.05 % topical cream Apply to affected area twice daily as needed   ??? diphenhydrAMINE (BENADRYL ALLERGY) 25 mg tablet Take 25 mg by mouth every 6 hours as needed for Sleep.   ??? folic acid (FOLVITE) 1 mg tablet Take 1 mg by mouth daily.   ??? glucosam-chondroit-C-manganese 500-400-2-0.33 mg cap Take 500 mg by mouth twice daily.   ??? insulin aspart U-100 (NOVOLOG) 100 unit/mL injection Inject four Units under the skin three times daily with meals.   ??? insulin NPH (HUMULIN N NPH U-100 INSULIN) 100 unit/mL injection Inject fourteen Units under the skin every morning. ??? Insulin Syringe-Needle U-100 (BD INSULIN SYRINGE ULTRA-FINE) 0.3 mL 31 gauge x 5/16 syrg Use four times daily with Insulin   ??? levothyroxine (SYNTHROID) 175 mcg tablet Take 175 mcg by mouth every 48 hours.   ??? magnesium oxide (MAG-OX) 400 mg (241.3 mg magnesium) tablet TAKE ONE (1) TABLET BY MOUTH TWICE DAILY   ??? metOLazone (ZAROXOLYN) 5 mg tablet Take one tablet by mouth twice weekly. 1 hour before morning bumex. Only take if weight is above 195 pounds.   ??? metoprolol tartrate (LOPRESSOR) 25 mg tablet Take one-half tablet by mouth twice daily.   ??? nitroglycerin (NITROSTAT) 0.4 mg tablet Place 0.4 mg under tongue every 5 minutes as needed for Chest Pain. Max of 3 tablets, call 911.   ??? omeprazole DR(+) (PRILOSEC) 40 mg capsule Take 40 mg by mouth twice daily.   ??? ondansetron (ZOFRAN) 8 mg tablet Take 8 mg by mouth every 8 hours as needed for Nausea or Vomiting.   ??? prochlorperazine maleate (COMPAZINE) 10 mg tablet Take 10 mg by mouth every 6 hours as needed for Nausea or Vomiting.   ??? rifAXIMin (XIFAXAN) 550 mg tablet Take 550 mg by mouth every 12 hours.   ??? tiZANidine (ZANAFLEX) 4 mg tablet Take 4 mg by mouth at bedtime daily.   ??? traMADol (ULTRAM) 50 mg tablet Take one tablet by mouth every 6 hours as needed for Pain.

## 2018-12-22 ENCOUNTER — Ambulatory Visit: Admit: 2018-12-21 | Discharge: 2018-12-22

## 2018-12-22 DIAGNOSIS — I5033 Acute on chronic diastolic (congestive) heart failure: Secondary | ICD-10-CM

## 2018-12-22 DIAGNOSIS — I2510 Atherosclerotic heart disease of native coronary artery without angina pectoris: Secondary | ICD-10-CM

## 2018-12-22 DIAGNOSIS — I519 Heart disease, unspecified: Secondary | ICD-10-CM

## 2018-12-22 DIAGNOSIS — I5032 Chronic diastolic (congestive) heart failure: Principal | ICD-10-CM

## 2018-12-27 ENCOUNTER — Encounter: Admit: 2018-12-27 | Discharge: 2018-12-27

## 2018-12-27 DIAGNOSIS — M549 Dorsalgia, unspecified: Secondary | ICD-10-CM

## 2018-12-27 DIAGNOSIS — M1990 Unspecified osteoarthritis, unspecified site: Secondary | ICD-10-CM

## 2018-12-27 DIAGNOSIS — I5032 Chronic diastolic (congestive) heart failure: Secondary | ICD-10-CM

## 2018-12-27 DIAGNOSIS — E119 Type 2 diabetes mellitus without complications: Secondary | ICD-10-CM

## 2018-12-27 DIAGNOSIS — I10 Essential (primary) hypertension: Secondary | ICD-10-CM

## 2018-12-27 DIAGNOSIS — I2510 Atherosclerotic heart disease of native coronary artery without angina pectoris: Secondary | ICD-10-CM

## 2018-12-27 DIAGNOSIS — D699 Hemorrhagic condition, unspecified: Secondary | ICD-10-CM

## 2018-12-27 DIAGNOSIS — H547 Unspecified visual loss: Secondary | ICD-10-CM

## 2018-12-27 DIAGNOSIS — K319 Disease of stomach and duodenum, unspecified: Secondary | ICD-10-CM

## 2018-12-27 DIAGNOSIS — E039 Hypothyroidism, unspecified: Secondary | ICD-10-CM

## 2019-01-01 ENCOUNTER — Encounter: Admit: 2019-01-01 | Discharge: 2019-01-01

## 2019-01-01 NOTE — Telephone Encounter
Second attempt at calling patient made, no answer at this time.

## 2019-01-01 NOTE — Telephone Encounter
Pt had telephone OV with Dr. Adah Perl 7/20 with medication changes. Per Dr. Adah Perl request, I called patient to get update after medication changes were made.     Med changes at OV:     1. Med changes today:  Stop metoprolol.  OK to resume if blood pressure consistently 130/80 or higher.  2. OK to take higher dose bumex: 3mg  BID for three days or until weight goes back to 212 pounds or less, then resume 2mg  bid    No answer, left message for patient to call back with update.

## 2019-01-05 NOTE — Telephone Encounter
Pt returned call, states "everything is the same" and weight is 220 lbs. Pt reports having done to ED for pulse being 40 but her pulse was 70 when she arrived to ED.

## 2019-01-05 NOTE — Telephone Encounter
attempted to call patient again for update. no answer, left message on machine.

## 2019-01-11 NOTE — Telephone Encounter
Records request for recent ER visit sent again

## 2019-01-11 NOTE — Telephone Encounter
Records received from [place].     Upon review, HR in ER was in 60's, no medications that would cause pt to have slow HR. Pt was evaluated and diagnosed with early stages of pneumonia and sent home on antibiotics. Will send records for scan and continue to f/u with patient.

## 2019-01-29 ENCOUNTER — Encounter: Admit: 2019-01-29 | Discharge: 2019-01-29

## 2019-01-29 NOTE — Telephone Encounter
Called patient on the phone, left message for call back.     [facility] via [place] and spoke with echocardiogram scheduler. Order faxed over and she will call patient to schedule.

## 2019-02-02 NOTE — Telephone Encounter
Pt called asking about echo results. Pt had this done at [facility] yesterday. Called pt and informed this can be reviewed at [place], records request sent.

## 2019-02-03 ENCOUNTER — Ambulatory Visit: Admit: 2019-02-03 | Discharge: 2019-02-04

## 2019-02-03 ENCOUNTER — Encounter: Admit: 2019-02-03 | Discharge: 2019-02-03

## 2019-02-03 DIAGNOSIS — I5032 Chronic diastolic (congestive) heart failure: Secondary | ICD-10-CM

## 2019-02-03 DIAGNOSIS — M1990 Unspecified osteoarthritis, unspecified site: Secondary | ICD-10-CM

## 2019-02-03 DIAGNOSIS — R079 Chest pain, unspecified: Secondary | ICD-10-CM

## 2019-02-03 DIAGNOSIS — E119 Type 2 diabetes mellitus without complications: Secondary | ICD-10-CM

## 2019-02-03 DIAGNOSIS — I10 Essential (primary) hypertension: Secondary | ICD-10-CM

## 2019-02-03 DIAGNOSIS — K319 Disease of stomach and duodenum, unspecified: Secondary | ICD-10-CM

## 2019-02-03 DIAGNOSIS — I2510 Atherosclerotic heart disease of native coronary artery without angina pectoris: Principal | ICD-10-CM

## 2019-02-03 DIAGNOSIS — M549 Dorsalgia, unspecified: Secondary | ICD-10-CM

## 2019-02-03 DIAGNOSIS — E039 Hypothyroidism, unspecified: Secondary | ICD-10-CM

## 2019-02-03 DIAGNOSIS — D699 Hemorrhagic condition, unspecified: Secondary | ICD-10-CM

## 2019-02-03 DIAGNOSIS — H547 Unspecified visual loss: Secondary | ICD-10-CM

## 2019-02-04 ENCOUNTER — Encounter: Admit: 2019-02-04 | Discharge: 2019-02-04

## 2019-02-04 NOTE — Telephone Encounter
Pt needs rega MPI stress test, she would like to complete locally at [place]. Called and left message with nuc stress test department to get fax number to send order over. Awaiting callback.

## 2019-02-05 ENCOUNTER — Encounter: Admit: 2019-02-05 | Discharge: 2019-02-05

## 2019-02-05 NOTE — Telephone Encounter
Attempted to call [facility] to see about getting stress test done for patient there locally. Left message with nuc department and asked for call back to discuss.     Wilhemena Durie, [place] spoke with scheduling department and we need to know a physician locally that will be willing to read test.   Attempted to call [phone], left message to see if there is a local cardiologist who would read test. Asked for callback to discuss.    Once a physician is confirmed, we can call scheduling at [phone] or fax [phone]

## 2019-02-12 NOTE — Telephone Encounter
Called clinic number and left message for callback to see if there is a physician who would read the stress test results. Will fax order if they accept.

## 2019-03-02 NOTE — Telephone Encounter
Pt called again called and Lm to cb, records request sent to [facility]

## 2019-03-02 NOTE — Telephone Encounter
Message received from pt 03/01/19 at 1618 wanting to know the status of her stress test. Returned call to pt and call was disconnected after answered. Attempted to cb X 3 and line busy.

## 2019-03-02 NOTE — Telephone Encounter
Spoke with pt, she reports she had test done on 9/24 at [facility]

## 2019-04-07 ENCOUNTER — Encounter: Admit: 2019-04-07 | Discharge: 2019-04-07 | Payer: MEDICARE

## 2019-04-07 NOTE — Telephone Encounter
Pt called asking about lab results from last thursday. No results in chart or in right fax, records request sent to via [facility].

## 2019-04-08 ENCOUNTER — Encounter: Admit: 2019-04-08 | Discharge: 2019-04-08 | Payer: MEDICARE

## 2019-04-09 MED ORDER — BUMETANIDE 1 MG PO TAB
2 mg | ORAL_TABLET | Freq: Every day | ORAL | 1 refills | Status: DC
Start: 2019-04-09 — End: 2019-09-07

## 2019-06-14 ENCOUNTER — Encounter: Admit: 2019-06-14 | Discharge: 2019-06-14 | Payer: MEDICARE

## 2019-06-14 NOTE — Telephone Encounter
Received call from Baxter Flattery with community health reporting pt had said her bumex was discontinued by Dr. Adah Perl. Returned call to [phone]. Returned call and confirmed bumex dosing. Nurse will fill pts med box accordingly.

## 2019-06-16 ENCOUNTER — Encounter: Admit: 2019-06-16 | Discharge: 2019-06-16 | Payer: MEDICARE

## 2019-06-16 NOTE — Telephone Encounter
Call from PCP office regarding care.Patient has problems with encephalopathy and they are preparing pill boxes for her.  Please contact after ov.  RN name is Amy.  Caller name was [place].  Information being fax'd.

## 2019-06-16 NOTE — Telephone Encounter
Received call from Opal with community health in [place] asking that information from 1/28 OV be sent to them after pt leaves [place]. Jacqlyn Larsen reports they are setting up pts pill boxes and would like an update after pt is seen.     Phone [phone]  Fax [phone]

## 2019-06-29 ENCOUNTER — Encounter: Admit: 2019-06-29 | Discharge: 2019-06-29 | Payer: MEDICARE

## 2019-06-29 NOTE — Telephone Encounter
Patient care giver stated need to cancel upcoming appointment and no need to reschedule. Patient is now in a long term care facility.

## 2019-07-01 ENCOUNTER — Encounter: Admit: 2019-07-01 | Discharge: 2019-07-01 | Payer: MEDICARE

## 2019-09-06 ENCOUNTER — Encounter: Admit: 2019-09-06 | Discharge: 2019-09-06 | Payer: MEDICARE

## 2019-09-06 NOTE — Telephone Encounter
Pt called reporting she has been in a nursing home for a couple of months and asks if she should still come to OV scheduled tomorrow.called [facility] and spoke with nurse who reported pt had a ride form the facility to appt tomorrow. pts facility will be providing this transportation.

## 2019-09-07 ENCOUNTER — Encounter: Admit: 2019-09-07 | Discharge: 2019-09-07 | Payer: MEDICARE

## 2019-09-07 ENCOUNTER — Ambulatory Visit: Admit: 2019-09-07 | Discharge: 2019-09-08 | Payer: MEDICARE

## 2019-09-07 DIAGNOSIS — E119 Type 2 diabetes mellitus without complications: Secondary | ICD-10-CM

## 2019-09-07 DIAGNOSIS — I5032 Chronic diastolic (congestive) heart failure: Secondary | ICD-10-CM

## 2019-09-07 DIAGNOSIS — M549 Dorsalgia, unspecified: Secondary | ICD-10-CM

## 2019-09-07 DIAGNOSIS — I2510 Atherosclerotic heart disease of native coronary artery without angina pectoris: Secondary | ICD-10-CM

## 2019-09-07 DIAGNOSIS — M1990 Unspecified osteoarthritis, unspecified site: Secondary | ICD-10-CM

## 2019-09-07 DIAGNOSIS — I10 Essential (primary) hypertension: Secondary | ICD-10-CM

## 2019-09-07 DIAGNOSIS — D699 Hemorrhagic condition, unspecified: Secondary | ICD-10-CM

## 2019-09-07 DIAGNOSIS — E039 Hypothyroidism, unspecified: Secondary | ICD-10-CM

## 2019-09-07 DIAGNOSIS — H547 Unspecified visual loss: Secondary | ICD-10-CM

## 2019-09-07 DIAGNOSIS — K319 Disease of stomach and duodenum, unspecified: Secondary | ICD-10-CM

## 2019-09-07 MED ORDER — BUMETANIDE 1 MG PO TAB
1 mg | ORAL_TABLET | Freq: Every day | ORAL | 1 refills | Status: AC
Start: 2019-09-07 — End: ?

## 2019-09-07 MED ORDER — EPLERENONE 25 MG PO TAB
25 mg | ORAL_TABLET | Freq: Every day | ORAL | 3 refills | 90.00000 days | Status: AC
Start: 2019-09-07 — End: ?

## 2019-09-07 NOTE — Progress Notes
Date of Service: 09/07/2019    Diane Savage is a 64 y.o. female.   She is followed by Dr. Pierre Bali.    HPI     Her past medical history significant for heart failure with preserved ejection fraction, ischemic cardiomyopathy, coronary artery disease s/p CABG (2018), paroxysmal atrial fibrillation, hypertension, hyperlipidemia, hypothyroidism, chronic kidney disease, diabetes mellitus, GAVE/cirrhosis with transfusion-dependent anemia, barrettes esophagus, and obesity.    Echocardiogram in [place] on 02/01/2019 showed an EF of 55 to 65% with no wall motion abnormalities and pseudo-normal LV filling pattern, grade 2 diastolic dysfunction.  She has moderate to severe left atrial enlargement and a PASP estimated to be 35 to 40 mmHg with normal IVC variation with respiration and normal central venous pressure.  There was no significant valvular disease identified.  RV function was normal as was RV size.    She was last seen by Dr. Pierre Bali via telehealth on 02/03/2019.  At that visit she was euvolemic and normotensive.  Her metolazone was discontinued and she was to remain on her current dose of Bumex.  She had reported chest pain and a stress test was ordered for her in [place], [place].  She did complete her stress test in the fall 2020 and it was negative for ischemia.    She is currently residing at [facility].    She returns today for follow-up.  She reports having shortness of breath with exertion.  Her weight has been stable around 208 pounds.  She does have some lower extremity edema but believes it is improved from before.  She has continued to have intermittent chest pain, sometimes sharp, the last episode was approximately a month ago..  She is using 2 pillows at night for comfort. She denies paroxysmal nocturnal dyspnea, early satiety, abdominal distention, irregular heartbeat/palpitations, dizziness, and/or syncope/near syncope.      She reports falling 1-2 months ago and injuring her hands.  She is continue to have hand pain, recommend she follow-up with her primary provider.        Vitals:    09/07/19 1103   BP: 130/68   BP Source: Arm, Right Upper   Patient Position: Sitting   Pulse: 92   SpO2: 97%   Weight: 94.3 kg (208 lb)   Height: 1.626 m (5' 4)   PainSc: Nine     Body mass index is 35.7 kg/m?Marland Kitchen     Past Medical History  Patient Active Problem List    Diagnosis Date Noted   ? Hypertensive heart disease with chronic diastolic congestive heart failure (HCC) 09/07/2019   ? CKD  09/18/2017   ? Morbid obesity (HCC) 05/20/2017   ? Acute on chronic diastolic heart failure due to coronary artery disease (HCC) 03/19/2017   ? Chronic diastolic heart failure (HCC) 03/19/2017   ? Barrett's esophagus 03/18/2017   ? Left leg cellulitis 03/17/2017   ? Pleural effusion on left 03/14/2017   ? Essential hypertension 02/11/2017   ? AKI (acute kidney injury) (HCC) 02/06/2017   ? CAD (coronary artery disease), native coronary artery 02/04/2017     02/04/17: CABG x4 (LIMA to the LAD, reversed SVG in sequence to the OM & left posterolateral arteries, and reversed SVG anastomosed to the posterior descending branch of the RCA). Bilateral modified CryoMaze procedure (pulmonary vein isolation). Suture ligation of left atrial appendage.     ? HLD (hyperlipidemia) 02/04/2017   ? Hypothyroidism 02/04/2017   ? Hepatitis B 02/04/2017   ? Obstructive pattern present on  pulmonary function testing 02/04/2017   ? Thrombocytopenia (HCC) 02/04/2017   ? 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 Type 2 diabetes mellitus with circulatory disorder, without long-term current use of insulin (HCC) 01/31/2017   ? PAF (paroxysmal atrial fibrillation) (HCC) 01/31/2017     02/04/17: Bilateral modified CryoMaze procedure (pulmonary vein isolation) & Suture ligation of left atrial appendage at time of CABG.     ? Chronic gastrointestinal bleeding 01/31/2017   ? Transfusion-dependent anemia 01/31/2017   ? Absolute anemia 11/01/2016     Added automatically from request for surgery [id number]       Review of Systems   Constitution: Negative.   HENT: Negative.    Eyes: Negative.    Cardiovascular: Positive for chest pain, dyspnea on exertion and leg swelling.   Respiratory: Negative.    Endocrine: Negative.    Skin: Negative.    Musculoskeletal: Negative.         Bilateral hand pain   Gastrointestinal: Negative.    Genitourinary: Negative.    Neurological: Negative.    Psychiatric/Behavioral: Negative.    Allergic/Immunologic: Negative.        Physical Exam  Constitutional: No acute distress  HENT:  Head - normocephalic   Eyes: Conjunctivae normal  Neck:7 JVP, no HJR  Cardiac Rhythm: Normal rate, regular rhythm  Cardiac Ausculation:  S1/S2 normal, no definitive S3 or S4, no gallop or friction rub  Murmurs: No murmur heard  Radial Arteries: Normal symmetric radial pulses   Lower Extremity Edema: No lower extremity edema  Pulmonary/Chest: Breathing comfortably, no respiratory distress, lungs clear to ausculation, no rales / rhonchi / wheezing  Abdominal: Soft, non-tender, no obvious masses, bowel sounds present  Musculoskeletal: Moves all extremities, sitting in a wheelchair  Skin: Warm / dry, no erythema   Neurological: Alert and oriented to person, place, and time; no focal deficits  Psychiatric: Normal mood and affect; judgment, behavior, and thought content normal  Language and Memory: patient responsive and seems to comprehend information   Vital signs reviewed      Problems Addressed Today  Encounter Diagnoses   Name Primary?   ? Chronic diastolic heart failure (HCC) Yes   ? Coronary artery disease involving native coronary artery of native heart without angina pectoris    ? Hypertensive heart disease with chronic diastolic congestive heart failure (HCC)    ? CKD     ? Ischemic cardiomyopathy        Assessment and Plan     Chronic Heart Failure with Preserved Ejection Fraction:    -Echo on on 02/01/2020 was 55-65% (echo completed at [facility] in [place])  -GDMT: Bumex 1 mg daily (reports nausea with spironolactone)  -Today, she describes NYHA Functional Class III symptoms, appears euvolemic by exam.  Her weight is stable at 208 pounds.  We will trial her on eplerenone for GDMT optimization, 25 mg daily.  She will have a BMP checked in 1 week.  -Reinforced 2 g sodium diet and 2 L fluid restriction.  -Follow-up with Dr. Pierre Bali in 3-6 months.  She did discuss potentially wanting to change cardiologist to have someone closer.  Understand her wanting to have someone closer, informed her that would be her choice and if so we would follow-up as directed by the new cardiologist.    Ischemic/ Cardiomyopathy:    -GDMT as described above    Coronary Artery Disease:  -She has a history of coronary artery disease status post CABG.  She underwent a stress test  in [place] on 02/25/2019.  The study was negative for ischemia, normal LV function and no wall motion abnormalities.  -She has continued to have intermittent chest pain, sometimes sharp, the last episode was approximately a month ago.  Reinforced the use of her nitroglycerin.    Hypertension:  -Controlled on current regimen, continue to monitor after the initiation of Inspra.    Chronic Kidney Disease - Stage IV:  -Most recent creatinine on 04/01/19 was 2.41. Will recheck after starting Inspra.      Follow Up Visit:  3-6 months with Dr Pierre Bali (unless you decide to find a cardiologist that is closer and then we will see you as directed by the new cardiologist)       I have personally documented the HPI, exam and medical decision making.  Patient education: I reviewed recent lab results and current medications, medication instructions, discussed heart failure signs & symptoms,  low  sodium diet, fluid restriction and daily weights. I have instructed the patient on the plan of care and they verbalize understanding of the plan. Please see AVS for full patient teaching. Patient advised to call our office if s/he has any problems, questions, worsening symptoms, or concerns prior to the next appointment.       Total time 30 minutes.  Estimated counseling time 20 minutes.     Thank you for allowing me to participate in the care of this patient. If you have any questions please do not hesitate to contact our office.      Justus Memory, DNP, APRN, NP-C  Collaborating Physician: Dr. Earnest Bailey  [facility] [place] [facility]  Phone: [phone] Fax: [phone]         Current Medications (including today's revisions)  ? bumetanide (BUMEX) 1 mg tablet Take one tablet by mouth daily.   ? cetirizine (ZYRTEC) 10 mg tablet Take 10 mg by mouth every morning.   ? clobetasol (TEMOVATE) 0.05 % topical cream Apply to affected area twice daily as needed   ? diphenhydrAMINE (BENADRYL ALLERGY) 25 mg tablet Take 25 mg by mouth every 6 hours as needed for Sleep.   ? eplerenone (INSPRA) 25 mg tab Take one tablet by mouth daily.   ? ergocalciferol (VITAMIN D-2) 1,250 mcg (50,000 unit) capsule Take 1 capsule by mouth every 7 days.   ? folic acid (FOLVITE) 1 mg tablet Take 1 mg by mouth daily.   ? glucosam-chondroit-C-manganese 500-400-2-0.33 mg cap Take 500 mg by mouth twice daily.   ? insulin aspart U-100 (NOVOLOG) 100 unit/mL injection Inject four Units under the skin three times daily with meals.   ? insulin NPH (HUMULIN N NPH U-100 INSULIN) 100 unit/mL injection Inject fourteen Units under the skin every morning. (Patient taking differently: Inject 15 Units under the skin every morning.)   ? Insulin Syringe-Needle U-100 (BD INSULIN SYRINGE ULTRA-FINE) 0.3 mL 31 gauge x 5/16 syrg Use four times daily with Insulin   ? LACTULOSE 10 gram/15 mL oral solution Take 15 mL by mouth daily.   ? levothyroxine (SYNTHROID) 125 mcg tablet Take 125 mcg by mouth daily 30 minutes before breakfast.   ? magnesium oxide (MAG-OX) 400 mg (241.3 mg magnesium) tablet TAKE ONE (1) TABLET BY MOUTH TWICE DAILY   ? metoclopramide (REGLAN) 10 mg tablet Take 1 tablet by mouth twice daily as needed.   ? nitrofurantoin macrocrystaL (MACRODANTIN) 100 mg capsule Take 100 mg by mouth twice daily. Take with food.   ?  nitroglycerin (NITROSTAT) 0.4 mg tablet Place 0.4 mg under tongue every 5 minutes as needed for Chest Pain. Max of 3 tablets, call 911.   ? omeprazole DR(+) (PRILOSEC) 40 mg capsule Take 40 mg by mouth twice daily.   ? tiZANidine (ZANAFLEX) 2 mg tablet Take 1 tablet by mouth three times daily as needed.   ? traMADol (ULTRAM) 50 mg tablet Take one tablet by mouth every 6 hours as needed for Pain.

## 2019-09-07 NOTE — Telephone Encounter
Diane Savage with [facility] would like to schedule pt 6 mo f/u appt... phone [phone]

## 2019-09-08 DIAGNOSIS — I110 Hypertensive heart disease with heart failure: Secondary | ICD-10-CM

## 2019-09-08 DIAGNOSIS — I2510 Atherosclerotic heart disease of native coronary artery without angina pectoris: Secondary | ICD-10-CM

## 2019-09-08 DIAGNOSIS — I5032 Chronic diastolic (congestive) heart failure: Secondary | ICD-10-CM

## 2019-09-08 DIAGNOSIS — I255 Ischemic cardiomyopathy: Secondary | ICD-10-CM

## 2019-09-08 DIAGNOSIS — N184 Chronic kidney disease, stage 4 (severe): Secondary | ICD-10-CM

## 2019-09-16 ENCOUNTER — Encounter: Admit: 2019-09-16 | Discharge: 2019-09-16 | Payer: MEDICARE

## 2019-09-16 ENCOUNTER — Ambulatory Visit: Admit: 2019-09-16 | Discharge: 2019-09-16 | Payer: MEDICARE

## 2019-09-16 NOTE — Progress Notes
Verbal consent to discuss care with June Leap received from Alean Rinne and Quitman Livings.

## 2020-01-20 ENCOUNTER — Encounter: Admit: 2020-01-20 | Discharge: 2020-01-20 | Payer: MEDICARE

## 2020-03-02 IMAGING — MG MAMMO BILATERAL SCREENING
8 of 13 series · 8 of 29 positions shown · non-contrast
Comparison: none

MAMMO BILATERAL SCREENING
INDICATION: Routine screening.  

 No prior mammograms are available for comparison.  
 2-D and 3-D bilateral screening mammography was performed.  
 The current study was also evaluated with a Computer Aided  
 Detection (CAD) system. 3-D tomosynthesis was also performed and  
 reviewed.

[R MLO (1 of 2)]
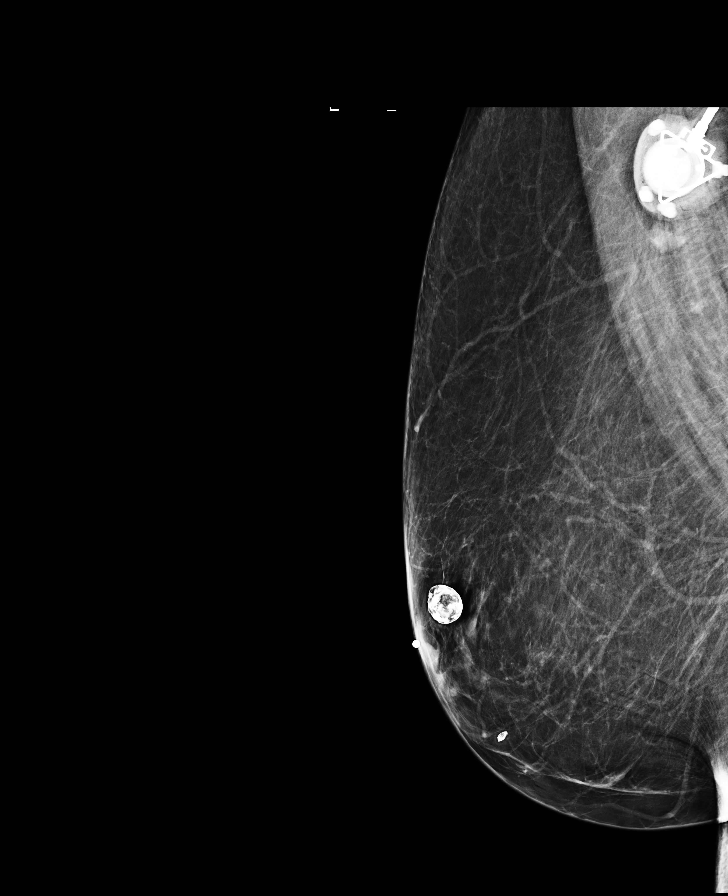

[R MLO (2 of 2)]
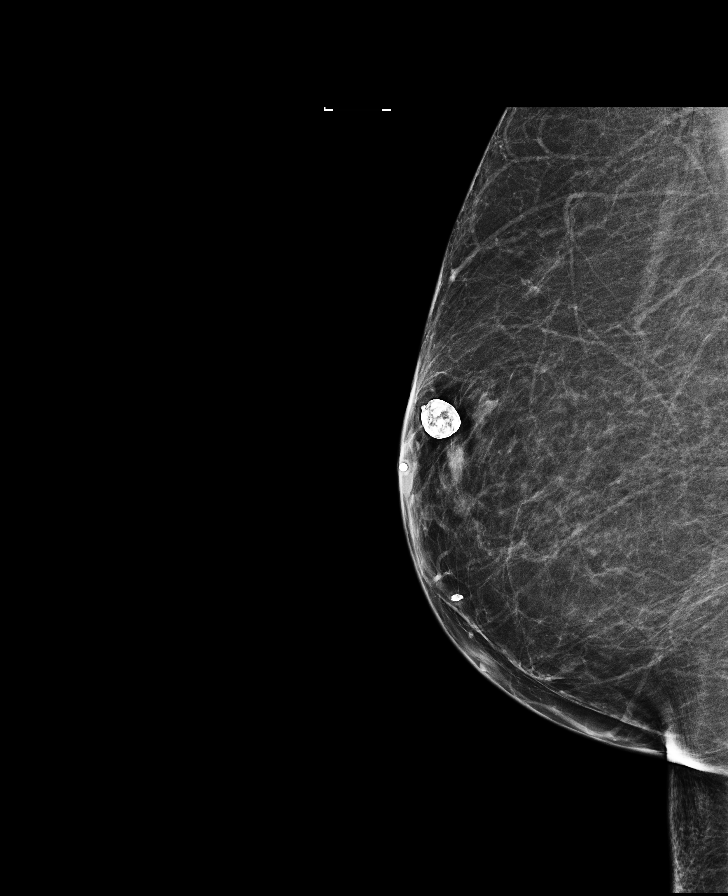

[R CC synth-2D]
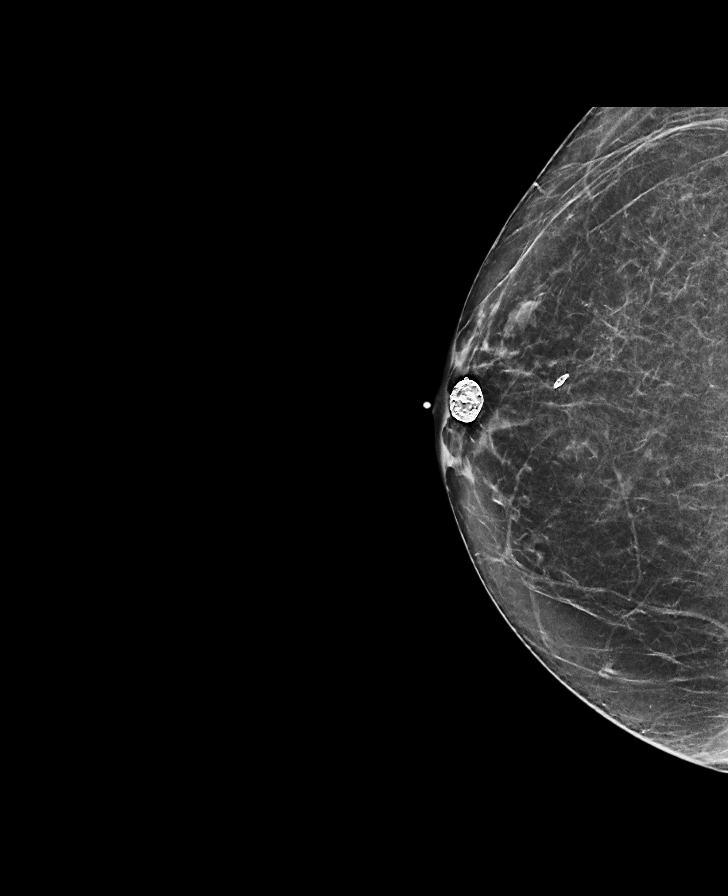

[R CC]
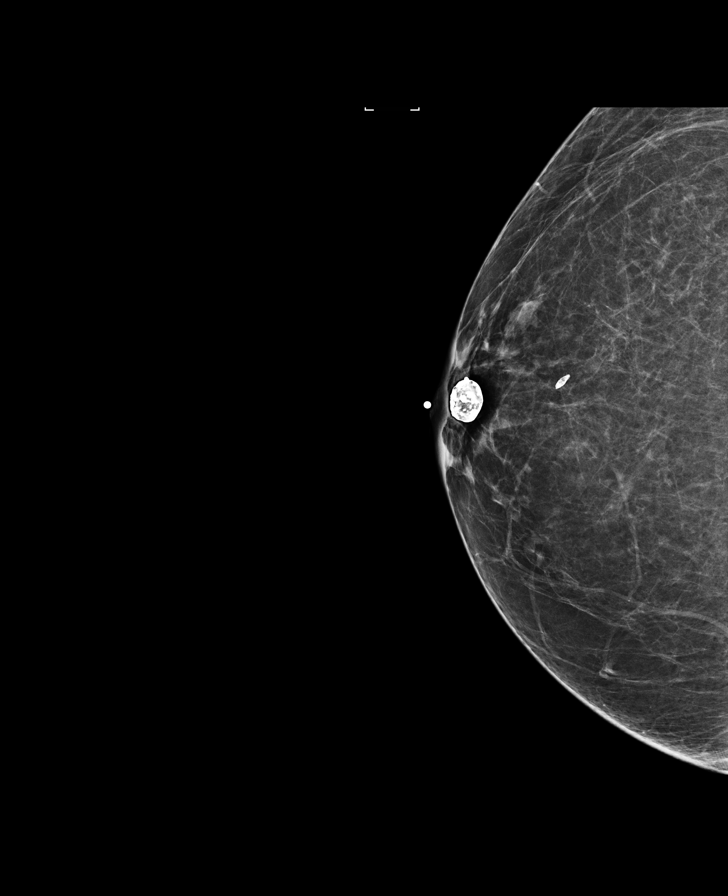

[R MLO synth-2D]
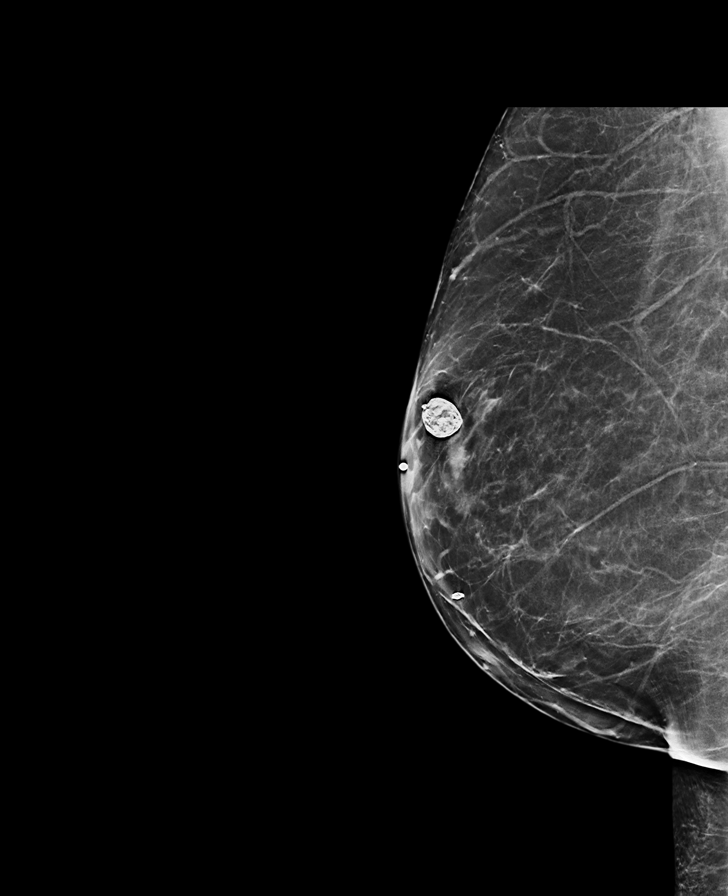

[L MLO]
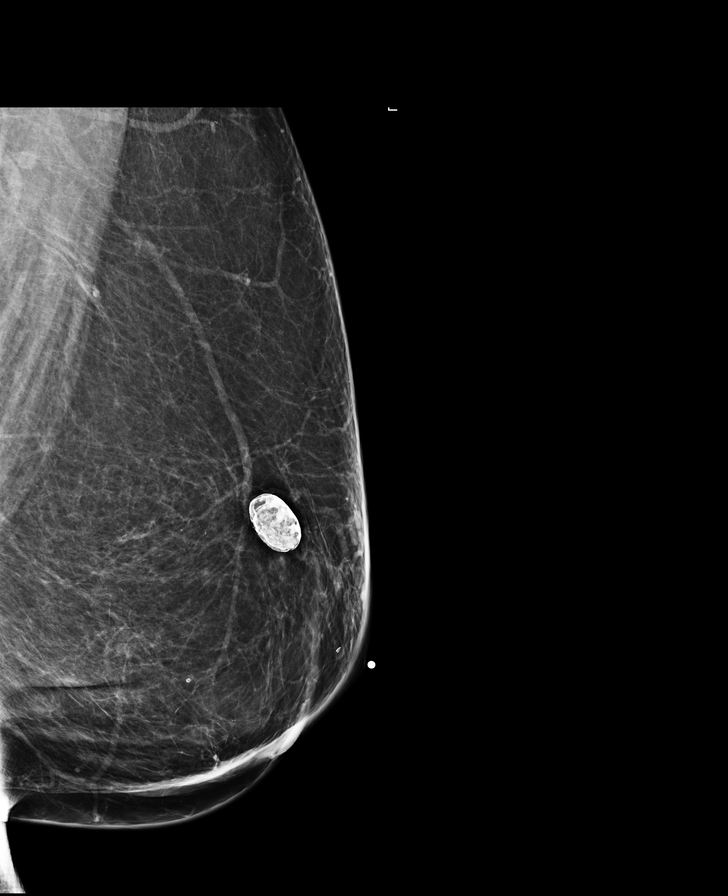

[L MLO synth-2D]
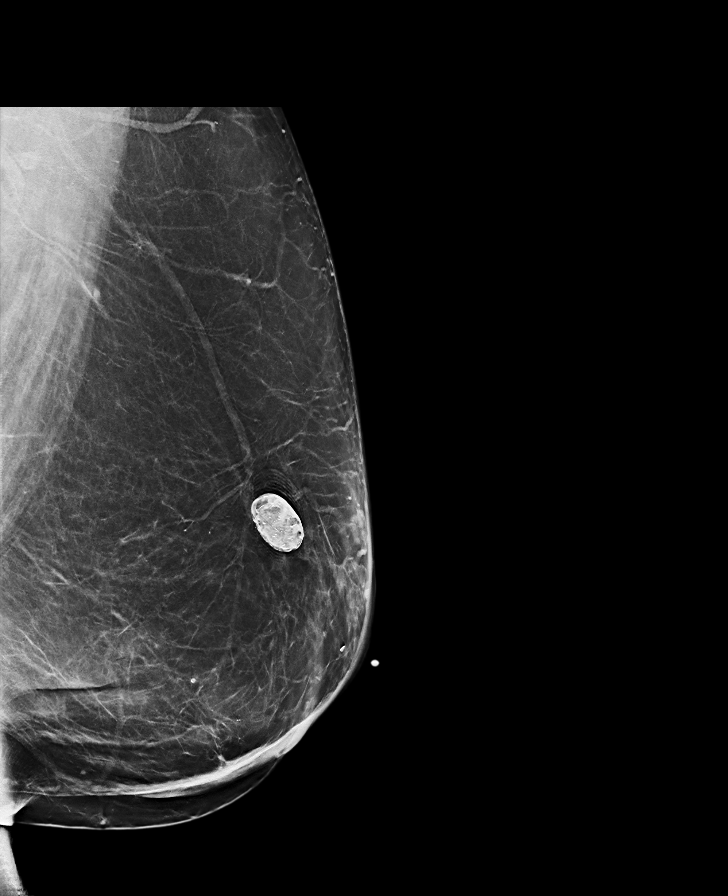

[L CC synth-2D]
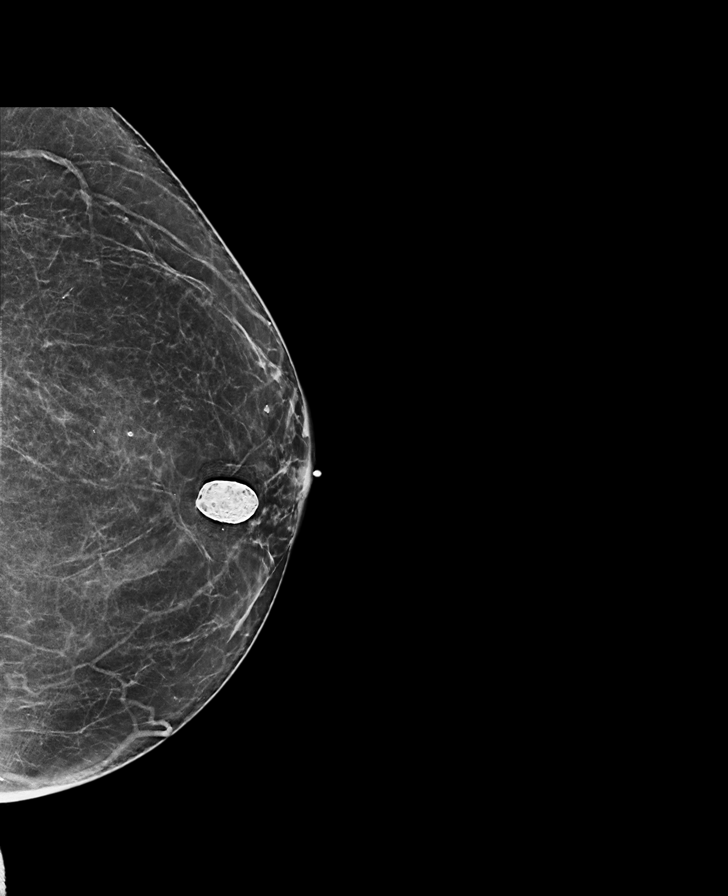

[8 of 29 positions shown; findings below may reference images not displayed]

FINDINGS: Scattered fibroglandular densities are identified  

 bilaterally. There are benign calcifications in both breasts. No  

 spiculated mass or malignant-appearing microcalcifications are  

 seen. Axillae demonstrate an Infuse-a-Port hub in the right  

 axilla.
IMPRESSION: No mammographic features suspicious for malignancy  

 are identified.  

 ACR BI-RADS Category 2: Benign findings.  

 Result letter will be mailed to the patient.  

 Note: At least 10% of breast cancer is not imaged by mammography.  

MK [id number]

## 2022-07-17 ENCOUNTER — Encounter: Admit: 2022-07-17 | Discharge: 2022-07-17 | Payer: MEDICARE

## 2022-07-29 ENCOUNTER — Encounter: Admit: 2022-07-29 | Discharge: 2022-07-29 | Payer: MEDICARE

## 2022-07-29 NOTE — Telephone Encounter
LVM for the patient to return call to schedule follow up appointment.
